# Patient Record
Sex: Female | Born: 1950 | Race: Black or African American | Hispanic: No | Smoking: Former smoker
Health system: Southern US, Community
[De-identification: ages and names within clinical notes are randomized; demographics above are authoritative.]

## PROBLEM LIST (undated history)

## (undated) DIAGNOSIS — N950 Postmenopausal bleeding: Secondary | ICD-10-CM

## (undated) DIAGNOSIS — I639 Cerebral infarction, unspecified: Secondary | ICD-10-CM

## (undated) DIAGNOSIS — E1065 Type 1 diabetes mellitus with hyperglycemia: Secondary | ICD-10-CM

## (undated) DIAGNOSIS — G510 Bell's palsy: Secondary | ICD-10-CM

## (undated) DIAGNOSIS — I10 Essential (primary) hypertension: Secondary | ICD-10-CM

## (undated) DIAGNOSIS — C801 Malignant (primary) neoplasm, unspecified: Secondary | ICD-10-CM

## (undated) DIAGNOSIS — Z21 Asymptomatic human immunodeficiency virus [HIV] infection status: Secondary | ICD-10-CM

## (undated) DIAGNOSIS — F419 Anxiety disorder, unspecified: Secondary | ICD-10-CM

## (undated) DIAGNOSIS — M109 Gout, unspecified: Secondary | ICD-10-CM

## (undated) DIAGNOSIS — K219 Gastro-esophageal reflux disease without esophagitis: Secondary | ICD-10-CM

## (undated) DIAGNOSIS — E063 Autoimmune thyroiditis: Secondary | ICD-10-CM

## (undated) DIAGNOSIS — Z6841 Body Mass Index (BMI) 40.0 and over, adult: Secondary | ICD-10-CM

## (undated) DIAGNOSIS — B20 Human immunodeficiency virus [HIV] disease: Secondary | ICD-10-CM

## (undated) DIAGNOSIS — M1990 Unspecified osteoarthritis, unspecified site: Secondary | ICD-10-CM

## (undated) DIAGNOSIS — E785 Hyperlipidemia, unspecified: Secondary | ICD-10-CM

## (undated) HISTORY — DX: Body Mass Index (BMI) 40.0 and over, adult: Z684

## (undated) HISTORY — DX: Hyperlipidemia, unspecified: E78.5

## (undated) HISTORY — DX: Asymptomatic human immunodeficiency virus (hiv) infection status: Z21

## (undated) HISTORY — DX: Morbid (severe) obesity due to excess calories: E66.01

## (undated) HISTORY — DX: Autoimmune thyroiditis: E06.3

## (undated) HISTORY — DX: Malignant (primary) neoplasm, unspecified: C80.1

## (undated) HISTORY — DX: Essential (primary) hypertension: I10

## (undated) HISTORY — DX: Cerebral infarction, unspecified: I63.9

## (undated) HISTORY — DX: Human immunodeficiency virus (HIV) disease: B20

## (undated) HISTORY — DX: Postmenopausal bleeding: N95.0

## (undated) HISTORY — DX: Type 1 diabetes mellitus with hyperglycemia: E10.65

---

## 1997-12-13 ENCOUNTER — Encounter: Admission: RE | Admit: 1997-12-13 | Discharge: 1997-12-13 | Payer: Self-pay | Admitting: Hematology and Oncology

## 1998-01-26 ENCOUNTER — Encounter: Admission: RE | Admit: 1998-01-26 | Discharge: 1998-01-26 | Payer: Self-pay | Admitting: Internal Medicine

## 1998-03-13 ENCOUNTER — Encounter: Admission: RE | Admit: 1998-03-13 | Discharge: 1998-03-13 | Payer: Self-pay | Admitting: Internal Medicine

## 1998-03-27 ENCOUNTER — Encounter: Admission: RE | Admit: 1998-03-27 | Discharge: 1998-03-27 | Payer: Self-pay | Admitting: Internal Medicine

## 1998-06-27 ENCOUNTER — Encounter: Admission: RE | Admit: 1998-06-27 | Discharge: 1998-06-27 | Payer: Self-pay | Admitting: Internal Medicine

## 1998-06-27 ENCOUNTER — Other Ambulatory Visit: Admission: RE | Admit: 1998-06-27 | Discharge: 1998-06-27 | Payer: Self-pay | Admitting: *Deleted

## 1998-06-27 ENCOUNTER — Ambulatory Visit (HOSPITAL_COMMUNITY): Admission: RE | Admit: 1998-06-27 | Discharge: 1998-06-27 | Payer: Self-pay | Admitting: Internal Medicine

## 1998-08-25 HISTORY — PX: COLONOSCOPY: SHX174

## 1998-11-14 ENCOUNTER — Encounter: Admission: RE | Admit: 1998-11-14 | Discharge: 1998-11-14 | Payer: Self-pay | Admitting: Hematology and Oncology

## 1998-11-14 ENCOUNTER — Encounter (INDEPENDENT_AMBULATORY_CARE_PROVIDER_SITE_OTHER): Payer: Self-pay | Admitting: *Deleted

## 1998-11-14 ENCOUNTER — Ambulatory Visit (HOSPITAL_COMMUNITY): Admission: RE | Admit: 1998-11-14 | Discharge: 1998-11-14 | Payer: Self-pay | Admitting: Hematology and Oncology

## 1998-11-14 LAB — CONVERTED CEMR LAB: CD4 Count: 250 microliters

## 1999-01-30 ENCOUNTER — Encounter: Admission: RE | Admit: 1999-01-30 | Discharge: 1999-01-30 | Payer: Self-pay | Admitting: Internal Medicine

## 1999-03-14 ENCOUNTER — Encounter: Admission: RE | Admit: 1999-03-14 | Discharge: 1999-03-14 | Payer: Self-pay | Admitting: Hematology and Oncology

## 1999-03-14 ENCOUNTER — Ambulatory Visit (HOSPITAL_COMMUNITY): Admission: RE | Admit: 1999-03-14 | Discharge: 1999-03-14 | Payer: Self-pay | Admitting: Hematology and Oncology

## 1999-03-14 ENCOUNTER — Other Ambulatory Visit: Admission: RE | Admit: 1999-03-14 | Discharge: 1999-03-14 | Payer: Self-pay | Admitting: Obstetrics

## 1999-03-28 ENCOUNTER — Encounter: Admission: RE | Admit: 1999-03-28 | Discharge: 1999-03-28 | Payer: Self-pay | Admitting: Hematology and Oncology

## 1999-04-09 ENCOUNTER — Encounter: Admission: RE | Admit: 1999-04-09 | Discharge: 1999-04-09 | Payer: Self-pay | Admitting: Internal Medicine

## 1999-05-02 ENCOUNTER — Encounter: Admission: RE | Admit: 1999-05-02 | Discharge: 1999-05-02 | Payer: Self-pay | Admitting: Internal Medicine

## 1999-06-20 ENCOUNTER — Encounter: Admission: RE | Admit: 1999-06-20 | Discharge: 1999-06-20 | Payer: Self-pay | Admitting: Obstetrics

## 1999-07-30 ENCOUNTER — Encounter: Admission: RE | Admit: 1999-07-30 | Discharge: 1999-07-30 | Payer: Self-pay | Admitting: Internal Medicine

## 1999-07-30 ENCOUNTER — Ambulatory Visit (HOSPITAL_COMMUNITY): Admission: RE | Admit: 1999-07-30 | Discharge: 1999-07-30 | Payer: Self-pay | Admitting: Internal Medicine

## 1999-08-15 ENCOUNTER — Other Ambulatory Visit: Admission: RE | Admit: 1999-08-15 | Discharge: 1999-08-15 | Payer: Self-pay | Admitting: Obstetrics

## 1999-08-15 ENCOUNTER — Encounter: Admission: RE | Admit: 1999-08-15 | Discharge: 1999-08-15 | Payer: Self-pay | Admitting: Obstetrics

## 1999-09-06 ENCOUNTER — Encounter: Admission: RE | Admit: 1999-09-06 | Discharge: 1999-09-06 | Payer: Self-pay | Admitting: Internal Medicine

## 1999-09-18 ENCOUNTER — Encounter: Admission: RE | Admit: 1999-09-18 | Discharge: 1999-09-18 | Payer: Self-pay | Admitting: Internal Medicine

## 1999-11-05 ENCOUNTER — Encounter: Admission: RE | Admit: 1999-11-05 | Discharge: 1999-11-05 | Payer: Self-pay | Admitting: Internal Medicine

## 2000-01-21 ENCOUNTER — Encounter: Admission: RE | Admit: 2000-01-21 | Discharge: 2000-01-21 | Payer: Self-pay | Admitting: Internal Medicine

## 2000-01-21 ENCOUNTER — Ambulatory Visit (HOSPITAL_COMMUNITY): Admission: RE | Admit: 2000-01-21 | Discharge: 2000-01-21 | Payer: Self-pay | Admitting: Internal Medicine

## 2000-01-23 ENCOUNTER — Encounter: Admission: RE | Admit: 2000-01-23 | Discharge: 2000-01-23 | Payer: Self-pay | Admitting: Internal Medicine

## 2000-01-29 ENCOUNTER — Encounter: Payer: Self-pay | Admitting: Gastroenterology

## 2000-01-29 ENCOUNTER — Ambulatory Visit (HOSPITAL_COMMUNITY): Admission: RE | Admit: 2000-01-29 | Discharge: 2000-01-29 | Payer: Self-pay | Admitting: Gastroenterology

## 2000-01-30 ENCOUNTER — Ambulatory Visit (HOSPITAL_COMMUNITY): Admission: RE | Admit: 2000-01-30 | Discharge: 2000-01-30 | Payer: Self-pay | Admitting: Internal Medicine

## 2000-01-30 ENCOUNTER — Encounter: Admission: RE | Admit: 2000-01-30 | Discharge: 2000-01-30 | Payer: Self-pay | Admitting: Internal Medicine

## 2000-02-05 ENCOUNTER — Encounter: Admission: RE | Admit: 2000-02-05 | Discharge: 2000-02-05 | Payer: Self-pay | Admitting: Hematology and Oncology

## 2000-02-18 ENCOUNTER — Encounter: Admission: RE | Admit: 2000-02-18 | Discharge: 2000-02-18 | Payer: Self-pay | Admitting: Internal Medicine

## 2000-03-06 ENCOUNTER — Encounter: Admission: RE | Admit: 2000-03-06 | Discharge: 2000-03-06 | Payer: Self-pay | Admitting: Internal Medicine

## 2000-03-09 ENCOUNTER — Encounter: Admission: RE | Admit: 2000-03-09 | Discharge: 2000-03-09 | Payer: Self-pay | Admitting: Infectious Diseases

## 2000-03-10 ENCOUNTER — Ambulatory Visit (HOSPITAL_COMMUNITY): Admission: RE | Admit: 2000-03-10 | Discharge: 2000-03-10 | Payer: Self-pay | Admitting: Gastroenterology

## 2000-03-10 ENCOUNTER — Encounter: Payer: Self-pay | Admitting: Gastroenterology

## 2000-04-13 ENCOUNTER — Encounter: Admission: RE | Admit: 2000-04-13 | Discharge: 2000-04-13 | Payer: Self-pay | Admitting: Infectious Diseases

## 2000-04-17 ENCOUNTER — Encounter: Admission: RE | Admit: 2000-04-17 | Discharge: 2000-04-17 | Payer: Self-pay | Admitting: Internal Medicine

## 2000-05-15 ENCOUNTER — Encounter: Admission: RE | Admit: 2000-05-15 | Discharge: 2000-05-15 | Payer: Self-pay | Admitting: Internal Medicine

## 2000-05-22 ENCOUNTER — Ambulatory Visit (HOSPITAL_COMMUNITY): Admission: RE | Admit: 2000-05-22 | Discharge: 2000-05-22 | Payer: Self-pay | Admitting: Internal Medicine

## 2000-05-22 ENCOUNTER — Encounter: Admission: RE | Admit: 2000-05-22 | Discharge: 2000-05-22 | Payer: Self-pay | Admitting: Internal Medicine

## 2000-07-13 ENCOUNTER — Encounter: Admission: RE | Admit: 2000-07-13 | Discharge: 2000-07-13 | Payer: Self-pay | Admitting: Internal Medicine

## 2000-08-12 ENCOUNTER — Encounter: Admission: RE | Admit: 2000-08-12 | Discharge: 2000-08-12 | Payer: Self-pay

## 2000-08-14 ENCOUNTER — Encounter: Admission: RE | Admit: 2000-08-14 | Discharge: 2000-08-14 | Payer: Self-pay | Admitting: Internal Medicine

## 2000-09-21 ENCOUNTER — Ambulatory Visit (HOSPITAL_COMMUNITY): Admission: RE | Admit: 2000-09-21 | Discharge: 2000-09-21 | Payer: Self-pay | Admitting: Internal Medicine

## 2000-09-21 ENCOUNTER — Encounter: Admission: RE | Admit: 2000-09-21 | Discharge: 2000-09-21 | Payer: Self-pay | Admitting: Internal Medicine

## 2000-10-09 ENCOUNTER — Encounter: Admission: RE | Admit: 2000-10-09 | Discharge: 2000-10-09 | Payer: Self-pay | Admitting: Internal Medicine

## 2000-10-09 DIAGNOSIS — R87619 Unspecified abnormal cytological findings in specimens from cervix uteri: Secondary | ICD-10-CM

## 2000-10-13 ENCOUNTER — Ambulatory Visit (HOSPITAL_COMMUNITY): Admission: RE | Admit: 2000-10-13 | Discharge: 2000-10-13 | Payer: Self-pay | Admitting: *Deleted

## 2000-10-13 ENCOUNTER — Encounter: Admission: RE | Admit: 2000-10-13 | Discharge: 2000-10-13 | Payer: Self-pay

## 2000-11-24 ENCOUNTER — Encounter: Admission: RE | Admit: 2000-11-24 | Discharge: 2000-11-24 | Payer: Self-pay | Admitting: Hematology and Oncology

## 2001-09-17 ENCOUNTER — Encounter: Admission: RE | Admit: 2001-09-17 | Discharge: 2001-09-17 | Payer: Self-pay

## 2001-10-14 ENCOUNTER — Encounter: Admission: RE | Admit: 2001-10-14 | Discharge: 2001-10-14 | Payer: Self-pay | Admitting: Internal Medicine

## 2001-10-14 ENCOUNTER — Ambulatory Visit (HOSPITAL_COMMUNITY): Admission: RE | Admit: 2001-10-14 | Discharge: 2001-10-14 | Payer: Self-pay | Admitting: Internal Medicine

## 2001-11-11 ENCOUNTER — Encounter: Admission: RE | Admit: 2001-11-11 | Discharge: 2001-11-11 | Payer: Self-pay | Admitting: Internal Medicine

## 2001-12-20 ENCOUNTER — Encounter: Admission: RE | Admit: 2001-12-20 | Discharge: 2001-12-20 | Payer: Self-pay

## 2002-02-15 ENCOUNTER — Encounter: Admission: RE | Admit: 2002-02-15 | Discharge: 2002-02-15 | Payer: Self-pay | Admitting: Internal Medicine

## 2002-02-15 ENCOUNTER — Ambulatory Visit (HOSPITAL_COMMUNITY): Admission: RE | Admit: 2002-02-15 | Discharge: 2002-02-15 | Payer: Self-pay | Admitting: Internal Medicine

## 2002-04-29 ENCOUNTER — Encounter: Admission: RE | Admit: 2002-04-29 | Discharge: 2002-04-29 | Payer: Self-pay | Admitting: Internal Medicine

## 2002-04-29 ENCOUNTER — Ambulatory Visit (HOSPITAL_COMMUNITY): Admission: RE | Admit: 2002-04-29 | Discharge: 2002-04-29 | Payer: Self-pay | Admitting: Internal Medicine

## 2002-05-10 ENCOUNTER — Encounter: Payer: Self-pay | Admitting: Internal Medicine

## 2002-05-10 ENCOUNTER — Ambulatory Visit (HOSPITAL_COMMUNITY): Admission: RE | Admit: 2002-05-10 | Discharge: 2002-05-10 | Payer: Self-pay | Admitting: Internal Medicine

## 2002-05-17 ENCOUNTER — Encounter: Admission: RE | Admit: 2002-05-17 | Discharge: 2002-05-17 | Payer: Self-pay | Admitting: Internal Medicine

## 2002-06-22 ENCOUNTER — Encounter: Admission: RE | Admit: 2002-06-22 | Discharge: 2002-06-22 | Payer: Self-pay | Admitting: Internal Medicine

## 2002-07-06 ENCOUNTER — Encounter: Admission: RE | Admit: 2002-07-06 | Discharge: 2002-07-06 | Payer: Self-pay | Admitting: Internal Medicine

## 2002-07-06 ENCOUNTER — Ambulatory Visit (HOSPITAL_COMMUNITY): Admission: RE | Admit: 2002-07-06 | Discharge: 2002-07-06 | Payer: Self-pay | Admitting: Internal Medicine

## 2002-09-05 ENCOUNTER — Encounter: Admission: RE | Admit: 2002-09-05 | Discharge: 2002-09-05 | Payer: Self-pay | Admitting: Internal Medicine

## 2002-11-14 ENCOUNTER — Encounter: Admission: RE | Admit: 2002-11-14 | Discharge: 2002-11-14 | Payer: Self-pay | Admitting: Internal Medicine

## 2002-12-15 ENCOUNTER — Encounter (INDEPENDENT_AMBULATORY_CARE_PROVIDER_SITE_OTHER): Payer: Self-pay | Admitting: Specialist

## 2002-12-15 ENCOUNTER — Encounter: Admission: RE | Admit: 2002-12-15 | Discharge: 2002-12-15 | Payer: Self-pay | Admitting: Internal Medicine

## 2003-01-20 ENCOUNTER — Encounter: Admission: RE | Admit: 2003-01-20 | Discharge: 2003-01-20 | Payer: Self-pay | Admitting: Internal Medicine

## 2003-02-28 ENCOUNTER — Encounter: Admission: RE | Admit: 2003-02-28 | Discharge: 2003-02-28 | Payer: Self-pay | Admitting: Internal Medicine

## 2003-03-16 ENCOUNTER — Encounter: Admission: RE | Admit: 2003-03-16 | Discharge: 2003-03-16 | Payer: Self-pay | Admitting: Internal Medicine

## 2003-04-06 ENCOUNTER — Encounter (INDEPENDENT_AMBULATORY_CARE_PROVIDER_SITE_OTHER): Payer: Self-pay | Admitting: Internal Medicine

## 2003-04-06 ENCOUNTER — Ambulatory Visit (HOSPITAL_COMMUNITY): Admission: RE | Admit: 2003-04-06 | Discharge: 2003-04-06 | Payer: Self-pay | Admitting: Internal Medicine

## 2003-04-06 ENCOUNTER — Encounter: Admission: RE | Admit: 2003-04-06 | Discharge: 2003-04-06 | Payer: Self-pay | Admitting: Internal Medicine

## 2003-05-24 ENCOUNTER — Ambulatory Visit (HOSPITAL_COMMUNITY): Admission: RE | Admit: 2003-05-24 | Discharge: 2003-05-24 | Payer: Self-pay | Admitting: Internal Medicine

## 2003-05-24 ENCOUNTER — Encounter: Admission: RE | Admit: 2003-05-24 | Discharge: 2003-05-24 | Payer: Self-pay | Admitting: Internal Medicine

## 2003-06-12 ENCOUNTER — Encounter: Admission: RE | Admit: 2003-06-12 | Discharge: 2003-06-12 | Payer: Self-pay | Admitting: Internal Medicine

## 2003-07-11 ENCOUNTER — Encounter: Admission: RE | Admit: 2003-07-11 | Discharge: 2003-07-11 | Payer: Self-pay | Admitting: Internal Medicine

## 2003-10-30 ENCOUNTER — Ambulatory Visit (HOSPITAL_COMMUNITY): Admission: RE | Admit: 2003-10-30 | Discharge: 2003-10-30 | Payer: Self-pay | Admitting: Internal Medicine

## 2003-10-30 ENCOUNTER — Encounter: Admission: RE | Admit: 2003-10-30 | Discharge: 2003-10-30 | Payer: Self-pay | Admitting: Internal Medicine

## 2003-11-15 ENCOUNTER — Encounter: Admission: RE | Admit: 2003-11-15 | Discharge: 2003-11-15 | Payer: Self-pay | Admitting: Internal Medicine

## 2003-11-21 ENCOUNTER — Encounter: Admission: RE | Admit: 2003-11-21 | Discharge: 2003-11-21 | Payer: Self-pay | Admitting: Obstetrics and Gynecology

## 2003-11-21 ENCOUNTER — Other Ambulatory Visit: Admission: RE | Admit: 2003-11-21 | Discharge: 2003-11-21 | Payer: Self-pay | Admitting: Obstetrics and Gynecology

## 2003-11-21 ENCOUNTER — Encounter (INDEPENDENT_AMBULATORY_CARE_PROVIDER_SITE_OTHER): Payer: Self-pay | Admitting: Specialist

## 2003-11-27 ENCOUNTER — Encounter: Admission: RE | Admit: 2003-11-27 | Discharge: 2004-01-08 | Payer: Self-pay | Admitting: Internal Medicine

## 2003-12-14 ENCOUNTER — Encounter: Admission: RE | Admit: 2003-12-14 | Discharge: 2003-12-14 | Payer: Self-pay | Admitting: Internal Medicine

## 2003-12-14 ENCOUNTER — Ambulatory Visit (HOSPITAL_COMMUNITY): Admission: RE | Admit: 2003-12-14 | Discharge: 2003-12-14 | Payer: Self-pay | Admitting: Internal Medicine

## 2004-01-09 ENCOUNTER — Encounter: Admission: RE | Admit: 2004-01-09 | Discharge: 2004-01-09 | Payer: Self-pay | Admitting: Internal Medicine

## 2004-02-21 ENCOUNTER — Ambulatory Visit (HOSPITAL_COMMUNITY): Admission: RE | Admit: 2004-02-21 | Discharge: 2004-02-21 | Payer: Self-pay | Admitting: Internal Medicine

## 2004-02-21 ENCOUNTER — Encounter: Admission: RE | Admit: 2004-02-21 | Discharge: 2004-02-21 | Payer: Self-pay | Admitting: Internal Medicine

## 2004-04-18 ENCOUNTER — Encounter: Admission: RE | Admit: 2004-04-18 | Discharge: 2004-04-18 | Payer: Self-pay | Admitting: Internal Medicine

## 2004-05-08 ENCOUNTER — Encounter (INDEPENDENT_AMBULATORY_CARE_PROVIDER_SITE_OTHER): Payer: Self-pay | Admitting: *Deleted

## 2004-05-08 ENCOUNTER — Ambulatory Visit: Payer: Self-pay | Admitting: Infectious Diseases

## 2004-05-08 ENCOUNTER — Ambulatory Visit (HOSPITAL_COMMUNITY): Admission: RE | Admit: 2004-05-08 | Discharge: 2004-05-08 | Payer: Self-pay | Admitting: Infectious Diseases

## 2004-05-08 LAB — CONVERTED CEMR LAB
CD4 Count: 30 microliters
HIV 1 RNA Quant: 131000 copies/mL

## 2004-06-24 ENCOUNTER — Ambulatory Visit: Payer: Self-pay | Admitting: Infectious Diseases

## 2004-08-30 ENCOUNTER — Ambulatory Visit: Payer: Self-pay | Admitting: Gastroenterology

## 2004-09-04 ENCOUNTER — Encounter (INDEPENDENT_AMBULATORY_CARE_PROVIDER_SITE_OTHER): Payer: Self-pay | Admitting: *Deleted

## 2004-09-04 ENCOUNTER — Ambulatory Visit: Payer: Self-pay | Admitting: Gastroenterology

## 2004-09-04 ENCOUNTER — Encounter (INDEPENDENT_AMBULATORY_CARE_PROVIDER_SITE_OTHER): Payer: Self-pay | Admitting: Specialist

## 2004-09-04 ENCOUNTER — Ambulatory Visit (HOSPITAL_COMMUNITY): Admission: RE | Admit: 2004-09-04 | Discharge: 2004-09-04 | Payer: Self-pay | Admitting: Gastroenterology

## 2005-02-28 ENCOUNTER — Ambulatory Visit: Payer: Self-pay | Admitting: Internal Medicine

## 2005-03-04 ENCOUNTER — Ambulatory Visit: Payer: Self-pay | Admitting: Internal Medicine

## 2005-03-04 ENCOUNTER — Ambulatory Visit (HOSPITAL_COMMUNITY): Admission: RE | Admit: 2005-03-04 | Discharge: 2005-03-04 | Payer: Self-pay | Admitting: Internal Medicine

## 2005-04-30 ENCOUNTER — Ambulatory Visit: Payer: Self-pay | Admitting: Infectious Diseases

## 2005-05-26 ENCOUNTER — Ambulatory Visit: Payer: Self-pay | Admitting: Infectious Diseases

## 2005-05-29 ENCOUNTER — Ambulatory Visit: Payer: Self-pay | Admitting: *Deleted

## 2005-05-29 ENCOUNTER — Encounter (INDEPENDENT_AMBULATORY_CARE_PROVIDER_SITE_OTHER): Payer: Self-pay | Admitting: Specialist

## 2005-06-06 ENCOUNTER — Ambulatory Visit (HOSPITAL_COMMUNITY): Admission: RE | Admit: 2005-06-06 | Discharge: 2005-06-06 | Payer: Self-pay | Admitting: Internal Medicine

## 2005-06-18 ENCOUNTER — Encounter: Admission: RE | Admit: 2005-06-18 | Discharge: 2005-06-18 | Payer: Self-pay | Admitting: Family Medicine

## 2005-06-30 ENCOUNTER — Other Ambulatory Visit: Admission: RE | Admit: 2005-06-30 | Discharge: 2005-06-30 | Payer: Self-pay | Admitting: *Deleted

## 2005-06-30 ENCOUNTER — Encounter (INDEPENDENT_AMBULATORY_CARE_PROVIDER_SITE_OTHER): Payer: Self-pay | Admitting: Specialist

## 2005-07-01 ENCOUNTER — Ambulatory Visit: Payer: Self-pay | Admitting: *Deleted

## 2005-09-02 ENCOUNTER — Ambulatory Visit: Payer: Self-pay | Admitting: Obstetrics and Gynecology

## 2005-09-24 ENCOUNTER — Ambulatory Visit (HOSPITAL_COMMUNITY): Admission: RE | Admit: 2005-09-24 | Discharge: 2005-09-24 | Payer: Self-pay | Admitting: Obstetrics and Gynecology

## 2005-09-24 ENCOUNTER — Encounter (INDEPENDENT_AMBULATORY_CARE_PROVIDER_SITE_OTHER): Payer: Self-pay | Admitting: Specialist

## 2005-09-24 ENCOUNTER — Ambulatory Visit: Payer: Self-pay | Admitting: Obstetrics and Gynecology

## 2005-10-29 ENCOUNTER — Ambulatory Visit: Payer: Self-pay | Admitting: Obstetrics and Gynecology

## 2005-11-14 ENCOUNTER — Emergency Department (HOSPITAL_COMMUNITY): Admission: EM | Admit: 2005-11-14 | Discharge: 2005-11-15 | Payer: Self-pay | Admitting: Emergency Medicine

## 2005-11-18 ENCOUNTER — Ambulatory Visit: Payer: Self-pay | Admitting: Infectious Diseases

## 2005-12-17 ENCOUNTER — Ambulatory Visit: Payer: Self-pay | Admitting: Family Medicine

## 2006-04-22 ENCOUNTER — Encounter (INDEPENDENT_AMBULATORY_CARE_PROVIDER_SITE_OTHER): Payer: Self-pay | Admitting: Specialist

## 2006-04-22 ENCOUNTER — Ambulatory Visit: Payer: Self-pay | Admitting: Obstetrics and Gynecology

## 2006-06-11 ENCOUNTER — Ambulatory Visit (HOSPITAL_COMMUNITY): Admission: RE | Admit: 2006-06-11 | Discharge: 2006-06-11 | Payer: Self-pay | Admitting: Obstetrics and Gynecology

## 2006-09-08 DIAGNOSIS — N83209 Unspecified ovarian cyst, unspecified side: Secondary | ICD-10-CM

## 2006-09-08 DIAGNOSIS — B20 Human immunodeficiency virus [HIV] disease: Secondary | ICD-10-CM | POA: Insufficient documentation

## 2006-09-08 DIAGNOSIS — G609 Hereditary and idiopathic neuropathy, unspecified: Secondary | ICD-10-CM | POA: Insufficient documentation

## 2006-09-08 DIAGNOSIS — K210 Gastro-esophageal reflux disease with esophagitis: Secondary | ICD-10-CM

## 2006-09-08 DIAGNOSIS — R599 Enlarged lymph nodes, unspecified: Secondary | ICD-10-CM | POA: Insufficient documentation

## 2006-09-08 DIAGNOSIS — I10 Essential (primary) hypertension: Secondary | ICD-10-CM

## 2006-09-08 DIAGNOSIS — L738 Other specified follicular disorders: Secondary | ICD-10-CM | POA: Insufficient documentation

## 2006-09-08 DIAGNOSIS — N879 Dysplasia of cervix uteri, unspecified: Secondary | ICD-10-CM | POA: Insufficient documentation

## 2006-09-08 DIAGNOSIS — B373 Candidiasis of vulva and vagina: Secondary | ICD-10-CM

## 2006-09-08 DIAGNOSIS — D219 Benign neoplasm of connective and other soft tissue, unspecified: Secondary | ICD-10-CM | POA: Insufficient documentation

## 2006-09-17 ENCOUNTER — Encounter (INDEPENDENT_AMBULATORY_CARE_PROVIDER_SITE_OTHER): Payer: Self-pay | Admitting: Infectious Diseases

## 2006-10-19 ENCOUNTER — Encounter (INDEPENDENT_AMBULATORY_CARE_PROVIDER_SITE_OTHER): Payer: Self-pay | Admitting: *Deleted

## 2006-10-19 LAB — CONVERTED CEMR LAB: Pap Smear: ABNORMAL

## 2006-11-01 ENCOUNTER — Encounter (INDEPENDENT_AMBULATORY_CARE_PROVIDER_SITE_OTHER): Payer: Self-pay | Admitting: *Deleted

## 2007-01-04 ENCOUNTER — Ambulatory Visit: Payer: Self-pay | Admitting: Orthopedic Surgery

## 2007-03-24 ENCOUNTER — Ambulatory Visit: Payer: Self-pay | Admitting: Vascular Surgery

## 2007-03-24 ENCOUNTER — Ambulatory Visit (HOSPITAL_COMMUNITY): Admission: RE | Admit: 2007-03-24 | Discharge: 2007-03-24 | Payer: Self-pay | Admitting: Orthopedic Surgery

## 2007-03-24 ENCOUNTER — Encounter (INDEPENDENT_AMBULATORY_CARE_PROVIDER_SITE_OTHER): Payer: Self-pay | Admitting: Orthopedic Surgery

## 2007-04-06 ENCOUNTER — Encounter (INDEPENDENT_AMBULATORY_CARE_PROVIDER_SITE_OTHER): Payer: Self-pay | Admitting: Orthopedic Surgery

## 2007-04-06 ENCOUNTER — Ambulatory Visit (HOSPITAL_COMMUNITY): Admission: RE | Admit: 2007-04-06 | Discharge: 2007-04-06 | Payer: Self-pay | Admitting: Orthopedic Surgery

## 2007-04-19 ENCOUNTER — Encounter (HOSPITAL_COMMUNITY): Admission: RE | Admit: 2007-04-19 | Discharge: 2007-05-19 | Payer: Self-pay | Admitting: Orthopedic Surgery

## 2007-06-14 ENCOUNTER — Ambulatory Visit (HOSPITAL_COMMUNITY): Admission: RE | Admit: 2007-06-14 | Discharge: 2007-06-14 | Payer: Self-pay | Admitting: *Deleted

## 2007-06-24 ENCOUNTER — Ambulatory Visit: Payer: Self-pay | Admitting: Obstetrics & Gynecology

## 2007-06-24 ENCOUNTER — Encounter: Payer: Self-pay | Admitting: Obstetrics & Gynecology

## 2007-06-30 ENCOUNTER — Ambulatory Visit: Payer: Self-pay | Admitting: Obstetrics and Gynecology

## 2007-06-30 ENCOUNTER — Ambulatory Visit (HOSPITAL_COMMUNITY): Admission: RE | Admit: 2007-06-30 | Discharge: 2007-06-30 | Payer: Self-pay | Admitting: Family Medicine

## 2007-07-05 ENCOUNTER — Ambulatory Visit (HOSPITAL_COMMUNITY): Admission: RE | Admit: 2007-07-05 | Discharge: 2007-07-05 | Payer: Self-pay | Admitting: Obstetrics and Gynecology

## 2007-07-05 ENCOUNTER — Ambulatory Visit: Payer: Self-pay | Admitting: Obstetrics and Gynecology

## 2007-07-05 ENCOUNTER — Encounter: Payer: Self-pay | Admitting: Obstetrics and Gynecology

## 2007-07-14 ENCOUNTER — Ambulatory Visit: Payer: Self-pay | Admitting: *Deleted

## 2007-07-14 ENCOUNTER — Ambulatory Visit: Admission: RE | Admit: 2007-07-14 | Discharge: 2007-07-14 | Payer: Self-pay | Admitting: Gynecologic Oncology

## 2007-08-10 ENCOUNTER — Ambulatory Visit (HOSPITAL_COMMUNITY): Admission: RE | Admit: 2007-08-10 | Discharge: 2007-08-10 | Payer: Self-pay | Admitting: Gynecology

## 2007-08-10 ENCOUNTER — Encounter: Payer: Self-pay | Admitting: Gynecology

## 2007-09-09 ENCOUNTER — Ambulatory Visit: Payer: Self-pay | Admitting: Family Medicine

## 2007-10-12 ENCOUNTER — Ambulatory Visit: Admission: RE | Admit: 2007-10-12 | Discharge: 2007-10-12 | Payer: Self-pay | Admitting: Gynecologic Oncology

## 2007-11-03 ENCOUNTER — Ambulatory Visit: Payer: Self-pay | Admitting: Gynecology

## 2007-11-03 ENCOUNTER — Other Ambulatory Visit: Admission: RE | Admit: 2007-11-03 | Discharge: 2007-11-03 | Payer: Self-pay | Admitting: Obstetrics & Gynecology

## 2007-11-17 ENCOUNTER — Ambulatory Visit: Payer: Self-pay | Admitting: Obstetrics & Gynecology

## 2007-11-19 ENCOUNTER — Ambulatory Visit (HOSPITAL_COMMUNITY): Admission: RE | Admit: 2007-11-19 | Discharge: 2007-11-19 | Payer: Self-pay | Admitting: Obstetrics & Gynecology

## 2008-02-03 ENCOUNTER — Ambulatory Visit: Payer: Self-pay | Admitting: Gynecology

## 2008-02-09 ENCOUNTER — Ambulatory Visit: Admission: RE | Admit: 2008-02-09 | Discharge: 2008-02-09 | Payer: Self-pay | Admitting: Gynecologic Oncology

## 2008-03-23 ENCOUNTER — Telehealth (INDEPENDENT_AMBULATORY_CARE_PROVIDER_SITE_OTHER): Payer: Self-pay | Admitting: *Deleted

## 2008-06-21 ENCOUNTER — Ambulatory Visit: Payer: Self-pay | Admitting: Obstetrics & Gynecology

## 2008-06-21 ENCOUNTER — Encounter (INDEPENDENT_AMBULATORY_CARE_PROVIDER_SITE_OTHER): Payer: Self-pay | Admitting: Obstetrics & Gynecology

## 2008-07-05 ENCOUNTER — Ambulatory Visit (HOSPITAL_COMMUNITY): Admission: RE | Admit: 2008-07-05 | Discharge: 2008-07-05 | Payer: Self-pay | Admitting: Family Medicine

## 2008-08-02 ENCOUNTER — Other Ambulatory Visit: Admission: RE | Admit: 2008-08-02 | Discharge: 2008-08-02 | Payer: Self-pay | Admitting: Obstetrics and Gynecology

## 2008-08-02 ENCOUNTER — Ambulatory Visit: Payer: Self-pay | Admitting: Obstetrics & Gynecology

## 2008-08-09 ENCOUNTER — Ambulatory Visit: Admission: RE | Admit: 2008-08-09 | Discharge: 2008-08-09 | Payer: Self-pay | Admitting: Gynecologic Oncology

## 2009-03-01 ENCOUNTER — Ambulatory Visit: Admission: RE | Admit: 2009-03-01 | Discharge: 2009-03-01 | Payer: Self-pay | Admitting: Gynecologic Oncology

## 2009-03-01 ENCOUNTER — Encounter: Payer: Self-pay | Admitting: Gynecologic Oncology

## 2009-03-14 ENCOUNTER — Ambulatory Visit: Payer: Self-pay | Admitting: Obstetrics and Gynecology

## 2009-03-14 ENCOUNTER — Other Ambulatory Visit: Admission: RE | Admit: 2009-03-14 | Discharge: 2009-03-14 | Payer: Self-pay | Admitting: Obstetrics and Gynecology

## 2009-03-14 ENCOUNTER — Encounter: Payer: Self-pay | Admitting: Obstetrics and Gynecology

## 2009-03-28 ENCOUNTER — Ambulatory Visit: Payer: Self-pay | Admitting: Obstetrics & Gynecology

## 2009-04-12 ENCOUNTER — Ambulatory Visit: Payer: Self-pay | Admitting: Obstetrics and Gynecology

## 2010-03-01 ENCOUNTER — Ambulatory Visit: Payer: Self-pay | Admitting: Cardiology

## 2010-03-28 ENCOUNTER — Ambulatory Visit: Admission: RE | Admit: 2010-03-28 | Discharge: 2010-03-28 | Payer: Self-pay | Admitting: Gynecologic Oncology

## 2010-09-14 ENCOUNTER — Encounter: Payer: Self-pay | Admitting: Gastroenterology

## 2010-09-15 ENCOUNTER — Encounter: Payer: Self-pay | Admitting: Family Medicine

## 2011-01-07 NOTE — Consult Note (Signed)
NAME:  Laura Jimenez, Laura Jimenez              ACCOUNT NO.:  [id number]   MEDICAL RECORD NO.:  [id number]          PATIENT TYPE:  WOC   LOCATION:  WOC                          FACILITY:  [facility]   PHYSICIAN:  Paola A. Duard Brady, MD    DATE OF BIRTH:  [birth year]   DATE OF CONSULTATION:  10/12/2007  DATE OF DISCHARGE:  09/09/2007                                 CONSULTATION   HISTORY OF PRESENT ILLNESS:  [name] is a very pleasant 60 year old  who was referred to [name] for recurrent vulvar lesion.  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  She underwent a wide local excision by  Dr. Okey Dupre in November 2008 which revealed multifocal stromal  microinvasive disease with positive lateral margins of the deepest  extent being 0.1 cm.  There is no lymphovascular space involvement.  The  lateral margins were free.  We initially saw her in November 2008.  Her  other medical problems include morbid obesity, HIV, low grade Pap  smears, postmenopausal bleeding for which she had a D&C at the time of  her wide local excision that was negative, cutaneous lupus  erythematosus, carcinoma in situ of the peritoneum.  She is otherwise  doing quite well and denies any current complaints.   REVIEW OF SYSTEMS:  She denies any chest pain short of breath, vaginal  bleeding, change in bowel or bladder habits.  She had complaints of  lower extremity edema and her medications are being evaluated and  adjusted.  She apparently, though we have no report, did have a low  grade Pap smear again.  She has a colposcopy scheduled with Dr. Okey Dupre in  the near future.   ALLERGIES:  Keflex, penicillin which causes itching and swelling,  adhesive tape which causes swelling.   MEDICATIONS:  Norvasc, hydrochlorothiazide, Valtrex, Norvir and  Prezista.   PHYSICAL EXAMINATION:  GENERAL:  Well-nourished, alert female in no  acute distress.  NECK:  Supple with no lymphadenopathy, no thyromegaly.  LUNGS:  Clear to auscultation  bilaterally.  CARDIOVASCULAR:  Regular rate and rhythm.  ABDOMEN:  Obese, soft, nontender, nondistended.  No palpable masses or  positive megaly.  Groins are negative for adenopathy.  EXTREMITIES:  She has 2+ pitting edema on the dorsum of her feet equal  bilaterally.  There is no more superior edema than that.  PELVIC:  External genitalia is notable for surgical excision of the  right vulva.  There is a small erythematous rim which is most consistent  with scar and some granulation tissue from her prior superficial  separation.  There is postoperative changes on the right vulva.  There  is no obvious evidence of recurrent disease.  Bimanual examination of  the cervix is palpably normal, though exam is limited by habitus.  The  corpus does not appear markedly enlarged.   ASSESSMENT:  This is a 60 year old with HIV positive with a stage I  vulvar cancer with no evidence of recurrent disease.  She has, however,  had abnormal Pap smears and has a colposcopy scheduled with Dr. Okey Dupre.   PLAN:  1. Her daughter, Laura Jimenez,  saw the lesion today that we are looking at on      a small area of erythema.  She will keep an eye on this for a week      or so and if it appears to be expanding or if there is any      bleeding, they are to contact us immediately.  2. She will follow up with her primary care physicians regarding her      lower extremity edema.  She will return to see [name] in 3-4 months.      She was given an appointment for follow-up.  She was given a card      with followup appointment for June.      Paola A. Duard Brady, MD  Electronically Signed     PAG/MEDQ  D:  10/12/2007  T:  10/13/2007  Job:  [id number]   cc:   Michele Mcalpine D. Okey Dupre, M.D.   Telford Nab, R.N.  [address]  [place], [place] [address]   Dr. Richrd Prime

## 2011-01-07 NOTE — Group Therapy Note (Signed)
NAME:  WILSON, SAMPLE NO.:  [id number]   MEDICAL RECORD NO.:  [id number]          PATIENT TYPE:  WOC   LOCATION:  [facility]                   FACILITY:  [facility]   PHYSICIAN:  Allie Bossier, MD        DATE OF BIRTH:  1950-12-05   DATE OF SERVICE:  06/24/2007                                  CLINIC NOTE   [name] is a 60 year old widowed black female gravida 3, para 2,  abortus 1 who had a diagnosis of HIV.  She was treated with a wide local  excision in January 2007, for a recurring ulcerative vulvar lesion.  This came back as carcinoma in situ with clear margins.  She was suppose  to visit every three months.  This did not happen exactly in that  fashion. Today, she has a complaint of postmenopausal bleeding for the  last few weeks.  Prior to this bleeding episode, she has been  amenorrheic for the last six years.   PAST MEDICAL HISTORY:  1. Morbid obesity.  2. HIV.  3. 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  4. Postmenopausal bleeding.  5. Cutaneous lupus erythematosus.  6. 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   ALLERGIES:  KEFLEX, PENICILLIN, no latex allergies.   SOCIAL HISTORY:  Negative for tobacco, alcohol or drug use.   FAMILY HISTORY:  Negative for breast, GYN and colon malignancies.   REVIEW OF SYSTEMS:  She is abstinent.  She reports that her mammogram  this month was negative.   MEDICATIONS:  1. Norvasc 10 mg.  2. Hydrochlorothiazide daily.  3. Valtrex daily.  4. Norvir b.i.d.  5. Sentress 400 mg b.i.d.  6. Prezista 600 mg p.o. b.i.d.   PHYSICAL EXAM:  GENERAL APPEARANCE:  Obese lady in no apparent distress.  VITAL SIGNS:  Blood pressure 159/84, weight 230 pounds, height 5 feet 5.  HEENT:  Her skin of her face has a somewhat reddish discoloration, but  is not consistent with the rest of her skin coloring. BREASTS:  Large  pendulous breasts without masses.  ABDOMINAL:  She has large pannus with no hepatosplenomegaly appreciated.  EXTERNAL  GENITALIA:  She has a fungating lesion from the right side of  her vulva.  This is almost certainly cancer.  Her cervix appears normal  and a Pap smear was obtained.   ASSESSMENT/PLAN:  1. Annual exam.  I have checked a Pap smear and will get the results      of her mammogram.  2. Postmenopausal bleeding.  I scheduled the GYN ultrasound.  3. Fungating vulvar lesion.  I spoke with the office of Dr. De Blanch, who recommended biopsy as soon as possible prior to      her appointment there.  Her appointment there will be scheduled.      She refuses a biopsy today and is adamant that she will come back      when she is given some Ativan pre-biopsy.  I have agreed to this      and written her a prescription for Valium and expect that she will  be here within the next week for a vulvar biopsy.  All questions      answered.      Allie Bossier, MD     MCD/MEDQ  D:  06/24/2007  T:  06/25/2007  Job:  [id number]

## 2011-01-07 NOTE — Op Note (Signed)
NAME:  Laura Jimenez, Laura Jimenez NO.:  [id number]   MEDICAL RECORD NO.:  [id number]          PATIENT TYPE:  AMB   LOCATION:  DAY                          FACILITY:  [facility]   PHYSICIAN:  De Blanch, M.D.DATE OF BIRTH:  22-Apr-1951   DATE OF PROCEDURE:  08/10/2007  DATE OF DISCHARGE:                               OPERATIVE REPORT   PREOPERATIVE DIAGNOSIS:  Stage I vulvar carcinoma, perineal lesion.   POSTOPERATIVE DIAGNOSIS:  Stage I vulvar carcinoma, perineal lesion.   PROCEDURE:  Modified posterior and right radical vulvectomy, excision of  perineal lesion.   SURGEON:  De Blanch, M.D.   ASSISTANT:  Telford Nab, R.N.   ANESTHESIA:  General with endotracheal tube.   DISPOSITION OF THE SPECIMEN:  To the lab.   SURGICAL FINDINGS:  Examination under anesthesia revealed a 1 cm white  lesion on the perineum.  The area of prior excisional biopsy that was  near the introitus, there were few sutures remaining. There was an  ulcerated area at the introitus at 6 o'clock as well as a nodular area  slightly more towards 9 o'clock.  The excision extended just to the  right of the urethra in order to achieve a surgical margin.  Rectal exam  was performed during the procedure and make sure that no rectal injury  had occurred.   DESCRIPTION OF PROCEDURE:  The patient was brought to the operating  room. After satisfactory attainment of general anesthesia, was placed in  a modified lithotomy position in [place] stirrups.  The perineum, vagina,  and vulva were prepped with Betadine and the patient was draped.  Acetic  acid was placed on the vulva.  The white area of the perineum was  identified and excised, removing a small amount of subcutaneous fat.  This was closed with interrupted 3-0 Vicryl sutures.  Attention was then  turned to the larger invasive lesion at the introitus.  An incision was  made across the perineum and approximately 2/3 of the way up the  right  labia minor.  This extended medially to the lateral aspect of the  urethra and then down the distal vagina across the midline, again,  connecting with perineal incision just to the left of the introitus.  Once the extent of the excision was outlined, cautery was used to  undermine the tissue achieving a modified radical vulvectomy.  The  specimen was oriented on cork.  During the procedure, when the  dissection was performed overlying the rectum, a rectal finger was used  to identify the rectum and make certain there was no injury.  Hemostasis  in the bulbocavernosus and pudendal artery was achieved with interrupted  figure-of-eight sutures of 2-0 Vicryl.  The distal vagina was brought  down to approximate with the perineum just above the anus and the mucosa  and submucosa closed with interrupted 2-0 Vicryl sutures.  The remainder  of the  vulva re-connected to the distal vagina with interrupted 2-0 Vicryl  sutures.  At the conclusion of the surgery, the urethra was again  identified.  The patient was awakened from anesthesia and taken to  the  recovery room in satisfactory condition.  Sponge, needle and instrument  counts were correct x2.      De Blanch, M.D.  Electronically Signed     DC/MEDQ  D:  08/10/2007  T:  08/10/2007  Job:  [id number]   cc:   Allie Bossier, MD

## 2011-01-07 NOTE — Group Therapy Note (Signed)
NAME:  Laura Jimenez, REDMANN NO.:  [id number]   MEDICAL RECORD NO.:  [id number]          PATIENT TYPE:  WOC   LOCATION[facility]  [facility]                   FACILITY:  [facility]   PHYSICIAN:  Argentina Donovan, MD        DATE OF BIRTH:  10-19-1950   DATE OF SERVICE:  03/14/2009                                  CLINIC NOTE   The patient is a 60 year old African American female who is in because  of episode of postmenopausal bleeding following 5 years of menopause.  She is certainly at high risk for endometrial hyperplasia, weighing 245  pounds and is 5 feet 5 inches tall and a blood pressure 174/96.  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  She also had an atypical Pap smear in October 2009 and  had a negative colposcopy and biopsy.  Today, we did a repeat Pap smear  and an endometrial biopsy, where the uterus was sounded to 8 cm and this  was done without incident.  The patient returned in 2 weeks for results  of the Pap smear and biopsy.   IMPRESSION:  Postmenopausal bleeding and previous atypical Pap smear.           ______________________________  Argentina Donovan, MD     PR/MEDQ  D:  03/14/2009  T:  03/15/2009  Job:  [id number]

## 2011-01-07 NOTE — Group Therapy Note (Signed)
NAME:  Laura Jimenez, NOFSINGER NO.:  [id number]   MEDICAL RECORD NO.:  [id number]          PATIENT TYPE:  WOC   LOCATION:  [facility]                   FACILITY:  [facility]   PHYSICIAN:  Tinnie Gens, MD        DATE OF BIRTH:  Feb 21, 1951   DATE OF SERVICE:  09/09/2007                                  CLINIC NOTE   CHIEF COMPLAINT:  Followup.   HISTORY OF PRESENT ILLNESS:  The patient is a 60 year old, African-  American female with HIV disease, who has a history of abnormal Pap and  squamous cell carcinoma of the vulva.  She underwent a wide local  excision of this in December.  Her pathology is reviewed and does not  show frank cancer, but early superficial invasion cannot be entirely  excluded.  I do not see any lymph nodes on this biopsy.  She also has a  history of low-grade SIL on her last Pap and this has not been followed  up with colposcopy.  The patient reports that she is feeling well from  her previous surgery.  She has a followup appointment with Dr. Sandra Cockayne-  Pearson next week.   PHYSICAL EXAMINATION:  VITAL SIGNS:  As noted in the chart.  GENERAL:  She is a moderately obese female in no acute distress.  ABDOMEN:  Soft, nontender, and nondistended.   IMPRESSION:  1. Squamous cell carcinoma of the vulva.  2. Human immunodeficiency virus (HIV) disease.  3. Abnormal Pap smear.   PLAN:  1. Follow up with Dr. Darlyn Read and more followup per his      request.  2. For her low-grade SIL, we will schedule a colposcopy in March of      2009 and will continue Pap smears based on whatever is found there.           ______________________________  Tinnie Gens, MD     TP/MEDQ  D:  09/09/2007  T:  09/09/2007  Job:  [id number]

## 2011-01-07 NOTE — Op Note (Signed)
NAME:  Laura Jimenez, Laura Jimenez              ACCOUNT NO.:  [id number]   MEDICAL RECORD NO.:  [id number]          PATIENT TYPE:  AMB   LOCATION:  [facility]                           [facility]   PHYSICIAN:  Phil D. Okey Dupre, M.D.     DATE OF BIRTH:  [birth year]   DATE OF PROCEDURE:  07/05/2007  DATE OF DISCHARGE:                               OPERATIVE REPORT   PROCEDURES:  1. Dilatation and curettage.  2. Excisional biopsy of vaginal lesion.   PREOPERATIVE DIAGNOSES:  1. Postmenopausal bleeding.  2. Vaginal lesion.   POSTOPERATIVE DIAGNOSES:  1. Postmenopausal bleeding.  2. Vaginal lesion.   SURGEON:  Javier Glazier. Okey Dupre, M.D.   ANESTHESIA:  General.   POSTOPERATIVE CONDITION:  Satisfactory.   BLOOD LOSS:  Approximately 20 mL.   PATHOLOGY SPECIMENS:  Endocervical curettings, endometrial curettings  and excised vaginal lesion.   OPERATIVE FINDINGS:  The patient was unable to be examined in the office  because of the tenderness of this lesion, which appeared to be on the  labia minora.  Once she was asleep and we could examine her, the lesion  appeared to be just at the introitus of the vagina and mainly inside of  the vagina just at the cutaneous junction.  It measured approximately 2  cm cephalad and caudally and 1 cm wide with a significantly irregular  border and several tiny satellite lesions appearing to be about the same  consistency just between the top portion of the lesion, which started  just at the fourchette at the introitus and extended up almost to the  urethra on the right side of the patient.   PROCEDURE:  Under satisfactory general anesthesia, the patient in dorsal  lithotomy position, the perineum and vagina were prepped and draped in  the usual sterile manner.  Bimanual pelvic examination under anesthesia  revealed a tight introitus with the uterus and adnexa could not be well  palpated.  A weighted speculum placed in the posterior fourchette of the  vagina and the anterior  lip of the cervix grasped with a single-tooth  tenaculum, uterine cavity sounded to a depth of 7 cm.  Endocervical  curettage was carried out with a small serrated curette after the cervix  was dilated to a #6 Hegar dilator.  That was sent separately and an  endometrial curettage was then carried out.  Once this was done, 1%  Xylocaine with 1:100,000 epinephrine approximately 10 mL was injected  below the cutaneous portion of the lesion described above and to be  excised, and it was excised with a 15 blade scalpel and the  incision site closed with a continuous running 2-0 Vicryl suture on an  atraumatic needle.  The area was dry at the end of the procedure.  The  urethra was catheterized with a red straight catheter to make sure we  did not put any sutures in that area, and the urine was clear amber at  the end the procedure.      Phil D. Okey Dupre, M.D.  Electronically Signed     PDR/MEDQ  D:  07/05/2007  T:  07/06/2007  Job:  [id number]

## 2011-01-07 NOTE — Group Therapy Note (Signed)
NAME:  Laura Jimenez, Laura Jimenez NO.:  [id number]   MEDICAL RECORD NO.:  [id number]          PATIENT TYPE:  WOC   LOCATION:  [facility]                   FACILITY:  [facility]   PHYSICIAN:  Ginger Carne, MD DATE OF BIRTH:  1951-03-14   DATE OF SERVICE:  02/03/2008                                  CLINIC NOTE   This patient is a 60 year old African American female, multiparous, who  has a history of well differentiated squamous cell carcinoma with  keratinization and a stromal microinvasion of the vulva who was treated  by Dr. Darlyn Read in January of 2009 with wide local excision.  She has had postmenopausal bleeding at that time which demonstrated  endometrial curettings of a benign polyp without neoplasia or  hyperplasia.  The patient returns today because of concern of swelling  in her vulva and wanted to be certain that there was no recurrence.  She  also has a history of HIV, morbid obesity.   SALIENT PHYSICAL FINDINGS:  The patient demonstrates no lesions that are  visible in the vulva, fourchette, perineum or frenulum.  The vagina is  noted to have no visible lesions.   IMPRESSION:  That of normal findings of the vulva and perineum.   PLAN:  The patient was reassured and she is scheduled for follow-up  every 6 months through the [facility] for follow-up of her vulvar  carcinoma.           ______________________________  Ginger Carne, MD     SHB/MEDQ  D:  02/03/2008  T:  02/03/2008  Job:  [id number]

## 2011-01-07 NOTE — Group Therapy Note (Signed)
NAME:  Laura Jimenez, Laura Jimenez NO.:  [id number]   MEDICAL RECORD NO.:  [id number]          PATIENT TYPE:  WOC   LOCATION[facility]  [facility]                   [facility]ACILITY:  [facility]   PHYSICIAN:  Jaynie Collins, MD     DATE OF BIRTH:  1950/11/20   DATE OF SERVICE:  03/28/2009                                  CLINIC NOTE   The patient is a 60 year old gravida 3, para 2-0-1-2 with a history of  HIV and vulvar carcinoma in situ status post excision with negative  margins and persistent cervical dysplasia who was seen on March 14, 2009,  with one episode of postmenopausal bleeding.  At that visit, she  underwent a repeat Pap smear and endometrial biopsy and she is here to  follow up results.  She denies any further postmenopausal bleeding.   Physical exam is deferred.   PATHOLOGY:  Endometrial biopsy showed superficial fragments of benign  secretory endometrium and benign endocervical glands and mucosa with  squamous metaplasia, no atypia, no dysplasia or malignancy identified.  The Pap smear showed low-grade squamous intraepithelial lesion.   ASSESSMENT AND PLAN:  The patient is a 60 year old female with history  of human immunodeficiency virus, vulvar carcinoma in situ status post  excision, and persistent cervical dysplasia.  The patient has had  persistent LSIL Paps for several years which has been followed with  colposcopy and many of our colposcopies have been benign; however, she  continues to have these low-grade squamous intraepithelial Pap smears.  Given that her cervical dysplasia is persistent, the patient was  counseled regarding doing repeat colposcopy or undergoing an excisional  procedure in the form of a LEEP.  The patient is declining the LEEP at  this visit and also declines a colposcopy for now.  She does not want  any further procedures done, as she says that she puts her fate in God  and she feels like further procedures may lead to cancer.  I tried to  explain to  the patient that having cervical dysplasia and HIV and  especially persistent cervical dysplasia that this can progress to more  concerning cervical intraepithelial neoplasia or cancer and that  excisional procedure is definitely an option for her.  The patient is  not agreeing to this at this point.  She wants to talk to Dr. Argentina Donovan who she has seen more times in this clinic for second opinion about  management of this persistent cervical dysplasia.  The patient was also  given the option of just the colposcopy again, given her abnormal Pap  smear, but she also refuses at this point until she talks to Dr. Okey Dupre.  She says that if Dr. Okey Dupre has the same recommendation that she will be  amendable to watching the LEEP video at her next visit and also may be  undergoing a further procedure, but she does want to get a second  opinion and she does not want to go undergone any unnecessary  procedures.  The patient will be booked for a followup, appointment  possible LEEP evaluation of further cervical dysplasia, evaluation with  Dr. Okey Dupre.  In  the meantime, she was told to call or return to clinic if  she has any further postmenopausal bleeding or any other gynecologic  concerns.           ______________________________  Jaynie Collins, MD     UA/MEDQ  D:  03/28/2009  T:  03/29/2009  Job:  [id number]

## 2011-01-07 NOTE — Consult Note (Signed)
NAME:  Laura, Jimenez              ACCOUNT NO.:  [id number]   MEDICAL RECORD NO.:  [id number]          PATIENT TYPE:  WOC   LOCATION:  [facility]                          FACILITY:  [facility]   PHYSICIAN:  Paola A. Duard Brady, MD    DATE OF BIRTH:  04/27/1951   DATE OF CONSULTATION:  02/09/2008  DATE OF DISCHARGE:                                 CONSULTATION   HISTORY OF PRESENT ILLNESS:  [name] is a very pleasant 60 year old  morbidly obese female with medical issues including obesity, HIV,  abnormal Pap smears, postmenopausal bleeding who had a carcinoma in situ  of the vulva.  She underwent wide local excision by Dr. Okey Dupre in November  2008 which revealed multifocal stromal microinvasive disease with  positive lateral margin with the deepest  extent being less than 0.1with  the deepest extent of the lesion being no more than 1 mm.  She was  dispositioned to close follow-up.  When we last saw her in February, she  had an area of granulation tissue from her superficial separation on the  right side.  She comes in today for follow-up.  She states that she did  see Dr. Okey Dupre in February for follow-up Pap smear and colposcopy.  She  states everything was all right.  Last week she had a soreness in her  vulva.  She was seen at the clinic, and they said that she had a  negative examination.  She thinks that she irritate her self with some  new deodorant infused tissue paper that she had used.  These symptoms  have cleared.  She otherwise denies any complaints.   ALLERGIES:  KEFLEX, PENICILLIN, ADHESIVE TAPE.   MEDICATIONS:  Norvasc, hydrochlorothiazide, Valtrex, Norvir and  Prezista.   PHYSICAL EXAMINATION:  VITAL SIGNS:  Weight 234 pounds which is up to 6  pounds her last visit.  Well-nourished, well-developed female in no  acute distress.  NECK:  Supple with no lymphadenopathy, no thyromegaly.  LUNGS:  Clear to auscultation bilaterally.  CARDIOVASCULAR:  Regular rate and rhythm.  ABDOMEN:   Obese, soft and nontender.  Groins are negative for  adenopathy.  EXTREMITIES:  External genitalia is notable for surgical excision of the  right vulva.  There is no obvious visible lesions other than the  postoperative changes.  The vulva is examined in its entirety.  There  are no new lesions.  Upon palpation, the vulvar and vaginal tissues were  normal.   ASSESSMENT:  This is a 60 year old HIV positive female with stage I  vulvar carcinoma who has no evidence of recurrent disease.   PLAN:  1. Follow up on those records of her Pap smears with Dr. Okey Dupre.  She      has an appointment with Dr. Okey Dupre in September and will return to      see [name] in December.  2. She was shown her vulva with a mirror, and will continue to do      vulvar self-exams on a regular basis.      Paola A. Duard Brady, MD  Electronically Signed     PAG/MEDQ  D:  02/09/2008  T:  02/09/2008  Job:  [id number]   cc:   Javier Glazier. Okey Dupre, M.D.   Telford Nab, R.N.  [address]  [place], [place] [address]   Dr. Richrd Prime

## 2011-01-07 NOTE — Group Therapy Note (Signed)
NAME:  Laura Jimenez, Laura Jimenez NO.:  [id number]   MEDICAL RECORD NO.:  [id number]          PATIENT TYPE:  WOC   LOCATION:  [facility]                   FACILITY:  [facility]   PHYSICIAN:  Argentina Donovan, MD        DATE OF BIRTH:  07/19/1951   DATE OF SERVICE:                                  CLINIC NOTE   This is a 60 year old widowed black female gravida 3, para 2-0-1-2 with  HIV.  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  She  is supposed to come for a visit every 3 months.  This did not happen.  She came in 1 week ago and has a fungating lesion on the right side of  the labia minora just at the introitus of the vagina measuring about 1-  1/2 x 1 cm.  She would not agree to biopsy last week because she was too  nervous. She was given some Valium. Came back today. Still unable to  safely do a biopsy with the patient moving around and afraid of the  procedure. In addition she was sent for an ultrasound because of  postmenopausal bleeding which they were unable to a vaginal probe  because of this lesion, was just too tender.  I am going to schedule the  patient for a D&C an excisional vulvar biopsy in the hospital, it would  be much easier on her and give her some medication for her cramping that  she is having up until then.  Will give her some Darvocet N 100 #30.           ______________________________  Argentina Donovan, MD     PR/MEDQ  D:  06/30/2007  T:  07/01/2007  Job:  [id number]

## 2011-01-07 NOTE — Consult Note (Signed)
NAME:  Laura Jimenez, Laura Jimenez NO.:  [id number]   MEDICAL RECORD NO.:  [id number]          PATIENT TYPE:  WOC   LOCATION:  [facility]                          FACILITY:  [facility]   PHYSICIAN:  Paola A. Duard Brady, MD    DATE OF BIRTH:  Nov 13, 1950   DATE OF CONSULTATION:  07/14/2007  DATE OF DISCHARGE:                                 CONSULTATION   REFERRING PHYSICIAN:  Myra C. Marice Potter, M.D.   The patient is seen today in consultation at the request of Dr. Marice Potter.  [name] is a 60 year old gravida 3, para 2, aborta 1 with a diagnosis  of HIV dating back to 7.  Fortunately her viral load for the past 5-6  months has been nondetectable.  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  She was supposed to have close follow-up  every 3 months but was lost to follow-up due to noncompliance.  She was  seen by Dr. Marice Potter June 24, 2007.  At that time she was complaining of  postmenopausal bleeding for several weeks.  Prior to that she had been  amenorrheic for about 6 years.  At the time of her exam on October 30,  the patient was noted have a fungating lesion on the right side of her  vulva.  The patient refused biopsy and subsequently underwent exam under  anesthesia and biopsies in the operating room by Dr. Kristen Loader on  July 05, 2007.  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  The  deepest extent was 0.1 cm.  It was classified as a T1a Nx Mx.  There was  no lymphovascular space involvement noted but the lateral margins were  free.  She did have an endometrial curettage which revealed a benign  endometrial polyp with no hyperplasia or malignancy, endocervical  curettage was negative.  She comes in today for evaluation of the above.  Since that time  she has continued to have some lower abdominal crampy  pain.  She has also had some discomfort from the vulvar lesion.   REVIEW OF SYSTEMS:  She denies any chest pain, shortness of breath,  nausea, vomiting, fevers, chills, headaches or visual changes.  She does  complain of some crampy lower abdominal discomfort.  She has had no  further bleeding.   PAST MEDICAL HISTORY:  1. Morbid obesity.  2. HIV.  3. Low grade Pap smears.  4. Postmenopausal bleeding.  5. Cutaneous lupus erythematosus.  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   PAST SURGICAL HISTORY:  None, as above.   ALLERGIES:  KEFLEX AND PENICILLIN WHICH CAUSE ITCHING AND SWELLING,  ADHESIVE TAPE CAUSES SWELLING.   SOCIAL HISTORY:  She quit smoking about the age of 64.  She denies use  of alcohol or illicit drugs.  Her HIV was from sexual contact.  She is  retired.  She worked for [employer] in  [place].   FAMILY HISTORY:  Significant for hypertension.  There are no other  gynecologic malignancies.   MEDICATIONS:  1. Norvasc 10 mg daily.  2. Hydrochlorothiazide 25 mg daily.  3. Valtrex daily.  4. Norvir twice daily.  5. __________  400 mg twice daily.  6. Prezista 600 mg b.i.d.   HEALTH MAINTENANCE:  1. She had a mammogram in the fall of 2008.  2. She had a colonoscopy in February 2008.  3. She had 2 bone marrow biopsies earlier this year.  4. She had blood transfusion secondary to anemia that was felt to be      related to one of her medications, that medication was stopped and      she subsequently recovered.   The majority of the history is provided by her daughter, [name], who comes  with her today.   PHYSICAL EXAMINATION:  Weight 228 pounds, height 5 feet 6, blood  pressure 140/78, pulse 76.  Well-nourished, well-developed female in no  acute distress.  NECK:  Supple, there is no lymphadenopathy, no thyromegaly.  LUNGS:  Clear to auscultation bilaterally.  CARDIOVASCULAR EXAM:  Regular rate and  rhythm.  ABDOMEN:  Notable for an infraumbilical surgical incision.  Abdomen is  obese, soft, nontender, nondistended.  There are no palpable masses or  hepatosplenomegaly though exam is limited by habitus.  Groins are  negative for adenopathy.  EXTREMITIES:  There is no edema.  PELVIC:  Exam is poorly tolerated by the patient.  External genitalia is  notable for an open sore from her most recent surgical excision on the  right labia majora towards the perineal body.  There is a separate  satellite lesion measured approximately a cm between this lesion and the  anus.  There are no other visible lesions.   ASSESSMENT:  A 60 year old with vulvar carcinoma with depth of invasion  of 1 mm.  Due to the patient's issues, I believe that it would be most  reasonable to proceed with a staged procedure.  We will proceed with a  wide local excision of both the known cancer lesion as well as the other  lesion on the perineum.  If pathology at that time dictates that she  needs, groins to be done as a separate procedure.  In speaking to the  patient and her daughter, they would rather do it as a staged procedure  than have all things sent at one time, as she has a lot of issues with  pain and  anxiety.  Risks of wide local excision were discussed with the patient.  We will do preoperative teaching today regarding pericare.  She will be  scheduled for surgery.  Risks and benefits again were discussed with the  patient and they wished to proceed.      Paola A. Duard Brady, MD  Electronically Signed     PAG/MEDQ  D:  07/14/2007  T:  07/14/2007  Job:  [id number]   cc:   Allie Bossier, MD   Javier Glazier. Okey Dupre, M.D.   Telford Nab, R.N.  [address]  [place], [place] [address]   Dianna Limbo, M.D.

## 2011-01-07 NOTE — Consult Note (Signed)
NAME:  Laura Jimenez, Laura Jimenez NO.:  [id number]   MEDICAL RECORD NO.:  [id number]          PATIENT TYPE:  OUT   LOCATION:  GYN                          FACILITY:  [facility]   PHYSICIAN:  Paola A. Duard Brady, MD    DATE OF BIRTH:  02/05/1951   DATE OF CONSULTATION:  DATE OF DISCHARGE:                                 CONSULTATION   [name] is a very pleasant 60 year old with medical issues including  obesity, HIV, abnormal Pap smears who had multifocal microinvasive  carcinoma of the vulva.  She underwent wide local excision by Dr. Okey Dupre  in November 2008 which revealed multifocal stromal microinvasive disease  with a positive lateral margin.  The largest deep extent was less than  0.1 cm of invasion.  She was dispositioned to no nodal resection, no  wide local excision, and has been followed expectantly.  We last saw her  in June 2009, at which time her vulva was well healed, and there were no  visible lesions.  She continues to follow up with Dr. Okey Dupre for abnormal  Pap smear.  She states that she was seen by him earlier in December  2009, had follow up later this month and has followup next week for  discussion of results of her abnormal cells.  We do not have the benefit  of any of those pathology reports with regards to her abnormal Pap  smears, but it appears that she has very close followup.  She states she  has been doing vulvar checks and has not seen or noticed any new lesion.  She denies any pruritus or any vaginal bleeding.  She denies any change  in her bowel or bladder habits, any nausea, vomiting, fevers or chills.   MEDICATIONS:  Include Norvasc, hydrochlorothiazide, Valtrex, Norvir and  Prezista.   ALLERGIES:  KEFLEX, PENICILLIN and ADHESIVE TAPE.   PHYSICAL EXAMINATION:  GENERAL:  Well-nourished, well-developed female  in no acute distress.  NECK:  Supple.  There is no lymphadenopathy, no thyromegaly.  LUNGS:  Clear to auscultation bilaterally.  CARDIOVASCULAR:  Regular rate and rhythm.  ABDOMEN:  Morbidly obese, soft, nontender, nondistended.  She has well-  healed vertical skin incision.  There is no evidence of any incisional  hernias.  Groins are negative for adenopathy.  EXTREMITIES:  There is no edema.  PELVIC:  The vulva was examined in its entirety.  There are no visible  lesions.  There are postsurgical changes on the right inferior aspect of  the vulva near the posterior fourchette.  There is scarring in the  perianal region on the left side.  There are no other visible lesions.  Palpation of the vulva was negative.   ASSESSMENT:  A 60 year old human immunodeficiency virus-positive female  with a stage I vulvar carcinoma with no evidence of recurrent disease.   PLAN:  1. She will follow up with [name] in 6 months.  2. She will continue her followup with Dr. Okey Dupre.  She was encouraged      to continue doing vulvar self-exams.      Paola A. Duard Brady, MD  Electronically  Signed     PAG/MEDQ  D:  08/09/2008  T:  08/09/2008  Job:  [id number]   cc:   Javier Glazier. Okey Dupre, M.D.   Telford Nab, R.N.  [address]  [place], [place] [address]   Richrd Prime, M.D., [facility]

## 2011-01-07 NOTE — Group Therapy Note (Signed)
NAME:  Laura Jimenez, CLENDENNING NO.:  [id number]   MEDICAL RECORD NO.:  [id number]          PATIENT TYPE:  WOC   [facility]ATION[facility]  [facility]                   FACILITY:  [facility]   PHYSICIAN:  Karlton Lemon, MD      DATE OF BIRTH:  1950-10-11   DATE OF SERVICE:  07/14/2007                                  CLINIC NOTE   CHIEF COMPLAINT:  Follow-up surgery.   HISTORY OF PRESENT ILLNESS:  This is a 60 year old African American  female, gravida 4, para 2-0-1-2, with a diagnosis of HIV.  She recently  had surgery on July 05, 2007, for a vaginal lesion and  postmenopausal bleeding.  She had dilatation and curettage and  excisional biopsy of the vaginal lesion.  Pathology from those surgeries  shows a well-differentiated squamous cell carcinoma with keratinization  with multifocal stromal microinvasion.  The patient has been seen by Dr.  De Blanch at [facility] and she has been scheduled for wide  excision of the vulvar lesion on September 06, 2007.  She also had  endometrial curetting suggestive of benign endometrial polyp without  evidence of hyperplasia or malignancy.  Her endocervical curetting  showed benign squamous mucosa.  The patient states that she is still  having some pain at the biopsy site but is otherwise doing well.  She is  requesting more Percocet for her pain.  She also complains of burning  with urination for the past 4 days.  She also complains of urgency and  frequency.  She also had a Pap smear performed on September 30 showing  LSIL and colposcopy was discussed today.   PAST MEDICAL HISTORY:  1. HIV.  2. History of carcinoma in situ of her perineum.  3. Morbid obesity.  4. Low-grade squamous intraepithelial lesion on her Pap smear.  5. Postmenopausal bleeding.  6. Cutaneous lupus erythematosus.   PAST SURGICAL HISTORY:  1. Recent D&C and vulvar biopsy on July 05, 2007.  2. History of wide local excision of the vulva in January 2007.   MEDICATIONS:  1. Norvasc.  2. Isentress.  3. Hydrochlorothiazide.  4. Norvir.  5. Valtrex.  6. Percocet.   ALLERGIES:  KEFLEX, PENICILLIN.   PHYSICAL EXAMINATION:  This is an obese, well-appearing female in no  distress.  VITALS:  Temperature 97.1, pulse 77, blood pressure 146/74.  HEAD:  Normocephalic, atraumatic.  ABDOMEN:  Obese, soft, nontender.  GENITOURINARY:  Right labia with healing lesion with suture in place.  Vaginal mucosa appears pink and moist.  Speculum exam and bimanual exam  are not performed today.  EXTREMITIES:  No edema, no tenderness.   ASSESSMENT AND PLAN:  This is a 60 year old female with human  immunodeficiency virus and squamous cell carcinoma of the vulva that is  well-differentiated.  She also is noted to have a __________ -grade  squamous intraepithelial lesion on her Papanicolaou smear.   1. The patient is to follow for her surgery with Dr. Katheren ShamsSharol Given on August 06, 2007.  2. The patient is to follow up in 2 months to discuss colposcopy.  The  patient has difficulty with speculum examinations in the past and      stating that she needed to have general anesthesia for this      performed.  This will be discussed after her vulvar surgery has      been performed.  3. The patient has a urinary tract infection today.  We will send the      urine for culture.  She has been provided with a prescription of      Macrobid 100 mg p.o. b.i.d. for 7 days.           ______________________________  Karlton Lemon, MD     NS/MEDQ  D:  07/14/2007  T:  07/15/2007  Job:  [id number]

## 2011-01-07 NOTE — Consult Note (Signed)
NAME:  Laura Jimenez, TERWILLIGER NO.:  [id number]   MEDICAL RECORD NO.:  [id number]          PATIENT TYPE:  OUT   LOCATION:  GYN                          [facility]ACILITY:  [facility]   PHYSICIAN:  Laurette Schimke, MD     DATE OF BIRTH:  June 30, 1951   DATE OF CONSULTATION:  03/01/2009  DATE OF DISCHARGE:                                 CONSULTATION   REASON FOR VISIT:  Follow-up for vulvar cancer.   HISTORY OF PRESENT ILLNESS:  This is a 60 year old who has HIV, history  of abnormal Pap tests and multifocal Microvasive carcinoma of the vulva.  In November 2008, a wide local excision was notable for multifocal  seromal Microvasive disease with a positive lateral margin.  The largest  depth of invasion was less than 0.1 cm.  In June of 2009, she was seen,  and no lesions were noted.  At the visit in December of 2009, the  patient was encouraged to do vulva self-exams, and that evaluation was  negative.  At this visit, [name] presents for reevaluation.  She  denies any pruritus or masses.  She denies tobacco use, and she states  that her viral load at this time is undetectable.  She has an  appointment scheduled at the general GYN clinic for evaluation of  abnormal Pap test and also because she notified them that she had  vaginal bleeding.   MEDICATIONS:  Include:  1. Norvasc.  2. Hydrochlorothiazide.  3. Valtrex.  4. Norvir.  5. Prezista.   PHYSICAL EXAMINATION:  Well-developed female in no acute distress.  NECK:  Supple.  No inguinal or supraclavicular adenopathy.  Inspection of the vulva is without any discrete masses.   Acetic acid was placed, and on the right vulva, acetowhite lesions were  noted.  The largest was biopsied after administration of 1% lidocaine.   ASSESSMENT:  This is a 60 year old, human immunodeficiency virus  positive, [name], [name]      Laurette Schimke, MD  Electronically Signed     WB/MEDQ  D:  03/01/2009  T:  03/01/2009  Job:  [id number]   cc:   Telford Nab, R.N.  [address]  [place], [place] [address]   Javier Glazier. Okey Dupre, M.D.

## 2011-01-10 NOTE — Op Note (Signed)
NAME:  Laura Jimenez, Laura Jimenez              ACCOUNT NO.:  [id number]   MEDICAL RECORD NO.:  [id number]          PATIENT TYPE:  AMB   LOCATION:  [facility]                           [facility]   PHYSICIAN:  Phil D. Okey Dupre, M.D.     DATE OF BIRTH:  [birth year]   DATE OF PROCEDURE:  09/24/2005  DATE OF DISCHARGE:                                 OPERATIVE REPORT   PREOPERATIVE DIAGNOSIS:  Ulcerating perineal lesion.   POSTOPERATIVE DIAGNOSIS:  Ulcerating perineal lesion.   PROCEDURE:  Excision of perineal lesion and revision of perineum and old  episiotomy scar.   SURGEON:  Javier Glazier. Okey Dupre, M.D.   ANESTHESIA:  General.   ESTIMATED BLOOD LOSS:  50 mL.   POSTOPERATIVE CONDITION:  Satisfactory.   PATHOLOGY:  Excised lesion sent to pathology.   OPERATIVE FINDINGS:  Perineum in the area of the an old episiotomy scar and  slightly to the left just at the mucocutaneous junction,, there was a 1 cm  yellowish ulceration that has persisted for some time, very painful to  touch.   PROCEDURE:  Under satisfactory general anesthesia with the patient in the  dorsal lithotomy position, the perineum and vagina were prepped and draped  in the usual sterile manner.  A diamond-shaped incision starting at  approximately 2 cm inside the vagina and staying 0.5-1 cm, the wide end of  the diamond at approximately the midportion of the ulceration and the lower  portion of the diamond into an old episiotomy scar, the lesion was removed  by sharp dissection and the incision then closed two layers like a typical  episiotomy closure with a running locked suture out to the mucocutaneous  junction and then in interrupted sutures at the base and a running suture  down and then through the skin up to the fourchette of the vagina.  With  this we used 2-0 chromic catgut suture.  The area was dry at the end of  procedure, the patient transferred to the recovery room in satisfactory  condition.     ______________________________  Javier Glazier Okey Dupre, M.D.     PDR/MEDQ  D:  09/24/2005  T:  09/24/2005  Job:  [id number]

## 2011-01-10 NOTE — Group Therapy Note (Signed)
NAME:  Laura Jimenez, Laura Jimenez NO.:  [id number]   MEDICAL RECORD NO.:  [id number]          PATIENT TYPE:  WOC   LOCATION:  [facility]                   FACILITY:  [facility]   PHYSICIAN:  Ellis Parents, MD    DATE OF BIRTH:  1950-09-04   DATE OF SERVICE:                                    CLINIC NOTE   HISTORY OF PRESENT ILLNESS:  This 60 year old female returns to the clinic  for biopsy of a vaginal ulcer which was noted on examination on June 09, 2005 by Dr. Carolanne Grumbling.  At that time, a Pap smear was done which was  reported as ASCUS, suspicious for low-grade squamous intraepithelial lesion.  The patient has a long history, ever since the birth of her youngest  daughter 23 years ago, of discomfort on the right side of the vagina in the  vestibule and on the perineum which apparently began after the birth of her  daughter from a __________ episiotomy.  The patient continually will  irritate this area with frequent washings and intrusion of the soap and wash  rags into the vagina, and she says it also burns when she urinates and  especially if it itches.   PHYSICAL EXAMINATION:  GENITOURINARY:  The area of the previously described  ulcer is completely epithelialized and soft with no ulceration to the  mucosa. The rest of the vagina appears grossly normal.  SPECULUM:  A nulliparous-appearing cervix which was slightly atrophic with a  small external os.  Colposcopic examination revealed a very minimal  acetowhite area at 9 o'clock.   PROCEDURE:  ECC was performed, and a cervical biopsy was performed, although  it was difficult to obtain a good biopsy in this area since the lesion was  so flushed, and the biopsy forceps did not grasp a lot of tissue, but some  tissue was obtained.   DISCHARGE INSTRUCTIONS:  The patient was advised to cease and desist all  intravaginal manipulation (she is sexually inactive).  The patient is to  return in 2 weeks for review of the  pathology report.           ______________________________  Ellis Parents, MD     SA/MEDQ  D:  07/01/2005  T:  07/01/2005  Job:  [id number]

## 2011-01-10 NOTE — Group Therapy Note (Signed)
NAME:  Laura Jimenez, PARSLEY NO.:  [id number]   MEDICAL RECORD NO.:  [id number]          PATIENT TYPE:  WOC   LOCATION[facility]  [facility]                   FACILITY:  [facility]   PHYSICIAN:  Carolanne Grumbling, M.D.   DATE OF BIRTH:  1951/02/19   DATE OF SERVICE:                                    CLINIC NOTE   HISTORY OF PRESENT ILLNESS:  The patient is a 60 year old G3, P2-1-0-2 who  is HIV positive presenting for her Pap smear.  She has a history of abnormal  Paps in 2004.  She reports that she was scheduled for surgery, but because  her immune system was so poor, they did not want to do surgery at that time.  I do not have any records with the specifics.  She reports that she has been  having some spotting that is coming from a sore in her vaginal area that she  has had for over a year.   PAST MEDICAL HISTORY:  1.  HIV diagnosed in 65.  She is followed by Dr. Flavia Shipper. who has      restarted her HIV medicine.  She has a long history of being      noncompliant with her medications and having a multiple drug-resistant      strain of HIV.  2.  Hypertension.  3.  Skin rash that she has been told was secondary to her HIV.   GYNECOLOGICAL HISTORY:  As above.  Last mammogram was in 2001.   MEDICATIONS:  1.  Norvasc 1 tab daily.  2.  Kaletra p.o. b.i.d.  3.  Truvada 1 daily.  4.  Bactrim 1 tab daily.  5.  Azithromycin 2 tablets weekly.  6.  __________  p.r.n.   ALLERGIES:  No known drug allergies.   PHYSICAL EXAMINATION:  VITAL SIGNS:  Temperature 98.0, blood pressure 156/93  (patient has not taken her blood pressure pill today), pulse 82, weight  195.6, height 5 feet 5 inches.  GENERAL:  Very pleasant female in no apparent distress.  CV:  Regular rate and rhythm.  LUNGS:  Clear to auscultation bilaterally.  ABDOMEN:  Obese, nontender, nondistended.  GU:  External genitalia is significant for an approximate 5.0-mm ulcer on  her right posterior introitus that is very  friable, and tender to exam.  The  vagina has pink rugae.  The cervix was parous.  A Pap smear was done.  The  vaginal ulcer:  I did to numb it and biopsy it after Dr. Shawnie Pons evaluated it  with me, and agreed that it needed biopsy.  However, the patient was unable  to stay still for the numbing, and wanted to return at a later date.   IMPRESSION:  1.  Vaginal ulcer suspicious for cancer.  2.  History of abnormal Pap smears.  3.  Hypertension.  4.  Human-immunodeficiency-virus positive.   PLAN:  1.  Pap smear done today.  We will notify her of results.  2.  Patient will return in one week to have the biopsy done.  3.  She was given a prescription for Valium that she is to  take 1 hour prior      to the procedure.  She understands that she has to have a driver with      her.   PRECEPTOR:  Dr. Shawnie Pons.           ______________________________  Carolanne Grumbling, M.D.     TW/MEDQ  D:  06/09/2005  T:  06/09/2005  Job:  [id number]

## 2011-01-10 NOTE — Group Therapy Note (Signed)
NAME:  Laura Jimenez, Laura Jimenez NO.:  [id number]   MEDICAL RECORD NO.:  [id number]                   PATIENT TYPE:  OUT   LOCATION:  [facility]                           FACILITY:  [facility]   PHYSICIAN:  Argentina Donovan, MD                     DATE OF BIRTH:  [birth year]   DATE OF SERVICE:  11/21/2003                                    CLINIC NOTE   REASON FOR VISIT:  The patient is a 60 year old black female with HIV  followed by the infectious disease clinic who comes in because of recurrent  vulvar vaginal itching and multiple treatments for Trichomonas.  Also, she  had been on Premarin for a year for hot flashes with no Provera.   PHYSICAL EXAMINATION:  PELVIC:  The vagina is clean and pink with a clean  parous cervix.  Wet prep was taken.  The patient denies sexual intercourse  for a long period of time but has had some abnormal bleeding recently.  Endometrial biopsy was taken.  The uterus was sounded to 8 cm.  A wet prep  showed many Trichomonas and diffuse sperm.  The patient continues to deny  sexual contact.   We will treat her with Flagyl plus MetroGel and call her when the biopsy  comes back.  I have told her I wanted her to stop taking the Premarin and if  she continues having this menopausal symptom to talk to Dr. Orvan Falconer, have  him to call me, and we will discuss what to put her on.                                               Argentina Donovan, MD    PR/MEDQ  D:  11/21/2003  T:  11/21/2003  Job:  [id number]

## 2011-01-10 NOTE — Group Therapy Note (Signed)
NAME:  Laura Jimenez, Laura Jimenez NO.:  [id number]   MEDICAL RECORD NO.:  [id number]          PATIENT TYPE:  WOC   LOCATION[facility]  [facility]                   FACILITY:  [facility]   PHYSICIAN:  Tinnie Gens, MD        DATE OF BIRTH:  Jan 19, 1951   DATE OF SERVICE:  12/17/2005                                    CLINIC NOTE   CHIEF COMPLAINT:  Rash.   HISTORY OF PRESENT ILLNESS:  The patient is a 60 year old patient of Dr.  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  She reports at that time she had a boil that was busted  open and this led to some diffuse rash that consisted of mostly itching.  The patient was on hydroxyzine and hydrocortisone cream that she has gotten  from her primary care doctor, who is infectious disease Dr. Roxan Hockey, as  well as the emergency department.  However, this has failed to treat her.  She does have referral for dermatology on May 15 which she thinks is too  long and she is here today to get an urgent dermatology referal.  The  patient denies change in soaps or detergents.  She denies any change in  medication recently.   EXAMINATION:  Her VITAL SIGNS are on the chart.  She is an obese white  female in no acute distress.  She has diffuse lichenification throughout her  entire body including over her breasts, her abdomen, her arms and her back.  Her legs are much less lichenified.  There is a fine, sort of papular rash  noted in amongst all the excoriated and change in pigmentation related, I  think, to chronic scratching.   IMPRESSION:  Rash, unclear etiology.   PLAN:  Refer to dermatology.           ______________________________  Tinnie Gens, MD     TP/MEDQ  D:  12/17/2005  T:  12/18/2005  Job:  [id number]

## 2011-01-10 NOTE — Op Note (Signed)
[facility]  Patient:    Laura Jimenez, Laura Jimenez                       MRN: [id number] Proc. Date: 03/10/00 Attending:  Barbette Hair. Arlyce Dice, M.D. [facility] CC:         Leory Plowman, M.D.                           Operative Report  HISTORY:  Mrs. [name] is a 59 year old female, HIV positive, who has had dysphagia to solids.  Test is performed for further evaluation.  INFORMED CONSENT:  Patient provided consent after risks, benefits and alternatives were explained.  MEDICATIONS:  Versed 10, fentanyl 100, Robinul 0.2 mg IV and Cetacaine spray.  PROCEDURE:  Patient was placed in left lateral decubitus position and administered continuous low-flow oxygen and was placed on pulse oximetry.  The Olympus video gastroscope was inserted under direct vision into the oropharynx and esophagus.  FINDINGS: 1. At the GE junction, there was a fibrotic stricture. 2. A small hiatal hernia was present. 3. Normal proximal esophagus, stomach and duodenum.  THERAPEUTICS:  With the scope in the distal stomach, a guidewire was placed through the scope and scope was withdrawn.  Under fluoroscopic guidance, numbers 15, 16 and 17 mm Savary dilators were passed with mild resistance. There was a very small amount of blood on the dilator tips over the latter two dilators.  IMPRESSION:  Peptic esophageal stricture.  RECOMMENDATIONS:  Repeat dilatation p.r.n. DD:  03/10/00 TD:  03/11/00 Job: [id number]

## 2011-01-10 NOTE — Group Therapy Note (Signed)
NAME:  Laura Jimenez, Laura Jimenez NO.:  [id number]   MEDICAL RECORD NO.:  [id number]          PATIENT TYPE:  WOC   LOCATION[facility]                   FACILITY:  [facility]   PHYSICIAN:  Sharolyn Douglas, CNM        DATE OF BIRTH:  29-Mar-1951   DATE OF SERVICE:  04/22/2006                                    CLINIC NOTE   REASON FOR VISIT:  Follow up Pap smear.   This is a 60 year old BFP2 who is postmenopausal and here for a Pap smear.  Last Pap was __________  in October 2006.  She also had a mammogram done in  October 2006.  She was last seen by Dr. Shawnie Pons.  She has been seen by the  dermatologist at [facility] who has diagnosed cutaneous lupus erythematosus and  her dermatologic symptoms are improving on treatment.  She has another  appointment coming up with him soon.  Please see his note under  correspondence.  She also is followed for her HIV with Dr. 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   PHYSICAL EXAMINATION:  VITAL SIGNS:  Blood pressure 113/69, weight 195.  GENERAL:  She is in no acute distress.  She does have extensive macular  rash.  PELVIC:  There is atrophic vaginal changes, scant white discharge,  paracervix.  Pap is done.  Bimanual cervix very firm.  Uterus NSFP, adnexa  without discrete tenderness and masses.   ASSESSMENT:  Gyn exam for HIV patient also status post CIS of the perineum.  Cutaneous lupus erythematosus being treated.   PLAN:  She will need Paps every six months.  Also stressed the need to take  her medications, particularly her antiretrovirals as directed and patient  understands this.  She will follow up in six months for further Pap smear.           ______________________________  Sharolyn Douglas, CNM     DP/MEDQ  D:  04/22/2006  T:  04/23/2006  Job:  [id number]

## 2011-01-10 NOTE — Group Therapy Note (Signed)
NAME:  Laura Jimenez, Laura Jimenez NO.:  [id number]   MEDICAL RECORD NO.:  [id number]          PATIENT TYPE:  WOC   [facility]ATION[facility]                   [facility]   PHYSICIAN:  Argentina Donovan, MD        DATE OF BIRTH:  1950/09/28   DATE OF SERVICE:                                    CLINIC NOTE   HISTORY OF PRESENT ILLNESS:  The patient is a 60 year old African-American  who has had a chronic perineal lesion that was excised on September 24, 2005.  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  No invasive carcinoma was identified, and the margins were  free of tumor.   PHYSICAL EXAMINATION:  GENITOURINARY:  On examination, she is well healed,  and has no complaints in that area.   MEDICAL DECISION MAKING:  However, she has had, following a viral infection,  lower abdominal pain for the past 2 weeks.  I told her that if it has not  passed within a month to come back, and we will evaluate that further.  On  the other hand, I want to follow up with at least one visit every 3 months  to check on the perineal area where we had the carcinoma in situ and then,  after a year, we will decide on how often to see her.   IMPRESSION:  Perineal carcinoma in situ lesion.           ______________________________  Argentina Donovan, MD     PR/MEDQ  D:  10/29/2005  T:  10/29/2005  Job:  [id number]

## 2011-01-10 NOTE — Group Therapy Note (Signed)
NAME:  Laura Jimenez, Laura Jimenez NO.:  [id number]   MEDICAL RECORD NO.:  [id number]          PATIENT TYPE:  WOC   [facility]ATION[facility]                   [facility]   PHYSICIAN:  Argentina Donovan, MD        DATE OF BIRTH:  [birth year]   DATE OF SERVICE:                                    CLINIC NOTE   CHIEF COMPLAINT:  Follow-up for ECC and colposcopy.   SUBJECTIVE:  The patient is a 60 year old African-American female with a  history of ASCUS suspicious for low-grade squamous intraepithelial lesion.  The patient initially came to clinic in October of 2006 and was set up for a  colposcopy and ECC.  At that time, an area in the posterior introitus was  also observed to be a hypertrophic tender firm area, the patient said she  had it for quite some time and it bothers her when she urinates.  This was  appreciated on exam by Dr. Mayford Knife and the patient was also supposed to  undergo a biopsy of this area in addition to the [facility] and colposcopy.  The  patient came back to clinic on November of 2006 and underwent ECC and  colposcopy.  However, Dr. Lauralee Evener was unable to find the area to be  biopsied that Dr. Mayford Knife and the patient were reporting.  The patient  comes back in today to review the results of her colposcopy and ECC.   The pathology reports are as follows:  The endocervical curettage was  skinny, benign endocervical and squamous mucosa with limited material.  The  cervical biopsy shows benign squamous mucosa.  This was reassuring and this  was discussed with the patient in detail, and also the follow-up.  However,  the patient also reports continued pain with urination in the posterior  introitus.   OBJECTIVE:  VITAL SIGNS:  Noted.   PHYSICAL EXAMINATION:  GENERAL:  In no apparent distress.  Alert and  oriented x3.  CARDIOVASCULAR AND PULMONARY EXAMINATION:  Deferred.  EXTERNAL VAGINAL EXAMINATION:  However, the patient has a firm 1 cm by 1 cm  area in the  posterior introitus just at the area of the convergence of the  labia minora just anterior to the rectum.  The area is firm, tender, and  fixed.   ASSESSMENT AND PLAN:  1.  ASCUS Pap.  The patient's ECC and cervical biopsies are reassuring.  She      will go to every 6 months Pap smears at the present time.  2.  Firm area on the introitus.  This area will undergo biopsy and surgical      revision by Dr. Okey Dupre in the OR under general anesthesia.  The patient      was consented for the procedure and this will be scheduled through Dr.      Okey Dupre.   This is Dr. Tanya Nones dictating for Dr. Kristen Loader.          ______________________________  Argentina Donovan, MD    PR/MEDQ  D:  09/02/2005  T:  09/02/2005  Job:  [id number]

## 2011-01-10 NOTE — Procedures (Signed)
[facility]  Patient:    TAMSYN, OWUSU                       MRN: [id number] Adm. Date:  [id number] Disc. Date: [id number] Attending:  Judeth Cornfield CC:         Joneen Boers                           Procedure Report  PROCEDURE:  Upper endoscopy.  HISTORY:  [name] is HIV positive.  She has a history of esophageal stricture.  She has dysphagia to solids.  INFORMED CONSENT:  The patient provided consent after risks, benefits, and alternatives were explained.  MEDICATIONS:  Versed 8 mg, fentanyl 100 mcg, Robinul 0.2 mg IV, and Cetacaine spray.  DESCRIPTION OF PROCEDURE:  The patient was placed in the left lateral decubitus position, administered continuous low-flow oxygen, and was placed on pulse oximetry.  The Olympus video gastroscope was inserted under direct vision into the oropharynx and esophagus.  FINDINGS: 1. At the GE junction was a fibrotic stricture.  The 10 mm gastroscope    passed through with slight resistance. 2. Small hiatal hernia. 3. There was streaky erythema in the gastric antrum.  THERAPEUTICS:  With the scope in the distal stomach, a guidewire was placed through the scope and the scope was withdrawn.  Under fluoroscopic guidance, #12.8, 14, and 15 mm Savary dilators were passed with resistance.  There was a small amount of heme on the 15 mm dilator tip.  IMPRESSION: 1. Peptic esophageal stricture. 2. Gastritis.  RECOMMENDATIONS:  Repeat dilatation in approximately four weeks. DD:  01/29/00 TD:  01/31/00 Job: [id number]

## 2011-06-02 LAB — HEMOGLOBIN AND HEMATOCRIT, BLOOD: HCT: 34.4 — ABNORMAL LOW

## 2011-06-02 LAB — BASIC METABOLIC PANEL
CO2: 27
Chloride: 108
GFR calc Af Amer: >60
Glucose, Bld: 91
Sodium: 139

## 2011-06-02 LAB — PREGNANCY, URINE: Preg Test, Ur: NEGATIVE

## 2011-06-03 LAB — COMPREHENSIVE METABOLIC PANEL
ALT: 19
CO2: 29
Calcium: 9.6
Creatinine, Ser: 0.73
GFR calc non Af Amer: >60
Glucose, Bld: 99
Sodium: 139

## 2011-06-03 LAB — POCT URINALYSIS DIP (DEVICE)
Glucose, UA: NEGATIVE
Nitrite: POSITIVE — AB
Urobilinogen, UA: 1
pH: 7

## 2011-06-03 LAB — CBC
HCT: 36.5
Hemoglobin: 12.3
MCHC: 33.8
MCV: 90.9
RBC: 4.01

## 2011-06-03 LAB — URINALYSIS, ROUTINE W REFLEX MICROSCOPIC
Bilirubin Urine: NEGATIVE
Hgb urine dipstick: NEGATIVE
Protein, ur: NEGATIVE
Urobilinogen, UA: 0.2

## 2011-07-30 ENCOUNTER — Encounter: Payer: Self-pay | Admitting: Internal Medicine

## 2011-08-11 ENCOUNTER — Telehealth: Payer: Self-pay | Admitting: Urgent Care

## 2011-08-11 ENCOUNTER — Ambulatory Visit: Payer: Self-pay | Admitting: Urgent Care

## 2011-08-11 NOTE — Telephone Encounter (Signed)
Pt was a no show

## 2011-11-13 ENCOUNTER — Other Ambulatory Visit (HOSPITAL_COMMUNITY)
Admission: RE | Admit: 2011-11-13 | Discharge: 2011-11-13 | Disposition: A | Discharge status: Home / Self Care | Payer: Medicaid Other | Source: Ambulatory Visit | Attending: Advanced Practice Midwife | Admitting: Advanced Practice Midwife

## 2011-11-13 ENCOUNTER — Ambulatory Visit (INDEPENDENT_AMBULATORY_CARE_PROVIDER_SITE_OTHER): Payer: Medicaid Other | Admitting: Advanced Practice Midwife

## 2011-11-13 ENCOUNTER — Other Ambulatory Visit: Payer: Self-pay | Admitting: Family Medicine

## 2011-11-13 ENCOUNTER — Encounter: Payer: Self-pay | Admitting: Advanced Practice Midwife

## 2011-11-13 VITALS — BP 153/75 | HR 60 | Temp 99.0°F | Ht 63.75 in | Wt 252.9 lb

## 2011-11-13 DIAGNOSIS — L932 Other local lupus erythematosus: Secondary | ICD-10-CM | POA: Insufficient documentation

## 2011-11-13 DIAGNOSIS — Z8673 Personal history of transient ischemic attack (TIA), and cerebral infarction without residual deficits: Secondary | ICD-10-CM | POA: Insufficient documentation

## 2011-11-13 DIAGNOSIS — C519 Malignant neoplasm of vulva, unspecified: Secondary | ICD-10-CM

## 2011-11-13 DIAGNOSIS — N950 Postmenopausal bleeding: Secondary | ICD-10-CM

## 2011-11-13 DIAGNOSIS — Z01419 Encounter for gynecological examination (general) (routine) without abnormal findings: Secondary | ICD-10-CM | POA: Insufficient documentation

## 2011-11-13 DIAGNOSIS — L930 Discoid lupus erythematosus: Secondary | ICD-10-CM

## 2011-11-13 NOTE — Progress Notes (Signed)
Here for annual exam/pap smear. C/o spotted last month, has been postmenopausal about 7 years.

## 2011-11-13 NOTE — Assessment & Plan Note (Signed)
Explained to her the need to endometrial bx to r/o endometrial cancer, especially with HIV.  Unable to visualize cervix today and patient hesitant about having done without pre-medication with ibuprofen today.  Will send for pelvic ultrasound and have her return in two weeks for endometrial bx.  Advised to take ibuprofen prior to appt.

## 2011-11-13 NOTE — Progress Notes (Signed)
Subjective:     Patient ID: Laura Jimenez, female   DOB: [birth year], 61 y.o.   MRN: [id number]  HPI 61 y/o female here for well woman exam with complaint of post menopausal bleeding.  Had bleeding x1 month ago.  Stated this happened on one occurrence and that there was just enough to notice on her toilet paper.  Had some cramping with this as well.  Has not had any bleeding since that time.  She reports LMP ~7 years ago and no bleeding since that time up until last month.  Her medical history is significant for cervical dysplasia and vulvar cancer as well as HIV.   Vulvar lesion was excised by Dr Okey Dupre in 2008 and was found to be Stage I. Review of Systems Denies heavy bleeding, easy bruising, vaginal discharge, dysuria, blood in stool.    Objective:   Physical Exam  Constitutional: She is oriented to person, place, and time.       Obese female, NAD  HENT:  Head: Normocephalic and atraumatic.  Mouth/Throat: Oropharynx is clear and moist.  Neck: Neck supple. No thyromegaly present.  Cardiovascular: Normal rate and regular rhythm.   Pulmonary/Chest: Effort normal and breath sounds normal.  Abdominal: Soft. Bowel sounds are normal. She exhibits no distension. There is no tenderness.  Genitourinary: Vagina normal. Cervix exhibits no motion tenderness.       Low pubic bone, with cervix under pubic bone.  Difficult to visualize using long pederson speculum.  Pap done by manually following to cervix, unsure if endocervical cells obtained.  Neurological: She is alert and oriented to person, place, and time.     Pap done by me. Very difficult to visualize cervix which lies anterior to very low lying pubic bone.  Discussed may need to repeat pap if endocervical cells not seen  ALso discussed need for Endometrial bx due to postmenopausal bleeding. Patient instructed to take ibuprofen prior to arrival, but pt has had difficulties today with following instructions.  She says she is not sure she wants  "to go through with it". I emphasized the importance of ruling out endometrial cancer. She states she understands.   Will return to see [place] CNM to try again.  Artelia Laroche CNM

## 2011-11-13 NOTE — Progress Notes (Signed)
Addended by: Sherre Lain A on: 11/13/2011 05:02 PM   Modules accepted: Orders

## 2011-11-13 NOTE — Patient Instructions (Signed)
Abnormal Uterine Bleeding Abnormal uterine bleeding can have many causes. Some cases are simply treated, while others are more serious. There are several kinds of bleeding that is considered abnormal, including:  Bleeding between periods.   Bleeding after sexual intercourse.   Spotting anytime in the menstrual cycle.   Bleeding heavier or more than normal.   Bleeding after menopause.  CAUSES  There are many causes of abnormal uterine bleeding. It can be present in teenagers, pregnant women, women during their reproductive years, and women who have reached menopause. Your caregiver will look for the more common causes depending on your age, signs, symptoms and your particular circumstance. Most cases are not serious and can be treated. Even the more serious causes, like cancer of the female organs, can be treated adequately if found in the early stages. That is why all types of bleeding should be evaluated and treated as soon as possible. DIAGNOSIS  Diagnosing the cause may take several kinds of tests. Your caregiver may:  Take a complete history of the type of bleeding.   Perform a complete physical exam and Pap smear.   Take an ultrasound on the abdomen showing a picture of the female organs and the pelvis.   Inject dye into the uterus and Fallopian tubes and X-ray them (hysterosalpingogram).   Place fluid in the uterus and do an ultrasound (sonohysterogrqphy).   Take a CT scan to examine the female organs and pelvis.   Take an MRI to examine the female organs and pelvis. There is no X-ray involved with this procedure.   Look inside the uterus with a telescope that has a light at the end (hysteroscopy).   Scrap the inside of the uterus to get tissue to examine (Dilatation and Curettage, D&C).   Look into the pelvis with a telescope that has a light at the end (laparoscopy). This is done through a very small cut (incision) in the abdomen.  TREATMENT  Treatment will depend on the  cause of the abnormal bleeding. It can include:  Doing nothing to allow the problem to take care of itself over time.   Hormone treatment.   Birth control pills.   Treating the medical condition causing the problem.   Laparoscopy.   Major or minor surgery   Destroying the lining of the uterus with electrical currant, laser, freezing or heat (uterine ablation).  HOME CARE INSTRUCTIONS   Follow your caregiver's recommendation on how to treat your problem.   See your caregiver if you missed a menstrual period and think you may be pregnant.   If you are bleeding heavily, count the number of pads/tampons you use and how often you have to change them. Tell this to your caregiver.   Avoid sexual intercourse until the problem is controlled.  SEEK MEDICAL CARE IF:   You have any kind of abnormal bleeding mentioned above.   You feel dizzy at times.   You are 61 years old and have not had a menstrual period yet.  SEEK IMMEDIATE MEDICAL CARE IF:   You pass out.   You are changing pads/tampons every 15 to 30 minutes.   You have belly (abdominal) pain.   You have a temperature of 100 F (37.8 C) or higher.   You become sweaty or weak.   You are passing large blood clots from the vagina.   You start to feel sick to your stomach (nauseous) and throw up (vomit).  Document Released: 08/11/2005 Document Revised: 07/31/2011 Document Reviewed: 01/04/2009 [employer]   Patient Information 2012 [employer]. 

## 2011-11-18 ENCOUNTER — Ambulatory Visit (HOSPITAL_COMMUNITY)
Admission: RE | Admit: 2011-11-18 | Discharge: 2011-11-18 | Disposition: A | Discharge status: Home / Self Care | Payer: Medicaid Other | Source: Ambulatory Visit | Attending: Advanced Practice Midwife | Admitting: Advanced Practice Midwife

## 2011-11-18 DIAGNOSIS — N950 Postmenopausal bleeding: Secondary | ICD-10-CM | POA: Insufficient documentation

## 2011-11-18 DIAGNOSIS — D259 Leiomyoma of uterus, unspecified: Secondary | ICD-10-CM | POA: Insufficient documentation

## 2011-12-04 ENCOUNTER — Encounter: Payer: Self-pay | Admitting: Physician Assistant

## 2011-12-04 ENCOUNTER — Ambulatory Visit (INDEPENDENT_AMBULATORY_CARE_PROVIDER_SITE_OTHER): Payer: Medicaid Other | Admitting: Obstetrics & Gynecology

## 2011-12-04 VITALS — BP 148/74 | HR 61 | Temp 98.4°F | Ht 63.75 in | Wt 259.1 lb

## 2011-12-04 DIAGNOSIS — N950 Postmenopausal bleeding: Secondary | ICD-10-CM

## 2011-12-04 MED ORDER — MISOPROSTOL 200 MCG PO TABS: ORAL_TABLET | ORAL | Status: DC

## 2011-12-04 NOTE — Progress Notes (Signed)
  Subjective:    Patient ID: Laura Jimenez, female    DOB: 1951/05/11, 61 y.o.   MRN: [id number]  HPI Laura Jimenez is a 61 yo HIV+ lady who had PMB recently. She was seen here in the clinic and an attempt was made to do an [facility] but she was unable to tolerate the discomfort associated with the exam. Her u/s shows a 9 mm endometrial lining.   Review of Systems Pap normal 2013    Objective:   Physical Exam        Assessment & Plan:  PMB with thickened endometrium. She will need a H/S and D&C. I will prescribe cytotec for the night before surgery.

## 2011-12-04 NOTE — Progress Notes (Signed)
States does not want to have endometrial biopsy

## 2013-01-10 ENCOUNTER — Ambulatory Visit (INDEPENDENT_AMBULATORY_CARE_PROVIDER_SITE_OTHER): Payer: Medicaid Other | Admitting: Obstetrics & Gynecology

## 2013-01-10 ENCOUNTER — Other Ambulatory Visit (HOSPITAL_COMMUNITY)
Admission: RE | Admit: 2013-01-10 | Discharge: 2013-01-10 | Disposition: A | Discharge status: Home / Self Care | Payer: Medicaid Other | Source: Ambulatory Visit | Attending: Obstetrics & Gynecology | Admitting: Obstetrics & Gynecology

## 2013-01-10 ENCOUNTER — Encounter: Payer: Self-pay | Admitting: Obstetrics & Gynecology

## 2013-01-10 VITALS — BP 138/70 | HR 68 | Temp 98.3°F | Ht 64.5 in | Wt 272.8 lb

## 2013-01-10 DIAGNOSIS — N950 Postmenopausal bleeding: Secondary | ICD-10-CM

## 2013-01-10 DIAGNOSIS — Z1151 Encounter for screening for human papillomavirus (HPV): Secondary | ICD-10-CM | POA: Insufficient documentation

## 2013-01-10 DIAGNOSIS — Z0000 Encounter for general adult medical examination without abnormal findings: Secondary | ICD-10-CM

## 2013-01-10 DIAGNOSIS — Z01419 Encounter for gynecological examination (general) (routine) without abnormal findings: Secondary | ICD-10-CM | POA: Insufficient documentation

## 2013-01-10 NOTE — Progress Notes (Signed)
Referred here for pap- her medical doctor wants her to follow up because of history of abnormal paps. Was supposed to have surgery last year, but states never heard back appointment. Per chart review was having postmenopausal bleeding , thickened endometrium and could not tolerate an endometrial biopsy- plan was to take cytotec and have H&S, D&C.  Patient states since then has not had any postmenopausal bleeding, but has had cramps and pain.

## 2013-01-10 NOTE — Progress Notes (Signed)
Subjective:    Laura Jimenez is a 62 y.o. female who presents for an annual exam. The patient has no complaints today. She never had her h/s and d&c (2013) but she also has had no more PMB since that time.  The patient is not currently sexually active. GYN screening history: last pap: was normal. The patient wears seatbelts: yes. The patient participates in regular exercise: no. Has the patient ever been transfused or tattooed?: not asked. The patient reports that there is not domestic violence in her life.   Menstrual History: OB History   Grav Para Term Preterm Abortions TAB SAB Ect Mult Living   3 2 2  1  1          Menarche age: 57  No LMP recorded. Patient is postmenopausal.    The following portions of the patient's history were reviewed and updated as appropriate: allergies, current medications, past family history, past medical history, past social history, past surgical history and problem list.  Review of Systems A comprehensive review of systems was negative.  She lives with her grown son and daughter.   Objective:    BP 138/70  Pulse 68  Temp(Src) 98.3 F (36.8 C)  Ht 5' 4.5" (1.638 m)  Wt 272 lb 12.8 oz (123.741 kg)  BMI 46.12 kg/m2  General Appearance:    Alert, cooperative, no distress, appears stated age  Head:    Normocephalic, without obvious abnormality, atraumatic  Eyes:    PERRL, conjunctiva/corneas clear, EOM's intact, fundi    benign, both eyes  Ears:    Normal TM's and external ear canals, both ears  Nose:   Nares normal, septum midline, mucosa normal, no drainage    or sinus tenderness  Throat:   Lips, mucosa, and tongue normal; teeth and gums normal  Neck:   Supple, symmetrical, trachea midline, no adenopathy;    thyroid:  no enlargement/tenderness/nodules; no carotid   bruit or JVD  Back:     Symmetric, no curvature, ROM normal, no CVA tenderness  Lungs:     Clear to auscultation bilaterally, respirations unlabored  Chest Wall:    No tenderness or  deformity   Heart:    Regular rate and rhythm, S1 and S2 normal, no murmur, rub   or gallop  Breast Exam:    No tenderness, masses, or nipple abnormality  Abdomen:     Soft, non-tender, bowel sounds active all four quadrants,    no masses, no organomegaly, morbid centripital obesity  Genitalia:    Normal female without lesion, discharge or tenderness, no pelvic masses appreciated     Extremities:   Extremities normal, atraumatic, no cyanosis or edema  Pulses:   2+ and symmetric all extremities  Skin:   Skin color, texture, turgor normal, no rashes or lesions  Lymph nodes:   Cervical, supraclavicular, and axillary nodes normal  Neurologic:   CNII-XII intact, normal strength, sensation and reflexes    throughout  .    Assessment:    Healthy female exam.  PMB 1 year ago   Plan:     Mammogram. Pelvic ultrasound. Thin prep Pap smear.  If the u/s shows a thin endometrium, then no further w/u is necessary. If it is thick, I will reschedule her for a h/s and d&c.

## 2013-01-18 ENCOUNTER — Other Ambulatory Visit (HOSPITAL_COMMUNITY): Payer: Medicaid Other

## 2013-01-18 ENCOUNTER — Ambulatory Visit (HOSPITAL_COMMUNITY)
Admission: RE | Admit: 2013-01-18 | Discharge: 2013-01-18 | Disposition: A | Discharge status: Home / Self Care | Payer: Medicaid Other | Source: Ambulatory Visit | Attending: Obstetrics & Gynecology | Admitting: Obstetrics & Gynecology

## 2013-01-18 DIAGNOSIS — N840 Polyp of corpus uteri: Secondary | ICD-10-CM | POA: Insufficient documentation

## 2013-01-18 DIAGNOSIS — N950 Postmenopausal bleeding: Secondary | ICD-10-CM

## 2013-01-18 DIAGNOSIS — Z1231 Encounter for screening mammogram for malignant neoplasm of breast: Secondary | ICD-10-CM | POA: Insufficient documentation

## 2013-01-18 DIAGNOSIS — D251 Intramural leiomyoma of uterus: Secondary | ICD-10-CM | POA: Insufficient documentation

## 2013-01-18 DIAGNOSIS — Z0000 Encounter for general adult medical examination without abnormal findings: Secondary | ICD-10-CM

## 2013-01-31 ENCOUNTER — Telehealth: Payer: Self-pay | Admitting: *Deleted

## 2013-01-31 NOTE — Telephone Encounter (Signed)
Called Laura Jimenez and reviewed her ultrasound results with her and instructed her to keep her follow up appointment to discuss results and plan of care based on the results with her doctor. Patient states not aware of appointment- reviewed appointment date and time with her.   Patient c/o upper abdominal pain and states is taking her tramadol with some relief- encouraged her to contact her primary care MD as upper abdominal pain is not likely gyn related, but if her pain become severe to come to [facility] if she feels it is gyn related or ER if it is not gyn. Patient voices understanding

## 2013-01-31 NOTE — Telephone Encounter (Signed)
Patient left a message requesting ultrasound results.

## 2013-02-14 ENCOUNTER — Encounter: Payer: Self-pay | Admitting: Obstetrics & Gynecology

## 2013-02-14 ENCOUNTER — Ambulatory Visit (INDEPENDENT_AMBULATORY_CARE_PROVIDER_SITE_OTHER): Payer: Medicaid Other | Admitting: Obstetrics & Gynecology

## 2013-02-14 VITALS — BP 127/73 | HR 66 | Temp 97.8°F | Ht 65.0 in | Wt 270.0 lb

## 2013-02-14 DIAGNOSIS — D259 Leiomyoma of uterus, unspecified: Secondary | ICD-10-CM

## 2013-02-14 DIAGNOSIS — N950 Postmenopausal bleeding: Secondary | ICD-10-CM

## 2013-02-14 DIAGNOSIS — D219 Benign neoplasm of connective and other soft tissue, unspecified: Secondary | ICD-10-CM

## 2013-02-14 MED ORDER — MISOPROSTOL 200 MCG PO TABS: ORAL_TABLET | ORAL | Status: DC

## 2013-02-14 NOTE — Progress Notes (Signed)
  Subjective:    Patient ID: Laura Jimenez, female    DOB: 10/14/1950, 62 y.o.   MRN: [id number]  HPI 62 yo S AA HIV+ lady with PMB diagnosed 11/2011. She did not get the d&c and hysteroscopy last year as was scheduled. She was seen here last month for her annual exam and denied any further PMB. I did an u/s which could not fully evaluate the uterine lining due to shadowing from fibroids. I will schedule her for a d&c/H/S. I have ordered cytotect to be taken orally the night prior to her surgery.  She reports irregular pelvic pains that occur about 1-2 times per month. It lasts only seconds and spontaneously resolved.  She also epigastric discomfort. She was prescribed an "acid reflux medicine" which helps.   Review of Systems  Pap and mammogram 5/14 were normal.    Objective:   Physical Exam        Assessment & Plan:  PMB 2013 with inadequately evaluated endometrium. I will reschedule her d&c and hysteroscopy.

## 2013-02-14 NOTE — Patient Instructions (Signed)
Dilation and Curettage or Vacuum Curettage Dilation and curettage (D&C) and vacuum curettage are minor procedures. A D&C involves stretching (dilation) the cervix and scraping (curettage) the inside lining of the womb (uterus). During a D&C, tissue is gently scraped from the inside lining of the uterus. During a vacuum curettage, the lining and tissue in the uterus are removed with the use of gentle suction. Curettage may be performed for diagnostic or therapeutic purposes. As a diagnostic procedure, curettage is performed for the purpose of examining tissues from the uterus. Tissue examination may help determine causes or treatment options for symptoms. A diagnostic curettage may be performed for the following symptoms:  Irregular bleeding in the uterus.  Bleeding with the development of clots.  Spotting between menstrual periods.  Prolonged menstrual periods.  Bleeding after menopause.  No menstrual period (amenorrhea).  A change in size and shape of the uterus. A therapeutic curettage is performed to remove tissue, blood, or a contraceptive device. Therapeutic curettage may be performed for the following conditions:   Removal of an IUD (intrauterine device).  Removal of retained placenta after giving birth. Retained placenta can cause bleeding severe enough to require transfusions or an infection.  Abortion.  Miscarriage.  Removal of polyps inside the uterus.  Removal of uncommon types of fibroids (noncancerous lumps). LET YOUR CAREGIVER KNOW ABOUT:   Allergies to food or medicine.  Medicines taken, including vitamins, herbs, eyedrops, over-the-counter medicines, and creams.  Use of steroids (by mouth or creams).  Previous problems with anesthetics or numbing medicines.  History of bleeding problems or blood clots.  Previous surgery.  Other health problems, including diabetes and kidney problems.  Possibility of pregnancy, if this applies. RISKS AND COMPLICATIONS    Excessive bleeding.  Infection of the uterus.  Damage to the cervix.  Development of scar tissue (adhesions) inside the uterus, later causing abnormal amounts of menstrual bleeding.  Complications from the general anesthetic, if a general anesthetic is used.  Putting a hole (perforation) in the uterus. This is rare. BEFORE THE PROCEDURE   Eat and drink before the procedure only as directed by your caregiver.  Arrange for someone to take you home. PROCEDURE   This procedure may be done in a hospital, outpatient clinic, or caregiver's office.  You may be given a general anesthetic or a local anesthetic in and around the cervix.  You will lie on your back with your legs in stirrups.  There are two ways in which your cervix can be softened and dilated. These include:  Taking a medicine.  Having thin rods (laminaria) inserted into your cervix.  A curved tool (curette) will scrape cells from the inside lining of the uterus and will then be removed. This procedure usually takes about 15 to 30 minutes. AFTER THE PROCEDURE   You will rest in the recovery area until you are stable and are ready to go home.  You will need to have someone take you home.  You may feel sick to your stomach (nauseous) or throw up (vomit) if you had general anesthesia.  You may have a sore throat if a tube was placed in your throat during general anesthesia.  You may have light cramping and bleeding for 2 days to 2 weeks after the procedure.  Your uterus needs to make a new lining after the procedure. This may make your next period late. Document Released: 08/11/2005 Document Revised: 11/03/2011 Document Reviewed: 03/09/2009 [facility] Patient Information 2014 [place], [place]. Fibroids Fibroids are lumps (tumors) that  can occur any place in a woman's body. These lumps are not cancerous. Fibroids vary in size, weight, and where they grow. HOME CARE  Do not take aspirin.  Write down the number of  pads or tampons you use during your period. Tell your doctor. This can help determine the best treatment for you. GET HELP RIGHT AWAY IF:  You have pain in your lower belly (abdomen) that is not helped with medicine.  You have cramps that are not helped with medicine.  You have more bleeding between or during your period.  You feel lightheaded or pass out (faint).  Your lower belly pain gets worse. MAKE SURE YOU:  Understand these instructions.  Will watch your condition.  Will get help right away if you are not doing well or get worse. Document Released: 09/13/2010 Document Revised: 11/03/2011 Document Reviewed: 09/13/2010 [facility] Patient Information 2014 [place], [place].

## 2013-04-04 ENCOUNTER — Ambulatory Visit: Payer: Medicaid Other | Admitting: Obstetrics & Gynecology

## 2013-04-07 ENCOUNTER — Encounter (HOSPITAL_COMMUNITY): Payer: Self-pay | Admitting: Pharmacy Technician

## 2013-04-13 ENCOUNTER — Encounter (HOSPITAL_COMMUNITY)
Admission: RE | Admit: 2013-04-13 | Discharge: 2013-04-13 | Disposition: A | Discharge status: Home / Self Care | Payer: Medicaid Other | Source: Ambulatory Visit | Attending: Obstetrics & Gynecology | Admitting: Obstetrics & Gynecology

## 2013-04-13 ENCOUNTER — Encounter (HOSPITAL_COMMUNITY): Payer: Self-pay

## 2013-04-13 ENCOUNTER — Other Ambulatory Visit: Payer: Self-pay

## 2013-04-13 ENCOUNTER — Encounter: Payer: Self-pay | Admitting: Obstetrics & Gynecology

## 2013-04-13 ENCOUNTER — Ambulatory Visit (INDEPENDENT_AMBULATORY_CARE_PROVIDER_SITE_OTHER): Payer: Medicaid Other | Admitting: Obstetrics & Gynecology

## 2013-04-13 VITALS — BP 139/74 | HR 63 | Ht 65.0 in | Wt 266.1 lb

## 2013-04-13 DIAGNOSIS — Z01812 Encounter for preprocedural laboratory examination: Secondary | ICD-10-CM | POA: Insufficient documentation

## 2013-04-13 DIAGNOSIS — Z01810 Encounter for preprocedural cardiovascular examination: Secondary | ICD-10-CM | POA: Insufficient documentation

## 2013-04-13 DIAGNOSIS — N950 Postmenopausal bleeding: Secondary | ICD-10-CM

## 2013-04-13 DIAGNOSIS — Z01818 Encounter for other preprocedural examination: Secondary | ICD-10-CM | POA: Insufficient documentation

## 2013-04-13 HISTORY — DX: Cerebral infarction, unspecified: I63.9

## 2013-04-13 LAB — CBC
HCT: 34.8 % — ABNORMAL LOW (ref 36.0–46.0)
Hemoglobin: 11.2 g/dL — ABNORMAL LOW (ref 12.0–15.0)
MCH: 30.2 pg (ref 26.0–34.0)
MCHC: 32.2 g/dL (ref 30.0–36.0)
MCV: 93.8 fL (ref 78.0–100.0)

## 2013-04-13 LAB — BASIC METABOLIC PANEL
BUN: 15 mg/dL (ref 6–23)
Calcium: 9.5 mg/dL (ref 8.4–10.5)
Creatinine, Ser: 0.83 mg/dL (ref 0.50–1.10)
GFR calc non Af Amer: 75 mL/min — ABNORMAL LOW (ref >90)
Glucose, Bld: 94 mg/dL (ref 70–99)
Sodium: 139 mEq/L (ref 135–145)

## 2013-04-13 MED ORDER — MISOPROSTOL 200 MCG PO TABS: ORAL_TABLET | ORAL | Status: DC

## 2013-04-13 NOTE — Progress Notes (Signed)
  Subjective:    Patient ID: Laura Jimenez, female    DOB: 1951-07-19, 62 y.o.   MRN: [id number]  HPI  62 yo lady here for a pre op visit for PMB. She will take cytotec po the night before surgery.  Review of Systems     Objective:   Physical Exam        Assessment & Plan:   PMB- plan for h/s, d&c

## 2013-04-13 NOTE — Patient Instructions (Addendum)
Your procedure is scheduled on:04/19/13  Enter through the Main Entrance at :2 pm Pick up desk phone and dial [phone] and inform us of your arrival.  Please call [phone] if you have any problems the morning of surgery.  Remember: Do not eat food after midnight:Monday Clear liquids are ok until:1130 am Tuesday   You may brush your teeth the morning of surgery.  Take these meds the morning of surgery with a sip of water:  DO NOT wear jewelry, eye make-up, lipstick,body lotion, or dark fingernail polish.  (Polished toes are ok) You may wear deodorant.  If you are to be admitted after surgery, leave suitcase in car until your room has been assigned. Patients discharged on the day of surgery will not be allowed to drive home. Wear loose fitting, comfortable clothes for your ride home.

## 2013-04-19 ENCOUNTER — Encounter (HOSPITAL_COMMUNITY): Payer: Self-pay | Admitting: Anesthesiology

## 2013-04-19 ENCOUNTER — Ambulatory Visit (HOSPITAL_COMMUNITY): Payer: Medicaid Other | Admitting: Anesthesiology

## 2013-04-19 ENCOUNTER — Ambulatory Visit (HOSPITAL_COMMUNITY)
Admission: RE | Admit: 2013-04-19 | Discharge: 2013-04-19 | Disposition: A | Discharge status: Home / Self Care | Payer: Medicaid Other | Source: Ambulatory Visit | Attending: Obstetrics & Gynecology | Admitting: Obstetrics & Gynecology

## 2013-04-19 ENCOUNTER — Encounter (HOSPITAL_COMMUNITY): Payer: Self-pay

## 2013-04-19 ENCOUNTER — Encounter (HOSPITAL_COMMUNITY)
Admission: RE | Disposition: A | Discharge status: Home / Self Care | Payer: Self-pay | Source: Ambulatory Visit | Attending: Obstetrics & Gynecology

## 2013-04-19 DIAGNOSIS — Z8673 Personal history of transient ischemic attack (TIA), and cerebral infarction without residual deficits: Secondary | ICD-10-CM | POA: Insufficient documentation

## 2013-04-19 DIAGNOSIS — E119 Type 2 diabetes mellitus without complications: Secondary | ICD-10-CM | POA: Insufficient documentation

## 2013-04-19 DIAGNOSIS — Z21 Asymptomatic human immunodeficiency virus [HIV] infection status: Secondary | ICD-10-CM | POA: Insufficient documentation

## 2013-04-19 DIAGNOSIS — N950 Postmenopausal bleeding: Secondary | ICD-10-CM

## 2013-04-19 DIAGNOSIS — Z881 Allergy status to other antibiotic agents status: Secondary | ICD-10-CM | POA: Insufficient documentation

## 2013-04-19 DIAGNOSIS — E785 Hyperlipidemia, unspecified: Secondary | ICD-10-CM | POA: Insufficient documentation

## 2013-04-19 DIAGNOSIS — M329 Systemic lupus erythematosus, unspecified: Secondary | ICD-10-CM | POA: Insufficient documentation

## 2013-04-19 DIAGNOSIS — Z91040 Latex allergy status: Secondary | ICD-10-CM | POA: Insufficient documentation

## 2013-04-19 DIAGNOSIS — Z91041 Radiographic dye allergy status: Secondary | ICD-10-CM | POA: Insufficient documentation

## 2013-04-19 DIAGNOSIS — D759 Disease of blood and blood-forming organs, unspecified: Secondary | ICD-10-CM | POA: Insufficient documentation

## 2013-04-19 DIAGNOSIS — I10 Essential (primary) hypertension: Secondary | ICD-10-CM | POA: Insufficient documentation

## 2013-04-19 DIAGNOSIS — Z87891 Personal history of nicotine dependence: Secondary | ICD-10-CM | POA: Insufficient documentation

## 2013-04-19 HISTORY — PX: HYSTEROSCOPY WITH D & C: SHX1775

## 2013-04-19 SURGERY — DILATATION AND CURETTAGE /HYSTEROSCOPY
Anesthesia: General | Site: Vagina | Wound class: Clean Contaminated

## 2013-04-19 MED ORDER — BUPIVACAINE HCL (PF) 0.5 % IJ SOLN
INTRAMUSCULAR | Status: AC
  Filled 2013-04-19: qty 30

## 2013-04-19 MED ORDER — BUPIVACAINE HCL (PF) 0.5 % IJ SOLN
INTRAMUSCULAR | Status: DC | PRN
  Administered 2013-04-19: 20 mL

## 2013-04-19 MED ORDER — FENTANYL CITRATE 0.05 MG/ML IJ SOLN: 25.0000 ug | INTRAMUSCULAR | Status: DC | PRN

## 2013-04-19 MED ORDER — PROPOFOL 10 MG/ML IV BOLUS
INTRAVENOUS | Status: DC | PRN
  Administered 2013-04-19: 190 mg via INTRAVENOUS

## 2013-04-19 MED ORDER — LACTATED RINGERS IV SOLN
INTRAVENOUS | Status: DC
  Administered 2013-04-19 (×2): via INTRAVENOUS

## 2013-04-19 MED ORDER — METOCLOPRAMIDE HCL 5 MG/ML IJ SOLN: 10.0000 mg | Freq: Once | INTRAMUSCULAR | Status: DC | PRN

## 2013-04-19 MED ORDER — GLYCINE 1.5 % IR SOLN
Status: DC | PRN
  Administered 2013-04-19: 3000 mL

## 2013-04-19 MED ORDER — LIDOCAINE HCL (CARDIAC) 20 MG/ML IV SOLN
INTRAVENOUS | Status: AC
  Filled 2013-04-19: qty 5

## 2013-04-19 MED ORDER — MIDAZOLAM HCL 2 MG/2ML IJ SOLN
INTRAMUSCULAR | Status: AC
  Filled 2013-04-19: qty 2

## 2013-04-19 MED ORDER — PROPOFOL 10 MG/ML IV EMUL
INTRAVENOUS | Status: AC
  Filled 2013-04-19: qty 20

## 2013-04-19 MED ORDER — FENTANYL CITRATE 0.05 MG/ML IJ SOLN
INTRAMUSCULAR | Status: DC | PRN
  Administered 2013-04-19: 50 ug via INTRAVENOUS

## 2013-04-19 MED ORDER — LIDOCAINE HCL (CARDIAC) 20 MG/ML IV SOLN
INTRAVENOUS | Status: DC | PRN
  Administered 2013-04-19: 50 mg via INTRAVENOUS

## 2013-04-19 MED ORDER — MIDAZOLAM HCL 5 MG/5ML IJ SOLN
INTRAMUSCULAR | Status: DC | PRN
  Administered 2013-04-19: 1 mg via INTRAVENOUS

## 2013-04-19 MED ORDER — ONDANSETRON HCL 4 MG/2ML IJ SOLN
INTRAMUSCULAR | Status: DC | PRN
  Administered 2013-04-19: 4 mg via INTRAVENOUS

## 2013-04-19 MED ORDER — IBUPROFEN 800 MG PO TABS: 800.0000 mg | ORAL_TABLET | Freq: Three times a day (TID) | ORAL | Status: DC | PRN

## 2013-04-19 MED ORDER — FENTANYL CITRATE 0.05 MG/ML IJ SOLN
INTRAMUSCULAR | Status: AC
  Filled 2013-04-19: qty 2

## 2013-04-19 MED ORDER — ONDANSETRON HCL 4 MG/2ML IJ SOLN
INTRAMUSCULAR | Status: AC
  Filled 2013-04-19: qty 2

## 2013-04-19 MED ORDER — MEPERIDINE HCL 25 MG/ML IJ SOLN: 6.2500 mg | INTRAMUSCULAR | Status: DC | PRN

## 2013-04-19 SURGICAL SUPPLY — 19 items
CANISTER SUCTION 2500CC (MISCELLANEOUS) ×2 IMPLANT
CATH FOLEY 2WAY  5CC 16FR SIL (CATHETERS) ×1
CATH FOLEY 2WAY 5CC 16FR SIL (CATHETERS) IMPLANT
CATH ROBINSON RED A/P 16FR (CATHETERS) ×1 IMPLANT
CLOTH BEACON ORANGE TIMEOUT ST (SAFETY) ×2 IMPLANT
CONTAINER PREFILL 10% NBF 60ML (FORM) ×3 IMPLANT
DILATOR CANAL MILEX (MISCELLANEOUS) IMPLANT
DRESSING TELFA 8X3 (GAUZE/BANDAGES/DRESSINGS) ×2 IMPLANT
ELECT REM PT RETURN 9FT ADLT (ELECTROSURGICAL) ×2
ELECTRODE REM PT RTRN 9FT ADLT (ELECTROSURGICAL) IMPLANT
GLOVE BIO SURGEON STRL SZ 6.5 (GLOVE) ×1 IMPLANT
GLOVE SKINSENSE NS SZ6.5 (GLOVE) ×2
GLOVE SKINSENSE STRL SZ6.5 (GLOVE) IMPLANT
GOWN STRL REIN XL XLG (GOWN DISPOSABLE) ×4 IMPLANT
LOOP ANGLED CUTTING 22FR (CUTTING LOOP) IMPLANT
PACK HYSTEROSCOPY LF (CUSTOM PROCEDURE TRAY) ×2 IMPLANT
PAD OB MATERNITY 4.3X12.25 (PERSONAL CARE ITEMS) ×2 IMPLANT
TOWEL OR 17X24 6PK STRL BLUE (TOWEL DISPOSABLE) ×4 IMPLANT
WATER STERILE IRR 1000ML POUR (IV SOLUTION) ×2 IMPLANT

## 2013-04-19 NOTE — Transfer of Care (Signed)
Immediate Anesthesia Transfer of Care Note  Patient: Laura Jimenez  Procedure(s) Performed: Procedure(s): DILATATION AND CURETTAGE /HYSTEROSCOPY (N/A)  Patient Location: PACU  Anesthesia Type:General  Level of Consciousness: awake  Airway & Oxygen Therapy: Patient Spontanous Breathing  Post-op Assessment: Report given to PACU RN  Post vital signs: stable  Filed Vitals:   04/19/13 1407  BP: 129/59  Pulse: 65  Temp: 36.7 C  Resp: 16    Complications: No apparent anesthesia complications

## 2013-04-19 NOTE — Anesthesia Postprocedure Evaluation (Signed)
  Anesthesia Post-op Note  Patient: Laura Jimenez  Procedure(s) Performed: Procedure(s): DILATATION AND CURETTAGE /HYSTEROSCOPY (N/A)  Patient Location: PACU  Anesthesia Type:General  Level of Consciousness: awake, oriented and sedated  Airway and Oxygen Therapy: Patient Spontanous Breathing  Post-op Pain: none  Post-op Assessment: Post-op Vital signs reviewed, Patient's Cardiovascular Status Stable, Respiratory Function Stable, Patent Airway, No signs of Nausea or vomiting and Pain level controlled  Post-op Vital Signs: Reviewed and stable  Complications: No apparent anesthesia complications

## 2013-04-19 NOTE — Op Note (Signed)
04/19/2013  3:58 PM  PATIENT:  Laura Jimenez  62 y.o. female  PRE-OPERATIVE DIAGNOSIS:  Post menopausal bleeding  POST-OPERATIVE DIAGNOSIS:  Post menopausal bleeding  PROCEDURE:  Procedure(s): DILATATION AND CURETTAGE /HYSTEROSCOPY (N/A)  SURGEON:  Surgeon(s) and Role:    * Allie Bossier, MD - Primary  PHYSICIAN ASSISTANT:   ASSISTANTS: none   ANESTHESIA:   general  EBL:  Total I/O In: 700 [I.V.:700] Out: 35 [Urine:30; Blood:5]  BLOOD ADMINISTERED:none  DRAINS: none   LOCAL MEDICATIONS USED:  MARCAINE     SPECIMEN:  Source of Specimen:  uterine curettings  DISPOSITION OF SPECIMEN:  PATHOLOGY  COUNTS:  YES  TOURNIQUET:  * No tourniquets in log *  DICTATION: .Dragon Dictation  PLAN OF CARE: Discharge to home after PACU  PATIENT DISPOSITION:  PACU - hemodynamically stable.   Delay start of Pharmacological VTE agent (>24hrs) due to surgical blood loss or risk of bleeding: not applicable           The risks, benefits, and alternatives of surgery were explained, understood, and accepted. All questions were answered. Consents were signed. The operating room general anesthesia was applied without complication. She was placed in the dorsal lithotomy position and her vagina was prepped and draped in the usual sterile fashion. A latex-free catheter was used to drain her bladder. A bimanual exam revealed a normal size and shape, anteverted mobile uterus. Her adnexa were nonenlarged. A speculum was placed and a single-tooth tenaculum was used to grasp the anterior lip of her nulliparous cervix. A total of 20 mL of 0.5% Marcaine was used to perform a paracervical block. Her uterus sounded to 7 cm. Her cervix was carefully and slowly and easily dilated to accommodate a small curet. Hysteroscopy revealed a normal post menopausal atrophic uterine cavity. A curettage was done in all quadrants and the fundus of the uterus. A small amount of tissue was obtained. There was no  bleeding noted at the end of the case. She was taken to the recovery room after being extubated. She tolerated the procedure well.

## 2013-04-19 NOTE — Anesthesia Preprocedure Evaluation (Signed)
Anesthesia Evaluation  Patient identified by MRN, date of birth, ID band Patient awake    Reviewed: Allergy & Precautions, H&P , NPO status , Patient's Chart, lab work & pertinent test results  Airway Mallampati: III TM Distance: >3 FB Neck ROM: Full    Dental  (+) Upper Dentures   Pulmonary  Snores breath sounds clear to auscultation  Pulmonary exam normal       Cardiovascular hypertension, Pt. on medications Rhythm:Regular Rate:Normal     Neuro/Psych  Neuromuscular disease CVA, No Residual Symptoms negative psych ROS   GI/Hepatic negative GI ROS, Neg liver ROS,   Endo/Other  diabetes, Well Controlled, Type 2, Oral Hypoglycemic AgentsMorbid obesityHyperlipidemia  Renal/GU negative Renal ROS  negative genitourinary   Musculoskeletal negative musculoskeletal ROS (+)   Abdominal (+) + obese,   Peds  Hematology  (+) Blood dyscrasia, , HIV infection Cutaneous Lupus   Anesthesia Other Findings   Reproductive/Obstetrics PMB                           Anesthesia Physical Anesthesia Plan  ASA: III  Anesthesia Plan: General   Post-op Pain Management:    Induction: Intravenous  Airway Management Planned: LMA  Additional Equipment:   Intra-op Plan:   Post-operative Plan: Extubation in OR  Informed Consent: I have reviewed the patients History and Physical, chart, labs and discussed the procedure including the risks, benefits and alternatives for the proposed anesthesia with the patient or authorized representative who has indicated his/her understanding and acceptance.   Dental advisory given  Plan Discussed with: CRNA, Anesthesiologist and Surgeon  Anesthesia Plan Comments:         Anesthesia Quick Evaluation

## 2013-04-19 NOTE — H&P (Signed)
62 yo S AA HIV+ lady with PMB diagnosed 11/2011. She did not get the d&c and hysteroscopy last year as was scheduled. She was seen here last month for her annual exam and denied any further PMB. I did an u/s which could not fully evaluate the uterine lining due to shadowing from fibroids.She is here for a d&c/H/S. I have ordered cytotect to be taken orally the night prior to her        Menstrual History: Menarche age: 80 No LMP recorded. Patient is postmenopausal.    Past Medical History  Diagnosis Date  . HTN (hypertension)   . CVA (cerebral infarction)   . Hyperlipidemia   . HIV infection   . Postmenopausal bleeding   . Stroke     no residual paralysis    Past Surgical History  Procedure Laterality Date  . Colonoscopy  2000  . Cervical cancer      Family History  Problem Relation Age of Onset  . Hypertension Sister   . Arthritis Sister     Social History:  reports that she has quit smoking. She has never used smokeless tobacco. She reports that she does not drink alcohol or use illicit drugs.  Allergies:  Allergies  Allergen Reactions  . Ivp Dye [Iodinated Diagnostic Agents] Other (See Comments)    Caused kidney damage  . Cephalexin Rash  . Latex Rash    Prescriptions prior to admission  Medication Sig Dispense Refill  . amitriptyline (ELAVIL) 10 MG tablet Take 10 mg by mouth at bedtime.      Marland Kitchen Bioflavonoid Products (VITAMIN C) CHEW Chew 1 tablet by mouth daily.      . cloNIDine (CATAPRES) 0.1 MG tablet Take 0.1 mg by mouth 2 (two) times daily.      . darunavir (PREZISTA) 600 MG tablet Take 600 mg by mouth 2 (two) times daily with a meal.      . emtricitabine (EMTRIVA) 200 MG capsule Take 200 mg by mouth daily.      Marland Kitchen HYDROcodone-acetaminophen (NORCO/VICODIN) 5-325 MG per tablet Take 1 tablet by mouth every 6 (six) hours as needed for pain.      Marland Kitchen lisinopril-hydrochlorothiazide (PRINZIDE,ZESTORETIC) 10-12.5 MG per tablet Take 1 tablet by mouth daily.      .  misoprostol (CYTOTEC) 200 MCG tablet Take 600 mcg (3 200 mcg tablets) the night before surgery  3 tablet  0  . Pediatric Multiple Vitamins (CHEWABLE MULTIPLE VITAMINS PO) Take 1 tablet by mouth daily.      . pravastatin (PRAVACHOL) 40 MG tablet Take 40 mg by mouth daily.      . raltegravir (ISENTRESS) 400 MG tablet Take 400 mg by mouth 2 (two) times daily.      . ritonavir (NORVIR) 100 MG TABS Take by mouth 2 (two) times daily with a meal.        ROS She lives with her son and daughter.  Blood pressure 129/59, pulse 65, temperature 98.1 F (36.7 C), temperature source Oral, resp. rate 16, SpO2 98.00%. Physical Exam Heart- rrr Lungs- CTAB Abd- benign, obese  No results found for this or any previous visit (from the past 24 hour(s)).  No results found.  Assessment/Plan: PMB- plan for hysteroscopy, dilation and curettage  She understands the risks of surgery, including, but not to infection, bleeding, DVTs, damage to bowel, bladder, ureters. She wishes to proceed.    Paulo Keimig C. 04/19/2013, 2:51 PM

## 2013-04-20 ENCOUNTER — Encounter (HOSPITAL_COMMUNITY): Payer: Self-pay | Admitting: Obstetrics & Gynecology

## 2013-06-03 ENCOUNTER — Encounter: Payer: Self-pay | Admitting: Obstetrics & Gynecology

## 2013-06-03 ENCOUNTER — Ambulatory Visit (INDEPENDENT_AMBULATORY_CARE_PROVIDER_SITE_OTHER): Payer: Medicaid Other | Admitting: Obstetrics & Gynecology

## 2013-06-03 VITALS — BP 160/81 | HR 71 | Temp 97.2°F | Ht 65.0 in | Wt 262.5 lb

## 2013-06-03 DIAGNOSIS — Z09 Encounter for follow-up examination after completed treatment for conditions other than malignant neoplasm: Secondary | ICD-10-CM

## 2013-06-03 NOTE — Patient Instructions (Signed)

## 2013-06-03 NOTE — Progress Notes (Signed)
Pt. Did not take BP medication this morning.

## 2013-06-03 NOTE — Progress Notes (Signed)
  Subjective:    Patient ID: Laura Jimenez, female    DOB: 08-Oct-1950, 62 y.o.   MRN: [id number]  HPI  62 yo AA lady here for her 6 week po check. She had an uncomplicated d&c/hysteroscopy about 6 weeks ago for PMB. She has not had bleeding since then. She has no complaints.  Review of Systems Her mammogram was normal 6 months ago, she tells me.    Objective:   Physical Exam        Assessment & Plan:  PO- doing well. No more PMB noted. Her pathology showed no atypia or malignancy, but only smooth muscle was seen. Her u/s had showed a lining of 3.8 mm. I have given her a copy of these results and told her that if PMB recurs, then she should return for another gyn u/s.

## 2014-06-07 ENCOUNTER — Other Ambulatory Visit: Payer: Self-pay | Admitting: Orthopaedic Surgery

## 2014-06-07 DIAGNOSIS — M25512 Pain in left shoulder: Secondary | ICD-10-CM

## 2014-06-19 ENCOUNTER — Other Ambulatory Visit: Payer: Medicaid Other

## 2014-06-19 ENCOUNTER — Ambulatory Visit
Admission: RE | Admit: 2014-06-19 | Discharge: 2014-06-19 | Disposition: A | Discharge status: Home / Self Care | Payer: Medicaid Other | Source: Ambulatory Visit | Attending: Orthopaedic Surgery | Admitting: Orthopaedic Surgery

## 2014-06-19 DIAGNOSIS — M25512 Pain in left shoulder: Secondary | ICD-10-CM

## 2014-06-26 ENCOUNTER — Encounter: Payer: Self-pay | Admitting: Obstetrics & Gynecology

## 2014-08-15 ENCOUNTER — Ambulatory Visit (HOSPITAL_COMMUNITY)
Admission: RE | Admit: 2014-08-15 | Discharge: 2014-08-15 | Disposition: A | Discharge status: Home / Self Care | Payer: Medicaid Other | Source: Ambulatory Visit | Attending: Orthopaedic Surgery | Admitting: Orthopaedic Surgery

## 2014-08-15 DIAGNOSIS — M25512 Pain in left shoulder: Secondary | ICD-10-CM | POA: Insufficient documentation

## 2014-08-15 DIAGNOSIS — Z5189 Encounter for other specified aftercare: Secondary | ICD-10-CM | POA: Insufficient documentation

## 2014-08-15 NOTE — Patient Instructions (Signed)
       Copyright  VHI. All rights reserved.   Strengthening: Resisted Extension   Hold tubing in right hand, arm forward. Pull arm back, elbow straight. Repeat ____ times per set. Do ____ sets per session. Do ____ sessions per day.  http://orth.exer.us/832   Copyright  VHI. All rights reserved.   Strengthening: Resisted External Rotation   Hold tubing in right hand, elbow at side and forearm across body. Rotate forearm out. Repeat ____ times per set. Do ____ sets per session. Do ____ sessions per day.  http://orth.exer.us/828   Copyright  VHI. All rights reserved.   Strengthening: Resisted Flexion   Hold tubing with left arm at side. Pull forward and up. Move shoulder through pain-free range of motion. Repeat ____ times per set. Do ____ sets per session. Do ____ sessions per day.  http://orth.exer.us/824   Copyright  VHI. All rights reserved.   Strengthening: Resisted Horizontal Abduction  Reverse Fly / Shoulder Retraction   Extend both arms in front of body at shoulder height, palms down, holding band. Move arms out to sides, squeeze shoulder blades together. Repeat ___ times. Do ___ sessions per day.   Copyright  VHI. All rights reserved.  Strengthening: Resisted Internal Rotation   Hold tubing in left hand, elbow at side and forearm out. Rotate forearm in across body. Repeat ____ times per set. Do ____ sets per session. Do ____ sessions per day.  http://orth.exer.us/831   Copyright  VHI. All rights reserved.  opyright  VHI. All rights reserved.

## 2014-08-15 NOTE — Therapy (Signed)
[facility] [place], [place], [address] Phone: [phone]   Fax:  [phone]  Occupational Therapy Evaluation  Patient Details  Name: Laura Jimenez MRN: [id number] Date of Birth: 1951-04-25  Encounter Date: 08/15/2014      OT End of Session - 08/15/14 1338    Visit Number 1   Number of Visits 1   Date for OT Re-Evaluation 08/22/14   Authorization Type Medicaid - one visit authorized   OT Start Time 1115   OT Stop Time 1145   OT Time Calculation (min) 30 min   Activity Tolerance Patient tolerated treatment well   Behavior During Therapy [facility] for tasks assessed/performed      Past Medical History  Diagnosis Date  . HTN (hypertension)   . CVA (cerebral infarction)   . Hyperlipidemia   . HIV infection   . Postmenopausal bleeding   . Stroke     no residual paralysis    Past Surgical History  Procedure Laterality Date  . Colonoscopy  2000  . Cervical cancer    . Hysteroscopy w/d&c N/A 04/19/2013    Procedure: DILATATION AND CURETTAGE /HYSTEROSCOPY;  Surgeon: Emily Filbert, MD;  Location: [facility];  Service: Gynecology;  Laterality: N/A;    There were no vitals taken for this visit.  Visit Diagnosis:  Pain in joint, shoulder region, left      Subjective Assessment - 08/15/14 1201    Symptoms S:  Since I had another shot, it hasnt hurt me at all.   Pertinent History Laura Jimenez reports that she began experiencing left shoulder pain approximately 1 year ago.  She consulted with Dr. Durward Fortes and received a cortisone injection.  She had relief from her pain for almost a year, and then the pain came back.  She consulted with Dr. Remus Blake, had a cortisone injection and an MRI.  The MRI detected arthritic changes to her shoulder.  She has been referred to occupational thearpy for evaluation and treatment for a HEP.   Special Tests n/a   Patient Stated Goals I want to keep the pain away.     Currently in Pain?  No/denies          [facility] OT Assessment - 08/15/14 0001    Assessment   Diagnosis Left Shoulder Pain   Onset Date --  one year ago   Prior Therapy none   Precautions   Precautions None   Restrictions   Weight Bearing Restrictions No   Balance Screen   Has the patient fallen in the past 6 months No   Has the patient had a decrease in activity level because of a fear of falling?  No   Is the patient reluctant to leave their home because of a fear of falling?  No   Home  Environment   Family/patient expects to be discharged to: Private residence   Living Arrangements Children   Prior Function   Level of Independence Independent with basic ADLs   Vocation Unemployed   Leisure enjoys visiting elderly in the nursing home   ADL   ADL comments able to complete all desired activities independently   Written Expression   Dominant Hand Right   Handwriting 100% legible   Vision - History   Baseline Vision Wears glasses only for reading   Cognition   Overall Cognitive Status Within Functional Limits for tasks assessed   Observation/Other Assessments   Observations minimal to trace fascial restrictions noted in left  shoulder region.   Sensation   Light Touch Appears Intact   Coordination   Gross Motor Movements are Fluid and Coordinated Yes   Fine Motor Movements are Fluid and Coordinated Yes   AROM   Overall AROM Comments LUE AROM is WNL for daily activities   Strength   Overall Strength Comments LUE strength is WNL for daily activities           OT Education - 08/15/14 1221    Education provided (p) Yes   Education Details (p) theraband for shoulder extension, flexion, external rotation, internal rotation, and flexion with red theraband.   Person(s) Educated (p) Patient   Methods (p) Explanation;Demonstration;Verbal cues;Handout   Comprehension (p) Verbalized understanding;Returned demonstration          OT Short Term Goals - 08/15/14 1341    OT SHORT TERM GOAL #1    Title Patient will be educated on a HEP.   Time 1   Period Days   Status Achieved                  Plan - 08/15/14 1338    Clinical Impression Statement A:  Patient referred to outpatient occupational therapy for evaluation and treatment of left shoulder pain.  Patient assessed and found to have WFL AROM and strength in her left shoulder region with no pain and trace fascial restrictions.  Patient was educated on a HEP for scapular stability and shoulder strengthening with red theraband.  Patient discharged from skilled OT intervention this date, as no skilled need for therapy is indicated at this time.    OT Frequency 1x / week   OT Duration --  1 week   OT Treatment/Interventions Patient/family education   Plan DC from skilled OT intervention this date with a HEP for shoulder strengthening and scapular stability.         Problem List Patient Active Problem List   Diagnosis Date Noted  . Vulvar cancer 11/13/2011  . 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 History of CVA (cerebrovascular accident) 11/13/2011  . Post-menopausal bleeding 11/13/2011  . HIV INFECTION 09/08/2006  . CANDIDIASIS, VAGINAL 09/08/2006  . 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 PERIPHERAL NEUROPATHY 09/08/2006  . HYPERTENSION 09/08/2006  . REFLUX ESOPHAGITIS 09/08/2006  . OVARIAN CYST 09/08/2006  . DYSPLASIA, CERVIX NOS 09/08/2006  . FOLLICULITIS 03/83/3383  . LYMPHADENOPATHY 09/08/2006  . PAP SMEAR, ABNORMAL 10/09/2000    Vangie Bicker, OTR/L [phone]  08/15/2014, 1:46 PM  [facility] [address] [place], [place], [address] Phone: [phone]   Fax:  [phone]

## 2014-08-15 NOTE — Addendum Note (Signed)
Encounter addended by: Arbutus Ped, OTR on: 08/15/2014  1:47 PM<BR>     Documentation filed: Episodes

## 2014-11-15 ENCOUNTER — Ambulatory Visit: Payer: Medicaid Other | Admitting: Obstetrics & Gynecology

## 2014-12-13 ENCOUNTER — Ambulatory Visit (INDEPENDENT_AMBULATORY_CARE_PROVIDER_SITE_OTHER): Payer: Medicaid Other | Admitting: Obstetrics & Gynecology

## 2014-12-13 ENCOUNTER — Encounter: Payer: Self-pay | Admitting: Obstetrics & Gynecology

## 2014-12-13 ENCOUNTER — Other Ambulatory Visit (HOSPITAL_COMMUNITY)
Admission: RE | Admit: 2014-12-13 | Discharge: 2014-12-13 | Disposition: A | Discharge status: Home / Self Care | Payer: Medicaid Other | Source: Ambulatory Visit | Attending: Obstetrics & Gynecology | Admitting: Obstetrics & Gynecology

## 2014-12-13 VITALS — BP 139/72 | HR 65 | Temp 97.8°F | Ht 65.0 in | Wt 250.9 lb

## 2014-12-13 DIAGNOSIS — Z1151 Encounter for screening for human papillomavirus (HPV): Secondary | ICD-10-CM | POA: Insufficient documentation

## 2014-12-13 DIAGNOSIS — Z124 Encounter for screening for malignant neoplasm of cervix: Secondary | ICD-10-CM

## 2014-12-13 DIAGNOSIS — Z01419 Encounter for gynecological examination (general) (routine) without abnormal findings: Secondary | ICD-10-CM | POA: Insufficient documentation

## 2014-12-13 DIAGNOSIS — Z0000 Encounter for general adult medical examination without abnormal findings: Secondary | ICD-10-CM

## 2014-12-13 NOTE — Progress Notes (Signed)
Subjective:    Laura Jimenez is a 64 y.o. W AA  female who presents for an annual exam. The patient has no complaints today. The patient is not currently sexually active. GYN screening history: last pap: was normal. The patient wears seatbelts: yes. The patient participates in regular exercise: no. Has the patient ever been transfused or tattooed?: no. The patient reports that there is not domestic violence in her life.   Menstrual History: OB History    Gravida Para Term Preterm AB TAB SAB Ectopic Multiple Living   3 2 2  1  1          Menarche age: 89  No LMP recorded. Patient is postmenopausal.    The following portions of the patient's history were reviewed and updated as appropriate: allergies, current medications, past family history, past medical history, past social history, past surgical history and problem list.  Review of Systems A comprehensive review of systems was negative.    Objective:    BP 139/72 mmHg  Pulse 65  Temp(Src) 97.8 F (36.6 C)  Ht 5\' 5"  (1.651 m)  Wt 250 lb 14.4 oz (113.807 kg)  BMI 41.75 kg/m2  General Appearance:    Alert, cooperative, no distress, appears stated age  Head:    Normocephalic, without obvious abnormality, atraumatic  Eyes:    PERRL, conjunctiva/corneas clear, EOM's intact, fundi    benign, both eyes  Ears:    Normal TM's and external ear canals, both ears  Nose:   Nares normal, septum midline, mucosa normal, no drainage    or sinus tenderness  Throat:   Lips, mucosa, and tongue normal; teeth and gums normal  Neck:   Supple, symmetrical, trachea midline, no adenopathy;    thyroid:  no enlargement/tenderness/nodules; no carotid   bruit or JVD  Back:     Symmetric, no curvature, ROM normal, no CVA tenderness  Lungs:     Clear to auscultation bilaterally, respirations unlabored  Chest Wall:    No tenderness or deformity   Heart:    Regular rate and rhythm, S1 and S2 normal, no murmur, rub   or gallop  Breast Exam:    No  tenderness, masses, or nipple abnormality  Abdomen:     Soft, non-tender, bowel sounds active all four quadrants,    no masses, no organomegaly, obese  Genitalia:    Normal female without lesion, discharge or tenderness, NSSA, NT, no adnexal masses palpable     Extremities:   Extremities normal, atraumatic, no cyanosis or edema  Pulses:   2+ and symmetric all extremities  Skin:   Skin color, texture, turgor normal, no rashes or lesions  Lymph nodes:   Cervical, supraclavicular, and axillary nodes normal  Neurologic:   CNII-XII intact, normal strength, sensation and reflexes    throughout  .    Assessment:    Healthy female exam.    Plan:     Mammogram. Thin prep Pap smear. with cotesting

## 2014-12-15 LAB — CYTOLOGY - PAP

## 2014-12-20 ENCOUNTER — Ambulatory Visit (HOSPITAL_COMMUNITY)
Admission: RE | Admit: 2014-12-20 | Discharge: 2014-12-20 | Disposition: A | Discharge status: Home / Self Care | Payer: Medicaid Other | Source: Ambulatory Visit | Attending: Obstetrics & Gynecology | Admitting: Obstetrics & Gynecology

## 2014-12-20 ENCOUNTER — Telehealth: Payer: Self-pay | Admitting: *Deleted

## 2014-12-20 DIAGNOSIS — Z1231 Encounter for screening mammogram for malignant neoplasm of breast: Secondary | ICD-10-CM | POA: Insufficient documentation

## 2014-12-20 DIAGNOSIS — Z0000 Encounter for general adult medical examination without abnormal findings: Secondary | ICD-10-CM

## 2014-12-20 NOTE — Telephone Encounter (Signed)
Pap showed yeast.  Attempted to contact patient to give results and medicine treatment.

## 2015-01-30 ENCOUNTER — Telehealth: Payer: Self-pay | Admitting: General Practice

## 2015-01-30 NOTE — Telephone Encounter (Signed)
Patient called and left message requesting results. States someone had called her but she missed the call. States if she doesn't answer we can leave a message on her voicemail or send a letter in the mail. Called patient and informed her of normal pap. Patient verbalized understanding and requested copy of results. Told patient she will need to come by the office to sign a release form and then we will be able to give those to her. Patient verbalized understanding and had no other questions

## 2016-04-25 IMAGING — MR MR SHOULDER*L* W/O CM
4 of 5 series · 19 of 40 positions shown · non-contrast
Comparison: None.

CLINICAL DATA: LEFT shoulder pain for over 1 year. No injury.
Rotator cuff tear.

EXAM:
MRI OF THE LEFT SHOULDER WITHOUT CONTRAST
TECHNIQUE: Multiplanar, multisequence MR imaging of the shoulder was performed.
No intravenous contrast was administered.

[Series 6: T2 fat-sat · axial · 4.0mm · 0.27mm/px · z∈[-68,-20]mm · 5 of 16 slices shown]
[im 1/16]
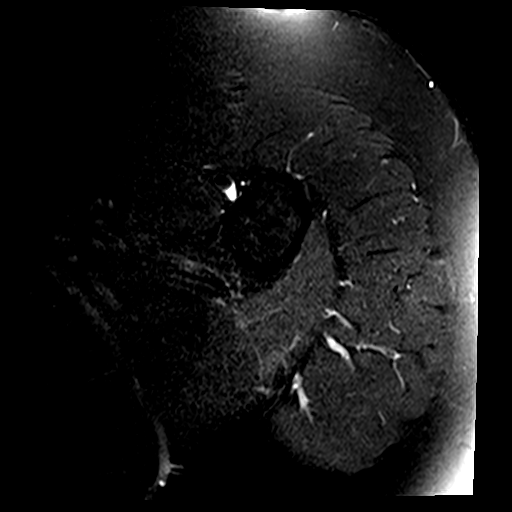
[im 3/16]
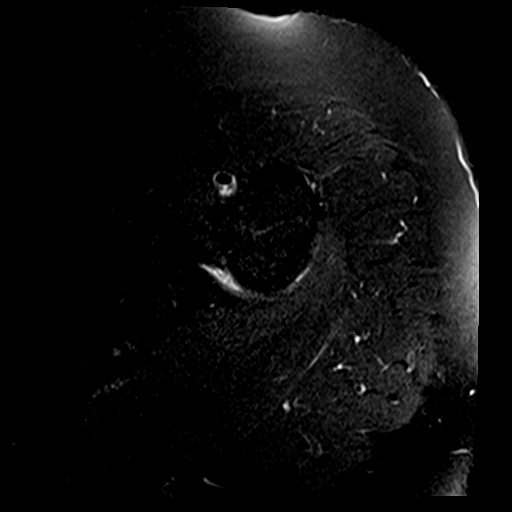
[im 5/16]
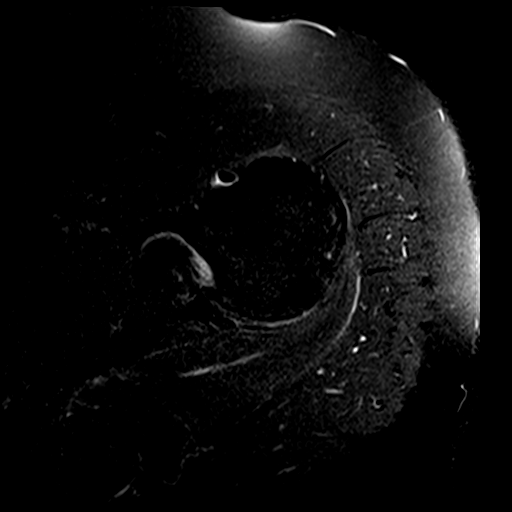
[im 9/16]
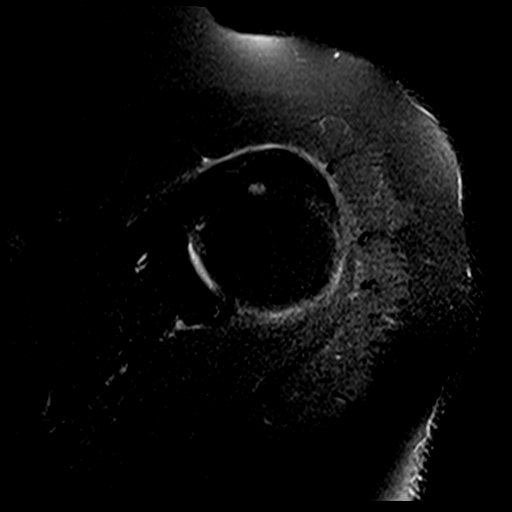
[im 13/16]
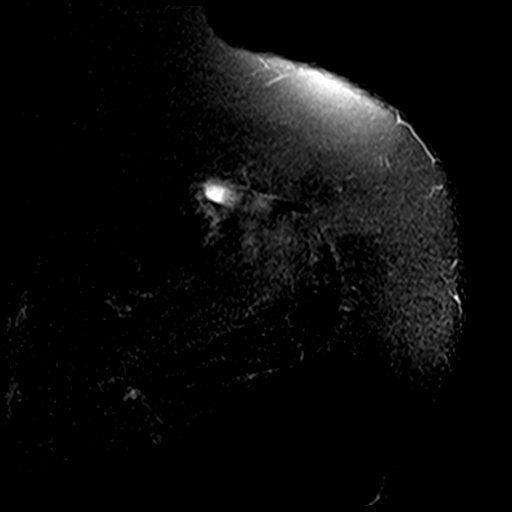

[Series 7: T2 · oblique · 4.0mm · 0.31mm/px · 3 of 16 slices shown]
[im 3/16]
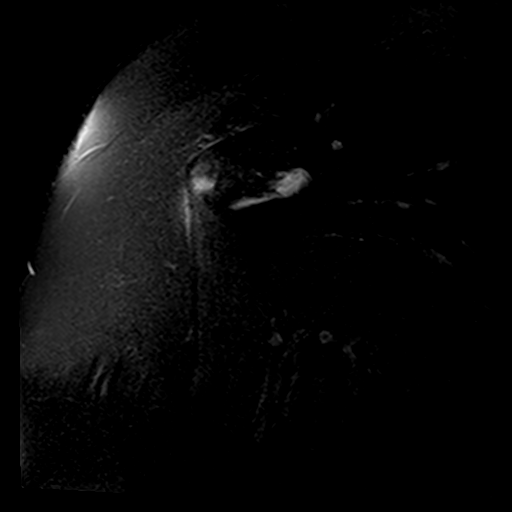
[im 9/16]
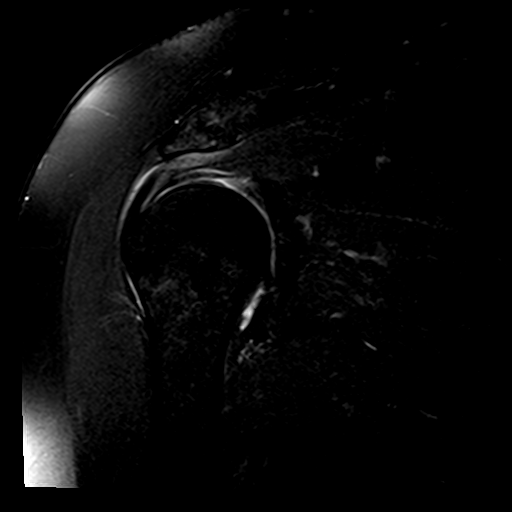
[im 13/16]
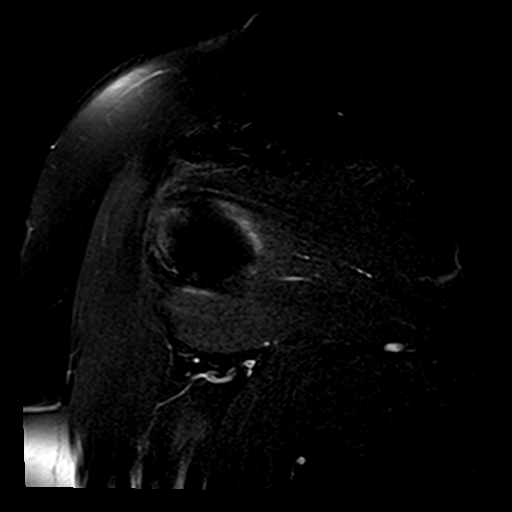

[Series 8: PD · oblique · 4.0mm · 0.31mm/px · 8 of 16 slices shown]
[im 1/16]
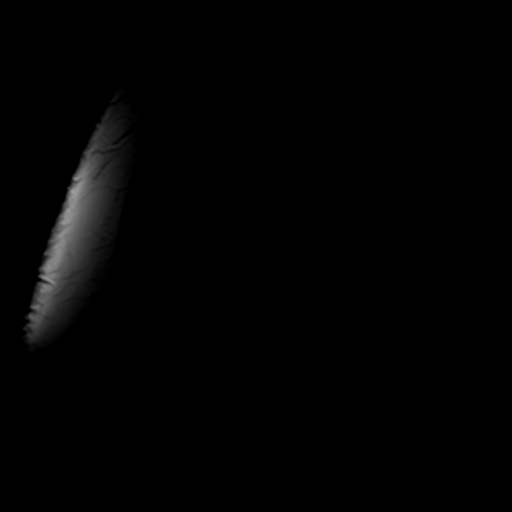
[im 3/16]
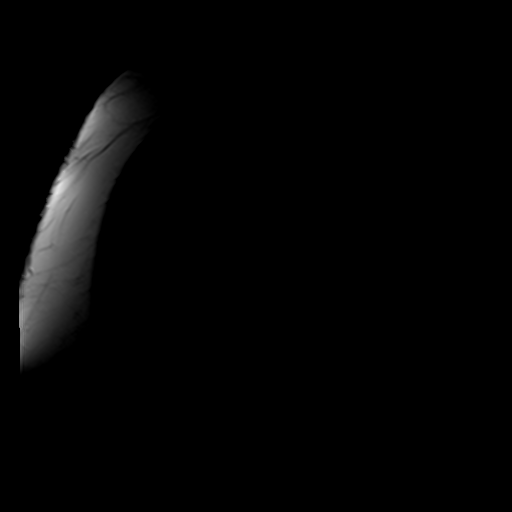
[im 5/16]
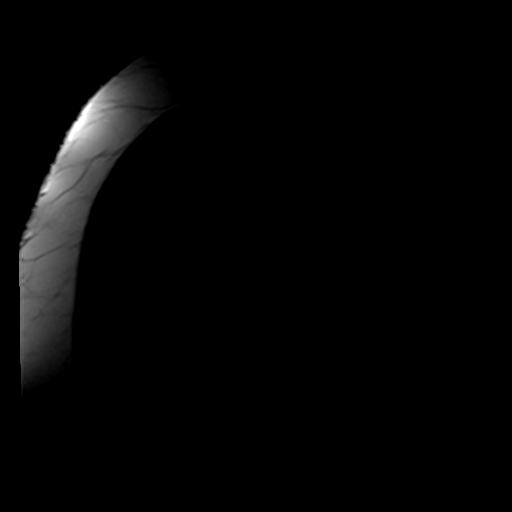
[im 7/16]
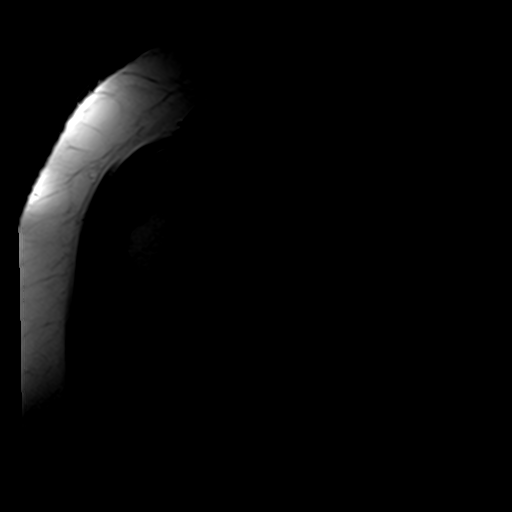
[im 9/16]
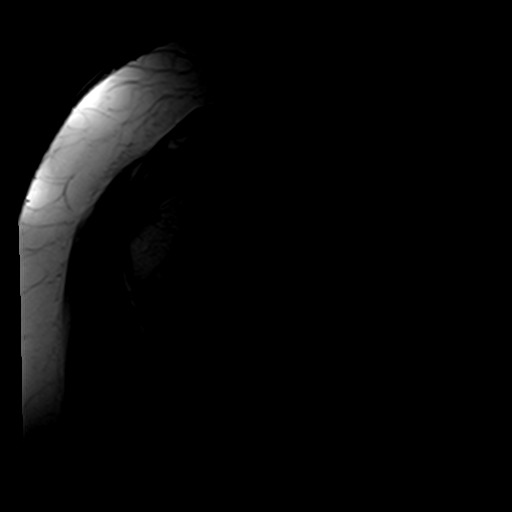
[im 11/16]
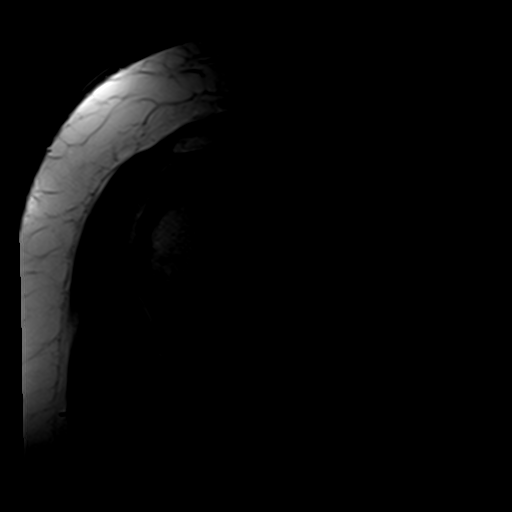
[im 13/16]
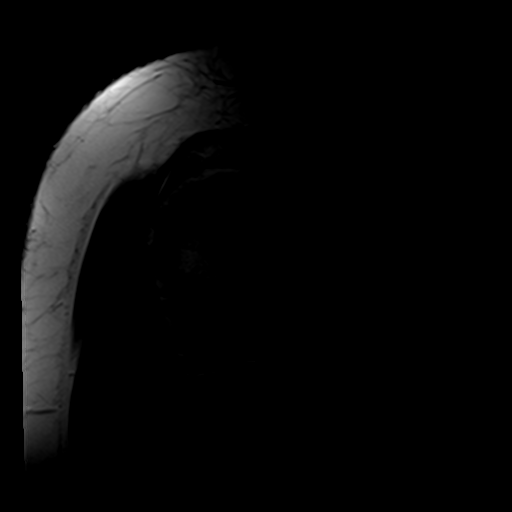
[im 16/16]
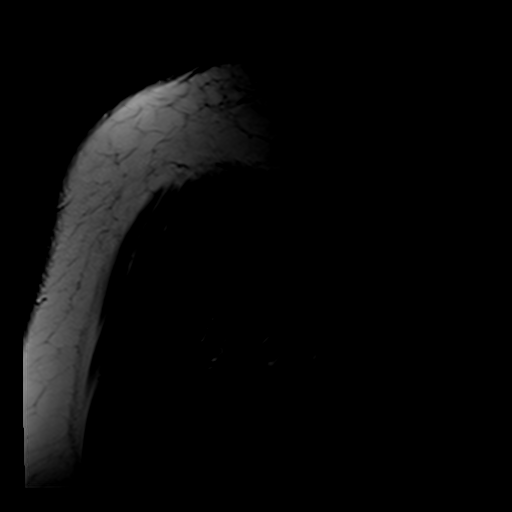

[Series 9: T1 · oblique · 4.0mm · 0.31mm/px · 3 of 16 slices shown]
[im 3/16]
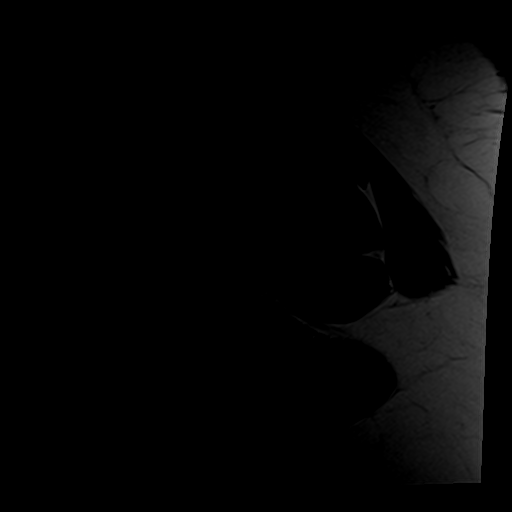
[im 9/16]
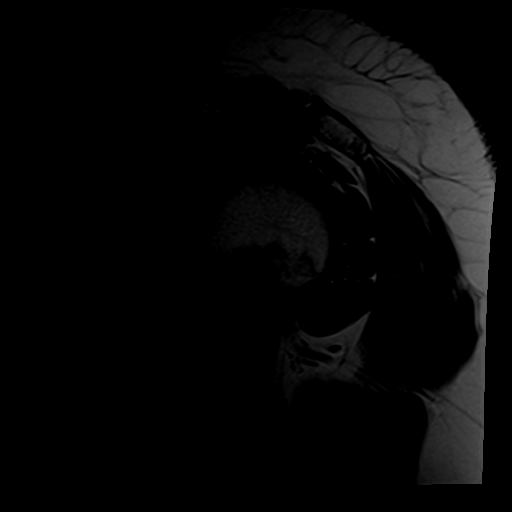
[im 13/16]
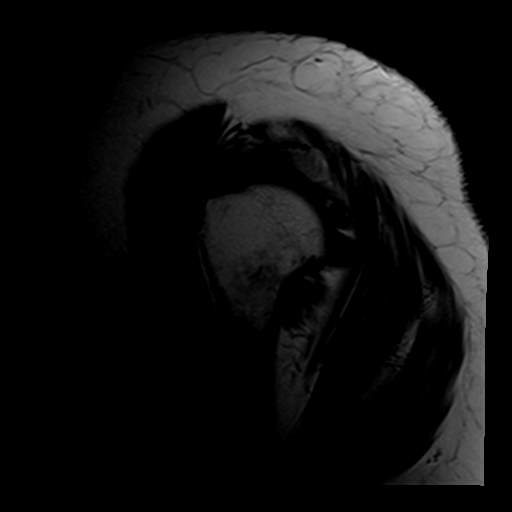

[19 of 40 positions shown; findings below may reference images not displayed]

FINDINGS: Rotator cuff: Severe SUPRASPINATUS tendinopathy is present. There is
intrasubstance tearing of the tendon at the anterior insertion. The
tearing extends to the bursal surface of the tendon. There is no
full-thickness tear or retraction. The INFRASPINATUS tendon is
intact. Teres minor tendon intact. SUBSCAPULARIS tendon is within
normal limits.

Muscles:  No atrophy or edema.

Biceps long head:  Normal.

Acromioclavicular Joint: Type 2 acromion. Mild AC joint
osteoarthritis. Subacromial bursitis.

Glenohumeral Joint: Severe glenohumeral osteoarthritis is present.
Subchondral cysts in the glenoid. Marginal osteophytes off the
humeral head. Small glenohumeral effusion and synovitis of the
axillary pouch.

On sagittal imaging, there is fatty signal in the subcoracoid
region, which appears to represent fat rather than a loose body.

Labrum:   Diffuse degenerative labral tearing.  No paralabral cysts.

Bones:  No aggressive osseous lesions.
IMPRESSION: 1. Severe glenohumeral osteoarthritis with denuded glenoid
cartilage.
2. Subacromial bursitis.
3. Severe SUPRASPINATUS tendinopathy with intrasubstance tearing at
the insertion. Small fissure extending to the bursal surface. No
full-thickness tear or retraction.

## 2017-08-11 ENCOUNTER — Other Ambulatory Visit: Payer: Self-pay

## 2017-08-11 ENCOUNTER — Encounter: Payer: Self-pay | Admitting: Family Medicine

## 2017-08-11 ENCOUNTER — Ambulatory Visit (INDEPENDENT_AMBULATORY_CARE_PROVIDER_SITE_OTHER): Payer: Medicaid Other | Admitting: Family Medicine

## 2017-08-11 VITALS — BP 190/90 | HR 62 | Temp 98.9°F | Resp 16 | Ht 65.0 in | Wt 248.2 lb

## 2017-08-11 DIAGNOSIS — I639 Cerebral infarction, unspecified: Secondary | ICD-10-CM | POA: Diagnosis not present

## 2017-08-11 DIAGNOSIS — Z79899 Other long term (current) drug therapy: Secondary | ICD-10-CM | POA: Diagnosis not present

## 2017-08-11 DIAGNOSIS — R5383 Other fatigue: Secondary | ICD-10-CM

## 2017-08-11 DIAGNOSIS — Z1231 Encounter for screening mammogram for malignant neoplasm of breast: Secondary | ICD-10-CM

## 2017-08-11 DIAGNOSIS — Z1239 Encounter for other screening for malignant neoplasm of breast: Secondary | ICD-10-CM

## 2017-08-11 DIAGNOSIS — E782 Mixed hyperlipidemia: Secondary | ICD-10-CM

## 2017-08-11 DIAGNOSIS — R739 Hyperglycemia, unspecified: Secondary | ICD-10-CM

## 2017-08-11 DIAGNOSIS — I10 Essential (primary) hypertension: Secondary | ICD-10-CM

## 2017-08-11 MED ORDER — LISINOPRIL-HYDROCHLOROTHIAZIDE 20-25 MG PO TABS: 1.0000 | ORAL_TABLET | Freq: Every day | ORAL | 3 refills | Status: DC

## 2017-08-11 NOTE — Patient Instructions (Signed)
DASH Eating Plan DASH stands for "Dietary Approaches to Stop Hypertension." The DASH eating plan is a healthy eating plan that has been shown to reduce high blood pressure (hypertension). It may also reduce your risk for type 2 diabetes, heart disease, and stroke. The DASH eating plan may also help with weight loss. What are tips for following this plan? General guidelines  Avoid eating more than 2,300 mg (milligrams) of salt (sodium) a day. If you have hypertension, you may need to reduce your sodium intake to 1,500 mg a day.  Limit alcohol intake to no more than 1 drink a day for nonpregnant women and 2 drinks a day for men. One drink equals 12 oz of beer, 5 oz of wine, or 1 oz of hard liquor.  Work with your health care provider to maintain a healthy body weight or to lose weight. Ask what an ideal weight is for you.  Get at least 30 minutes of exercise that causes your heart to beat faster (aerobic exercise) most days of the week. Activities may include walking, swimming, or biking.  Work with your health care provider or diet and nutrition specialist (dietitian) to adjust your eating plan to your individual calorie needs. Reading food labels  Check food labels for the amount of sodium per serving. Choose foods with less than 5 percent of the Daily Value of sodium. Generally, foods with less than 300 mg of sodium per serving fit into this eating plan.  To find whole grains, look for the word "whole" as the first word in the ingredient list. Shopping  Buy products labeled as "low-sodium" or "no salt added."  Buy fresh foods. Avoid canned foods and premade or frozen meals. Cooking  Avoid adding salt when cooking. Use salt-free seasonings or herbs instead of table salt or sea salt. Check with your health care provider or pharmacist before using salt substitutes.  Do not fry foods. Cook foods using healthy methods such as baking, boiling, grilling, and broiling instead.  Cook with  heart-healthy oils, such as olive, canola, soybean, or sunflower oil. Meal planning   Eat a balanced diet that includes: ? 5 or more servings of fruits and vegetables each day. At each meal, try to fill half of your plate with fruits and vegetables. ? Up to 6-8 servings of whole grains each day. ? Less than 6 oz of lean meat, poultry, or fish each day. A 3-oz serving of meat is about the same size as a deck of cards. One egg equals 1 oz. ? 2 servings of low-fat dairy each day. ? A serving of nuts, seeds, or beans 5 times each week. ? Heart-healthy fats. Healthy fats called Omega-3 fatty acids are found in foods such as flaxseeds and coldwater fish, like sardines, salmon, and mackerel.  Limit how much you eat of the following: ? Canned or prepackaged foods. ? Food that is high in trans fat, such as fried foods. ? Food that is high in saturated fat, such as fatty meat. ? Sweets, desserts, sugary drinks, and other foods with added sugar. ? Full-fat dairy products.  Do not salt foods before eating.  Try to eat at least 2 vegetarian meals each week.  Eat more home-cooked food and less restaurant, buffet, and fast food.  When eating at a restaurant, ask that your food be prepared with less salt or no salt, if possible. What foods are recommended? The items listed may not be a complete list. Talk with your dietitian about what   dietary choices are best for you. Grains Whole-grain or whole-wheat bread. Whole-grain or whole-wheat pasta. Brown rice. Oatmeal. Quinoa. Bulgur. Whole-grain and low-sodium cereals. Pita bread. Low-fat, low-sodium crackers. Whole-wheat flour tortillas. Vegetables Fresh or frozen vegetables (raw, steamed, roasted, or grilled). Low-sodium or reduced-sodium tomato and vegetable juice. Low-sodium or reduced-sodium tomato sauce and tomato paste. Low-sodium or reduced-sodium canned vegetables. Fruits All fresh, dried, or frozen fruit. Canned fruit in natural juice (without  added sugar). Meat and other protein foods Skinless chicken or turkey. Ground chicken or turkey. Pork with fat trimmed off. Fish and seafood. Egg whites. Dried beans, peas, or lentils. Unsalted nuts, nut butters, and seeds. Unsalted canned beans. Lean cuts of beef with fat trimmed off. Low-sodium, lean deli meat. Dairy Low-fat (1%) or fat-free (skim) milk. Fat-free, low-fat, or reduced-fat cheeses. Nonfat, low-sodium ricotta or cottage cheese. Low-fat or nonfat yogurt. Low-fat, low-sodium cheese. Fats and oils Soft margarine without trans fats. Vegetable oil. Low-fat, reduced-fat, or light mayonnaise and salad dressings (reduced-sodium). Canola, safflower, olive, soybean, and sunflower oils. Avocado. Seasoning and other foods Herbs. Spices. Seasoning mixes without salt. Unsalted popcorn and pretzels. Fat-free sweets. What foods are not recommended? The items listed may not be a complete list. Talk with your dietitian about what dietary choices are best for you. Grains Baked goods made with fat, such as croissants, muffins, or some breads. Dry pasta or rice meal packs. Vegetables Creamed or fried vegetables. Vegetables in a cheese sauce. Regular canned vegetables (not low-sodium or reduced-sodium). Regular canned tomato sauce and paste (not low-sodium or reduced-sodium). Regular tomato and vegetable juice (not low-sodium or reduced-sodium). Pickles. Olives. Fruits Canned fruit in a light or heavy syrup. Fried fruit. Fruit in cream or butter sauce. Meat and other protein foods Fatty cuts of meat. Ribs. Fried meat. Bacon. Sausage. Bologna and other processed lunch meats. Salami. Fatback. Hotdogs. Bratwurst. Salted nuts and seeds. Canned beans with added salt. Canned or smoked fish. Whole eggs or egg yolks. Chicken or turkey with skin. Dairy Whole or 2% milk, cream, and half-and-half. Whole or full-fat cream cheese. Whole-fat or sweetened yogurt. Full-fat cheese. Nondairy creamers. Whipped toppings.  Processed cheese and cheese spreads. Fats and oils Butter. Stick margarine. Lard. Shortening. Ghee. Bacon fat. Tropical oils, such as coconut, palm kernel, or palm oil. Seasoning and other foods Salted popcorn and pretzels. Onion salt, garlic salt, seasoned salt, table salt, and sea salt. Worcestershire sauce. Tartar sauce. Barbecue sauce. Teriyaki sauce. Soy sauce, including reduced-sodium. Steak sauce. Canned and packaged gravies. Fish sauce. Oyster sauce. Cocktail sauce. Horseradish that you find on the shelf. Ketchup. Mustard. Meat flavorings and tenderizers. Bouillon cubes. Hot sauce and Tabasco sauce. Premade or packaged marinades. Premade or packaged taco seasonings. Relishes. Regular salad dressings. Where to find more information:  National Heart, Lung, and Blood Institute: www.nhlbi.nih.gov  American Heart Association: www.heart.org Summary  The DASH eating plan is a healthy eating plan that has been shown to reduce high blood pressure (hypertension). It may also reduce your risk for type 2 diabetes, heart disease, and stroke.  With the DASH eating plan, you should limit salt (sodium) intake to 2,300 mg a day. If you have hypertension, you may need to reduce your sodium intake to 1,500 mg a day.  When on the DASH eating plan, aim to eat more fresh fruits and vegetables, whole grains, lean proteins, low-fat dairy, and heart-healthy fats.  Work with your health care provider or diet and nutrition specialist (dietitian) to adjust your eating plan to your individual   calorie needs. This information is not intended to replace advice given to you by your health care provider. Make sure you discuss any questions you have with your health care provider. Document Released: 07/31/2011 Document Revised: 08/04/2016 Document Reviewed: 08/04/2016 [employer] Interactive Patient Education  2017 [employer]  

## 2017-08-11 NOTE — Progress Notes (Signed)
Patient ID: Laura Jimenez, female    DOB: 04/23/1951, 66 y.o.   MRN: [id number]  Chief Complaint  Patient presents with  . Hypertension    Allergies Ivp dye [iodinated diagnostic agents]; Tape; Cephalexin; and Latex  Subjective:   Laura Jimenez is a 66 y.o. female who presents to [facility] today.  HPI Mrs. Laura Jimenez is here to establish care as a new patient and to discuss her BP.  She is followed at [facility] by Laura Jimenez in [place] for HIV and followed by GI due to GERD/esophageal stricture. Was previously seen by Dr. Theda Jimenez for primary care needs. Is followed at [facility] in [place] for gynecologic care/pap smears.   Reports that she has had a history of hypertension since she was in her 57s.  Reports that she has been on ACE-I and clonidine in the past.  After discussion she has also been on amlodipine and HCTZ in the past.  Does not like the clonidine b/c it makes her to sleepy to drive.  She reports that she has not always been compliant with her medications and is trying to stretch them because she would not want to run out.  Reports she does not take any blood pressure medication in over 2 weeks.  When she would take the clonidine she reports that she would use it just a few times a week.  Reports that she has had some high blood sugars in the past but is never been checked for diabetes.  Her sister is diabetic.  She reports she ran out of her cholesterol medication months ago.  Reports that she would like to know what her cholesterol values are and if she really needs it before she starts back on the medication.  She believes she has had her pneumonia shot and vaccinations by her doctors at [facility]  She reports that her mood is good.  Has recently dealt with the death of her brother and reports that she was a little sad dealing with his death.  She reports she spent a lot of time with him while he was in the nursing  home.  She is eating well.  Reports she sleeps well.  Has friends and good social support.  Enjoys going to church and singing gospel music.  Reports she eats a healthy diet and that her weight has been stable.   Hypertension  This is a chronic (reports that she was diagnosed in her 33s with HTN) problem. The current episode started more than 1 year ago. The problem has been waxing and waning since onset. The problem is uncontrolled. Pertinent negatives include no chest pain, headaches, palpitations or shortness of breath. There are no associated agents to hypertension. Risk factors for coronary artery disease include dyslipidemia, family history, obesity, post-menopausal state, stress and sedentary lifestyle. Past treatments include ACE inhibitors, diuretics and central alpha agonists. The current treatment provides mild improvement. Compliance problems include diet, exercise and medication side effects (reports tthat tries to "stretch" her medication b/c did not have a doctor).  Hypertensive end-organ damage includes CVA. There is no history of angina, kidney disease, CAD/MI, left ventricular hypertrophy, PVD or retinopathy.    Past Medical History:  Diagnosis Date  . Cancer (Laura Jimenez)   . CVA (cerebral infarction)   . HIV infection (Laura Jimenez)   . HTN (hypertension)   . Hyperlipidemia   . Morbid obesity with BMI of 40.0-44.9, adult (Laura Jimenez)   . Postmenopausal bleeding   .  Stroke Laura Jimenez)    no residual paralysis    Past Surgical History:  Procedure Laterality Date  . ABDOMINAL HYSTERECTOMY    . cervical cancer    . COLONOSCOPY  2000  . HYSTEROSCOPY W/D&C N/A 04/19/2013   Procedure: DILATATION AND CURETTAGE /HYSTEROSCOPY;  Surgeon: Emily Filbert, MD;  Location: [facility];  Service: Gynecology;  Laterality: N/A;    Family History  Problem Relation Age of Onset  . Hypertension Mother   . Hypertension Sister   . Arthritis Sister   . Cancer Other   . Diabetes Other      Social History   Socioeconomic  History  . Marital status: Widowed    Spouse name: None  . Number of children: None  . Years of education: None  . Highest education level: None  Social Needs  . Financial resource strain: None  . Food insecurity - worry: None  . Food insecurity - inability: None  . Transportation needs - medical: None  . Transportation needs - non-medical: None  Occupational History  . None  Tobacco Use  . Smoking status: Former Smoker    Packs/day: 1.00    Years: 5.00    Pack years: 5.00    Types: Cigarettes  . Smokeless tobacco: Never Used  Substance and Sexual Activity  . Alcohol use: No  . Drug use: No  . Sexual activity: Not Currently    Birth control/protection: None, Post-menopausal  Other Topics Concern  . None  Social History Narrative   Grew up in [place], [place].   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   2 children, boy and girl. Son is 66s, and special needs.   Daughter in her 89s, is a Marine scientist.   Eats all food groups.   Wears seatbelt.   Attends church.   Enjoys watching tv, listening to [employer], and spending time with friends.   Enjoys going out to eat.    Current Outpatient Medications on File Prior to Visit  Medication Sig Dispense Refill  . Bioflavonoid Products (VITAMIN C) CHEW Chew 1 tablet by mouth daily.    . cloNIDine (CATAPRES) 0.1 MG tablet Take 0.1 mg by mouth 2 (two) times daily.    . darunavir (PREZISTA) 600 MG tablet Take 600 mg by mouth 2 (two) times daily with a meal.    . emtricitabine (EMTRIVA) 200 MG capsule Take 200 mg by mouth daily.    . Pediatric Multiple Vitamins (CHEWABLE MULTIPLE VITAMINS PO) Take 1 tablet by mouth daily.    . ritonavir (NORVIR) 100 MG TABS Take by mouth 2 (two) times daily with a meal.    . raltegravir (ISENTRESS) 400 MG tablet Take 400 mg by mouth 2 (two) times daily.     No current facility-administered medications on file prior to visit.     Review of Systems  Constitutional: Negative for appetite change,  chills, diaphoresis, fever and unexpected weight change.  HENT: Negative for nosebleeds, trouble swallowing and voice change.   Eyes: Negative for visual disturbance.  Respiratory: Negative for cough, chest tightness, shortness of breath and wheezing.   Cardiovascular: Negative for chest pain, palpitations and leg swelling.  Gastrointestinal: Negative for abdominal pain, blood in stool, constipation, diarrhea, nausea and vomiting.  Genitourinary: Negative for difficulty urinating, dysuria, frequency, hematuria and urgency.  Musculoskeletal: Negative for neck stiffness.  Skin: Negative for rash.  Neurological: Negative for dizziness, tremors, syncope, facial asymmetry, speech difficulty, weakness, numbness and headaches.  Hematological: Negative for adenopathy.  Does not bruise/bleed easily.  Psychiatric/Behavioral: Negative for decreased concentration and sleep disturbance. The patient is not nervous/anxious.      Objective:   BP (!) 190/90 (BP Location: Left Arm, Patient Position: Sitting, Cuff Size: Normal)   Pulse 62   Temp 98.9 F (37.2 C) (Other (Comment))   Resp 16   Ht 5\' 5"  (1.651 m)   Wt 248 lb 4 oz (112.6 kg)   SpO2 99%   BMI 41.31 kg/m   Physical Exam  Constitutional: She is oriented to person, place, and time. She appears well-developed and well-nourished.  HENT:  Head: Normocephalic and atraumatic.  Mouth/Throat: No oropharyngeal exudate.  Eyes: Conjunctivae and EOM are normal. Pupils are equal, round, and reactive to light. No scleral icterus.  Neck: Normal range of motion. Neck supple. No JVD present. No tracheal deviation present. No thyromegaly present.  Cardiovascular: Normal rate, regular rhythm and normal heart sounds.  Pulmonary/Chest: Effort normal and breath sounds normal.  Abdominal: Soft. Bowel sounds are normal.  Musculoskeletal: Normal range of motion.  Lymphadenopathy:    She has no cervical adenopathy.  Neurological: She is alert and oriented to  person, place, and time. No cranial nerve deficit.  Skin: Skin is warm and dry. Capillary refill takes less than 2 seconds.  Psychiatric: She has a normal mood and affect. Her behavior is normal. Judgment and thought content normal.     Assessment and Plan  1. Essential hypertension -Diet was discussed with patient today.  She is not currently on aspirin but will not initiate today due to the fact that her blood pressure is substantially elevated.  We discussed today that her blood pressure puts her at increased risk of another stroke or heart attack.  She voiced understanding of this and she is asymptomatic at this time.  We will start lisinopril HCTZ and she will follow-up in 10 days to 2 weeks for recheck.   - lisinopril-hydrochlorothiazide (PRINZIDE,ZESTORETIC) 20-25 MG tablet; Take 1 tablet by mouth daily.  Dispense: 90 tablet; Refill: 3 Lifestyle modifications discussed with patient including a diet emphasizing vegetables, fruits, and whole grains. Limiting intake of sodium to less than 2,400 mg per day.  Recommendations discussed include consuming low-fat dairy products, poultry, fish, legumes, non-tropical vegetable oils, and nuts; and limiting intake of sweets, sugar-sweetened beverages, and red meat. Discussed following a plan such as the Dietary Approaches to Stop Hypertension (DASH) diet. Patient to read up on this diet.   2. Hyperlipidemia, mixed Patient was previously on atorvastatin 40 mg 1 p.o. nightly.  She would like to see what her cholesterol values are before initiating this therapy.  We will check today.  In addition check baseline LFTs. - Lipid panel  3. Cerebrovascular accident (CVA), unspecified mechanism ([place]) Prior CVA reported by patient, although she is not on any antiplatelet agents.  I am uncertain as to whether she was initially on aspirin and it was stopped because of some GI issues and reflux.  I will review her records and we will discuss this at her follow-up.   Risk factor modification and screening for other comorbid diseases that can increase her wrists were discussed today. Patient was counseled concerning worrisome signs and symptoms of stroke.  She was told that if any of these symptoms occurred to please call 911 and seek medical help.  She voiced understanding. 4. High risk medication use Check labs. - Basic metabolic panel - Vitamin G29 - Hepatic function panel  5. Hyperglycemia Patient reported history of hyperglycemia  and strong family history of diabetes and patient is morbidly obese.  Check A1c. - Hemoglobin A1c  6. Fatigue, unspecified type Patient reports that she has been tired for many years.  She denies ever being screened for hypothyroidism.  Check today - TSH  7. Screening for breast cancer Clinical breast exam at follow-up.  Self breast exam recommended to patient. - MM SCREENING BREAST TOMO BILATERAL; Future Patient reports that she will schedule her Pap and pelvic exam at [facility] in [place]. 8.  HIV/immunodeficiency syndrome Keep scheduled appointments with Laura Jimenez at [facility].  Compliance with her medication was discussed today.  Will request her immunization records from Dr. Frederico Hamman office.  Return in about 2 weeks (around 08/25/2017) for follow up. Caren Macadam, MD 08/11/2017

## 2017-08-12 LAB — LIPID PANEL
CHOLESTEROL: 201 mg/dL — AB (ref <200)
HDL: 47 mg/dL — AB (ref >50)
LDL Cholesterol (Calc): 132 mg/dL (calc) — ABNORMAL HIGH
NON-HDL CHOLESTEROL (CALC): 154 mg/dL — AB (ref <130)
Total CHOL/HDL Ratio: 4.3 (calc) (ref <5.0)
Triglycerides: 109 mg/dL (ref <150)

## 2017-08-12 LAB — BASIC METABOLIC PANEL
BUN: 12 mg/dL (ref 7–25)
CHLORIDE: 103 mmol/L (ref 98–110)
CO2: 29 mmol/L (ref 20–32)
Calcium: 9 mg/dL (ref 8.6–10.4)
Creat: 0.83 mg/dL (ref 0.50–0.99)
Glucose, Bld: 104 mg/dL — ABNORMAL HIGH (ref 65–99)
Potassium: 3.9 mmol/L (ref 3.5–5.3)
Sodium: 137 mmol/L (ref 135–146)

## 2017-08-12 LAB — HEMOGLOBIN A1C
Hgb A1c MFr Bld: 5.6 % of total Hgb (ref <5.7)
Mean Plasma Glucose: 114 (calc)
eAG (mmol/L): 6.3 (calc)

## 2017-08-12 LAB — HEPATIC FUNCTION PANEL
AG Ratio: 0.7 (calc) — ABNORMAL LOW (ref 1.0–2.5)
ALBUMIN MSPROF: 3.4 g/dL — AB (ref 3.6–5.1)
ALKALINE PHOSPHATASE (APISO): 76 U/L (ref 33–130)
ALT: 12 U/L (ref 6–29)
AST: 18 U/L (ref 10–35)
BILIRUBIN INDIRECT: 0.5 mg/dL (ref 0.2–1.2)
BILIRUBIN TOTAL: 0.6 mg/dL (ref 0.2–1.2)
Bilirubin, Direct: 0.1 mg/dL (ref 0.0–0.2)
Globulin: 5 g/dL (calc) — ABNORMAL HIGH (ref 1.9–3.7)
Total Protein: 8.4 g/dL — ABNORMAL HIGH (ref 6.1–8.1)

## 2017-08-12 LAB — TSH: TSH: 6.88 mIU/L — ABNORMAL HIGH (ref 0.40–4.50)

## 2017-08-12 LAB — VITAMIN B12: Vitamin B-12: 459 pg/mL (ref 200–1100)

## 2017-08-13 ENCOUNTER — Other Ambulatory Visit: Payer: Self-pay | Admitting: Family Medicine

## 2017-08-13 DIAGNOSIS — Z1231 Encounter for screening mammogram for malignant neoplasm of breast: Secondary | ICD-10-CM

## 2017-08-20 ENCOUNTER — Encounter: Payer: Self-pay | Admitting: Family Medicine

## 2017-08-20 ENCOUNTER — Telehealth: Payer: Self-pay | Admitting: Family Medicine

## 2017-08-20 DIAGNOSIS — E039 Hypothyroidism, unspecified: Secondary | ICD-10-CM

## 2017-08-20 NOTE — Telephone Encounter (Signed)
Please call patient and advised that her thyroid function was borderline.  I have placed some additional orders in the lab to get some more detailed testing of her thyroid.  She may return to the lab and get this testing performed.  Her labs show she is not diabetic.  I would like her to keep her scheduled follow-up so that we can go over all of her labs in more detail.  She does not need to be fasting to get the blood work completed.

## 2017-08-20 NOTE — Telephone Encounter (Signed)
Patient informed of message below, verbalized understanding.  

## 2017-08-21 LAB — T3, FREE: T3, Free: 2.5 pg/mL (ref 2.3–4.2)

## 2017-08-21 LAB — T4, FREE: Free T4: 0.9 ng/dL (ref 0.8–1.8)

## 2017-08-21 LAB — TSH: TSH: 5.65 mIU/L — ABNORMAL HIGH (ref 0.40–4.50)

## 2017-08-26 ENCOUNTER — Ambulatory Visit (HOSPITAL_COMMUNITY): Payer: Medicaid Other

## 2017-08-26 ENCOUNTER — Encounter: Payer: Self-pay | Admitting: Family Medicine

## 2017-08-27 ENCOUNTER — Other Ambulatory Visit: Payer: Self-pay

## 2017-08-27 ENCOUNTER — Ambulatory Visit (INDEPENDENT_AMBULATORY_CARE_PROVIDER_SITE_OTHER): Payer: Medicaid Other | Admitting: Family Medicine

## 2017-08-27 ENCOUNTER — Encounter: Payer: Self-pay | Admitting: Family Medicine

## 2017-08-27 VITALS — BP 165/80 | HR 87 | Temp 98.4°F | Resp 17 | Ht 65.0 in | Wt 248.2 lb

## 2017-08-27 DIAGNOSIS — E039 Hypothyroidism, unspecified: Secondary | ICD-10-CM

## 2017-08-27 DIAGNOSIS — R7303 Prediabetes: Secondary | ICD-10-CM

## 2017-08-27 DIAGNOSIS — I10 Essential (primary) hypertension: Secondary | ICD-10-CM

## 2017-08-27 DIAGNOSIS — E785 Hyperlipidemia, unspecified: Secondary | ICD-10-CM

## 2017-08-27 MED ORDER — TRIAMTERENE-HCTZ 37.5-25 MG PO TABS: 1.0000 | ORAL_TABLET | Freq: Every day | ORAL | 3 refills | Status: DC

## 2017-08-27 MED ORDER — ATORVASTATIN CALCIUM 20 MG PO TABS
20.0000 mg | ORAL_TABLET | Freq: Every day | ORAL | 3 refills | Status: DC
Start: 2017-08-27 — End: 2018-04-09

## 2017-08-27 MED ORDER — VITAMIN B-12 1000 MCG PO TABS: 1000.0000 ug | ORAL_TABLET | Freq: Every day | ORAL | 1 refills | Status: DC

## 2017-08-27 MED ORDER — LEVOTHYROXINE SODIUM 50 MCG PO TABS
50.0000 ug | ORAL_TABLET | Freq: Every day | ORAL | 3 refills | Status: DC
Start: 2017-08-27 — End: 2020-06-27

## 2017-08-27 MED ORDER — LISINOPRIL 40 MG PO TABS: 40.0000 mg | ORAL_TABLET | Freq: Every day | ORAL | 3 refills | Status: DC

## 2017-08-27 NOTE — Patient Instructions (Signed)
  FOR BP:  Stop the lisinopril/hctz 20/25 mg a day  Start : Lisinopril 40 mg a day in the am and triamterene/hctz 37.5/25 mg, 1 a day in the am   Restart the atorvastatin 20 mg at night for cholesterol  Start the levothyroxine 50 mcg a day for thyroid.

## 2017-08-27 NOTE — Progress Notes (Signed)
Patient ID: Laura Jimenez, female    DOB: Nov 21, 1950, 67 y.o.   MRN: [id number]  Chief Complaint  Patient presents with  . Follow-up  . Hypertension    Allergies Ivp dye [iodinated diagnostic agents]; Tape; Cephalexin; and Latex  Subjective:   HALLEY SHEPHEARD is a 67 y.o. female who presents to [facility] today.  HPI Ms. Mcphie presents today for follow-up for blood pressure, thyroid, cholesterol, and to discuss her blood sugars.  She reports that she has been taking the lisinopril/HCTZ each day as directed.  She is stop the clonidine because she was not able to be compliant with the twice daily dosing.  She reports she has been feeling fine.  Denies any chest pain, shortness of breath or swelling in her extremities.  Reports she does stay tired a good bit of the time and her energy is low.  Reports her mood is good and she does not feel sad or depressed.  She reports she has good friends and family support.  She is taking her medications as directed by her infectious disease doctor.  She has not had any recent infections or upper respiratory trouble.  She did not bring her immunization record with her today but is going to check on the status of her immunizations and let me know.  She is here today to talk about her blood sugars.  She has been gaining some weight and has not really been working on her diet.  She has never been diabetic in the past.  She reports that she does not want to become diabetic.  She had stopped her cholesterol medication for a while but reports that she will take it if necessary.  She does have a history of stroke but is not been on aspirin for many years.  At last visit her thyroid was checked due to the fact that her energy was low and she had been gaining weight.  Her TSH was elevated and her T3 and T4 levels were on the low end.  She is interested in starting a medication of thyroid replacement.  She denies any palpitations.  She denies any  difficulty swallowing or neck enlargement.   Thyroid Problem  Presents for initial visit. The condition has lasted for 4 weeks. Symptoms include dry skin, fatigue and weight gain. Patient reports no anxiety, cold intolerance, diaphoresis, diarrhea, heat intolerance, hoarse voice, leg swelling, palpitations, tremors or visual change. Past treatments include nothing. The following procedures have not been performed: radioiodine uptake scan, thyroid FNA, thyroid ultrasound and thyroidectomy. Her past medical history is significant for hyperlipidemia and obesity.  Hypertension  This is a chronic problem. The current episode started more than 1 year ago. The problem has been waxing and waning since onset. The problem is uncontrolled. Associated symptoms include malaise/fatigue. Pertinent negatives include no anxiety, blurred vision, chest pain, headaches, neck pain, orthopnea, palpitations, peripheral edema, PND, shortness of breath or sweats. There are no associated agents to hypertension. Risk factors for coronary artery disease include dyslipidemia, family history, obesity, post-menopausal state and sedentary lifestyle. Past treatments include ACE inhibitors, diuretics and central alpha agonists. The current treatment provides moderate improvement. Compliance problems include diet and exercise.  Hypertensive end-organ damage includes kidney disease and CVA.  Hyperlipidemia  This is a chronic problem. The current episode started more than 1 year ago. The problem is uncontrolled. Recent lipid tests were reviewed and are high. Exacerbating diseases include hypothyroidism and obesity. Factors aggravating her hyperlipidemia include  fatty foods and thiazides. Pertinent negatives include no chest pain, myalgias or shortness of breath. Current antihyperlipidemic treatment includes diet change. Compliance problems include adherence to diet and adherence to exercise.     Past Medical History:  Diagnosis Date  .  Cancer ([place])   . CVA (cerebral infarction)   . HIV infection ([place])   . HTN (hypertension)   . Hyperlipidemia   . Morbid obesity with BMI of 40.0-44.9, adult ([place])   . Postmenopausal bleeding   . Stroke [facility])    no residual paralysis    Past Surgical History:  Procedure Laterality Date  . ABDOMINAL HYSTERECTOMY    . cervical cancer    . COLONOSCOPY  2000  . HYSTEROSCOPY W/D&C N/A 04/19/2013   Procedure: DILATATION AND CURETTAGE /HYSTEROSCOPY;  Surgeon: Emily Filbert, MD;  Location: [facility];  Service: Gynecology;  Laterality: N/A;    Family History  Problem Relation Age of Onset  . Hypertension Mother   . Hypertension Sister   . Arthritis Sister   . Cancer Other   . Diabetes Other      Social History   Socioeconomic History  . Marital status: Widowed    Spouse name: None  . Number of children: None  . Years of education: None  . Highest education level: None  Social Needs  . Financial resource strain: None  . Food insecurity - worry: None  . Food insecurity - inability: None  . Transportation needs - medical: None  . Transportation needs - non-medical: None  Occupational History  . None  Tobacco Use  . Smoking status: Former Smoker    Packs/day: 1.00    Years: 5.00    Pack years: 5.00    Types: Cigarettes  . Smokeless tobacco: Never Used  Substance and Sexual Activity  . Alcohol use: No  . Drug use: No  . Sexual activity: Not Currently    Birth control/protection: None, Post-menopausal  Other Topics Concern  . None  Social History Narrative   Grew up in [place], [place].   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   2 children, boy and girl. Son is 48s, and special needs.   Daughter in her 64s, is a Marine scientist.   Eats all food groups.   Wears seatbelt.   Attends church.   Enjoys watching tv, listening to [employer], and spending time with friends.   Enjoys going out to eat.     Review of Systems  Constitutional: Positive for fatigue,  malaise/fatigue and weight gain. Negative for activity change, appetite change, diaphoresis and fever.  HENT: Negative for hoarse voice.   Eyes: Negative for blurred vision and visual disturbance.  Respiratory: Negative for cough, chest tightness and shortness of breath.   Cardiovascular: Negative for chest pain, palpitations, orthopnea, leg swelling and PND.  Gastrointestinal: Negative for abdominal pain, diarrhea, nausea and vomiting.  Endocrine: Negative for cold intolerance and heat intolerance.  Genitourinary: Negative for dysuria, frequency and urgency.  Musculoskeletal: Negative for arthralgias, myalgias and neck pain.  Neurological: Negative for dizziness, tremors, syncope, speech difficulty, light-headedness and headaches.  Hematological: Negative for adenopathy.  Psychiatric/Behavioral: Negative for behavioral problems and dysphoric mood. The patient is not nervous/anxious.      Objective:   BP (!) 165/80 (BP Location: Right Arm, Patient Position: Sitting, Cuff Size: Normal)   Pulse 87   Temp 98.4 F (36.9 C) (Other (Comment))   Resp 17   Ht 5\' 5"  (1.651 m)   Wt 248  lb 4 oz (112.6 kg)   SpO2 95%   BMI 41.31 kg/m   Physical Exam  Constitutional: She is oriented to person, place, and time. She appears well-developed and well-nourished. No distress.  HENT:  Head: Normocephalic and atraumatic.  Eyes: EOM are normal. Pupils are equal, round, and reactive to light.  Neck: Normal range of motion. Neck supple. No JVD present. No thyromegaly present.  Cardiovascular: Normal rate, regular rhythm and normal heart sounds.  Pulmonary/Chest: Effort normal and breath sounds normal. No respiratory distress.  Neurological: She is alert and oriented to person, place, and time. No cranial nerve deficit.  Skin: Skin is warm and dry.  Psychiatric: She has a normal mood and affect. Her behavior is normal. Judgment and thought content normal.  Nursing note and vitals  reviewed.    Assessment and Plan  1. Hyperlipidemia, unspecified hyperlipidemia type Hyperlipidemia and the associated risk of ASCVD were discussed today. Primary vs. Secondary prevention of ASCVD were discussed and how it relates to patient morbidity, mortality, and quality of life. Shared decision making with patient including the risks of statins vs.benefits of ASCVD risk reduction discussed.  Risks of stains discussed including myopathy, rhabdomyoloysis, liver problems, increased risk of diabetes discussed. We discussed heart healthy diet, lifestyle modifications, risk factor modifications, and adherence to the recommended treatment plan. We discussed the need to periodically monitor lipid panel and liver function tests while on statin therapy.  Patient is agreeable to restart Lipitor today.  We did discuss with started the 20 mg dose due to her medication burden and then increased dose if LDL is not to goal.  LDL goal of less than 70. - atorvastatin (LIPITOR) 20 MG tablet; Take 1 tablet (20 mg total) by mouth daily.  Dispense: 90 tablet; Refill: 3  2. Prediabetes Hemoglobin A1c and blood sugars discussed with patient today.  Diet exercise and weight loss recommended.  We will plan to recheck A1c in 3 months.  Hopefully with thyroid medication she will have increased energy and her metabolism will be improved and patient will lose weight.  3. Hypothyroidism, unspecified type Risk versus benefits of thyroid medication discussed with patient today.  She would like to proceed with medication.  We discussed that she need to take this medication on a daily basis without food.  She was told to take the medication 30 minutes before eating or 2 hours after eating.  She was also told not to take it with other vitamins or medications.  She voiced understanding.  We will plan on rechecking her levels in 3 months or sooner if needed. - levothyroxine (SYNTHROID, LEVOTHROID) 50 MCG tablet; Take 1 tablet (50 mcg  total) by mouth daily.  Dispense: 90 tablet; Refill: 3  4. HTN, goal below 140/90 Stop lisinopril/HCTZ.  Increase lisinopril to 40 mg a day and start Maxide each day as directed. Lifestyle modifications discussed with patient including a diet emphasizing vegetables, fruits, and whole grains. Limiting intake of sodium to less than 2,400 mg per day.  Recommendations discussed include consuming low-fat dairy products, poultry, fish, legumes, non-tropical vegetable oils, and nuts; and limiting intake of sweets, sugar-sweetened beverages, and red meat. Discussed following a plan such as the Dietary Approaches to Stop Hypertension (DASH) diet. Patient to read up on this diet.  We will plan on starting aspirin therapy after blood pressure is less than 010 systolic. - lisinopril (PRINIVIL,ZESTRIL) 40 MG tablet; Take 1 tablet (40 mg total) by mouth daily.  Dispense: 90 tablet; Refill: 3 -  triamterene-hydrochlorothiazide (MAXZIDE-25) 37.5-25 MG tablet; Take 1 tablet by mouth daily.  Dispense: 90 tablet; Refill: 3   Immunization records requested.  Patient is to follow-up with her infectious disease doctor.  Patient to call with any questions concerns or worrisome symptoms. Return in about 4 weeks (around 09/24/2017) for BP. Caren Macadam, MD 08/27/2017

## 2017-08-28 ENCOUNTER — Ambulatory Visit (HOSPITAL_COMMUNITY)
Admission: RE | Admit: 2017-08-28 | Discharge: 2017-08-28 | Disposition: A | Discharge status: Home / Self Care | Payer: Medicaid Other | Source: Ambulatory Visit | Attending: Family Medicine | Admitting: Family Medicine

## 2017-08-28 DIAGNOSIS — Z1231 Encounter for screening mammogram for malignant neoplasm of breast: Secondary | ICD-10-CM | POA: Diagnosis not present

## 2017-09-25 ENCOUNTER — Other Ambulatory Visit: Payer: Self-pay

## 2017-09-25 ENCOUNTER — Ambulatory Visit (INDEPENDENT_AMBULATORY_CARE_PROVIDER_SITE_OTHER): Payer: Medicaid Other | Admitting: Family Medicine

## 2017-09-25 ENCOUNTER — Encounter: Payer: Self-pay | Admitting: Family Medicine

## 2017-09-25 VITALS — BP 162/72 | HR 65 | Temp 98.8°F | Resp 16 | Ht 66.0 in | Wt 248.8 lb

## 2017-09-25 DIAGNOSIS — Z1211 Encounter for screening for malignant neoplasm of colon: Secondary | ICD-10-CM

## 2017-09-25 DIAGNOSIS — I639 Cerebral infarction, unspecified: Secondary | ICD-10-CM | POA: Diagnosis not present

## 2017-09-25 DIAGNOSIS — I10 Essential (primary) hypertension: Secondary | ICD-10-CM

## 2017-09-25 DIAGNOSIS — R7989 Other specified abnormal findings of blood chemistry: Secondary | ICD-10-CM | POA: Diagnosis not present

## 2017-09-25 DIAGNOSIS — Z23 Encounter for immunization: Secondary | ICD-10-CM

## 2017-09-25 DIAGNOSIS — E538 Deficiency of other specified B group vitamins: Secondary | ICD-10-CM

## 2017-09-25 MED ORDER — ASPIRIN 81 MG PO TBEC: 81.0000 mg | DELAYED_RELEASE_TABLET | Freq: Every day | ORAL | 12 refills | Status: AC

## 2017-09-25 MED ORDER — AMLODIPINE BESYLATE 10 MG PO TABS
10.0000 mg | ORAL_TABLET | Freq: Every day | ORAL | 3 refills | Status: DC
Start: 2017-09-25 — End: 2017-10-23

## 2017-09-25 MED ORDER — VITAMIN B-12 1000 MCG PO TABS: 1000.0000 ug | ORAL_TABLET | Freq: Every day | ORAL | Status: DC

## 2017-09-25 NOTE — Progress Notes (Signed)
Patient ID: Laura Jimenez, female    DOB: 12-10-1950, 67 y.o.   MRN: [id number]  Chief Complaint  Patient presents with  . Follow-up    Allergies Ivp dye [iodinated diagnostic agents]; Tape; Cephalexin; and Latex  Subjective:   Laura Jimenez is a 67 y.o. female who presents to [facility] today.  HPI Here for follow-up of her blood pressure.  She reports that she has been taking her medication each day as directed.  She reports that she does feel better.  Denies any swelling in her lower extremities.  Denies any chest pain, shortness of breath, or palpitations.  She did not bring in her immunization record today.  In review of her health maintenance, she is due for a colonoscopy.  She is also due for a tetanus shot.  She reports it is been over 7 years since she received her last tetanus shot.  She would like to discuss her B12.  She has been taking the fluid pill each day as directed.  She is not currently taking an aspirin for secondary prevention from her stroke  She reports that she is to be on aspirin and then she ended up stopping it because other medications were added.  She denies any history of a GI bleed, dyspepsia, chronic steroid use or NSAID use.  She does not have any residual symptoms from her stroke.    Past Medical History:  Diagnosis Date  . Cancer ([place])   . CVA (cerebral infarction)   . HIV infection ([place])   . HTN (hypertension)   . Hyperlipidemia   . Morbid obesity with BMI of 40.0-44.9, adult ([place])   . Postmenopausal bleeding   . Stroke [facility])    no residual paralysis    Past Surgical History:  Procedure Laterality Date  . ABDOMINAL HYSTERECTOMY    . cervical cancer    . COLONOSCOPY  2000  . HYSTEROSCOPY W/D&C N/A 04/19/2013   Procedure: DILATATION AND CURETTAGE /HYSTEROSCOPY;  Surgeon: Emily Filbert, MD;  Location: [facility];  Service: Gynecology;  Laterality: N/A;    Family History  Problem Relation Age of Onset  . Hypertension Mother    . Hypertension Sister   . Arthritis Sister   . Cancer Other   . Diabetes Other      Social History   Socioeconomic History  . Marital status: Widowed    Spouse name: None  . Number of children: None  . Years of education: None  . Highest education level: None  Social Needs  . Financial resource strain: None  . Food insecurity - worry: None  . Food insecurity - inability: None  . Transportation needs - medical: None  . Transportation needs - non-medical: None  Occupational History  . None  Tobacco Use  . Smoking status: Former Smoker    Packs/day: 1.00    Years: 5.00    Pack years: 5.00    Types: Cigarettes  . Smokeless tobacco: Never Used  Substance and Sexual Activity  . Alcohol use: No  . Drug use: No  . Sexual activity: Not Currently    Birth control/protection: None, Post-menopausal  Other Topics Concern  . None  Social History Narrative   Grew up in [place], [place].   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   2 children, boy and girl. Son is 29s, and special needs.   Daughter in her 92s, is a Marine scientist.   Eats all food groups.  Wears seatbelt.   Attends church.   Enjoys watching tv, listening to [employer], and spending time with friends.   Enjoys going out to eat.    Current Outpatient Medications on File Prior to Visit  Medication Sig Dispense Refill  . atorvastatin (LIPITOR) 20 MG tablet Take 1 tablet (20 mg total) by mouth daily. 90 tablet 3  . Bioflavonoid Products (VITAMIN C) CHEW Chew 1 tablet by mouth daily.    . darunavir (PREZISTA) 600 MG tablet Take 600 mg by mouth 2 (two) times daily with a meal.    . emtricitabine (EMTRIVA) 200 MG capsule Take 200 mg by mouth daily.    Marland Kitchen levothyroxine (SYNTHROID, LEVOTHROID) 50 MCG tablet Take 1 tablet (50 mcg total) by mouth daily. 90 tablet 3  . lisinopril (PRINIVIL,ZESTRIL) 40 MG tablet Take 1 tablet (40 mg total) by mouth daily. 90 tablet 3  . raltegravir (ISENTRESS) 400 MG tablet Take 400 mg by  mouth 2 (two) times daily.    . ritonavir (NORVIR) 100 MG TABS Take by mouth 2 (two) times daily with a meal.    . triamterene-hydrochlorothiazide (MAXZIDE-25) 37.5-25 MG tablet Take 1 tablet by mouth daily. 90 tablet 3  . vitamin B-12 (CYANOCOBALAMIN) 1000 MCG tablet Take 1 tablet (1,000 mcg total) by mouth daily. 90 tablet 1   No current facility-administered medications on file prior to visit.     Review of Systems  Constitutional: Negative for activity change, appetite change and fever.  Eyes: Negative for visual disturbance.  Respiratory: Negative for cough, chest tightness and shortness of breath.   Cardiovascular: Negative for chest pain, palpitations and leg swelling.  Gastrointestinal: Negative for abdominal pain, anal bleeding, blood in stool, nausea and vomiting.  Genitourinary: Negative for dysuria, frequency and urgency.  Musculoskeletal: Negative for myalgias.  Skin: Negative for rash.  Neurological: Negative for dizziness, syncope, weakness and light-headedness.  Hematological: Negative for adenopathy.  Psychiatric/Behavioral: Negative for dysphoric mood.     Objective:   BP (!) 162/72 (BP Location: Left Arm, Patient Position: Sitting, Cuff Size: Normal)   Pulse 65   Temp 98.8 F (37.1 C) (Temporal)   Resp 16   Ht 5\' 6"  (1.676 m)   Wt 248 lb 12 oz (112.8 kg)   SpO2 99%   BMI 40.15 kg/m   Physical Exam  Constitutional: She is oriented to person, place, and time. She appears well-developed and well-nourished. No distress.  HENT:  Head: Normocephalic and atraumatic.  Eyes: Pupils are equal, round, and reactive to light.  Neck: Normal range of motion. Neck supple. No thyromegaly present.  Cardiovascular: Normal rate, regular rhythm and normal heart sounds.  Pulmonary/Chest: Effort normal and breath sounds normal. No respiratory distress.  Neurological: She is alert and oriented to person, place, and time. No cranial nerve deficit.  Skin: Skin is warm and dry.    Nursing note and vitals reviewed.    Assessment and Plan  1. Low serum vitamin B12 Patient wanted to discuss her B12 levels again today.  We discussed B12.  She will start this medication over-the-counter. - vitamin B-12 (CYANOCOBALAMIN) 1000 MCG tablet; Take 1 tablet (1,000 mcg total) by mouth daily.  2. Immunization due Exudation given today.  I did ask her to please obtain her vaccination record from her infectious disease doctor and bring to our office. - Tdap vaccine greater than or equal to 7yo IM  3. Essential hypertension Pressure improved but still uncontrolled.  Start Norvasc 10 mg 1 p.o. daily.  Continue other medications as directed. - amLODipine (NORVASC) 10 MG tablet; Take 1 tablet (10 mg total) by mouth daily.  Dispense: 90 tablet; Refill: 3  4. Cerebrovascular accident (CVA), unspecified mechanism ([place]) Discussed with patient regarding aspirin use.  She reports that she used to take aspirin and then stopped it.  She reports that she took this after her stroke and then with other diagnoses and conditions she ended up stopping the aspirin.  We did discuss secondary to her blood pressure, history of stroke that aspirin would be recommended.  She has no prior history of GI bleeds or current/chronic steroid use or ulcers.  Aspirin is recommended at this time.  We did discuss the risks versus benefits and bleeding risks associated with aspirin use. - aspirin (PX ENTERIC ASPIRIN) 81 MG EC tablet; Take 1 tablet (81 mg total) by mouth daily. Swallow whole.  Dispense: 30 tablet; Refill: 12  5. Screen for colon cancer Referral for colonoscopy placed - Ambulatory referral to Gastroenterology   We will plan on rechecking thyroid function when she follows up.  Synthroid was increased to 50 mcg over a month ago.  Will check TSH, BMP, and discuss cholesterol goals again at follow-up. No Follow-up on file. Caren Macadam, MD 09/25/2017

## 2017-10-23 ENCOUNTER — Encounter: Payer: Self-pay | Admitting: Family Medicine

## 2017-10-23 ENCOUNTER — Ambulatory Visit (INDEPENDENT_AMBULATORY_CARE_PROVIDER_SITE_OTHER): Payer: Medicaid Other | Admitting: Family Medicine

## 2017-10-23 ENCOUNTER — Other Ambulatory Visit: Payer: Self-pay

## 2017-10-23 VITALS — BP 160/80 | HR 63 | Temp 97.9°F | Resp 16 | Ht 66.0 in | Wt 250.2 lb

## 2017-10-23 DIAGNOSIS — I639 Cerebral infarction, unspecified: Secondary | ICD-10-CM

## 2017-10-23 DIAGNOSIS — E039 Hypothyroidism, unspecified: Secondary | ICD-10-CM | POA: Insufficient documentation

## 2017-10-23 DIAGNOSIS — E782 Mixed hyperlipidemia: Secondary | ICD-10-CM

## 2017-10-23 DIAGNOSIS — I10 Essential (primary) hypertension: Secondary | ICD-10-CM | POA: Diagnosis not present

## 2017-10-23 MED ORDER — CLONIDINE 0.1 MG/24HR TD PTWK: 0.1000 mg | MEDICATED_PATCH | TRANSDERMAL | 1 refills | Status: DC

## 2017-10-23 NOTE — Progress Notes (Signed)
Patient ID: Laura Jimenez, female    DOB: 02-01-1951, 67 y.o.   MRN: [id number]  Chief Complaint  Patient presents with  . Follow-up    Allergies Ivp dye [iodinated diagnostic agents]; Tape; Cephalexin; and Latex  Subjective:   Laura Jimenez is a 67 y.o. female who presents to [facility] today.  HPI Here for follow up. Comes in today b/c she reports that she cannot take the norvasc. She reports that the norvasc makes her legs swell. Stopped the medication after a few weeks b/c had swelling in both of her legs. Reports that as the day would go on that her legs would swell more and more. Would like to go back on the clonidine. Used to take clonidine 0.2 mg a day. Reports that she did not have a problem with this medication and would like to be back on it.  She is taking her cholesterol medication.  No myalgias.  LDL was still not to goal. Has been taking her thyroid medication each day.  Energy level is fair.  Mood is good.  Has not lost any weight. Is still seeing her infectious disease doctor.  Does not want to get any immunizations today. Taking her aspirin as directed.  Denies any numbness or tingling in her extremities.  Denies any GI pain or problems.    Past Medical History:  Diagnosis Date  . Cancer ([place])   . CVA (cerebral infarction)   . HIV infection ([place])   . HTN (hypertension)   . Hyperlipidemia   . Morbid obesity with BMI of 40.0-44.9, adult ([place])   . Postmenopausal bleeding   . Stroke [facility])    no residual paralysis    Past Surgical History:  Procedure Laterality Date  . ABDOMINAL HYSTERECTOMY    . cervical cancer    . COLONOSCOPY  2000  . HYSTEROSCOPY W/D&C N/A 04/19/2013   Procedure: DILATATION AND CURETTAGE /HYSTEROSCOPY;  Surgeon: Emily Filbert, MD;  Location: [facility];  Service: Gynecology;  Laterality: N/A;    Family History  Problem Relation Age of Onset  . Hypertension Mother   . Hypertension Sister   . Arthritis Sister   .  Cancer Other   . Diabetes Other      Social History   Socioeconomic History  . Marital status: Widowed    Spouse name: None  . Number of children: None  . Years of education: None  . Highest education level: None  Social Needs  . Financial resource strain: None  . Food insecurity - worry: None  . Food insecurity - inability: None  . Transportation needs - medical: None  . Transportation needs - non-medical: None  Occupational History  . None  Tobacco Use  . Smoking status: Former Smoker    Packs/day: 1.00    Years: 5.00    Pack years: 5.00    Types: Cigarettes  . Smokeless tobacco: Never Used  Substance and Sexual Activity  . Alcohol use: No  . Drug use: No  . Sexual activity: Not Currently    Birth control/protection: None, Post-menopausal  Other Topics Concern  . None  Social History Narrative   Grew up in [place], [place].   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   2 children, boy and girl. Son is 56s, and special needs.   Daughter in her 4s, is a Marine scientist.   Eats all food groups.   Wears seatbelt.   Attends church.   Enjoys watching  tv, listening to [employer], and spending time with friends.   Enjoys going out to eat.    Current Outpatient Medications on File Prior to Visit  Medication Sig Dispense Refill  . aspirin (PX ENTERIC ASPIRIN) 81 MG EC tablet Take 1 tablet (81 mg total) by mouth daily. Swallow whole. 30 tablet 12  . atorvastatin (LIPITOR) 20 MG tablet Take 1 tablet (20 mg total) by mouth daily. 90 tablet 3  . bictegravir-emtricitabine-tenofovir AF (BIKTARVY) 50-200-25 MG TABS tablet Take by mouth.    Marland Kitchen Bioflavonoid Products (VITAMIN C) CHEW Chew 1 tablet by mouth daily.    . darunavir (PREZISTA) 600 MG tablet Take 600 mg by mouth 2 (two) times daily with a meal.    . doravirine (PIFELTRO) 100 MG TABS tablet Take by mouth.    Marland Kitchen emtricitabine (EMTRIVA) 200 MG capsule Take 200 mg by mouth daily.    Marland Kitchen levothyroxine (SYNTHROID, LEVOTHROID) 50  MCG tablet Take 1 tablet (50 mcg total) by mouth daily. 90 tablet 3  . lisinopril (PRINIVIL,ZESTRIL) 40 MG tablet Take 1 tablet (40 mg total) by mouth daily. 90 tablet 3  . raltegravir (ISENTRESS) 400 MG tablet Take 400 mg by mouth 2 (two) times daily.    . ranitidine (ZANTAC) 300 MG tablet Take by mouth.    . ritonavir (NORVIR) 100 MG TABS Take by mouth 2 (two) times daily with a meal.    . triamterene-hydrochlorothiazide (MAXZIDE-25) 37.5-25 MG tablet Take 1 tablet by mouth daily. 90 tablet 3  . vitamin B-12 (CYANOCOBALAMIN) 1000 MCG tablet Take 1 tablet (1,000 mcg total) by mouth daily. 90 tablet 1   No current facility-administered medications on file prior to visit.     Review of Systems  Constitutional: Negative for chills, fever and unexpected weight change.  HENT: Negative for trouble swallowing.   Respiratory: Negative for cough and chest tightness.   Cardiovascular: Negative for chest pain, palpitations and leg swelling.  Gastrointestinal: Negative for abdominal pain, diarrhea, nausea and vomiting.  Musculoskeletal: Negative for arthralgias and myalgias.  Skin: Negative for rash.  Neurological: Negative for dizziness, tremors, weakness and numbness.  Psychiatric/Behavioral: Negative for dysphoric mood. The patient is not nervous/anxious.      Objective:   BP (!) 160/80 (BP Location: Left Arm, Patient Position: Sitting, Cuff Size: Normal)   Pulse 63   Temp 97.9 F (36.6 C) (Temporal)   Resp 16   Ht 5\' 6"  (1.676 m)   Wt 250 lb 4 oz (113.5 kg)   SpO2 99%   BMI 40.39 kg/m   Physical Exam  Constitutional: She is oriented to person, place, and time. She appears well-developed and well-nourished.  HENT:  Head: Normocephalic and atraumatic.  Eyes: EOM are normal. Pupils are equal, round, and reactive to light.  Neck: Normal range of motion. Neck supple. No thyromegaly present.  Cardiovascular: Normal rate, regular rhythm and normal heart sounds.  Pulmonary/Chest: Effort  normal and breath sounds normal.  Neurological: She is alert and oriented to person, place, and time. No cranial nerve deficit.  Skin: Skin is warm.  Psychiatric: She has a normal mood and affect. Her behavior is normal. Judgment and thought content normal.  Vitals reviewed. No edema in lower extremities bilaterally.   Assessment and Plan  1. Essential hypertension We will discontinue amlodipine at this time off her med list.  She has not been taking this medication for quite some time.  I did check her blood pressure cuff in the office today and it  is reading correctly and compatible with the reading that we get.  Will start clonidine as directed.  She was counseled concerning the risks versus the benefits of this medication and she does request to be back on this medication. - cloNIDine (CATAPRES - DOSED IN MG/24 HR) 0.1 mg/24hr patch; Place 1 patch (0.1 mg total) onto the skin once a week.  Dispense: 4 patch; Refill: 1 - COMPLETE METABOLIC PANEL WITH GFR  2. Hyperlipidemia, mixed LDL still not to goal.  Will get labs done prior to next visit. - COMPLETE METABOLIC PANEL WITH GFR - Lipid panel  3. Cerebrovascular accident (CVA), unspecified mechanism ([place]) Continue aspirin as directed.  Continue risk factor modification.  4. Morbid obesity (H[facility]) Diet, exercise, and weight loss recommended.  5. Hypothyroidism, unspecified type She will get labs done prior to next office visit so that we can discuss. - TSH Office visit 5 minutes.  Greater than 50% spent counseling and coordinating care.  Counseled on blood pressure, use of clonidine patch, continuation and compliance with medication, appropriate way to take thyroid medication.  Discussed cardiovascular risks and risk factor reduction.  Discussed with patient that she did need to work on losing weight.  She voiced understanding. Return in about 4 weeks (around 11/20/2017) for Blood pressure check. Caren Macadam, MD 10/23/2017

## 2017-10-23 NOTE — Patient Instructions (Addendum)
Follow up in 4 weeks.   Call me if BP is elevated and I will increase the dose of the clonidine in 2 weeks. Call with BP readings.   Come to the lab for FASTING bloodwork 2 days before you next office visit.

## 2017-11-17 ENCOUNTER — Telehealth: Payer: Self-pay | Admitting: Family Medicine

## 2017-11-17 NOTE — Telephone Encounter (Signed)
Pt stopped by and would like her Blood Pressure patch increased

## 2017-11-17 NOTE — Telephone Encounter (Signed)
Called patient regarding message below. No answer, unable to leave message. I need more information on why she wants an increase. Blood pressure records, side effects, etc.

## 2017-11-18 ENCOUNTER — Telehealth: Payer: Self-pay | Admitting: Family Medicine

## 2017-11-18 DIAGNOSIS — I10 Essential (primary) hypertension: Secondary | ICD-10-CM

## 2017-11-18 MED ORDER — CLONIDINE 0.2 MG/24HR TD PTWK: 0.2000 mg | MEDICATED_PATCH | TRANSDERMAL | 1 refills | Status: DC

## 2017-11-18 NOTE — Telephone Encounter (Signed)
Called patient regarding message below. No answer, left generic message for patient to return call.   

## 2017-11-18 NOTE — Telephone Encounter (Signed)
I did tell patient that if her blood pressures are still running high that I would increase the clonidine patch prior to her next visit.  Please advise her that at this time we will go up to the clonidine 0.2 mg patch, apply one to skin once a week as directed.  She should stop using the clonidine 0.1 mg patch and use this patch.  She does only apply 1 patch to the skin.  She should continue her other blood pressure medications.  She needs to keep her scheduled follow-up.

## 2017-11-18 NOTE — Telephone Encounter (Signed)
Patient informed of message below, verbalized understanding.  

## 2017-11-18 NOTE — Telephone Encounter (Signed)
Patient states blood pressure is still high. [id number] systolic. Lowest is still 368Z systolic. She states she was told to call and let Dr. Mannie Stabile know if she wanted it increased.

## 2017-11-26 ENCOUNTER — Other Ambulatory Visit: Payer: Self-pay

## 2017-11-26 ENCOUNTER — Ambulatory Visit (INDEPENDENT_AMBULATORY_CARE_PROVIDER_SITE_OTHER): Payer: Medicaid Other | Admitting: Family Medicine

## 2017-11-26 ENCOUNTER — Encounter: Payer: Self-pay | Admitting: Family Medicine

## 2017-11-26 VITALS — BP 160/80 | HR 69 | Temp 97.1°F | Resp 16 | Ht 66.0 in | Wt 251.2 lb

## 2017-11-26 DIAGNOSIS — I10 Essential (primary) hypertension: Secondary | ICD-10-CM

## 2017-11-26 DIAGNOSIS — E039 Hypothyroidism, unspecified: Secondary | ICD-10-CM | POA: Diagnosis not present

## 2017-11-26 DIAGNOSIS — E782 Mixed hyperlipidemia: Secondary | ICD-10-CM

## 2017-11-26 MED ORDER — CLONIDINE 0.3 MG/24HR TD PTWK: 0.3000 mg | MEDICATED_PATCH | TRANSDERMAL | 12 refills | Status: DC

## 2017-11-26 NOTE — Patient Instructions (Addendum)
Call with BP readings in 2 weeks and we will adjust medication if needed prior to follow up. If you can do it: get your labs done a day before your visit-they have been ordered. Try to keep a diet diary for 1 week and bring it in to your visit.  Cut out soda!!!    DASH Eating Plan DASH stands for "Dietary Approaches to Stop Hypertension." The DASH eating plan is a healthy eating plan that has been shown to reduce high blood pressure (hypertension). It may also reduce your risk for type 2 diabetes, heart disease, and stroke. The DASH eating plan may also help with weight loss. What are tips for following this plan? General guidelines  Avoid eating more than 2,300 mg (milligrams) of salt (sodium) a day. If you have hypertension, you may need to reduce your sodium intake to 1,500 mg a day.  Limit alcohol intake to no more than 1 drink a day for nonpregnant women and 2 drinks a day for men. One drink equals 12 oz of beer, 5 oz of wine, or 1 oz of hard liquor.  Work with your health care provider to maintain a healthy body weight or to lose weight. Ask what an ideal weight is for you.  Get at least 30 minutes of exercise that causes your heart to beat faster (aerobic exercise) most days of the week. Activities may include walking, swimming, or biking.  Work with your health care provider or diet and nutrition specialist (dietitian) to adjust your eating plan to your individual calorie needs. Reading food labels  Check food labels for the amount of sodium per serving. Choose foods with less than 5 percent of the Daily Value of sodium. Generally, foods with less than 300 mg of sodium per serving fit into this eating plan.  To find whole grains, look for the word "whole" as the first word in the ingredient list. Shopping  Buy products labeled as "low-sodium" or "no salt added."  Buy fresh foods. Avoid canned foods and premade or frozen meals. Cooking  Avoid adding salt when cooking. Use  salt-free seasonings or herbs instead of table salt or sea salt. Check with your health care provider or pharmacist before using salt substitutes.  Do not fry foods. Cook foods using healthy methods such as baking, boiling, grilling, and broiling instead.  Cook with heart-healthy oils, such as olive, canola, soybean, or sunflower oil. Meal planning   Eat a balanced diet that includes: ? 5 or more servings of fruits and vegetables each day. At each meal, try to fill half of your plate with fruits and vegetables. ? Up to 6-8 servings of whole grains each day. ? Less than 6 oz of lean meat, poultry, or fish each day. A 3-oz serving of meat is about the same size as a deck of cards. One egg equals 1 oz. ? 2 servings of low-fat dairy each day. ? A serving of nuts, seeds, or beans 5 times each week. ? Heart-healthy fats. Healthy fats called Omega-3 fatty acids are found in foods such as flaxseeds and coldwater fish, like sardines, salmon, and mackerel.  Limit how much you eat of the following: ? Canned or prepackaged foods. ? Food that is high in trans fat, such as fried foods. ? Food that is high in saturated fat, such as fatty meat. ? Sweets, desserts, sugary drinks, and other foods with added sugar. ? Full-fat dairy products.  Do not salt foods before eating.  Try to eat  at least 2 vegetarian meals each week.  Eat more home-cooked food and less restaurant, buffet, and fast food.  When eating at a restaurant, ask that your food be prepared with less salt or no salt, if possible. What foods are recommended? The items listed may not be a complete list. Talk with your dietitian about what dietary choices are best for you. Grains Whole-grain or whole-wheat bread. Whole-grain or whole-wheat pasta. Brown rice. [place]. Bulgur. Whole-grain and low-sodium cereals. Pita bread. Low-fat, low-sodium crackers. Whole-wheat flour tortillas. Vegetables Fresh or frozen vegetables (raw, steamed,  roasted, or grilled). Low-sodium or reduced-sodium tomato and vegetable juice. Low-sodium or reduced-sodium tomato sauce and tomato paste. Low-sodium or reduced-sodium canned vegetables. Fruits All fresh, dried, or frozen fruit. Canned fruit in natural juice (without added sugar). Meat and other protein foods Skinless chicken or [place]. Ground chicken or [place]. Pork with fat trimmed off. Fish and seafood. Egg whites. Dried beans, peas, or lentils. Unsalted nuts, nut butters, and seeds. Unsalted canned beans. Lean cuts of beef with fat trimmed off. Low-sodium, lean deli meat. Dairy Low-fat (1%) or fat-free (skim) milk. Fat-free, low-fat, or reduced-fat cheeses. Nonfat, low-sodium ricotta or cottage cheese. Low-fat or nonfat yogurt. Low-fat, low-sodium cheese. Fats and oils Soft margarine without trans fats. Vegetable oil. Low-fat, reduced-fat, or light mayonnaise and salad dressings (reduced-sodium). Canola, safflower, olive, soybean, and sunflower oils. Avocado. Seasoning and other foods Herbs. Spices. Seasoning mixes without salt. Unsalted popcorn and pretzels. Fat-free sweets. What foods are not recommended? The items listed may not be a complete list. Talk with your dietitian about what dietary choices are best for you. Grains Baked goods made with fat, such as croissants, muffins, or some breads. Dry pasta or rice meal packs. Vegetables Creamed or fried vegetables. Vegetables in a cheese sauce. Regular canned vegetables (not low-sodium or reduced-sodium). Regular canned tomato sauce and paste (not low-sodium or reduced-sodium). Regular tomato and vegetable juice (not low-sodium or reduced-sodium). Angie Fava. Olives. Fruits Canned fruit in a light or heavy syrup. Fried fruit. Fruit in cream or butter sauce. Meat and other protein foods Fatty cuts of meat. Ribs. Fried meat. Berniece Salines. Sausage. Bologna and other processed lunch meats. Salami. Fatback. Hotdogs. Bratwurst. Salted nuts and seeds. Canned  beans with added salt. Canned or smoked fish. Whole eggs or egg yolks. Chicken or [place] with skin. Dairy Whole or 2% milk, cream, and half-and-half. Whole or full-fat cream cheese. Whole-fat or sweetened yogurt. Full-fat cheese. Nondairy creamers. Whipped toppings. Processed cheese and cheese spreads. Fats and oils Butter. Stick margarine. Lard. Shortening. Ghee. Bacon fat. Tropical oils, such as coconut, palm kernel, or palm oil. Seasoning and other foods Salted popcorn and pretzels. Onion salt, garlic salt, seasoned salt, table salt, and sea salt. Worcestershire sauce. Tartar sauce. Barbecue sauce. Teriyaki sauce. Soy sauce, including reduced-sodium. Steak sauce. Canned and packaged gravies. Fish sauce. Oyster sauce. Cocktail sauce. Horseradish that you find on the shelf. Ketchup. Mustard. Meat flavorings and tenderizers. Bouillon cubes. Hot sauce and Tabasco sauce. Premade or packaged marinades. Premade or packaged taco seasonings. Relishes. Regular salad dressings. Where to find more information:  National Heart, Lung, and [place]: https://wilson-eaton.com/  American Heart Association: www.heart.org Summary  The DASH eating plan is a healthy eating plan that has been shown to reduce high blood pressure (hypertension). It may also reduce your risk for type 2 diabetes, heart disease, and stroke.  With the DASH eating plan, you should limit salt (sodium) intake to 2,300 mg a day. If you have hypertension,  you may need to reduce your sodium intake to 1,500 mg a day.  When on the DASH eating plan, aim to eat more fresh fruits and vegetables, whole grains, lean proteins, low-fat dairy, and heart-healthy fats.  Work with your health care provider or diet and nutrition specialist (dietitian) to adjust your eating plan to your individual calorie needs. This information is not intended to replace advice given to you by your health care provider. Make sure you discuss any questions you have with your  health care provider. Document Released: 07/31/2011 Document Revised: 08/04/2016 Document Reviewed: 08/04/2016 Elsevier Interactive Patient Education  [employer].

## 2017-11-26 NOTE — Progress Notes (Signed)
Patient ID: Laura Jimenez, female    DOB: 12-08-1950, 67 y.o.   MRN: [id number]  Chief Complaint  Patient presents with  . Follow-up    Allergies Ivp dye [iodinated diagnostic agents]; Tape; Cephalexin; and Latex  Subjective:   Laura Jimenez is a 67 y.o. female who presents to [facility] today.  HPI Laura Jimenez presents today for follow-up of her blood pressure.  Approximately 1 week ago she increased the dose of her clonidine patch to 0.2 mg.  She reports her blood pressures have improved.  She reports that she has not seen blood pressure this well-controlled in over 8 years.  She is still running high.  She is ranging from the 140s-160s over 90s.  She is not symptomatic with these pressures.  She reports that she does eat a lot of salt.  She eats fried food on a daily basis.  She eats fried fish 4 times a week pizza at least 1 times a week, and other fried foods.  She reports she does drink sodas.  She has not made any attempt to try to lose weight or improve her diet.  She is still following with infectious disease and taking all her medications.  She denies any chest pain or shortness of breath.  She has been trying to be more active.  She reports that last week she did mow the yard.  She denies any swelling in her lower extremities.  She has been taking her thyroid medication each day.  She reports that her energy level is better than it was several months ago.  She has not gotten her repeat thyroid her cholesterol done yet.  Hypertension  This is a chronic problem. The current episode started more than 1 year ago. The problem has been waxing and waning since onset. The problem is uncontrolled. Associated symptoms include malaise/fatigue. Pertinent negatives include no anxiety, blurred vision, chest pain, headaches, palpitations, peripheral edema, PND or shortness of breath. There are no associated agents to hypertension. Risk factors for coronary artery disease include  dyslipidemia, family history, obesity, post-menopausal state, stress and sedentary lifestyle. Past treatments include diuretics, alpha 1 blockers and ACE inhibitors. The current treatment provides moderate improvement. Compliance problems include diet and exercise.  Hypertensive end-organ damage includes CVA. There is no history of angina, kidney disease or retinopathy.    Past Medical History:  Diagnosis Date  . Cancer ([place])   . CVA (cerebral infarction)   . HIV infection ([place])   . HTN (hypertension)   . Hyperlipidemia   . Morbid obesity with BMI of 40.0-44.9, adult ([place])   . Postmenopausal bleeding   . Stroke [facility])    no residual paralysis    Past Surgical History:  Procedure Laterality Date  . ABDOMINAL HYSTERECTOMY    . cervical cancer    . COLONOSCOPY  2000  . HYSTEROSCOPY W/D&C N/A 04/19/2013   Procedure: DILATATION AND CURETTAGE /HYSTEROSCOPY;  Surgeon: Laura Filbert, MD;  Location: [facility];  Service: Gynecology;  Laterality: N/A;    Family History  Problem Relation Age of Onset  . Hypertension Mother   . Hypertension Sister   . Arthritis Sister   . Cancer Other   . Diabetes Other      Social History   Socioeconomic History  . Marital status: Widowed    Spouse name: Not on file  . Number of children: Not on file  . Years of education: Not on file  . Highest education level: Not  on file  Occupational History  . Not on file  Social Needs  . Financial resource strain: Not on file  . Food insecurity:    Worry: Not on file    Inability: Not on file  . Transportation needs:    Medical: Not on file    Non-medical: Not on file  Tobacco Use  . Smoking status: Former Smoker    Packs/day: 1.00    Years: 5.00    Pack years: 5.00    Types: Cigarettes  . Smokeless tobacco: Never Used  Substance and Sexual Activity  . Alcohol use: No  . Drug use: No  . Sexual activity: Not Currently    Birth control/protection: None, Post-menopausal  Lifestyle  . Physical  activity:    Days per week: Not on file    Minutes per session: Not on file  . Stress: Not on file  Relationships  . Social connections:    Talks on phone: More than three times a week    Gets together: More than three times a week    Attends religious service: More than 4 times per year    Active member of club or organization: Yes    Attends meetings of clubs or organizations: 1 to 4 times per year    Relationship status: Widowed  Other Topics Concern  . Not on file  Social History Narrative   Grew up in [place], [place].   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   2 children, boy and girl. Son is 76s, and special needs.   Daughter in her 71s, is a Marine scientist.   Eats all food groups.   Wears seatbelt.   Attends church.   Enjoys watching tv, listening to [employer], and spending time with friends.   Enjoys going out to eat.    Current Outpatient Medications on File Prior to Visit  Medication Sig Dispense Refill  . aspirin (PX ENTERIC ASPIRIN) 81 MG EC tablet Take 1 tablet (81 mg total) by mouth daily. Swallow whole. 30 tablet 12  . atorvastatin (LIPITOR) 20 MG tablet Take 1 tablet (20 mg total) by mouth daily. 90 tablet 3  . bictegravir-emtricitabine-tenofovir AF (BIKTARVY) 50-200-25 MG TABS tablet Take by mouth.    Marland Kitchen Bioflavonoid Products (VITAMIN C) CHEW Chew 1 tablet by mouth daily.    . darunavir (PREZISTA) 600 MG tablet Take 600 mg by mouth 2 (two) times daily with a meal.    . doravirine (PIFELTRO) 100 MG TABS tablet Take by mouth.    Marland Kitchen emtricitabine (EMTRIVA) 200 MG capsule Take 200 mg by mouth daily.    Marland Kitchen levothyroxine (SYNTHROID, LEVOTHROID) 50 MCG tablet Take 1 tablet (50 mcg total) by mouth daily. 90 tablet 3  . lisinopril (PRINIVIL,ZESTRIL) 40 MG tablet Take 1 tablet (40 mg total) by mouth daily. 90 tablet 3  . raltegravir (ISENTRESS) 400 MG tablet Take 400 mg by mouth 2 (two) times daily.    . ranitidine (ZANTAC) 300 MG tablet Take by mouth.    . ritonavir  (NORVIR) 100 MG TABS Take by mouth 2 (two) times daily with a meal.    . triamterene-hydrochlorothiazide (MAXZIDE-25) 37.5-25 MG tablet Take 1 tablet by mouth daily. 90 tablet 3  . vitamin B-12 (CYANOCOBALAMIN) 1000 MCG tablet Take 1 tablet (1,000 mcg total) by mouth daily. 90 tablet 1   No current facility-administered medications on file prior to visit.     Review of Systems  Constitutional: Positive for malaise/fatigue. Negative for activity  change, appetite change and fever.       Energy better than it was a few months ago.   HENT: Negative for trouble swallowing.   Eyes: Negative for blurred vision and visual disturbance.  Respiratory: Negative for cough, chest tightness and shortness of breath.   Cardiovascular: Negative for chest pain, palpitations, leg swelling and PND.  Gastrointestinal: Negative for abdominal pain, nausea and vomiting.  Genitourinary: Negative for dysuria, frequency and urgency.  Musculoskeletal: Negative for arthralgias and myalgias.  Neurological: Negative for dizziness, syncope, light-headedness and headaches.  Hematological: Negative for adenopathy.  Psychiatric/Behavioral: Negative for behavioral problems and dysphoric mood. The patient is not nervous/anxious.      Objective:   BP (!) 160/80 (BP Location: Left Arm, Patient Position: Sitting, Cuff Size: Normal)   Pulse 69   Temp (!) 97.1 F (36.2 C) (Temporal)   Resp 16   Ht 5\' 6"  (1.676 m)   Wt 251 lb 4 oz (114 kg)   SpO2 99%   BMI 40.55 kg/m   Physical Exam  Constitutional: She is oriented to person, place, and time. She appears well-developed and well-nourished. No distress.  HENT:  Head: Normocephalic and atraumatic.  Eyes: Pupils are equal, round, and reactive to light.  Neck: Normal range of motion. Neck supple. No thyromegaly present.  Cardiovascular: Normal rate, regular rhythm and normal heart sounds.  Pulmonary/Chest: Effort normal and breath sounds normal. No respiratory distress.    Neurological: She is alert and oriented to person, place, and time. No cranial nerve deficit.  Skin: Skin is warm and dry.  Psychiatric: She has a normal mood and affect. Her behavior is normal. Judgment and thought content normal.  Nursing note and vitals reviewed.    Assessment and Plan  1. Essential hypertension Patient counseled in detail regarding the risks of medication. Told to call or return to clinic if develop any worrisome signs or symptoms. Patient voiced understanding.  Continue all other meds. Lifestyle modifications discussed with patient including a diet emphasizing vegetables, fruits, and whole grains. Limiting intake of sodium to less than 2,400 mg per day.  Recommendations discussed include consuming low-fat dairy products, poultry, fish, legumes, non-tropical vegetable oils, and nuts; and limiting intake of sweets, sugar-sweetened beverages, and red meat. Discussed following a plan such as the Dietary Approaches to Stop Hypertension (DASH) diet. Patient to read up on this diet.  -We will call back with her blood pressure readings in 2 weeks.  If it is still elevated will consider increasing her dose prior to her follow-up. - cloNIDine (CATAPRES - DOSED IN MG/24 HR) 0.3 mg/24hr patch; Place 1 patch (0.3 mg total) onto the skin once a week.  Dispense: 4 patch; Refill: 12 -Continue aspirin as directed. 2. Morbid obesity ([place]) We did discuss the patient has a diagnosis of morbid obesity.  Her BMI is greater than 40.  She also has other cardiovascular risk factors.  We discussed what a healthy weight would be for her.  We discussed dietary and exercise interventions that could help her to lose weight.  At this time she reports she will make small changes and cut down on her fried food, salt intake, and decrease her soda intake.  We discussed that lowering her weight would decrease her risk of cardiovascular disease and help with her blood pressure control.  3. Hypothyroidism,  unspecified type Thyroid medication as directed.  We will plan on her returning to the lab 1-2 days prior to her visit to repeat her TSH.  Test ordered at this time  4. Mixed hyperlipidemia Today we discussed how diet can affect cholesterol levels.  We discussed that increased cholesterol is associated with increased risk of heart attack and stroke. We will continue her Lipitor.  If her LDL is not at goal will plan on increasing upon recheck.  LDL goal less than 70.  OV was 25 minutes. Greater than 50%  OV spend counseling on above.  Return in about 1 month (around 12/24/2017) for Follow up BP. Caren Macadam, MD 11/26/2017

## 2017-12-30 LAB — LIPID PANEL
CHOLESTEROL: 205 mg/dL — AB (ref <200)
HDL: 40 mg/dL — ABNORMAL LOW (ref >50)
LDL CHOLESTEROL (CALC): 139 mg/dL — AB
Non-HDL Cholesterol (Calc): 165 mg/dL (calc) — ABNORMAL HIGH (ref <130)
TRIGLYCERIDES: 132 mg/dL (ref <150)
Total CHOL/HDL Ratio: 5.1 (calc) — ABNORMAL HIGH (ref <5.0)

## 2017-12-30 LAB — COMPLETE METABOLIC PANEL WITH GFR
AG Ratio: 0.7 (calc) — ABNORMAL LOW (ref 1.0–2.5)
ALKALINE PHOSPHATASE (APISO): 69 U/L (ref 33–130)
ALT: 10 U/L (ref 6–29)
AST: 17 U/L (ref 10–35)
Albumin: 3.5 g/dL — ABNORMAL LOW (ref 3.6–5.1)
BILIRUBIN TOTAL: 0.5 mg/dL (ref 0.2–1.2)
BUN: 12 mg/dL (ref 7–25)
CHLORIDE: 103 mmol/L (ref 98–110)
CO2: 30 mmol/L (ref 20–32)
CREATININE: 0.9 mg/dL (ref 0.50–0.99)
Calcium: 9.2 mg/dL (ref 8.6–10.4)
GFR, Est African American: 78 mL/min/{1.73_m2} (ref >=60)
GFR, Est Non African American: 68 mL/min/{1.73_m2} (ref >=60)
GLUCOSE: 95 mg/dL (ref 65–99)
Globulin: 4.8 g/dL (calc) — ABNORMAL HIGH (ref 1.9–3.7)
Potassium: 4.3 mmol/L (ref 3.5–5.3)
Sodium: 137 mmol/L (ref 135–146)
Total Protein: 8.3 g/dL — ABNORMAL HIGH (ref 6.1–8.1)

## 2017-12-30 LAB — TSH: TSH: 7.51 m[IU]/L — AB (ref 0.40–4.50)

## 2017-12-31 ENCOUNTER — Ambulatory Visit (INDEPENDENT_AMBULATORY_CARE_PROVIDER_SITE_OTHER): Payer: Medicaid Other | Admitting: Family Medicine

## 2017-12-31 ENCOUNTER — Encounter: Payer: Self-pay | Admitting: Family Medicine

## 2017-12-31 VITALS — BP 140/78 | HR 60 | Resp 16 | Ht 66.0 in | Wt 253.0 lb

## 2017-12-31 DIAGNOSIS — Z79899 Other long term (current) drug therapy: Secondary | ICD-10-CM | POA: Diagnosis not present

## 2017-12-31 DIAGNOSIS — E039 Hypothyroidism, unspecified: Secondary | ICD-10-CM | POA: Diagnosis not present

## 2017-12-31 DIAGNOSIS — I10 Essential (primary) hypertension: Secondary | ICD-10-CM | POA: Diagnosis not present

## 2017-12-31 DIAGNOSIS — E782 Mixed hyperlipidemia: Secondary | ICD-10-CM

## 2017-12-31 NOTE — Patient Instructions (Addendum)
Please request immunization records from Dr. Apolonio Schneiders Miller/ID, [facility]. We need to make sure patient had both pneumonia shots.    Come to the lab before your next visit and get your blood work done. Come 1-2 days prior to your visit.

## 2017-12-31 NOTE — Progress Notes (Signed)
Patient ID: Laura Jimenez, female    DOB: 05-05-1951, 67 y.o.   MRN: [id number]  Chief Complaint  Patient presents with  . Hypertension    follow up visit     Allergies Ivp dye [iodinated diagnostic agents]; Tape; Cephalexin; and Latex  Subjective:   Laura Jimenez is a 67 y.o. female who presents to [facility] today.  HPI Ms. Khamis presents for follow-up today.  She reports that she has been taking her blood pressure medications each day.  She reports that she initially was not sure if she like how they made her feel so she stopped all her blood pressure medications.  She reports she talked to her daughter about this and her daughter told her that she better take her medications or she could have a stroke.  She reports that she decided to then take her blood pressure medications.  She reports that since that time she her blood pressure has been running well.  She denies any chest pain, shortness of breath, or swelling in her extremities.  She reports that she did decide to stop her cholesterol medicine and her thyroid medication.  She reports she is not exactly sure why she quit them but thought that they may be making her feel fatigued so she quit them.  She did come and get her blood work yesterday which reveals an elevated TSH and elevated cholesterol.  She reports that she wanted to see if the medications were really making a difference.  She reports that since the test revealed these findings that she most likely does need the medications.  She reports that she will take the medications.  She reports there is been some changes in her medications by her infectious disease doctor.  She is continuing to see Dr. Marlinda Mike for her ID needs.  She does have an upcoming colonoscopy scheduled.   Past Medical History:  Diagnosis Date  . Cancer ([place])   . CVA (cerebral infarction)   . HIV infection ([place])   . HTN (hypertension)   . Hyperlipidemia   . Morbid obesity with  BMI of 40.0-44.9, adult ([place])   . Postmenopausal bleeding   . Stroke [facility])    no residual paralysis    Past Surgical History:  Procedure Laterality Date  . ABDOMINAL HYSTERECTOMY    . cervical cancer    . COLONOSCOPY  2000  . HYSTEROSCOPY W/D&C N/A 04/19/2013   Procedure: DILATATION AND CURETTAGE /HYSTEROSCOPY;  Surgeon: Emily Filbert, MD;  Location: [facility];  Service: Gynecology;  Laterality: N/A;    Family History  Problem Relation Age of Onset  . Hypertension Mother   . Hypertension Sister   . Arthritis Sister   . Cancer Other   . Diabetes Other      Social History   Socioeconomic History  . Marital status: Widowed    Spouse name: Not on file  . Number of children: Not on file  . Years of education: Not on file  . Highest education level: Not on file  Occupational History  . Not on file  Social Needs  . Financial resource strain: Not on file  . Food insecurity:    Worry: Not on file    Inability: Not on file  . Transportation needs:    Medical: Not on file    Non-medical: Not on file  Tobacco Use  . Smoking status: Former Smoker    Packs/day: 1.00    Years: 5.00  Pack years: 5.00    Types: Cigarettes  . Smokeless tobacco: Never Used  Substance and Sexual Activity  . Alcohol use: No  . Drug use: No  . Sexual activity: Not Currently    Birth control/protection: None, Post-menopausal  Lifestyle  . Physical activity:    Days per week: Not on file    Minutes per session: Not on file  . Stress: Not on file  Relationships  . Social connections:    Talks on phone: More than three times a week    Gets together: More than three times a week    Attends religious service: More than 4 times per year    Active member of club or organization: Yes    Attends meetings of clubs or organizations: 1 to 4 times per year    Relationship status: Widowed  Other Topics Concern  . Not on file  Social History Narrative   Grew up in [place], [place].   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   Widowed.   2 children, boy and girl. Son is 77s, and special needs.   Daughter in her 23s, is a Marine scientist.   Eats all food groups.   Wears seatbelt.   Attends church.   Enjoys watching tv, listening to [employer], and spending time with friends.   Enjoys going out to eat.    Current Outpatient Medications on File Prior to Visit  Medication Sig Dispense Refill  . aspirin (PX ENTERIC ASPIRIN) 81 MG EC tablet Take 1 tablet (81 mg total) by mouth daily. Swallow whole. 30 tablet 12  . atorvastatin (LIPITOR) 20 MG tablet Take 1 tablet (20 mg total) by mouth daily. 90 tablet 3  . Bioflavonoid Products (VITAMIN C) CHEW Chew 1 tablet by mouth daily.    . cloNIDine (CATAPRES - DOSED IN MG/24 HR) 0.3 mg/24hr patch Place 1 patch (0.3 mg total) onto the skin once a week. 4 patch 12  . doravirine (PIFELTRO) 100 MG TABS tablet Take by mouth.    . levothyroxine (SYNTHROID, LEVOTHROID) 50 MCG tablet Take 1 tablet (50 mcg total) by mouth daily. 90 tablet 3  . lisinopril (PRINIVIL,ZESTRIL) 40 MG tablet Take 1 tablet (40 mg total) by mouth daily. 90 tablet 3  . ranitidine (ZANTAC) 300 MG tablet Take by mouth.    . triamterene-hydrochlorothiazide (MAXZIDE-25) 37.5-25 MG tablet Take 1 tablet by mouth daily. 90 tablet 3  . vitamin B-12 (CYANOCOBALAMIN) 1000 MCG tablet Take 1 tablet (1,000 mcg total) by mouth daily. 90 tablet 1  . bictegravir-emtricitabine-tenofovir AF (BIKTARVY) 50-200-25 MG TABS tablet Take by mouth.    . darunavir (PREZISTA) 600 MG tablet Take 600 mg by mouth 2 (two) times daily with a meal.    . ritonavir (NORVIR) 100 MG TABS Take by mouth 2 (two) times daily with a meal.     No current facility-administered medications on file prior to visit.     Review of Systems  Constitutional: Positive for fatigue. Negative for appetite change, chills, diaphoresis, fever and unexpected weight change.  HENT: Negative for congestion.   Respiratory: Negative for chest tightness,  shortness of breath, wheezing and stridor.   Cardiovascular: Negative for chest pain, palpitations and leg swelling.  Psychiatric/Behavioral: Negative for dysphoric mood.     Objective:   BP 140/78   Pulse 60   Resp 16   Ht 5\' 6"  (1.676 m)   Wt 253 lb (114.8 kg)   SpO2 98%   BMI 40.84 kg/m  Physical Exam  Constitutional: She is oriented to person, place, and time. She appears well-developed and well-nourished. No distress.  HENT:  Head: Normocephalic and atraumatic.  Eyes: Pupils are equal, round, and reactive to light.  Neck: Normal range of motion. Neck supple. No thyromegaly present.  Cardiovascular: Normal rate, regular rhythm and normal heart sounds.  Pulmonary/Chest: Effort normal and breath sounds normal. No respiratory distress.  Neurological: She is alert and oriented to person, place, and time. No cranial nerve deficit.  Skin: Skin is warm and dry.  Psychiatric: She has a normal mood and affect. Her behavior is normal. Thought content normal.  Nursing note and vitals reviewed.    Assessment and Plan  1. Essential hypertension Blood pressure is much improved today.  She should continue all her current medications as directed.  We did discuss the need for compliance of her medications to maintain blood pressure in the normal range.  We did also discussed that elevated blood pressure increases her risk of recurrent stroke and heart attack.  She voiced understanding.  She reports she will continue all of her medications as directed. She reports she does not need a refill at this time. 2. Morbid obesity ([place]) Diet, increased exercise, and weight loss were discussed.  We discussed her BMI.  She understands she needs to lose weight.  She reports that she will try to work on her diet.  3. Hyperlipidemia, mixed We did discuss that her LDL goal is less than 70.  We discussed that her current LDL is not at goal.  She does understand that cholesterol medication is necessary  because she has had a history of stroke.  We discussed primary versus secondary prevention.  She agrees to continue the medication at this time and will plan on rechecking her cholesterol in 2 to 3 months.  I have placed this lab test for her to get this done prior to her office visit. - Lipid panel  4. Hypothyroidism, unspecified type Today we did discuss hypothyroidism and the need for medication.  We discussed how hypothyroidism can affect energy, mood, bowel movements, cholesterol values, and body weight.  She is agreeable to taking the medication.  She understands the risk versus benefits of this medication.  She will call if she has any problems.  We will plan on rechecking TSH in 2 months or sooner if needed. - TSH  5. High risk medication use We will check liver tests in 2 months since we will be reinitiating a statin. - Hepatic function panel  Office visit was 25 minutes.  Greater than 50% of office visit spent counseling and coordinating care.  Patient was asked that if she is going to discontinue her medicines to please call our office so that we can discuss this.  We did discuss that her medications are necessary to maintain her health and wellness.  Compliance with medication was recommended. Return in about 2 months (around 03/02/2018) for follow up. Caren Macadam, MD 12/31/2017

## 2018-01-28 ENCOUNTER — Encounter: Payer: Self-pay | Admitting: Family Medicine

## 2018-01-29 ENCOUNTER — Encounter: Payer: Self-pay | Admitting: Family Medicine

## 2018-02-01 ENCOUNTER — Telehealth: Payer: Self-pay | Admitting: Family Medicine

## 2018-02-01 NOTE — Telephone Encounter (Signed)
[name] -daughter is calling as the PT has a yeast rash under the fold  (stomach area) and its not itching, its burning, pt is schedule for oscopy procedure on wed.--[name] wanted to let you know incase you wanted to call in something.

## 2018-02-01 NOTE — Telephone Encounter (Signed)
Spoke with patient and let her know Dr.Hagler wasn't available today but I would send a message to her and she would get it tomorrow with verbal understanding. I advised her to keep the area clean and dry with verbal understanding.

## 2018-02-02 NOTE — Telephone Encounter (Signed)
Please call and advise that she can use OTC miconazole cream but she really needs to be seen to see what is going on with the rash and make sure that it is nothing else going on other than a yeast infection of the skin. Needs OV. Gwen Her. Mannie Stabile, MD

## 2018-02-03 DIAGNOSIS — K222 Esophageal obstruction: Secondary | ICD-10-CM | POA: Diagnosis not present

## 2018-02-03 DIAGNOSIS — K3189 Other diseases of stomach and duodenum: Secondary | ICD-10-CM | POA: Diagnosis not present

## 2018-02-03 DIAGNOSIS — K449 Diaphragmatic hernia without obstruction or gangrene: Secondary | ICD-10-CM | POA: Diagnosis not present

## 2018-02-03 DIAGNOSIS — R1310 Dysphagia, unspecified: Secondary | ICD-10-CM | POA: Diagnosis not present

## 2018-02-03 DIAGNOSIS — Z1211 Encounter for screening for malignant neoplasm of colon: Secondary | ICD-10-CM | POA: Diagnosis not present

## 2018-02-03 NOTE — Telephone Encounter (Signed)
Patient aware- will call back to schedule. Currently driving

## 2018-02-17 DIAGNOSIS — K222 Esophageal obstruction: Secondary | ICD-10-CM | POA: Diagnosis not present

## 2018-02-17 DIAGNOSIS — R1310 Dysphagia, unspecified: Secondary | ICD-10-CM | POA: Diagnosis not present

## 2018-02-17 DIAGNOSIS — K2980 Duodenitis without bleeding: Secondary | ICD-10-CM | POA: Diagnosis not present

## 2018-03-04 ENCOUNTER — Ambulatory Visit: Payer: Medicaid Other | Admitting: Family Medicine

## 2018-03-10 ENCOUNTER — Ambulatory Visit (INDEPENDENT_AMBULATORY_CARE_PROVIDER_SITE_OTHER): Payer: Medicare Other

## 2018-03-10 VITALS — BP 142/88 | HR 86 | Temp 98.4°F | Ht 64.0 in | Wt 249.2 lb

## 2018-03-10 DIAGNOSIS — E2839 Other primary ovarian failure: Secondary | ICD-10-CM

## 2018-03-10 DIAGNOSIS — Z0000 Encounter for general adult medical examination without abnormal findings: Secondary | ICD-10-CM | POA: Diagnosis not present

## 2018-03-10 NOTE — Patient Instructions (Signed)
Laura Jimenez , Thank you for taking time to come for your Medicare Wellness Visit. I appreciate your ongoing commitment to your health goals. Please review the following plan we discussed and let me know if I can assist you in the future.   Screening recommendations/referrals: Colonoscopy: completed 01/2018 per patient, please have report sent to our office  Mammogram: up to date, next due 08/28/2018 Bone Density: referral sent to have this scheduled.  Recommended yearly ophthalmology/optometry visit for glaucoma screening and checkup Recommended yearly dental visit for hygiene and checkup  Vaccinations: Influenza vaccine: due 03/2018 Pneumococcal vaccine: up to date Tdap vaccine: up to date, next due 09/26/2027 Shingles vaccine: Check with your pharmacy about receiving the Shingrix vaccine    Advanced directives: Advance directive discussed with you today. I have provided a copy for you to complete at home and have notarized. Once this is complete please bring a copy in to our office so we can scan it into your chart.  Conditions/risks identified:  Try to lose weight and get to around 130 lbs.   Next appointment: schedule next AWV in 1 year    Preventive Care 65 Years and Older, Female Preventive care refers to lifestyle choices and visits with your health care provider that can promote health and wellness. What does preventive care include?  A yearly physical exam. This is also called an annual well check.  Dental exams once or twice a year.  Routine eye exams. Ask your health care provider how often you should have your eyes checked.  Personal lifestyle choices, including:  Daily care of your teeth and gums.  Regular physical activity.  Eating a healthy diet.  Avoiding tobacco and drug use.  Limiting alcohol use.  Practicing safe sex.  Taking low-dose aspirin every day.  Taking vitamin and mineral supplements as recommended by your health care provider. What happens  during an annual well check? The services and screenings done by your health care provider during your annual well check will depend on your age, overall health, lifestyle risk factors, and family history of disease. Counseling  Your health care provider may ask you questions about your:  Alcohol use.  Tobacco use.  Drug use.  Emotional well-being.  Home and relationship well-being.  Sexual activity.  Eating habits.  History of falls.  Memory and ability to understand (cognition).  Work and work Statistician.  Reproductive health. Screening  You may have the following tests or measurements:  Height, weight, and BMI.  Blood pressure.  Lipid and cholesterol levels. These may be checked every 5 years, or more frequently if you are over 68 years old.  Skin check.  Lung cancer screening. You may have this screening every year starting at age 31 if you have a 30-pack-year history of smoking and currently smoke or have quit within the past 15 years.  Fecal occult blood test (FOBT) of the stool. You may have this test every year starting at age 74.  Flexible sigmoidoscopy or colonoscopy. You may have a sigmoidoscopy every 5 years or a colonoscopy every 10 years starting at age 68.  Hepatitis C blood test.  Hepatitis B blood test.  Sexually transmitted disease (STD) testing.  Diabetes screening. This is done by checking your blood sugar (glucose) after you have not eaten for a while (fasting). You may have this done every 1-3 years.  Bone density scan. This is done to screen for osteoporosis. You may have this done starting at age 68.  Mammogram. This may  be done every 1-2 years. Talk to your health care provider about how often you should have regular mammograms. Talk with your health care provider about your test results, treatment options, and if necessary, the need for more tests. Vaccines  Your health care provider may recommend certain vaccines, such  as:  Influenza vaccine. This is recommended every year.  Tetanus, diphtheria, and acellular pertussis (Tdap, Td) vaccine. You may need a Td booster every 10 years.  Zoster vaccine. You may need this after age 26.  Pneumococcal 13-valent conjugate (PCV13) vaccine. One dose is recommended after age 2.  Pneumococcal polysaccharide (PPSV23) vaccine. One dose is recommended after age 60. Talk to your health care provider about which screenings and vaccines you need and how often you need them. This information is not intended to replace advice given to you by your health care provider. Make sure you discuss any questions you have with your health care provider. Document Released: 09/07/2015 Document Revised: 04/30/2016 Document Reviewed: 06/12/2015 Elsevier Interactive Patient Education  2017 [place] Prevention in the Home Falls can cause injuries. They can happen to people of all ages. There are many things you can do to make your home safe and to help prevent falls. What can I do on the outside of my home?  Regularly fix the edges of walkways and driveways and fix any cracks.  Remove anything that might make you trip as you walk through a door, such as a raised step or threshold.  Trim any bushes or trees on the path to your home.  Use bright outdoor lighting.  Clear any walking paths of anything that might make someone trip, such as rocks or tools.  Regularly check to see if handrails are loose or broken. Make sure that both sides of any steps have handrails.  Any raised decks and porches should have guardrails on the edges.  Have any leaves, snow, or ice cleared regularly.  Use sand or salt on walking paths during winter.  Clean up any spills in your garage right away. This includes oil or grease spills. What can I do in the bathroom?  Use night lights.  Install grab bars by the toilet and in the tub and shower. Do not use towel bars as grab bars.  Use  non-skid mats or decals in the tub or shower.  If you need to sit down in the shower, use a plastic, non-slip stool.  Keep the floor dry. Clean up any water that spills on the floor as soon as it happens.  Remove soap buildup in the tub or shower regularly.  Attach bath mats securely with double-sided non-slip rug tape.  Do not have throw rugs and other things on the floor that can make you trip. What can I do in the bedroom?  Use night lights.  Make sure that you have a light by your bed that is easy to reach.  Do not use any sheets or blankets that are too big for your bed. They should not hang down onto the floor.  Have a firm chair that has side arms. You can use this for support while you get dressed.  Do not have throw rugs and other things on the floor that can make you trip. What can I do in the kitchen?  Clean up any spills right away.  Avoid walking on wet floors.  Keep items that you use a lot in easy-to-reach places.  If you need to reach something above you,  use a strong step stool that has a grab bar.  Keep electrical cords out of the way.  Do not use floor polish or wax that makes floors slippery. If you must use wax, use non-skid floor wax.  Do not have throw rugs and other things on the floor that can make you trip. What can I do with my stairs?  Do not leave any items on the stairs.  Make sure that there are handrails on both sides of the stairs and use them. Fix handrails that are broken or loose. Make sure that handrails are as long as the stairways.  Check any carpeting to make sure that it is firmly attached to the stairs. Fix any carpet that is loose or worn.  Avoid having throw rugs at the top or bottom of the stairs. If you do have throw rugs, attach them to the floor with carpet tape.  Make sure that you have a light switch at the top of the stairs and the bottom of the stairs. If you do not have them, ask someone to add them for you. What  else can I do to help prevent falls?  Wear shoes that:  Do not have high heels.  Have rubber bottoms.  Are comfortable and fit you well.  Are closed at the toe. Do not wear sandals.  If you use a stepladder:  Make sure that it is fully opened. Do not climb a closed stepladder.  Make sure that both sides of the stepladder are locked into place.  Ask someone to hold it for you, if possible.  Clearly mark and make sure that you can see:  Any grab bars or handrails.  First and last steps.  Where the edge of each step is.  Use tools that help you move around (mobility aids) if they are needed. These include:  Canes.  Walkers.  Scooters.  Crutches.  Turn on the lights when you go into a dark area. Replace any light bulbs as soon as they burn out.  Set up your furniture so you have a clear path. Avoid moving your furniture around.  If any of your floors are uneven, fix them.  If there are any pets around you, be aware of where they are.  Review your medicines with your doctor. Some medicines can make you feel dizzy. This can increase your chance of falling. Ask your doctor what other things that you can do to help prevent falls. This information is not intended to replace advice given to you by your health care provider. Make sure you discuss any questions you have with your health care provider. Document Released: 06/07/2009 Document Revised: 01/17/2016 Document Reviewed: 09/15/2014 Elsevier Interactive Patient Education  2017 [employer].

## 2018-03-10 NOTE — Progress Notes (Signed)
Subjective:   Laura Jimenez is a 67 y.o. female who presents for Medicare Annual (Subsequent) preventive examination.  Review of Systems:  N/A Cardiac Risk Factors include: advanced age (>58men, >83 women);obesity (BMI >30kg/m2);dyslipidemia;hypertension     Objective:     Vitals: BP (!) 142/88   Pulse 86   Temp 98.4 F (36.9 C) (Oral)   Ht 5\' 4"  (1.626 m)   Wt 249 lb 4 oz (113.1 kg)   BMI 42.78 kg/m   Body mass index is 42.78 kg/m.  Advanced Directives 03/10/2018 08/15/2014 04/13/2013  Does Patient Have a Medical Advance Directive? No No Patient does not have advance directive  Would patient like information on creating a medical advance directive? Yes (MAU/Ambulatory/Procedural Areas - Information given) No - patient declined information -    Tobacco Social History   Tobacco Use  Smoking Status Former Smoker  . Packs/day: 1.00  . Years: 5.00  . Pack years: 5.00  . Types: Cigarettes  Smokeless Tobacco Never Used     Counseling given: Not Answered   Clinical Intake:  Pre-visit preparation completed: Yes  Pain : No/denies pain     Nutritional Status: BMI > 30  Obese Nutritional Risks: None Diabetes: No  How often do you need to have someone help you when you read instructions, pamphlets, or other written materials from your doctor or pharmacy?: 1 - Never What is the last grade level you completed in school?: 10th grade  Interpreter Needed?: No  Information entered by :: Andrez Grime, LPN  Past Medical History:  Diagnosis Date  . Cancer ([place])   . CVA (cerebral infarction)   . HIV infection ([place])   . HTN (hypertension)   . Hyperlipidemia   . Morbid obesity with BMI of 40.0-44.9, adult ([place])   . Postmenopausal bleeding   . Stroke [facility])    no residual paralysis   Past Surgical History:  Procedure Laterality Date  . ABDOMINAL HYSTERECTOMY    . cervical cancer    . COLONOSCOPY  2000  . HYSTEROSCOPY W/D&C N/A 04/19/2013   Procedure:  DILATATION AND CURETTAGE /HYSTEROSCOPY;  Surgeon: Emily Filbert, MD;  Location: [facility];  Service: Gynecology;  Laterality: N/A;   Family History  Problem Relation Age of Onset  . Hypertension Mother   . Hypertension Sister   . Arthritis Sister   . Cancer Other   . Diabetes Other    Social History   Socioeconomic History  . Marital status: Widowed    Spouse name: Not on file  . Number of children: Not on file  . Years of education: Not on file  . Highest education level: 10th grade  Occupational History  . Not on file  Social Needs  . Financial resource strain: Not hard at all  . Food insecurity:    Worry: Never true    Inability: Never true  . Transportation needs:    Medical: No    Non-medical: No  Tobacco Use  . Smoking status: Former Smoker    Packs/day: 1.00    Years: 5.00    Pack years: 5.00    Types: Cigarettes  . Smokeless tobacco: Never Used  Substance and Sexual Activity  . Alcohol use: No  . Drug use: No  . Sexual activity: Not Currently    Birth control/protection: None, Post-menopausal  Lifestyle  . Physical activity:    Days per week: 0 days    Minutes per session: 0 min  . Stress: Not at all  Relationships  . Social connections:    Talks on phone: More than three times a week    Gets together: More than three times a week    Attends religious service: More than 4 times per year    Active member of club or organization: Yes    Attends meetings of clubs or organizations: 1 to 4 times per year    Relationship status: Widowed  Other Topics Concern  . Not on file  Social History Narrative   Grew up in [place], [place].   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   2 children, boy and girl. Son is 51s, and special needs.   Daughter in her 4s, is a Marine scientist.   Eats all food groups.   Wears seatbelt.   Attends church.   Enjoys watching tv, listening to [employer], and spending time with friends.   Enjoys going out to eat.     Outpatient  Encounter Medications as of 03/10/2018  Medication Sig  . aspirin (PX ENTERIC ASPIRIN) 81 MG EC tablet Take 1 tablet (81 mg total) by mouth daily. Swallow whole.  Marland Kitchen atorvastatin (LIPITOR) 20 MG tablet Take 1 tablet (20 mg total) by mouth daily.  . bictegravir-emtricitabine-tenofovir AF (BIKTARVY) 50-200-25 MG TABS tablet Take by mouth.  Marland Kitchen Bioflavonoid Products (VITAMIN C) CHEW Chew 1 tablet by mouth daily.  . cloNIDine (CATAPRES - DOSED IN MG/24 HR) 0.3 mg/24hr patch Place 1 patch (0.3 mg total) onto the skin once a week.  . darunavir (PREZISTA) 600 MG tablet Take 600 mg by mouth 2 (two) times daily with a meal.  . doravirine (PIFELTRO) 100 MG TABS tablet Take by mouth.  . levothyroxine (SYNTHROID, LEVOTHROID) 50 MCG tablet Take 1 tablet (50 mcg total) by mouth daily.  Marland Kitchen lisinopril (PRINIVIL,ZESTRIL) 40 MG tablet Take 1 tablet (40 mg total) by mouth daily.  . ranitidine (ZANTAC) 300 MG tablet Take by mouth.  . ritonavir (NORVIR) 100 MG TABS Take by mouth 2 (two) times daily with a meal.  . triamterene-hydrochlorothiazide (MAXZIDE-25) 37.5-25 MG tablet Take 1 tablet by mouth daily.  . vitamin B-12 (CYANOCOBALAMIN) 1000 MCG tablet Take 1 tablet (1,000 mcg total) by mouth daily.   No facility-administered encounter medications on file as of 03/10/2018.     Activities of Daily Living In your present state of health, do you have any difficulty performing the following activities: 03/10/2018  Hearing? N  Vision? N  Difficulty concentrating or making decisions? Y  Comment sometimes  Walking or climbing stairs? Y  Comment Patient has leg pain when walking sometimes  Dressing or bathing? N  Doing errands, shopping? N  Preparing Food and eating ? N  Using the Toilet? N  In the past six months, have you accidently leaked urine? Y  Comment some urine leakage at times  Do you have problems with loss of bowel control? N  Managing your Medications? N  Managing your Finances? N  Housekeeping or  managing your Housekeeping? N  Some recent data might be hidden    Patient Care Team: Caren Macadam, MD as PCP - General (Family Medicine)    Assessment:   This is a routine wellness examination for Laiylah.  Exercise Activities and Dietary recommendations Current Exercise Habits: The patient does not participate in regular exercise at present, Exercise limited by: None identified  Goals    . Weight (lb) < 130 lb (59 kg)     Patient wants to try to lose weight and  get to around 130 lbs.        Fall Risk Fall Risk  03/10/2018 11/26/2017 08/27/2017 08/11/2017  Falls in the past year? No No No No   Is the patient's home free of loose throw rugs in walkways, pet beds, electrical cords, etc?   yes      Grab bars in the bathroom? yes      Handrails on the stairs?   yes      Adequate lighting?   yes  Timed Get Up and Go performed: passed, completed within 30 seconds  Depression Screen PHQ 2/9 Scores 03/10/2018 11/26/2017 08/27/2017 08/11/2017  PHQ - 2 Score 3 0 0 0  PHQ- 9 Score 4 - - -     Cognitive Function     6CIT Screen 03/10/2018  What Year? 0 points  What month? 0 points  What time? 0 points  Count back from 20 0 points  Months in reverse 0 points  Repeat phrase 0 points  Total Score 0    Immunization History  Administered Date(s) Administered  . Influenza Whole 05/26/2005  . Influenza, Quadrivalent, Recombinant, Inj, Pf 09/03/2016  . Influenza-Unspecified 06/08/2010, 05/26/2012, 05/31/2013, 05/29/2014, 05/23/2015, 05/21/2017  . PPD Test 06/08/2009  . Pneumococcal Conjugate-13 03/20/2014  . Pneumococcal Polysaccharide-23 05/31/2013  . Tdap 03/20/2014, 09/25/2017    Qualifies for Shingles Vaccine?Advised patient to check with pharmacy about this vaccine  Screening Tests Health Maintenance  Topic Date Due  . COLONOSCOPY  01/25/2003  . PAP SMEAR  12/12/2017  . DEXA SCAN  01/24/2018  . INFLUENZA VACCINE  03/25/2018  . PNA vac Low Risk Adult (2 of 2 - PPSV23)  05/31/2018  . MAMMOGRAM  08/29/2019  . TETANUS/TDAP  09/26/2027  . Hepatitis C Screening  Completed  . HIV Screening  Completed    Cancer Screenings: Lung: Low Dose CT Chest recommended if Age 85-80 years, 30 pack-year currently smoking OR have quit w/in 15years. Patient does not qualify. Breast:  Up to date on Mammogram? Yes   Up to date of Bone Density/Dexa? No, ordered in the system  Colorectal: completed 01/2018 at [facility] per patient   Additional Screenings: : Hepatitis C Screening: completed 10/19/2006     Plan:      I have personally reviewed and noted the following in the patient's chart:   . Medical and social history . Use of alcohol, tobacco or illicit drugs  . Current medications and supplements . Functional ability and status . Nutritional status . Physical activity . Advanced directives . List of other physicians . Hospitalizations, surgeries, and ER visits in previous 12 months . Vitals . Screenings to include cognitive, depression, and falls . Referrals and appointments  In addition, I have reviewed and discussed with patient certain preventive protocols, quality metrics, and best practice recommendations. A written personalized care plan for preventive services as well as general preventive health recommendations were provided to patient.     Andrez Grime, LPN  12/25/7739

## 2018-03-28 ENCOUNTER — Ambulatory Visit (INDEPENDENT_AMBULATORY_CARE_PROVIDER_SITE_OTHER): Payer: Medicare Other

## 2018-03-28 ENCOUNTER — Ambulatory Visit (HOSPITAL_COMMUNITY)
Admission: EM | Admit: 2018-03-28 | Discharge: 2018-03-28 | Disposition: A | Discharge status: Home / Self Care | Payer: Medicare Other | Attending: Family Medicine | Admitting: Family Medicine

## 2018-03-28 ENCOUNTER — Encounter (HOSPITAL_COMMUNITY): Payer: Self-pay

## 2018-03-28 DIAGNOSIS — M25532 Pain in left wrist: Secondary | ICD-10-CM

## 2018-03-28 DIAGNOSIS — S6992XA Unspecified injury of left wrist, hand and finger(s), initial encounter: Secondary | ICD-10-CM | POA: Diagnosis not present

## 2018-03-28 DIAGNOSIS — S63502A Unspecified sprain of left wrist, initial encounter: Secondary | ICD-10-CM

## 2018-03-28 MED ORDER — NAPROXEN 375 MG PO TABS: 375.0000 mg | ORAL_TABLET | Freq: Two times a day (BID) | ORAL | 0 refills | Status: DC | PRN

## 2018-03-28 NOTE — ED Triage Notes (Signed)
Pt presents with complaints of pain in her left wrist. Denies any injury.

## 2018-03-28 NOTE — Discharge Instructions (Signed)
Wrist brace given Continue conservative management of rest, ice, compression and elevation Take naproxen as needed for pain relief (may cause abdominal discomfort, ulcers, and GI bleeds avoid taking with other NSAIDs) Follow up with orthopedist or PCP for reevaluation and management if symptoms persists Return or go to the ER if you have any new or worsening symptoms (fever, chills, chest pain, abdominal pain, changes in bowel or bladder habits, pain radiating into lower legs, etc...)

## 2018-03-28 NOTE — ED Provider Notes (Signed)
[facility]   [id number] 03/28/18 Arrival Time: 1013  SUBJECTIVE: History from: patient. Laura Jimenez is a 67 y.o. female complains of left wrist pain that began last night.  Denies a precipitating event or specific injury, but symptoms began after performing yard work yesterday.  Patient admits to moving a heavy picnic table, using a push mower, as well as weed eating.  Localizes the pain to the posterior wrist.  Describes the pain as constant and throbbing, achy, and sharp in character.  Has tried OTC medications like tylenol as well as icing, and elevation without relief.  Symptoms are made worse with ROM.  Denies similar symptoms in the past.  Complains of swelling.  Denies fever, chills, erythema, ecchymosis, weakness, numbness and tingling.     ROS: As per HPI.  Past Medical History:  Diagnosis Date  . Cancer ([place])   . CVA (cerebral infarction)   . HIV infection ([place])   . HTN (hypertension)   . Hyperlipidemia   . Morbid obesity with BMI of 40.0-44.9, adult ([place])   . Postmenopausal bleeding   . Stroke [facility])    no residual paralysis   Past Surgical History:  Procedure Laterality Date  . ABDOMINAL HYSTERECTOMY    . cervical cancer    . COLONOSCOPY  2000  . HYSTEROSCOPY W/D&C N/A 04/19/2013   Procedure: DILATATION AND CURETTAGE /HYSTEROSCOPY;  Surgeon: Emily Filbert, MD;  Location: [facility];  Service: Gynecology;  Laterality: N/A;   Allergies  Allergen Reactions  . Ivp Dye [Iodinated Diagnostic Agents] Other (See Comments)    Caused kidney damage  . Tape Dermatitis  . Cephalexin Rash  . Latex Rash   No current facility-administered medications on file prior to encounter.    Current Outpatient Medications on File Prior to Encounter  Medication Sig Dispense Refill  . aspirin (PX ENTERIC ASPIRIN) 81 MG EC tablet Take 1 tablet (81 mg total) by mouth daily. Swallow whole. 30 tablet 12  . atorvastatin (LIPITOR) 20 MG tablet Take 1 tablet (20 mg total) by mouth daily.  90 tablet 3  . bictegravir-emtricitabine-tenofovir AF (BIKTARVY) 50-200-25 MG TABS tablet Take by mouth.    Marland Kitchen Bioflavonoid Products (VITAMIN C) CHEW Chew 1 tablet by mouth daily.    . cloNIDine (CATAPRES - DOSED IN MG/24 HR) 0.3 mg/24hr patch Place 1 patch (0.3 mg total) onto the skin once a week. 4 patch 12  . darunavir (PREZISTA) 600 MG tablet Take 600 mg by mouth 2 (two) times daily with a meal.    . doravirine (PIFELTRO) 100 MG TABS tablet Take by mouth.    . levothyroxine (SYNTHROID, LEVOTHROID) 50 MCG tablet Take 1 tablet (50 mcg total) by mouth daily. 90 tablet 3  . lisinopril (PRINIVIL,ZESTRIL) 40 MG tablet Take 1 tablet (40 mg total) by mouth daily. 90 tablet 3  . ranitidine (ZANTAC) 300 MG tablet Take by mouth.    . vitamin B-12 (CYANOCOBALAMIN) 1000 MCG tablet Take 1 tablet (1,000 mcg total) by mouth daily. 90 tablet 1  . ritonavir (NORVIR) 100 MG TABS Take by mouth 2 (two) times daily with a meal.    . triamterene-hydrochlorothiazide (MAXZIDE-25) 37.5-25 MG tablet Take 1 tablet by mouth daily. 90 tablet 3   Social History   Socioeconomic History  . Marital status: Widowed    Spouse name: Not on file  . Number of children: Not on file  . Years of education: Not on file  . Highest education level: 10th grade  Occupational History  .  Not on file  Social Needs  . Financial resource strain: Not hard at all  . Food insecurity:    Worry: Never true    Inability: Never true  . Transportation needs:    Medical: No    Non-medical: No  Tobacco Use  . Smoking status: Former Smoker    Packs/day: 1.00    Years: 5.00    Pack years: 5.00    Types: Cigarettes  . Smokeless tobacco: Never Used  Substance and Sexual Activity  . Alcohol use: No  . Drug use: No  . Sexual activity: Not Currently    Birth control/protection: None, Post-menopausal  Lifestyle  . Physical activity:    Days per week: 0 days    Minutes per session: 0 min  . Stress: Not at all  Relationships  . Social  connections:    Talks on phone: More than three times a week    Gets together: More than three times a week    Attends religious service: More than 4 times per year    Active member of club or organization: Yes    Attends meetings of clubs or organizations: 1 to 4 times per year    Relationship status: Widowed  . Intimate partner violence:    Fear of current or ex partner: No    Emotionally abused: No    Physically abused: No    Forced sexual activity: No  Other Topics Concern  . Not on file  Social History Narrative   Grew up in [place], [place].   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   2 children, boy and girl. Son is 88s, and special needs.   Daughter in her 50s, is a Marine scientist.   Eats all food groups.   Wears seatbelt.   Attends church.   Enjoys watching tv, listening to [employer], and spending time with friends.   Enjoys going out to eat.    Family History  Problem Relation Age of Onset  . Hypertension Mother   . Hypertension Sister   . Arthritis Sister   . Cancer Other   . Diabetes Other     OBJECTIVE:  Vitals:   03/28/18 1057  BP: (!) 177/85  Pulse: 72  Resp: 18  Temp: 98.6 F (37 C)  SpO2: 100%    General appearance: AOx3; in no acute distress.  Head: NCAT Lungs: CTA bilaterally Heart: RRR.  Clear S1 and S2 without murmur, gallops, or rubs.  Radial pulses 2+ bilaterally. Musculoskeletal: Left wrist Inspection: Skin warm, dry, clear and intact without obvious erythema, or ecchymosis. Mild swelling about the posterior wrist Palpation: diffusely tender about the carpals and distal ulnar ROM: LROM about the wrist; moves fingers without difficulty Strength: 5/5 shld abduction, 5/5 shld adduction, 5/5 elbow flexion, 5/5 elbow extension, 3+/5 grip strength,  Skin: warm and dry Neurologic: Ambulates without difficulty; Sensation intact about the upper extremities Psychological: alert and cooperative; normal mood and affect  DIAGNOSTIC STUDIES:    CLINICAL DATA: Wrist pain and limited range of motion after lifting injury.  EXAM: LEFT WRIST - COMPLETE 3+ VIEW  COMPARISON: None.  FINDINGS: The mineralization and alignment are normal. There is no evidence of acute fracture or dislocation. The joint spaces appear preserved. No focal soft tissue swelling identified.  IMPRESSION: No evidence of acute fracture or dislocation.  Electronically Signed By: Richardean Sale M.D. On: 03/28/2018 11:50  X-rays negative for bony abnormalities including fracture, or dislocation.  I have reviewed the x-rays  myself and the radiologist interpretation. I am in agreement with the radiologist interpretation.    ASSESSMENT & PLAN:  1. Left wrist pain   2. Sprain of left wrist, initial encounter     Meds ordered this encounter  Medications  . naproxen (NAPROSYN) 375 MG tablet    Sig: Take 1 tablet (375 mg total) by mouth 2 (two) times daily as needed for moderate pain.    Dispense:  15 tablet    Refill:  0    Order Specific Question:   Supervising Provider    Answer:   Wynona Luna [[id number]]   Wrist brace given Continue conservative management of rest, ice, compression and elevation Take naproxen as needed for pain relief (may cause abdominal discomfort, ulcers, and GI bleeds avoid taking with other NSAIDs) Follow up with orthopedist for reevaluation and management of symptoms Return or go to the ER if you have any new or worsening symptoms (fever, chills, chest pain, abdominal pain, changes in bowel or bladder habits, pain radiating into lower legs, etc...)   Reviewed expectations re: course of current medical issues. Questions answered. Outlined signs and symptoms indicating need for more acute intervention. Patient verbalized understanding. After Visit Summary given.    Lestine Box, PA-C 03/28/18 1245

## 2018-04-09 ENCOUNTER — Ambulatory Visit (INDEPENDENT_AMBULATORY_CARE_PROVIDER_SITE_OTHER): Payer: Medicare Other | Admitting: Family Medicine

## 2018-04-09 ENCOUNTER — Encounter: Payer: Self-pay | Admitting: Family Medicine

## 2018-04-09 VITALS — BP 124/82 | HR 86 | Resp 16 | Ht 64.0 in | Wt 251.0 lb

## 2018-04-09 DIAGNOSIS — M25532 Pain in left wrist: Secondary | ICD-10-CM

## 2018-04-09 DIAGNOSIS — I10 Essential (primary) hypertension: Secondary | ICD-10-CM

## 2018-04-09 DIAGNOSIS — E782 Mixed hyperlipidemia: Secondary | ICD-10-CM | POA: Diagnosis not present

## 2018-04-09 DIAGNOSIS — E785 Hyperlipidemia, unspecified: Secondary | ICD-10-CM | POA: Diagnosis not present

## 2018-04-09 DIAGNOSIS — E039 Hypothyroidism, unspecified: Secondary | ICD-10-CM | POA: Diagnosis not present

## 2018-04-09 DIAGNOSIS — Z79899 Other long term (current) drug therapy: Secondary | ICD-10-CM | POA: Diagnosis not present

## 2018-04-09 DIAGNOSIS — G8929 Other chronic pain: Secondary | ICD-10-CM | POA: Diagnosis not present

## 2018-04-09 MED ORDER — ATORVASTATIN CALCIUM 20 MG PO TABS: 10.0000 mg | ORAL_TABLET | Freq: Every day | ORAL | 3 refills | Status: DC

## 2018-04-09 NOTE — Progress Notes (Signed)
Patient ID: Laura Jimenez, female    DOB: Dec 04, 1950, 67 y.o.   MRN: [id number]  Chief Complaint  Patient presents with  . Hypertension    follow up visit     Allergies Ivp dye [iodinated diagnostic agents]; Tape; Cephalexin; and Latex  Subjective:   Laura Jimenez is a 67 y.o. female who presents to [facility] today.  HPI Here for follow up.  Reports that she has been taking her medications for blood pressure, cholesterol, and thyroid.  She does report that her infectious disease doctor decreased her cholesterol pill to 1/2 tablet p.o. nightly.  She reports that she has been feeling good.  She denies any chest pain, shortness of breath, swelling in her extremities.  She reports that she has lost a couple pounds and her energy is a little bit better.  She is not having any side effects with the medication.  Was seen at the [facility] for evaluation of left wrist pain. Had swelling and pain in the wrist and so went to the [facility]. They did xray and gave a brace but it is still bothering her. Would like a referral to see her orthopedic doctor, Dr. Durward Fortes. Reports that when she went to the [facility] the wrist was swollen and red. It was extremely sore. Now has been wearing the brace but still bothers her and requests referral. Bothering her on and off throughout the day and night, but it is better. Can move hand ok and does not have any numbness or tingling in her hand/wrist/fingers.   Had a colonoscopy and upper endoscopy in 01/2018. 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  Has upcoming follow-up with gastroenterology at [facility]. Does have a history of abnormal Pap smear and colposcopy many years ago.  Last Pap smear was 3 years ago and was within normal limits.  She has not had a recent Pap or pelvic exam performed.  She denies any vaginal bleeding, vaginal discharge.  She would like to come in at her next visit for a Pap smear. She did not get her lab work prior to the visit.  She  was going to get her thyroid, cholesterol, and liver test performed.  Her electrolytes and kidney function were checked at the last visit and they were within normal limits.   Past Medical History:  Diagnosis Date  . Cancer ([place])   . CVA (cerebral infarction)   . HIV infection ([place])   . HTN (hypertension)   . Hyperlipidemia   . Morbid obesity with BMI of 40.0-44.9, adult ([place])   . Postmenopausal bleeding   . Stroke [facility])    no residual paralysis    Past Surgical History:  Procedure Laterality Date  . ABDOMINAL HYSTERECTOMY    . cervical cancer    . COLONOSCOPY  2000  . HYSTEROSCOPY W/D&C N/A 04/19/2013   Procedure: DILATATION AND CURETTAGE /HYSTEROSCOPY;  Surgeon: Emily Filbert, MD;  Location: [facility];  Service: Gynecology;  Laterality: N/A;    Family History  Problem Relation Age of Onset  . Hypertension Mother   . Hypertension Sister   . Arthritis Sister   . Cancer Other   . Diabetes Other      Social History   Socioeconomic History  . Marital status: Widowed    Spouse name: Not on file  . Number of children: Not on file  . Years of education: Not on file  . Highest education level: 10th grade  Occupational History  . Not  on file  Social Needs  . Financial resource strain: Not hard at all  . Food insecurity:    Worry: Never true    Inability: Never true  . Transportation needs:    Medical: No    Non-medical: No  Tobacco Use  . Smoking status: Former Smoker    Packs/day: 1.00    Years: 5.00    Pack years: 5.00    Types: Cigarettes  . Smokeless tobacco: Never Used  Substance and Sexual Activity  . Alcohol use: No  . Drug use: No  . Sexual activity: Not Currently    Birth control/protection: None, Post-menopausal  Lifestyle  . Physical activity:    Days per week: 0 days    Minutes per session: 0 min  . Stress: Not at all  Relationships  . Social connections:    Talks on phone: More than three times a week    Gets together: More than three times a  week    Attends religious service: More than 4 times per year    Active member of club or organization: Yes    Attends meetings of clubs or organizations: 1 to 4 times per year    Relationship status: Widowed  Other Topics Concern  . Not on file  Social History Narrative   Grew up in [place], [place].   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   2 children, boy and girl. Son is 64s, and special needs.   Daughter in her 79s, is a Marine scientist.   Eats all food groups.   Wears seatbelt.   Attends church.   Enjoys watching tv, listening to [employer], and spending time with friends.   Enjoys going out to eat.    Current Outpatient Medications on File Prior to Visit  Medication Sig Dispense Refill  . aspirin (PX ENTERIC ASPIRIN) 81 MG EC tablet Take 1 tablet (81 mg total) by mouth daily. Swallow whole. 30 tablet 12  . bictegravir-emtricitabine-tenofovir AF (BIKTARVY) 50-200-25 MG TABS tablet Take by mouth.    [employer] Bioflavonoid Products (VITAMIN C) CHEW Chew 1 tablet by mouth daily.    . Cholecalciferol (VITAMIN D) 2000 units CAPS Take by mouth.    . cloNIDine (CATAPRES - DOSED IN MG/24 HR) 0.3 mg/24hr patch Place 1 patch (0.3 mg total) onto the skin once a week. 4 patch 12  . levothyroxine (SYNTHROID, LEVOTHROID) 50 MCG tablet Take 1 tablet (50 mcg total) by mouth daily. 90 tablet 3  . lisinopril (PRINIVIL,ZESTRIL) 40 MG tablet Take 1 tablet (40 mg total) by mouth daily. 90 tablet 3  . naproxen (NAPROSYN) 375 MG tablet Take 1 tablet (375 mg total) by mouth 2 (two) times daily as needed for moderate pain. 15 tablet 0  . ranitidine (ZANTAC) 300 MG tablet Take by mouth.    . triamterene-hydrochlorothiazide (MAXZIDE-25) 37.5-25 MG tablet Take 1 tablet by mouth daily. 90 tablet 3  . vitamin B-12 (CYANOCOBALAMIN) 1000 MCG tablet Take 1 tablet (1,000 mcg total) by mouth daily. 90 tablet 1  . darunavir (PREZISTA) 600 MG tablet Take 600 mg by mouth 2 (two) times daily with a meal.    . doravirine  (PIFELTRO) 100 MG TABS tablet Take by mouth.    . ritonavir (NORVIR) 100 MG TABS Take by mouth 2 (two) times daily with a meal.     No current facility-administered medications on file prior to visit.     Review of Systems  Constitutional: Negative for activity change, appetite change  and fever.  HENT: Negative for trouble swallowing and voice change.   Eyes: Negative for visual disturbance.  Respiratory: Negative for cough, chest tightness and shortness of breath.   Cardiovascular: Negative for chest pain, palpitations and leg swelling.  Gastrointestinal: Negative for abdominal pain, nausea and vomiting.  Genitourinary: Negative for dysuria, frequency and urgency.  Musculoskeletal: Negative for gait problem and myalgias.       Still some pain in her left wrist.  Skin: Negative for rash.  Neurological: Negative for dizziness, tremors, syncope, weakness, light-headedness and headaches.  Hematological: Negative for adenopathy.  Psychiatric/Behavioral: Negative for agitation, dysphoric mood and sleep disturbance. The patient is not nervous/anxious.      Objective:   BP 124/82   Pulse 86   Resp 16   Ht 5\' 4"  (1.626 m)   Wt 251 lb (113.9 kg)   SpO2 97%   BMI 43.08 kg/m   Physical Exam  Constitutional: She is oriented to person, place, and time. She appears well-developed and well-nourished. No distress.  HENT:  Head: Normocephalic and atraumatic.  Eyes: Pupils are equal, round, and reactive to light.  Neck: Normal range of motion. Neck supple. No thyromegaly present.  Cardiovascular: Normal rate, regular rhythm and normal heart sounds.  Pulmonary/Chest: Effort normal and breath sounds normal. No respiratory distress.  Musculoskeletal:       Left wrist: She exhibits no tenderness, no bony tenderness, no swelling, no effusion and no deformity.  Neuro vasculature intact in upper extremities bilaterally.    Neurological: She is alert and oriented to person, place, and time. No  cranial nerve deficit.  Skin: Skin is warm and dry.  Psychiatric: She has a normal mood and affect. Her behavior is normal. Judgment and thought content normal.  Nursing note and vitals reviewed.    Assessment and Plan  1. Essential hypertension Stable and improved.  Continue current medications. Lifestyle modifications discussed with patient including a diet emphasizing vegetables, fruits, and whole grains. Limiting intake of sodium to less than 2,400 mg per day.  Recommendations discussed include consuming low-fat dairy products, poultry, fish, legumes, non-tropical vegetable oils, and nuts; and limiting intake of sweets, sugar-sweetened beverages, and red meat. Discussed following a plan such as the Dietary Approaches to Stop Hypertension (DASH) diet. Patient to read up on this diet.    2. Wrist pain, chronic, left Patient is still wearing a left wrist brace.  Notes from the urgent care clinic were reviewed.  Referral placed for to follow-up with her orthopedic doctor. - Ambulatory referral to Orthopedic Surgery  3. Hyperlipidemia, unspecified hyperlipidemia type Hyperlipidemia and the associated risk of ASCVD were discussed today. Primary vs. Secondary prevention of ASCVD were discussed and how it relates to patient morbidity, mortality, and quality of life. Shared decision making with patient including the risks of statins vs.benefits of ASCVD risk reduction discussed.  Risks of stains discussed including myopathy, rhabdomyoloysis, liver problems, increased risk of diabetes discussed. We discussed heart healthy diet, lifestyle modifications, risk factor modifications, and adherence to the recommended treatment plan. We discussed the need to periodically monitor lipid panel and liver function tests while on statin therapy.   - atorvastatin (LIPITOR) 20 MG tablet; Take 0.5 tablets (10 mg total) by mouth daily.  Dispense: 90 tablet; Refill: 3  4. Hypothyroidism, unspecified type Check labs  and adjust thyroid medication as needed.   Return in about 4 weeks (around 05/07/2018) for CPE/pap. Caren Macadam, MD 04/09/2018

## 2018-04-10 LAB — LIPID PANEL
Cholesterol: 185 mg/dL (ref <200)
HDL: 42 mg/dL — ABNORMAL LOW (ref >50)
LDL Cholesterol (Calc): 122 mg/dL (calc) — ABNORMAL HIGH
NON-HDL CHOLESTEROL (CALC): 143 mg/dL — AB (ref <130)
TRIGLYCERIDES: 105 mg/dL (ref <150)
Total CHOL/HDL Ratio: 4.4 (calc) (ref <5.0)

## 2018-04-10 LAB — HEPATIC FUNCTION PANEL
AG RATIO: 0.6 (calc) — AB (ref 1.0–2.5)
ALKALINE PHOSPHATASE (APISO): 80 U/L (ref 33–130)
ALT: 11 U/L (ref 6–29)
AST: 18 U/L (ref 10–35)
Albumin: 3.3 g/dL — ABNORMAL LOW (ref 3.6–5.1)
BILIRUBIN INDIRECT: 0.4 mg/dL (ref 0.2–1.2)
BILIRUBIN TOTAL: 0.5 mg/dL (ref 0.2–1.2)
Bilirubin, Direct: 0.1 mg/dL (ref 0.0–0.2)
GLOBULIN: 5.7 g/dL — AB (ref 1.9–3.7)
Total Protein: 9 g/dL — ABNORMAL HIGH (ref 6.1–8.1)

## 2018-04-10 LAB — TSH: TSH: 3.51 mIU/L (ref 0.40–4.50)

## 2018-04-13 ENCOUNTER — Telehealth: Payer: Self-pay | Admitting: Family Medicine

## 2018-04-13 DIAGNOSIS — R779 Abnormality of plasma protein, unspecified: Secondary | ICD-10-CM | POA: Diagnosis not present

## 2018-04-13 NOTE — Telephone Encounter (Signed)
Spoke with patient and advised her she needs to return to the lab to have labs redrawn with verbal understanding. Patient will go to the lab today to have them drawn.

## 2018-04-13 NOTE — Telephone Encounter (Signed)
Please call and advise that she needs to go to the  lab b/c her serum proteins are elevated and it is not her albumin. I have ordered the test.

## 2018-04-15 LAB — PROTEIN ELECTROPHORESIS, SERUM
ALPHA 1: 0.2 g/dL (ref 0.2–0.3)
ALPHA 2: 0.7 g/dL (ref 0.5–0.9)
Albumin ELP: 3.4 g/dL — ABNORMAL LOW (ref 3.8–4.8)
BETA GLOBULIN: 0.4 g/dL (ref 0.4–0.6)
Beta 2: 0.4 g/dL (ref 0.2–0.5)
Gamma Globulin: 3.8 g/dL — ABNORMAL HIGH (ref 0.8–1.7)
Total Protein: 8.9 g/dL — ABNORMAL HIGH (ref 6.1–8.1)

## 2018-04-16 ENCOUNTER — Telehealth: Payer: Self-pay | Admitting: Family Medicine

## 2018-04-16 DIAGNOSIS — R779 Abnormality of plasma protein, unspecified: Secondary | ICD-10-CM

## 2018-04-16 NOTE — Telephone Encounter (Signed)
Left message requesting call back to discuss recent lab results.

## 2018-04-16 NOTE — Telephone Encounter (Signed)
Please call patient and advised that her proteins and her blood work still elevated.  I would like for her to see the blood doctor to see if there is any other recommendations or evaluation that needs to be done.  I have placed this referral.

## 2018-04-20 NOTE — Telephone Encounter (Signed)
Spoke with patient and advised her of recent lab results and Dr.Haglers treatment plan and recommendations with verbal understanding.

## 2018-04-21 ENCOUNTER — Ambulatory Visit (INDEPENDENT_AMBULATORY_CARE_PROVIDER_SITE_OTHER): Payer: Medicare Other | Admitting: Orthopaedic Surgery

## 2018-04-22 ENCOUNTER — Ambulatory Visit (HOSPITAL_COMMUNITY): Payer: Medicare Other | Admitting: Internal Medicine

## 2018-04-29 ENCOUNTER — Ambulatory Visit (HOSPITAL_COMMUNITY): Payer: Medicare Other | Admitting: Internal Medicine

## 2018-05-07 ENCOUNTER — Encounter: Payer: Self-pay | Admitting: Family Medicine

## 2018-05-07 ENCOUNTER — Ambulatory Visit (INDEPENDENT_AMBULATORY_CARE_PROVIDER_SITE_OTHER): Payer: Medicare Other | Admitting: Family Medicine

## 2018-05-07 ENCOUNTER — Other Ambulatory Visit: Payer: Self-pay

## 2018-05-07 ENCOUNTER — Other Ambulatory Visit (HOSPITAL_COMMUNITY)
Admission: RE | Admit: 2018-05-07 | Discharge: 2018-05-07 | Disposition: A | Discharge status: Home / Self Care | Payer: Medicare Other | Source: Ambulatory Visit | Attending: Family Medicine | Admitting: Family Medicine

## 2018-05-07 VITALS — BP 140/82 | HR 74 | Temp 97.8°F | Resp 12 | Ht 66.0 in | Wt 252.0 lb

## 2018-05-07 DIAGNOSIS — I10 Essential (primary) hypertension: Secondary | ICD-10-CM | POA: Diagnosis not present

## 2018-05-07 DIAGNOSIS — C519 Malignant neoplasm of vulva, unspecified: Secondary | ICD-10-CM | POA: Diagnosis present

## 2018-05-07 DIAGNOSIS — Z124 Encounter for screening for malignant neoplasm of cervix: Secondary | ICD-10-CM | POA: Diagnosis not present

## 2018-05-07 DIAGNOSIS — R779 Abnormality of plasma protein, unspecified: Secondary | ICD-10-CM

## 2018-05-07 DIAGNOSIS — F419 Anxiety disorder, unspecified: Secondary | ICD-10-CM

## 2018-05-07 DIAGNOSIS — E785 Hyperlipidemia, unspecified: Secondary | ICD-10-CM

## 2018-05-07 DIAGNOSIS — E039 Hypothyroidism, unspecified: Secondary | ICD-10-CM

## 2018-05-07 DIAGNOSIS — Z0000 Encounter for general adult medical examination without abnormal findings: Secondary | ICD-10-CM | POA: Diagnosis not present

## 2018-05-07 LAB — CBC WITH DIFFERENTIAL/PLATELET
BASOS PCT: 1 %
Basophils Absolute: 58 cells/uL (ref 0–200)
EOS ABS: 81 {cells}/uL (ref 15–500)
EOS PCT: 1.4 %
HCT: 33.3 % — ABNORMAL LOW (ref 35.0–45.0)
HEMOGLOBIN: 11 g/dL — AB (ref 11.7–15.5)
Lymphs Abs: 3509 cells/uL (ref 850–3900)
MCH: 29.8 pg (ref 27.0–33.0)
MCHC: 33 g/dL (ref 32.0–36.0)
MCV: 90.2 fL (ref 80.0–100.0)
MONOS PCT: 8.7 %
MPV: 10.5 fL (ref 7.5–12.5)
NEUTROS ABS: 1647 {cells}/uL (ref 1500–7800)
Neutrophils Relative %: 28.4 %
Platelets: 207 10*3/uL (ref 140–400)
RBC: 3.69 10*6/uL — AB (ref 3.80–5.10)
RDW: 13 % (ref 11.0–15.0)
Total Lymphocyte: 60.5 %
WBC mixed population: 505 cells/uL (ref 200–950)
WBC: 5.8 10*3/uL (ref 3.8–10.8)

## 2018-05-07 MED ORDER — TRAZODONE HCL 50 MG PO TABS: 25.0000 mg | ORAL_TABLET | Freq: Every evening | ORAL | 3 refills | Status: DC | PRN

## 2018-05-07 NOTE — Progress Notes (Signed)
Patient ID: Laura Jimenez, female    DOB: 07-30-1951, 67 y.o.   MRN: [id number]  Chief Complaint  Patient presents with  . Annual Exam    w/ pap    Allergies Ivp dye [iodinated diagnostic agents]; Tape; Cephalexin; and Latex  Subjective:   Laura Jimenez is a 67 y.o. female who presents to [facility] today.  HPI Laura Jimenez presents for complete physical examination today.  She is due for her gynecologic care.  She does report that she has had a history of vulvar cancer and was followed multiple times at the women's center in [place].  She reports that she was subsequently released but is overdue for her Pap smear.  She denies any vaginal bleeding.  Denies any vaginal discharge.  Would like to be screened for any vaginal STDs.   Reports that she has been taking her cholesterol medications and her thyroid medications.  Reports that her energy level can be a little bit low.  Is followed by infectious disease for her HIV.  Reports that she does at times feel down and sad.  She also has a responsibility of taking care of her autistic son.  Denies any suicidal or homicidal ideations.  Reports she does not want to take a medication every day.  However reports that some nights when she feels anxious and very worried she believes that it would benefit her to be able to get some rest.  She denies any alcohol or drug use. Reports thas she has not been sexually active many years.   Denies any chest pain, shortness of breath, swelling in her extremities.   Past Medical History:  Diagnosis Date  . Cancer ([place])   . CVA (cerebral infarction)   . HIV infection ([place])   . HTN (hypertension)   . Hyperlipidemia   . Morbid obesity with BMI of 40.0-44.9, adult ([place])   . Postmenopausal bleeding   . Stroke [facility])    no residual paralysis    Past Surgical History:  Procedure Laterality Date  . COLONOSCOPY  2000  . HYSTEROSCOPY W/D&C N/A 04/19/2013   Procedure: DILATATION AND  CURETTAGE /HYSTEROSCOPY;  Surgeon: Emily Filbert, MD;  Location: [facility];  Service: Gynecology;  Laterality: N/A;    Family History  Problem Relation Age of Onset  . Hypertension Mother   . Hypertension Sister   . Arthritis Sister   . Cancer Other   . Diabetes Other      Social History   Socioeconomic History  . Marital status: Widowed    Spouse name: Not on file  . Number of children: Not on file  . Years of education: Not on file  . Highest education level: 10th grade  Occupational History  . Not on file  Social Needs  . Financial resource strain: Not hard at all  . Food insecurity:    Worry: Never true    Inability: Never true  . Transportation needs:    Medical: No    Non-medical: No  Tobacco Use  . Smoking status: Former Smoker    Packs/day: 1.00    Years: 5.00    Pack years: 5.00    Types: Cigarettes  . Smokeless tobacco: Never Used  Substance and Sexual Activity  . Alcohol use: No  . Drug use: No  . Sexual activity: Not Currently    Birth control/protection: None, Post-menopausal  Lifestyle  . Physical activity:    Days per week: 0 days  Minutes per session: 0 min  . Stress: Not at all  Relationships  . Social connections:    Talks on phone: More than three times a week    Gets together: More than three times a week    Attends religious service: More than 4 times per year    Active member of club or organization: Yes    Attends meetings of clubs or organizations: 1 to 4 times per year    Relationship status: Widowed  Other Topics Concern  . Not on file  Social History Narrative   Grew up in [place], [place].   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   2 children, boy and girl. Son is 60s, and special needs.   Daughter in her 17s, is a Marine scientist.   Eats all food groups.   Wears seatbelt.   Attends church.   Enjoys watching tv, listening to [employer], and spending time with friends.   Enjoys going out to eat.    Current Outpatient  Medications on File Prior to Visit  Medication Sig Dispense Refill  . aspirin (PX ENTERIC ASPIRIN) 81 MG EC tablet Take 1 tablet (81 mg total) by mouth daily. Swallow whole. 30 tablet 12  . atorvastatin (LIPITOR) 20 MG tablet Take 0.5 tablets (10 mg total) by mouth daily. 90 tablet 3  . Bioflavonoid Products (VITAMIN C) CHEW Chew 1 tablet by mouth daily.    . Cholecalciferol (VITAMIN D) 2000 units CAPS Take by mouth.    . cloNIDine (CATAPRES - DOSED IN MG/24 HR) 0.3 mg/24hr patch Place 1 patch (0.3 mg total) onto the skin once a week. 4 patch 12  . darunavir (PREZISTA) 600 MG tablet Take 600 mg by mouth 2 (two) times daily with a meal.    . doravirine (PIFELTRO) 100 MG TABS tablet Take by mouth.    . levothyroxine (SYNTHROID, LEVOTHROID) 50 MCG tablet Take 1 tablet (50 mcg total) by mouth daily. 90 tablet 3  . lisinopril (PRINIVIL,ZESTRIL) 40 MG tablet Take 1 tablet (40 mg total) by mouth daily. 90 tablet 3  . naproxen (NAPROSYN) 375 MG tablet Take 1 tablet (375 mg total) by mouth 2 (two) times daily as needed for moderate pain. 15 tablet 0  . ranitidine (ZANTAC) 300 MG tablet Take by mouth.    . ritonavir (NORVIR) 100 MG TABS Take by mouth 2 (two) times daily with a meal.    . triamterene-hydrochlorothiazide (MAXZIDE-25) 37.5-25 MG tablet Take 1 tablet by mouth daily. 90 tablet 3  . vitamin B-12 (CYANOCOBALAMIN) 1000 MCG tablet Take 1 tablet (1,000 mcg total) by mouth daily. 90 tablet 1   No current facility-administered medications on file prior to visit.     Review of Systems  Constitutional: Negative for activity change, appetite change and fever.  HENT: Negative for congestion, dental problem, drooling, ear discharge, facial swelling, hearing loss, nosebleeds, postnasal drip, rhinorrhea and sinus pain.   Eyes: Negative for visual disturbance.  Respiratory: Negative for cough, chest tightness and shortness of breath.   Cardiovascular: Negative for chest pain, palpitations and leg  swelling.  Gastrointestinal: Negative for abdominal pain, anal bleeding, blood in stool, constipation, nausea and vomiting.  Genitourinary: Negative for dyspareunia, dysuria, frequency, genital sores, hematuria, menstrual problem, urgency, vaginal bleeding, vaginal discharge and vaginal pain.  Musculoskeletal: Negative for back pain and joint swelling.  Skin: Negative for rash.  Neurological: Negative for dizziness, tremors, syncope, weakness, light-headedness and numbness.  Hematological: Negative for adenopathy. Does not bruise/bleed  easily.  Psychiatric/Behavioral: Negative for decreased concentration, dysphoric mood and sleep disturbance. The patient is not nervous/anxious and is not hyperactive.      Objective:   BP 140/82 (BP Location: Right Arm, Patient Position: Sitting, Cuff Size: Large)   Pulse 74   Temp 97.8 F (36.6 C) (Temporal)   Resp 12   Ht 5\' 6"  (1.676 m)   Wt 252 lb (114.3 kg)   SpO2 98% Comment: room air  BMI 40.67 kg/m   Physical Exam  Constitutional: She is oriented to person, place, and time. She appears well-developed and well-nourished. No distress.  HENT:  Head: Normocephalic and atraumatic.  Nose: Nose normal.  Mouth/Throat: Oropharynx is clear and moist.  Eyes: Pupils are equal, round, and reactive to light. Conjunctivae are normal.  Neck: Normal range of motion. Neck supple. No JVD present. No tracheal deviation present. No thyromegaly present.  Cardiovascular: Normal rate, regular rhythm and normal heart sounds.  No murmur heard. Pulmonary/Chest: Effort normal and breath sounds normal. She has no wheezes. Right breast exhibits no inverted nipple, no mass, no nipple discharge, no skin change and no tenderness. Left breast exhibits no inverted nipple, no mass, no nipple discharge, no skin change and no tenderness.  Abdominal: Soft. Bowel sounds are normal. She exhibits no distension. There is no tenderness.  Genitourinary: Uterus normal. There is no  rash, tenderness or lesion on the right labia. There is no rash or tenderness on the left labia. Cervix exhibits no motion tenderness and no friability. Right adnexum displays no mass, no tenderness and no fullness. No erythema, tenderness or bleeding in the vagina. No foreign body in the vagina. No signs of injury around the vagina. No vaginal discharge found.  Musculoskeletal: Normal range of motion.  Neurological: She is alert and oriented to person, place, and time. No cranial nerve deficit.  Skin: Skin is warm and dry.  Psychiatric: She has a normal mood and affect. Her behavior is normal. Judgment and thought content normal.  Nursing note and vitals reviewed.  Depression screen [facility] 2/9 05/07/2018 03/10/2018 11/26/2017 08/27/2017 08/11/2017  Decreased Interest 1 3 0 0 0  Down, Depressed, Hopeless 1 0 0 0 0  PHQ - 2 Score 2 3 0 0 0  Altered sleeping 1 0 - - -  Tired, decreased energy 1 1 - - -  Change in appetite 0 0 - - -  Feeling bad or failure about yourself  0 0 - - -  Trouble concentrating 0 0 - - -  Moving slowly or fidgety/restless 0 0 - - -  Suicidal thoughts 0 0 - - -  PHQ-9 Score 4 4 - - -  Difficult doing work/chores - Somewhat difficult - - -    Assessment and Plan  1. Well adult exam Age-appropriate anticipatory guidance was given and discussed with patient. Diet, exercise, and weight loss modifications recommended. Keep routine health maintenance screening as directed.  2. Screening for cervical cancer Performed today. - Cytology - PAP  3. Essential hypertension Stable.  Continue current medications. Lifestyle modifications discussed with patient including a diet emphasizing vegetables, fruits, and whole grains. Limiting intake of sodium to less than 2,400 mg per day.  Recommendations discussed include consuming low-fat dairy products, poultry, fish, legumes, non-tropical vegetable oils, and nuts; and limiting intake of sweets, sugar-sweetened beverages, and red meat.  Discussed following a plan such as the Dietary Approaches to Stop Hypertension (DASH) diet. Patient to read up on this diet.    4.  Acquired hypothyroidism Stable.  Labs checked recently.  Continue current thyroid medication dosing.  5. Vulvar cancer ([place]) Counseled patient that if he has any bleeding, sores, or any issues then she does need to have checked out..  Will wait for Pap smear results. - Cytology - PAP  6. Hyperlipidemia, unspecified hyperlipidemia type Stable.  Continue current medications.  LDL is not to goal.  However patient would like to make dietary improvements prior to increasing dosage.  7. Anxiety Discussed use of trazodone or as needed sleep.  Believe that patient would recommend daily SSRI but she defers this.  We did discuss trazodone and how it works to help with anxiety and sleep. Suicide risks evaluated and documented in note if present or in the area below.  Patient does not have/denies the following risks: previous suicide attempts, family history of suicide, access to lethal means, prior history of psychiatric disorder, history of alcohol or substance abuse disorder, recent loss of a loved one, or severe hopelessness. Patient denies access to firearms or if present will have removed from home/access.   Patient has protective factors of family and community support.  Patient reports that family believes is behaving rationally. Patient displays problem solving skills.   Patient specifically denies suicide ideation. Patient has access/information to healthcare contacts if situation or mood changes where patient is a risk to self or others or mood becomes unstable.   During the encounter, the patient had good eye contact and firm handshake regarding safety contract and agreement to seek help if mood worsens and not to harm self.   Patient understands the treatment plan and is in agreement. Agrees to keep follow up and call prior or return to clinic if needed.   -  traZODone (DESYREL) 50 MG tablet; Take 0.5-1 tablets (25-50 mg total) by mouth at bedtime as needed for sleep.  Dispense: 30 tablet; Refill: 3  8. Elevated serum protein level Patient was referred to oncology/hematology secondary to elevated serum protein levels.  She missed her appointment with them on September 4.  She reports that she spoke with him yesterday and plans on scheduling this.  She understands the significance of having this checked.  Patient will follow-up and to establish care with new PCP in the next 1 to 2 months. She defers a flu shot today and reports she will get this at [facility]. Health maintenance was discussed.  Yearly mammogram recommended.  Self breast exam discussed. Questions were answered today. Keep scheduled visits with infectious disease. No follow-ups on file. Caren Macadam, MD 05/07/2018

## 2018-05-12 LAB — CYTOLOGY - PAP
Diagnosis: NEGATIVE
HPV (WINDOPATH): DETECTED — AB

## 2018-05-13 ENCOUNTER — Ambulatory Visit (INDEPENDENT_AMBULATORY_CARE_PROVIDER_SITE_OTHER): Payer: Medicare Other | Admitting: Orthopaedic Surgery

## 2018-05-13 ENCOUNTER — Encounter (INDEPENDENT_AMBULATORY_CARE_PROVIDER_SITE_OTHER): Payer: Self-pay | Admitting: Orthopaedic Surgery

## 2018-05-13 VITALS — BP 164/100 | HR 70 | Ht 66.0 in | Wt 249.0 lb

## 2018-05-13 DIAGNOSIS — M778 Other enthesopathies, not elsewhere classified: Secondary | ICD-10-CM

## 2018-05-13 MED ORDER — NAPROXEN 500 MG PO TABS: 500.0000 mg | ORAL_TABLET | Freq: Two times a day (BID) | ORAL | 1 refills | Status: DC

## 2018-05-13 NOTE — Progress Notes (Signed)
Office Visit Note   Patient: Laura Jimenez           Date of Birth: 1951/01/27           MRN: [id number] Visit Date: 05/13/2018              Requested by: Caren Macadam, [address], [place] [address] PCP: Caren Macadam, MD   Assessment & Plan: Visit Diagnoses:  1. Left wrist tendinitis     Plan: Patient has some mild synovitis of the wrist with tendinitis likely related to yard work.  She can continue the splint intermittently refill given the Naprosyn.  She can use intermittent ice gradual strengthening exercises which we went over.  She needs to take her blood pressure medication which she has not taken today and office follow-up if she has persistent symptoms.  Follow-Up Instructions: Return if symptoms worsen or fail to improve.   Orders:  No orders of the defined types were placed in this encounter.  Meds ordered this encounter  Medications  . naproxen (NAPROSYN) 500 MG tablet    Sig: Take 1 tablet (500 mg total) by mouth 2 (two) times daily with a meal.    Dispense:  60 tablet    Refill:  1      Procedures: No procedures performed   Clinical Data: No additional findings.   Subjective: Chief Complaint  Patient presents with  . Left Wrist - Pain    HPI 67 year old female with left wrist pain that is been present for more than a month.  She is doing yard work a few weeks ago had increased pain and swelling in her left nondominant wrist.  She gets some relief with Naprosyn and requested a refill.  She is used a wrist splint which is been helpful.  The pain is been intermittent and occasionally sharp she points over the radiocarpal joint where she experienced it.  States is not a constant pain she did not have any specific falling.  Blood pressure elevated today 164/100 she has not taken her medication yet today.  Review of Systems as for HIV, candidiasis, peripheral neuropathy, esophagitis, ovarian cyst, folliculitis, will work cancer,  cutaneous lupus, Obesity hyperlipidemia hypothyroidism otherwise negative is a pertains HPI 14 point review of systems updated.  Objective: Vital Signs: BP (!) 164/100   Pulse 70   Ht 5\' 6"  (1.676 m)   Wt 249 lb (112.9 kg)   BMI 40.19 kg/m   Physical Exam  Constitutional: She is oriented to person, place, and time. She appears well-developed.  HENT:  Head: Normocephalic.  Right Ear: External ear normal.  Left Ear: External ear normal.  Eyes: Pupils are equal, round, and reactive to light.  Neck: No tracheal deviation present. No thyromegaly present.  Cardiovascular: Normal rate.  Pulmonary/Chest: Effort normal.  Abdominal: Soft.  Neurological: She is alert and oriented to person, place, and time.  Skin: Skin is warm and dry.  Psychiatric: She has a normal mood and affect. Her behavior is normal.    Ortho Exam patient has 80% flexion-extension left versus right wrist trace wrist fusion mild tenderness over the snuffbox.  First dorsal compartment through 6 is normal.  No triggering of the thumb full flexion extension interossei radial median ulnar nerve sensation is intact good flexion supple my per fundi good grip strength.  Cervical range of motion is full minimal brachial plexus tenderness.  No shoulder subluxation elbows reach full extension. Specialty Comments:  No specialty  comments available.  Imaging: No results found.   PMFS History: Patient Active Problem List   Diagnosis Date Noted  . Hypothyroidism 10/23/2017  . Hyperlipidemia, mixed 08/11/2017  . CVA (cerebral vascular accident) ([place]) 08/11/2017  . Morbid obesity ([place]) 08/11/2017  . Vulvar cancer ([place]) 11/13/2011  . 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 History of CVA (cerebrovascular accident) 11/13/2011  . Post-menopausal bleeding 11/13/2011  . HIV INFECTION 09/08/2006  . CANDIDIASIS, VAGINAL 09/08/2006  . 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 PERIPHERAL NEUROPATHY 09/08/2006  . Essential hypertension 09/08/2006    . REFLUX ESOPHAGITIS 09/08/2006  . OVARIAN CYST 09/08/2006  . DYSPLASIA, CERVIX NOS 09/08/2006  . FOLLICULITIS 16/05/9603  . LYMPHADENOPATHY 09/08/2006  . PAP SMEAR, ABNORMAL 10/09/2000   Past Medical History:  Diagnosis Date  . Cancer ([place])   . CVA (cerebral infarction)   . HIV infection ([place])   . HTN (hypertension)   . Hyperlipidemia   . Morbid obesity with BMI of 40.0-44.9, adult ([place])   . Postmenopausal bleeding   . Stroke [facility])    no residual paralysis    Family History  Problem Relation Age of Onset  . Hypertension Mother   . Hypertension Sister   . Arthritis Sister   . Cancer Other   . Diabetes Other     Past Surgical History:  Procedure Laterality Date  . COLONOSCOPY  2000  . HYSTEROSCOPY W/D&C N/A 04/19/2013   Procedure: DILATATION AND CURETTAGE /HYSTEROSCOPY;  Surgeon: Emily Filbert, MD;  Location: [facility];  Service: Gynecology;  Laterality: N/A;   Social History   Occupational History  . Not on file  Tobacco Use  . Smoking status: Former Smoker    Packs/day: 1.00    Years: 5.00    Pack years: 5.00    Types: Cigarettes  . Smokeless tobacco: Never Used  Substance and Sexual Activity  . Alcohol use: No  . Drug use: No  . Sexual activity: Not Currently    Birth control/protection: None, Post-menopausal

## 2018-05-14 ENCOUNTER — Encounter: Payer: Self-pay | Admitting: Family Medicine

## 2018-05-21 DIAGNOSIS — Z79899 Other long term (current) drug therapy: Secondary | ICD-10-CM | POA: Diagnosis not present

## 2018-05-28 ENCOUNTER — Other Ambulatory Visit (HOSPITAL_COMMUNITY): Payer: Medicare Other

## 2018-06-08 ENCOUNTER — Inpatient Hospital Stay (HOSPITAL_COMMUNITY): Payer: Medicare Other | Attending: Internal Medicine | Admitting: Internal Medicine

## 2018-06-17 DIAGNOSIS — H5213 Myopia, bilateral: Secondary | ICD-10-CM | POA: Diagnosis not present

## 2018-06-18 ENCOUNTER — Ambulatory Visit (HOSPITAL_COMMUNITY)
Admission: RE | Admit: 2018-06-18 | Discharge: 2018-06-18 | Disposition: A | Discharge status: Home / Self Care | Payer: Medicare Other | Source: Ambulatory Visit | Attending: Family Medicine | Admitting: Family Medicine

## 2018-06-18 DIAGNOSIS — E2839 Other primary ovarian failure: Secondary | ICD-10-CM | POA: Insufficient documentation

## 2018-06-18 DIAGNOSIS — M85851 Other specified disorders of bone density and structure, right thigh: Secondary | ICD-10-CM | POA: Insufficient documentation

## 2018-06-21 DIAGNOSIS — Z79899 Other long term (current) drug therapy: Secondary | ICD-10-CM | POA: Diagnosis not present

## 2018-06-29 ENCOUNTER — Inpatient Hospital Stay (HOSPITAL_COMMUNITY): Payer: Medicare Other

## 2018-06-29 ENCOUNTER — Inpatient Hospital Stay (HOSPITAL_COMMUNITY): Payer: Medicare Other | Attending: Internal Medicine | Admitting: Internal Medicine

## 2018-06-29 ENCOUNTER — Encounter (HOSPITAL_COMMUNITY): Payer: Self-pay | Admitting: Internal Medicine

## 2018-06-29 VITALS — BP 161/69 | HR 79 | Temp 98.1°F | Resp 20 | Ht 64.0 in | Wt 249.0 lb

## 2018-06-29 DIAGNOSIS — M25551 Pain in right hip: Secondary | ICD-10-CM | POA: Insufficient documentation

## 2018-06-29 DIAGNOSIS — Z7982 Long term (current) use of aspirin: Secondary | ICD-10-CM

## 2018-06-29 DIAGNOSIS — E039 Hypothyroidism, unspecified: Secondary | ICD-10-CM | POA: Insufficient documentation

## 2018-06-29 DIAGNOSIS — D6489 Other specified anemias: Secondary | ICD-10-CM

## 2018-06-29 DIAGNOSIS — M8580 Other specified disorders of bone density and structure, unspecified site: Secondary | ICD-10-CM | POA: Diagnosis not present

## 2018-06-29 DIAGNOSIS — Z79899 Other long term (current) drug therapy: Secondary | ICD-10-CM

## 2018-06-29 DIAGNOSIS — D890 Polyclonal hypergammaglobulinemia: Secondary | ICD-10-CM

## 2018-06-29 DIAGNOSIS — I10 Essential (primary) hypertension: Secondary | ICD-10-CM | POA: Diagnosis not present

## 2018-06-29 DIAGNOSIS — E782 Mixed hyperlipidemia: Secondary | ICD-10-CM | POA: Diagnosis not present

## 2018-06-29 DIAGNOSIS — D649 Anemia, unspecified: Secondary | ICD-10-CM | POA: Diagnosis not present

## 2018-06-29 DIAGNOSIS — M2550 Pain in unspecified joint: Secondary | ICD-10-CM | POA: Diagnosis not present

## 2018-06-29 DIAGNOSIS — D472 Monoclonal gammopathy: Secondary | ICD-10-CM

## 2018-06-29 LAB — COMPREHENSIVE METABOLIC PANEL
ALK PHOS: 69 U/L (ref 38–126)
ALT: 13 U/L (ref 0–44)
ANION GAP: 6 (ref 5–15)
AST: 20 U/L (ref 15–41)
Albumin: 3.5 g/dL (ref 3.5–5.0)
BILIRUBIN TOTAL: 0.8 mg/dL (ref 0.3–1.2)
BUN: 27 mg/dL — AB (ref 8–23)
CO2: 26 mmol/L (ref 22–32)
Calcium: 9.1 mg/dL (ref 8.9–10.3)
Chloride: 105 mmol/L (ref 98–111)
Creatinine, Ser: 1.42 mg/dL — ABNORMAL HIGH (ref 0.44–1.00)
GFR calc Af Amer: 44 mL/min — ABNORMAL LOW (ref >60)
GFR, EST NON AFRICAN AMERICAN: 38 mL/min — AB (ref >60)
Glucose, Bld: 98 mg/dL (ref 70–99)
POTASSIUM: 3.7 mmol/L (ref 3.5–5.1)
Sodium: 137 mmol/L (ref 135–145)
Total Protein: 9 g/dL — ABNORMAL HIGH (ref 6.5–8.1)

## 2018-06-29 LAB — CBC WITH DIFFERENTIAL/PLATELET
Abs Immature Granulocytes: 0.02 10*3/uL (ref 0.00–0.07)
Basophils Absolute: 0 10*3/uL (ref 0.0–0.1)
Basophils Relative: 1 %
EOS ABS: 0.1 10*3/uL (ref 0.0–0.5)
EOS PCT: 1 %
HEMATOCRIT: 35.1 % — AB (ref 36.0–46.0)
HEMOGLOBIN: 11.1 g/dL — AB (ref 12.0–15.0)
Immature Granulocytes: 0 %
LYMPHS ABS: 3 10*3/uL (ref 0.7–4.0)
Lymphocytes Relative: 36 %
MCH: 30.5 pg (ref 26.0–34.0)
MCHC: 31.6 g/dL (ref 30.0–36.0)
MCV: 96.4 fL (ref 80.0–100.0)
MONOS PCT: 7 %
Monocytes Absolute: 0.6 10*3/uL (ref 0.1–1.0)
Neutro Abs: 4.8 10*3/uL (ref 1.7–7.7)
Neutrophils Relative %: 55 %
Platelets: 286 10*3/uL (ref 150–400)
RBC: 3.64 MIL/uL — ABNORMAL LOW (ref 3.87–5.11)
RDW: 13.5 % (ref 11.5–15.5)
WBC: 8.5 10*3/uL (ref 4.0–10.5)
nRBC: 0 % (ref 0.0–0.2)

## 2018-06-29 LAB — LACTATE DEHYDROGENASE: LDH: 148 U/L (ref 98–192)

## 2018-06-29 LAB — FERRITIN: Ferritin: 89 ng/mL (ref 11–307)

## 2018-06-29 LAB — FOLATE: FOLATE: 10.7 ng/mL (ref >5.9)

## 2018-06-29 LAB — VITAMIN B12: Vitamin B-12: 421 pg/mL (ref 180–914)

## 2018-06-29 NOTE — Patient Instructions (Signed)
[facility]  Discharge Instructions:   _______________________________________________________________  Thank you for choosing [facility] at [facility] to provide your oncology and hematology care.  To afford each patient quality time with our providers, please arrive at least 15 minutes before your scheduled appointment.  You need to re-schedule your appointment if you arrive 10 or more minutes late.  We strive to give you quality time with our providers, and arriving late affects you and other patients whose appointments are after yours.  Also, if you no show three or more times for appointments you may be dismissed from the clinic.  Again, thank you for choosing [facility]. Our hope is that these requests will allow you access to exceptional care and in a timely manner. _______________________________________________________________  If you have questions after your visit, please contact our office at [phone] between the hours of 8:30 a.m. and 5:00 p.m. Voicemails left after 4:30 p.m. will not be returned until the following business day. _______________________________________________________________  For prescription refill requests, have your pharmacy contact our office. _______________________________________________________________  Recommendations made by the consultant and any test results will be sent to your referring physician. _______________________________________________________________ 

## 2018-06-29 NOTE — Progress Notes (Signed)
Referring Physician:  Dr. 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  Assessment and Plan:  1. Monoclonal protein.  Pt had labs done 04/13/2018 that showed faint M- spike in gamma region with immunofixation recommended.  She reports joint pain and is undergoing evaluation by Dr Mannie Stabile.  She had Dexa done 06/18/2018 that showed osteopenia.  She is seen today for consultation due to monoclonal protein.    Pt will have labs done today with SPEP, Quant IG, FLC, CBC and chemistries.  She will follow-up in 2 weeks to go over results.  Will consider skeletal survey pending lab results.  All questions answered and pt expressed understanding of the information presented.    2.  Joint pain.  She reports right leg discomfort.  Will consider skeletal survey pending results of myeloma lab evaluation.    3.  HTN.  BP is 161/69.  Follow-up with PCP.    4  Osteopenia.  This was noted on BMD done 06/18/2018.  Follow-up with PCP for management.  Pt may need Calcium and Vitamin D supplementation.    5.  Health maintenance.  Pt had colonoscopy done 02/03/2018 that was normal.  Follow-up with GI as directed.  Pt had screening mammogram done 08/28/2017 that was negative. Screening mammogram recommended in 08/2018.    40 minutes spent with more than  50% spent in review of records, counseling and coordination of care.    HPI:  67 year old female referred for evaluation of monoclonal protein.  Pt reports she has been undergoing evaluation due to joint pain.  Pt had labs done 04/13/2018 that showed faint M- spike in gamma region with immunofixation recommended.  She reports joint pain and is undergoing evaluation by Dr Mannie Stabile.  She had Dexa done 06/18/2018 that showed osteopenia.  Pt is seen today for evaluation due to monoclonal protein.    Problem List Patient Active Problem List   Diagnosis Date Noted  . Hypothyroidism [E03.9] 10/23/2017  . Hyperlipidemia, mixed [E78.2] 08/11/2017  . CVA (cerebral vascular accident) ([place]) [I63.9] 08/11/2017  . Morbid obesity ([place]) [E66.01] 08/11/2017  . Vulvar cancer ([place]) [C51.9] 11/13/2011  . 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 History of CVA (cerebrovascular accident) [Z86.73] 11/13/2011  . Post-menopausal bleeding [N95.0] 11/13/2011  . HIV INFECTION [B20] 09/08/2006  . CANDIDIASIS, VAGINAL [B37.3] 09/08/2006  . 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 PERIPHERAL NEUROPATHY [G60.9] 09/08/2006  . Essential hypertension [I10] 09/08/2006  . REFLUX ESOPHAGITIS [K21.0] 09/08/2006  . OVARIAN CYST [N83.209] 09/08/2006  . DYSPLASIA, CERVIX NOS [N87.9] 09/08/2006  . FOLLICULITIS [P95.0] 93/26/7124  . LYMPHADENOPATHY [R59.9] 09/08/2006  . PAP SMEAR, ABNORMAL [R87.619] 10/09/2000    Past Medical History Past Medical History:  Diagnosis Date  . Cancer ([place])   . CVA (cerebral infarction)   . HIV infection ([place])   . HTN (hypertension)   . Hyperlipidemia   . Morbid obesity with BMI of 40.0-44.9, adult ([place])   . Postmenopausal bleeding   . Stroke [facility])    no residual paralysis  Past Surgical History Past Surgical History:  Procedure Laterality Date  . COLONOSCOPY  2000  . HYSTEROSCOPY W/D&C N/A 04/19/2013   Procedure: DILATATION AND CURETTAGE /HYSTEROSCOPY;  Surgeon: Emily Filbert, MD;   Location: [facility];  Service: Gynecology;  Laterality: N/A;    Family History Family History  Problem Relation Age of Onset  . Hypertension Mother   . Hypertension Sister   . Arthritis Sister   . Cancer Other   . Diabetes Other      Social History  reports that she has quit smoking. Her smoking use included cigarettes. She has a 5.00 pack-year smoking history. She has never used smokeless tobacco. She reports that she does not drink alcohol or use drugs.  Medications  Current Outpatient Medications:  .  aspirin (PX ENTERIC ASPIRIN) 81 MG EC tablet, Take 1 tablet (81 mg total) by mouth daily. Swallow whole., Disp: 30 tablet, Rfl: 12 .  atorvastatin (LIPITOR) 20 MG tablet, Take 0.5 tablets (10 mg total) by mouth daily., Disp: 90 tablet, Rfl: 3 .  Bioflavonoid Products (VITAMIN C) CHEW, Chew 1 tablet by mouth daily., Disp: , Rfl:  .  Cholecalciferol (VITAMIN D) 2000 units CAPS, Take by mouth., Disp: , Rfl:  .  cloNIDine (CATAPRES - DOSED IN MG/24 HR) 0.3 mg/24hr patch, Place 1 patch (0.3 mg total) onto the skin once a week., Disp: 4 patch, Rfl: 12 .  darunavir (PREZISTA) 600 MG tablet, Take 600 mg by mouth 2 (two) times daily with a meal., Disp: , Rfl:  .  doravirine (PIFELTRO) 100 MG TABS tablet, Take by mouth., Disp: , Rfl:  .  levothyroxine (SYNTHROID, LEVOTHROID) 50 MCG tablet, Take 1 tablet (50 mcg total) by mouth daily., Disp: 90 tablet, Rfl: 3 .  lisinopril (PRINIVIL,ZESTRIL) 40 MG tablet, Take 1 tablet (40 mg total) by mouth daily., Disp: 90 tablet, Rfl: 3 .  naproxen (NAPROSYN) 375 MG tablet, Take 1 tablet (375 mg total) by mouth 2 (two) times daily as needed for moderate pain., Disp: 15 tablet, Rfl: 0 .  naproxen (NAPROSYN) 500 MG tablet, Take 1 tablet (500 mg total) by mouth 2 (two) times daily with a meal., Disp: 60 tablet, Rfl: 1 .  ranitidine (ZANTAC) 300 MG tablet, Take by mouth., Disp: , Rfl:  .  ritonavir (NORVIR) 100 MG TABS, Take by mouth 2 (two) times daily with a meal.,  Disp: , Rfl:  .  traZODone (DESYREL) 50 MG tablet, Take 0.5-1 tablets (25-50 mg total) by mouth at bedtime as needed for sleep., Disp: 30 tablet, Rfl: 3 .  triamterene-hydrochlorothiazide (MAXZIDE-25) 37.5-25 MG tablet, Take 1 tablet by mouth daily., Disp: 90 tablet, Rfl: 3 .  vitamin B-12 (CYANOCOBALAMIN) 1000 MCG tablet, Take 1 tablet (1,000 mcg total) by mouth daily., Disp: 90 tablet, Rfl: 1  Allergies Ivp dye [iodinated diagnostic agents]; Tape; Cephalexin; and Latex  Review of Systems Review of Systems - Oncology ROS negative other than right leg discomfort.     Physical Exam  Vitals Wt Readings from Last 3 Encounters:  06/29/18 249 lb (112.9 kg)  05/13/18 249 lb (112.9 kg)  05/07/18 252 lb (114.3 kg)   Temp Readings from Last 3 Encounters:  06/29/18 98.1 F (36.7 C) (Oral)  05/07/18 97.8 F (36.6 C) (Temporal)  03/28/18 98.6 F (37 C)   BP Readings from Last 3 Encounters:  06/29/18 (!) 161/69  05/13/18 (!) 164/100  05/07/18 140/82   Pulse Readings from Last 3 Encounters:  06/29/18 79  05/13/18 70  05/07/18 74   Constitutional: Well-developed, well-nourished, and in no distress.   HENT: Head: Normocephalic and atraumatic.  Mouth/Throat: No oropharyngeal exudate. Mucosa moist. Eyes: Pupils are equal, round, and reactive to light. Conjunctivae are normal. No scleral icterus.  Neck: Normal range of motion. Neck supple. No JVD present.  Cardiovascular: Normal rate, regular rhythm and normal heart sounds.  Exam reveals no gallop and no friction rub.   No murmur heard. Pulmonary/Chest: Effort normal and breath sounds normal. No respiratory distress. No wheezes.No rales.  Abdominal: Soft. Bowel sounds are normal. No distension. There is no tenderness. There is no guarding.  Musculoskeletal: No edema or tenderness. Pt reports right leg discomfort.   Lymphadenopathy: No cervical, axillary or supraclavicular adenopathy.  Neurological: Alert and oriented to person, place,  and time. No cranial nerve deficit.  Skin: Skin is warm and dry. No rash noted. No erythema. No pallor.  Psychiatric: Affect and judgment normal.   Labs No visits with results within 3 Day(s) from this visit.  Latest known visit with results is:  Office Visit on 05/07/2018  Component Date Value Ref Range Status  . Adequacy 05/07/2018 Satisfactory for evaluation  endocervical/transformation zone component PRESENT.   Final  . Diagnosis 05/07/2018 NEGATIVE FOR INTRAEPITHELIAL LESIONS OR MALIGNANCY.   Final  . HPV 05/07/2018 DETECTED*  Final   Normal Reference Range - NOT Detected  . Material Submitted 05/07/2018 CervicoVaginal Pap [ThinPrep Imaged]   Final  . WBC 05/07/2018 5.8  3.8 - 10.8 Thousand/uL Final  . RBC 05/07/2018 3.69* 3.80 - 5.10 Million/uL Final  . Hemoglobin 05/07/2018 11.0* 11.7 - 15.5 g/dL Final  . HCT 05/07/2018 33.3* 35.0 - 45.0 % Final  . MCV 05/07/2018 90.2  80.0 - 100.0 fL Final  . MCH 05/07/2018 29.8  27.0 - 33.0 pg Final  . MCHC 05/07/2018 33.0  32.0 - 36.0 g/dL Final  . RDW 05/07/2018 13.0  11.0 - 15.0 % Final  . Platelets 05/07/2018 207  140 - 400 Thousand/uL Final  . MPV 05/07/2018 10.5  7.5 - 12.5 fL Final  . Neutro Abs 05/07/2018 1,647  1,500 - 7,800 cells/uL Final  . Lymphs Abs 05/07/2018 3,509  850 - 3,900 cells/uL Final  . WBC mixed population 05/07/2018 505  200 - 950 cells/uL Final  . Eosinophils Absolute 05/07/2018 81  15 - 500 cells/uL Final  . Basophils Absolute 05/07/2018 58  0 - 200 cells/uL Final  . Neutrophils Relative % 05/07/2018 28.4  % Final  . Total Lymphocyte 05/07/2018 60.5  % Final  . Monocytes Relative 05/07/2018 8.7  % Final  . Eosinophils Relative 05/07/2018 1.4  % Final  . Basophils Relative 05/07/2018 1.0  % Final     Pathology Orders Placed This Encounter  Procedures  . CBC with Differential/Platelet    Standing Status:   Future    Number of Occurrences:   1    Standing Expiration Date:   06/30/2019  . Comprehensive  metabolic panel    Standing Status:   Future    Number of Occurrences:   1    Standing Expiration Date:   06/30/2019  . Lactate dehydrogenase    Standing Status:   Future    Number of Occurrences:   1    Standing Expiration Date:   06/30/2019  . Protein electrophoresis, serum    Standing Status:   Future    Number of Occurrences:   1    Standing Expiration Date:   06/30/2019  . Ferritin  Standing Status:   Future    Number of Occurrences:   1    Standing Expiration Date:   06/30/2019  . IgG, IgA, IgM    Standing Status:   Future    Number of Occurrences:   1    Standing Expiration Date:   06/30/2019  . Kappa/lambda light chains    Standing Status:   Future    Number of Occurrences:   1    Standing Expiration Date:   06/30/2019  . Immunofixation electrophoresis    Standing Status:   Future    Number of Occurrences:   1    Standing Expiration Date:   06/30/2019  . Vitamin B12    Standing Status:   Future    Number of Occurrences:   1    Standing Expiration Date:   06/30/2019  . Folate    Standing Status:   Future    Number of Occurrences:   1    Standing Expiration Date:   06/30/2019       Zoila Shutter MD

## 2018-06-30 LAB — PROTEIN ELECTROPHORESIS, SERUM
A/G Ratio: 0.6 — ABNORMAL LOW (ref 0.7–1.7)
ALPHA-2-GLOBULIN: 0.7 g/dL (ref 0.4–1.0)
Albumin ELP: 3.3 g/dL (ref 2.9–4.4)
Alpha-1-Globulin: 0.2 g/dL (ref 0.0–0.4)
BETA GLOBULIN: 1.1 g/dL (ref 0.7–1.3)
GAMMA GLOBULIN: 3.4 g/dL — AB (ref 0.4–1.8)
Globulin, Total: 5.4 g/dL — ABNORMAL HIGH (ref 2.2–3.9)
Total Protein ELP: 8.7 g/dL — ABNORMAL HIGH (ref 6.0–8.5)

## 2018-06-30 LAB — KAPPA/LAMBDA LIGHT CHAINS
Kappa free light chain: 105 mg/L — ABNORMAL HIGH (ref 3.3–19.4)
Kappa, lambda light chain ratio: 1.4 (ref 0.26–1.65)
Lambda free light chains: 75.2 mg/L — ABNORMAL HIGH (ref 5.7–26.3)

## 2018-06-30 LAB — IGG, IGA, IGM
IGM (IMMUNOGLOBULIN M), SRM: 140 mg/dL (ref 26–217)
IgA: 309 mg/dL (ref 87–352)
IgG (Immunoglobin G), Serum: 3821 mg/dL — ABNORMAL HIGH (ref 700–1600)

## 2018-07-02 LAB — IMMUNOFIXATION ELECTROPHORESIS
IGG (IMMUNOGLOBIN G), SERUM: 4020 mg/dL — AB (ref 700–1600)
IgA: 308 mg/dL (ref 87–352)
IgM (Immunoglobulin M), Srm: 148 mg/dL (ref 26–217)
TOTAL PROTEIN ELP: 9 g/dL — AB (ref 6.0–8.5)

## 2018-07-07 ENCOUNTER — Ambulatory Visit (INDEPENDENT_AMBULATORY_CARE_PROVIDER_SITE_OTHER): Payer: Self-pay

## 2018-07-07 ENCOUNTER — Ambulatory Visit (INDEPENDENT_AMBULATORY_CARE_PROVIDER_SITE_OTHER): Payer: Medicare Other | Admitting: Orthopaedic Surgery

## 2018-07-07 ENCOUNTER — Encounter (INDEPENDENT_AMBULATORY_CARE_PROVIDER_SITE_OTHER): Payer: Self-pay | Admitting: Orthopaedic Surgery

## 2018-07-07 VITALS — BP 183/89 | HR 64 | Ht 64.0 in | Wt 249.0 lb

## 2018-07-07 DIAGNOSIS — M545 Low back pain, unspecified: Secondary | ICD-10-CM

## 2018-07-07 DIAGNOSIS — M25551 Pain in right hip: Secondary | ICD-10-CM | POA: Diagnosis not present

## 2018-07-07 DIAGNOSIS — G8929 Other chronic pain: Secondary | ICD-10-CM | POA: Diagnosis not present

## 2018-07-07 NOTE — Progress Notes (Signed)
Office Visit Note   Patient: Laura Jimenez           Date of Birth: April 11, 1951           MRN: [id number] Visit Date: 07/07/2018              Requested by: Caren Macadam, [address] [place], [place] [address] PCP: Caren Macadam, MD   Assessment & Plan: Visit Diagnoses:  1. Chronic midline low back pain, unspecified whether sciatica present   2. 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  There is significant degenerative change at the disc space at L5-S1 with an anterior listhesis.  Could have mild stenosis at L5-S1 with referred pain to right thigh with standing or walking any distance.  Will discussed use of anti-inflammatory medicines and try a course of physical therapy at [facility].  Office 6 weeks.  Consider MRI scan if no improvement  Follow-Up Instructions: No follow-ups on file.   Orders:  Orders Placed This Encounter  Procedures  . XR Pelvis 1-2 Views  . XR Lumbar Spine 2-3 Views   No orders of the defined types were placed in this encounter.     Procedures: No procedures performed   Clinical Data: No additional findings.   Subjective: Chief Complaint  Patient presents with  . Follow-up    R HIP PAIN FOR 3 WEEKS RADIATES DOWN LEG HAS NUMNBESS AT TIMES IN CALF AND MUSCLE SPASM. NO PRIOR INJECTIONS OR SURGERY ON BACK OR HIP  Laura Jimenez relates about a 3 to 4-week history of low back pain associated with right lower extremity discomfort into her thigh.  She denies any injury or trauma.  Back and right thigh the longer she is on her feet.  No pain distal to either knee and no discomfort in her left lower extremity.  She actually has had prior problems with her back.  I note that in 2014 she had films of her lumbar spine which I reviewed on the PACS system.  There are degenerative changes at L5-S1 with a very slight anterior listhesis of L5 on S1.  She relates not having any prior treatment.  She has not  tried any anti-inflammatory medicines nor has she had any therapy  HPI  Review of Systems  Constitutional: Negative for fatigue.  HENT: Negative for ear pain.   Eyes: Negative for pain.  Respiratory: Negative for cough and shortness of breath.   Cardiovascular: Negative for leg swelling.  Gastrointestinal: Negative for constipation and diarrhea.  Genitourinary: Negative for difficulty urinating.  Musculoskeletal: Positive for back pain. Negative for neck pain.  Skin: Negative for rash.  Allergic/Immunologic: Negative for food allergies.  Neurological: Positive for weakness and numbness.  Hematological: Does not bruise/bleed easily.  Psychiatric/Behavioral: Negative for sleep disturbance.     Objective: Vital Signs: BP (!) 183/89 (BP Location: Right Arm, Patient Position: Sitting, Cuff Size: Normal)   Pulse 64   Ht 5\' 4"  (1.626 m)   Wt 249 lb (112.9 kg)   BMI 42.74 kg/m   Physical Exam  Constitutional: She is oriented to person, place, and time. She appears well-developed and well-nourished.  HENT:  Mouth/Throat: Oropharynx is clear and moist.  Eyes: Pupils are equal, round, and reactive to light. EOM are normal.  Pulmonary/Chest: Effort normal.  Neurological: She is alert and oriented to person, place, and time.  Skin: Skin is warm and dry.  Psychiatric: She has  a normal mood and affect. Her behavior is normal.    Ortho Exam awake alert and oriented x3.  Comfortable sitting.  Straight leg raise negative bilaterally.  Painless range of motion both hips and both knees.  Motor exam intact.  Minimal swelling of both of her ankles.  No percussible tenderness of the lumbar spine or the sacroiliac joints.  No pain along the lateral aspect of either hip.  Specialty Comments:  No specialty comments available.  Imaging: No results found.   PMFS History: Patient Active Problem List   Diagnosis Date Noted  . Hypothyroidism 10/23/2017  . Hyperlipidemia, mixed 08/11/2017  .  CVA (cerebral vascular accident) ([place]) 08/11/2017  . Morbid obesity ([place]) 08/11/2017  . Vulvar cancer ([place]) 11/13/2011  . 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 History of CVA (cerebrovascular accident) 11/13/2011  . Post-menopausal bleeding 11/13/2011  . HIV INFECTION 09/08/2006  . CANDIDIASIS, VAGINAL 09/08/2006  . 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 PERIPHERAL NEUROPATHY 09/08/2006  . Essential hypertension 09/08/2006  . REFLUX ESOPHAGITIS 09/08/2006  . OVARIAN CYST 09/08/2006  . DYSPLASIA, CERVIX NOS 09/08/2006  . FOLLICULITIS 08/67/6195  . LYMPHADENOPATHY 09/08/2006  . PAP SMEAR, ABNORMAL 10/09/2000   Past Medical History:  Diagnosis Date  . Cancer ([place])   . CVA (cerebral infarction)   . HIV infection ([place])   . HTN (hypertension)   . Hyperlipidemia   . Morbid obesity with BMI of 40.0-44.9, adult ([place])   . Postmenopausal bleeding   . Stroke [facility])    no residual paralysis    Family History  Problem Relation Age of Onset  . Hypertension Mother   . Hypertension Sister   . Arthritis Sister   . Cancer Other   . Diabetes Other     Past Surgical History:  Procedure Laterality Date  . COLONOSCOPY  2000  . HYSTEROSCOPY W/D&C N/A 04/19/2013   Procedure: DILATATION AND CURETTAGE /HYSTEROSCOPY;  Surgeon: Emily Filbert, MD;  Location: [facility];  Service: Gynecology;  Laterality: N/A;   Social History   Occupational History  . Not on file  Tobacco Use  . Smoking status: Former Smoker    Packs/day: 1.00    Years: 5.00    Pack years: 5.00    Types: Cigarettes  . Smokeless tobacco: Never Used  Substance and Sexual Activity  . Alcohol use: No  . Drug use: No  . Sexual activity: Not Currently    Birth control/protection: None, Post-menopausal

## 2018-07-08 ENCOUNTER — Other Ambulatory Visit (INDEPENDENT_AMBULATORY_CARE_PROVIDER_SITE_OTHER): Payer: Self-pay | Admitting: Radiology

## 2018-07-08 DIAGNOSIS — G8929 Other chronic pain: Secondary | ICD-10-CM

## 2018-07-08 DIAGNOSIS — M545 Low back pain, unspecified: Secondary | ICD-10-CM

## 2018-07-14 DIAGNOSIS — M329 Systemic lupus erythematosus, unspecified: Secondary | ICD-10-CM | POA: Diagnosis not present

## 2018-07-14 DIAGNOSIS — Z79899 Other long term (current) drug therapy: Secondary | ICD-10-CM | POA: Diagnosis not present

## 2018-07-14 DIAGNOSIS — K222 Esophageal obstruction: Secondary | ICD-10-CM | POA: Diagnosis not present

## 2018-07-14 DIAGNOSIS — Z9889 Other specified postprocedural states: Secondary | ICD-10-CM | POA: Diagnosis not present

## 2018-07-14 DIAGNOSIS — K449 Diaphragmatic hernia without obstruction or gangrene: Secondary | ICD-10-CM | POA: Diagnosis not present

## 2018-07-15 ENCOUNTER — Other Ambulatory Visit: Payer: Self-pay

## 2018-07-15 ENCOUNTER — Inpatient Hospital Stay (HOSPITAL_BASED_OUTPATIENT_CLINIC_OR_DEPARTMENT_OTHER): Payer: Medicare Other | Admitting: Internal Medicine

## 2018-07-15 ENCOUNTER — Encounter (HOSPITAL_COMMUNITY): Payer: Self-pay | Admitting: Internal Medicine

## 2018-07-15 VITALS — BP 160/61 | HR 65 | Temp 98.0°F | Resp 18 | Wt 251.8 lb

## 2018-07-15 DIAGNOSIS — M8580 Other specified disorders of bone density and structure, unspecified site: Secondary | ICD-10-CM | POA: Diagnosis not present

## 2018-07-15 DIAGNOSIS — E782 Mixed hyperlipidemia: Secondary | ICD-10-CM

## 2018-07-15 DIAGNOSIS — D649 Anemia, unspecified: Secondary | ICD-10-CM

## 2018-07-15 DIAGNOSIS — I10 Essential (primary) hypertension: Secondary | ICD-10-CM | POA: Diagnosis not present

## 2018-07-15 DIAGNOSIS — E039 Hypothyroidism, unspecified: Secondary | ICD-10-CM | POA: Diagnosis not present

## 2018-07-15 DIAGNOSIS — D472 Monoclonal gammopathy: Secondary | ICD-10-CM | POA: Diagnosis not present

## 2018-07-15 DIAGNOSIS — M25551 Pain in right hip: Secondary | ICD-10-CM | POA: Diagnosis not present

## 2018-07-15 NOTE — Progress Notes (Signed)
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  She reports joint pain and is undergoing evaluation by Dr Mannie Stabile.  She had Dexa done 06/18/2018 that showed osteopenia.   Labs done 06/29/2018 reviewed and showed WBC 8.5 HB 11.1 plts 286,000.  Chemistries WNL with K+ 3.7 Cr 1.42 and normal LFTs.  Calcium WNL at 9.1.  SPEP negative.  Normal FLC.  She has an elevated IGG but is polyclonal on immunofixation.  , I have discussed with her SPEP is negative for monoclonal protein.  Findings are not consistent with MGUS.  Pt should follow-up with Dr. Mannie Stabile as directed.  She will have repeat labs done in May 2020.    2.  Joint pain.  Pt has evidence of polyclonal gammopathy.  Follow-up with PCP for monitoring as may be arthritic due to negative myeloma evaluation.    3.  HTN.  BP is 160/61.  Follow-up with PCP.    4  Osteopenia.  This was noted on BMD done 06/18/2018.  Follow-up with PCP for management.  Pt recommended to begin OTC Calcium and Vitamin D supplementation.    5.  Health maintenance.  Pt had colonoscopy done 02/03/2018 that was normal.  Follow-up with GI as directed.  Pt had screening mammogram done 08/28/2017 that was negative. Screening mammogram recommended in 08/2018.    25 minutes spent with more than 50% spent in counseling and coordination of care.    Interval History:  Historical data obtained from note dated 06/29/2018.  67 year old female referred for evaluation of monoclonal protein.  Pt reports she has been undergoing evaluation due  to joint pain.  Pt had labs done 04/13/2018 that showed faint M- spike in gamma region with immunofixation recommended.  She reports joint pain and is undergoing evaluation by Dr Mannie Stabile.  She had Dexa done 06/18/2018 that showed osteopenia.     Current Status:  Pt seen today for follow-up to go over labs.    Problem List Patient Active Problem List   Diagnosis Date Noted  . Hypothyroidism [E03.9] 10/23/2017  . Hyperlipidemia, mixed [E78.2] 08/11/2017  . CVA (cerebral vascular accident) ([place]) [I63.9] 08/11/2017  . Morbid obesity ([place]) [E66.01] 08/11/2017  . Vulvar cancer ([place]) [C51.9] 11/13/2011  . 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 History of CVA (cerebrovascular accident) [Z86.73] 11/13/2011  . Post-menopausal bleeding [N95.0] 11/13/2011  . HIV INFECTION [B20] 09/08/2006  . CANDIDIASIS, VAGINAL [B37.3] 09/08/2006  . 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 PERIPHERAL NEUROPATHY [G60.9] 09/08/2006  . Essential hypertension [I10] 09/08/2006  . REFLUX ESOPHAGITIS [K21.0] 09/08/2006  . OVARIAN CYST [N83.209] 09/08/2006  . DYSPLASIA, CERVIX NOS [N87.9] 09/08/2006  . FOLLICULITIS [P50.9] 32/67/1245  . LYMPHADENOPATHY [R59.9] 09/08/2006  . PAP SMEAR, ABNORMAL [R87.619] 10/09/2000    Past Medical History Past Medical History:  Diagnosis Date  . Cancer ([place])   . CVA (cerebral infarction)   . HIV infection ([place])   . HTN (hypertension)   . Hyperlipidemia   . Morbid obesity with BMI of 40.0-44.9, adult ([place])   . Postmenopausal bleeding   . Stroke [facility])    no residual paralysis    Past Surgical History Past Surgical  History:  Procedure Laterality Date  . COLONOSCOPY  2000  . HYSTEROSCOPY W/D&C N/A 04/19/2013   Procedure: DILATATION AND CURETTAGE /HYSTEROSCOPY;  Surgeon: Emily Filbert, MD;  Location: [facility];  Service: Gynecology;  Laterality: N/A;    Family History Family History  Problem Relation Age of Onset  . Hypertension Mother   . Hypertension Sister   . Arthritis  Sister   . Cancer Other   . Diabetes Other      Social History  reports that she has quit smoking. Her smoking use included cigarettes. She has a 5.00 pack-year smoking history. She has never used smokeless tobacco. She reports that she does not drink alcohol or use drugs.  Medications  Current Outpatient Medications:  .  aspirin (PX ENTERIC ASPIRIN) 81 MG EC tablet, Take 1 tablet (81 mg total) by mouth daily. Swallow whole., Disp: 30 tablet, Rfl: 12 .  atorvastatin (LIPITOR) 20 MG tablet, Take 0.5 tablets (10 mg total) by mouth daily., Disp: 90 tablet, Rfl: 3 .  Bioflavonoid Products (VITAMIN C) CHEW, Chew 1 tablet by mouth daily., Disp: , Rfl:  .  Cholecalciferol (VITAMIN D) 2000 units CAPS, Take by mouth., Disp: , Rfl:  .  cloNIDine (CATAPRES - DOSED IN MG/24 HR) 0.3 mg/24hr patch, Place 1 patch (0.3 mg total) onto the skin once a week., Disp: 4 patch, Rfl: 12 .  darunavir (PREZISTA) 600 MG tablet, Take 600 mg by mouth 2 (two) times daily with a meal., Disp: , Rfl:  .  doravirine (PIFELTRO) 100 MG TABS tablet, Take by mouth., Disp: , Rfl:  .  levothyroxine (SYNTHROID, LEVOTHROID) 50 MCG tablet, Take 1 tablet (50 mcg total) by mouth daily., Disp: 90 tablet, Rfl: 3 .  lisinopril (PRINIVIL,ZESTRIL) 40 MG tablet, Take 1 tablet (40 mg total) by mouth daily., Disp: 90 tablet, Rfl: 3 .  naproxen (NAPROSYN) 375 MG tablet, Take 1 tablet (375 mg total) by mouth 2 (two) times daily as needed for moderate pain. (Patient not taking: Reported on 07/15/2018), Disp: 15 tablet, Rfl: 0 .  naproxen (NAPROSYN) 500 MG tablet, Take 1 tablet (500 mg total) by mouth 2 (two) times daily with a meal. (Patient not taking: Reported on 07/15/2018), Disp: 60 tablet, Rfl: 1 .  ranitidine (ZANTAC) 300 MG tablet, Take by mouth., Disp: , Rfl:  .  ritonavir (NORVIR) 100 MG TABS, Take by mouth 2 (two) times daily with a meal., Disp: , Rfl:  .  traZODone (DESYREL) 50 MG tablet, Take 0.5-1 tablets (25-50 mg total) by mouth at  bedtime as needed for sleep., Disp: 30 tablet, Rfl: 3 .  triamterene-hydrochlorothiazide (MAXZIDE-25) 37.5-25 MG tablet, Take 1 tablet by mouth daily., Disp: 90 tablet, Rfl: 3 .  vitamin B-12 (CYANOCOBALAMIN) 1000 MCG tablet, Take 1 tablet (1,000 mcg total) by mouth daily., Disp: 90 tablet, Rfl: 1  Allergies Ivp dye [iodinated diagnostic agents]; Tape; Cephalexin; and Latex  Review of Systems Review of Systems - Oncology ROS negative other than joint pain   Physical Exam  Vitals Wt Readings from Last 3 Encounters:  07/15/18 251 lb 12.8 oz (114.2 kg)  07/07/18 249 lb (112.9 kg)  06/29/18 249 lb (112.9 kg)   Temp Readings from Last 3 Encounters:  07/15/18 98 F (36.7 C) (Oral)  06/29/18 98.1 F (36.7 C) (Oral)  05/07/18 97.8 F (36.6 C) (Temporal)   BP Readings from Last 3 Encounters:  07/15/18 (!) 160/61  07/07/18 (!) 183/89  06/29/18 (!) 161/69   Pulse Readings from Last 3  Encounters:  07/15/18 65  07/07/18 64  06/29/18 79   Constitutional: Well-developed, well-nourished, and in no distress.   HENT: Head: Normocephalic and atraumatic.  Mouth/Throat: No oropharyngeal exudate. Mucosa moist. Eyes: Pupils are equal, round, and reactive to light. Conjunctivae are normal. No scleral icterus.  Neck: Normal range of motion. Neck supple. No JVD present.  Cardiovascular: Normal rate, regular rhythm and normal heart sounds.  Exam reveals no gallop and no friction rub.   No murmur heard. Pulmonary/Chest: Effort normal and breath sounds normal. No respiratory distress. No wheezes.No rales.  Abdominal: Soft. Bowel sounds are normal. No distension. There is no tenderness. There is no guarding.  Musculoskeletal: No edema or tenderness.  Lymphadenopathy: No cervical, axillary or supraclavicular adenopathy.  Neurological: Alert and oriented to person, place, and time. No cranial nerve deficit.  Skin: Skin is warm and dry. No rash noted. No erythema. No pallor.  Psychiatric: Affect  and judgment normal.   Labs No visits with results within 3 Day(s) from this visit.  Latest known visit with results is:  Appointment on 06/29/2018  Component Date Value Ref Range Status  . WBC 06/29/2018 8.5  4.0 - 10.5 K/uL Final  . RBC 06/29/2018 3.64* 3.87 - 5.11 MIL/uL Final  . Hemoglobin 06/29/2018 11.1* 12.0 - 15.0 g/dL Final  . HCT 06/29/2018 35.1* 36.0 - 46.0 % Final  . MCV 06/29/2018 96.4  80.0 - 100.0 fL Final  . MCH 06/29/2018 30.5  26.0 - 34.0 pg Final  . MCHC 06/29/2018 31.6  30.0 - 36.0 g/dL Final  . RDW 06/29/2018 13.5  11.5 - 15.5 % Final  . Platelets 06/29/2018 286  150 - 400 K/uL Final  . nRBC 06/29/2018 0.0  0.0 - 0.2 % Final  . Neutrophils Relative % 06/29/2018 55  % Final  . Neutro Abs 06/29/2018 4.8  1.7 - 7.7 K/uL Final  . Lymphocytes Relative 06/29/2018 36  % Final  . Lymphs Abs 06/29/2018 3.0  0.7 - 4.0 K/uL Final  . Monocytes Relative 06/29/2018 7  % Final  . Monocytes Absolute 06/29/2018 0.6  0.1 - 1.0 K/uL Final  . Eosinophils Relative 06/29/2018 1  % Final  . Eosinophils Absolute 06/29/2018 0.1  0.0 - 0.5 K/uL Final  . Basophils Relative 06/29/2018 1  % Final  . Basophils Absolute 06/29/2018 0.0  0.0 - 0.1 K/uL Final  . Immature Granulocytes 06/29/2018 0  % Final  . Abs Immature Granulocytes 06/29/2018 0.02  0.00 - 0.07 K/uL Final   Performed at [facility], [address] [place], [place] [address]  . Sodium 06/29/2018 137  135 - 145 mmol/L Final  . Potassium 06/29/2018 3.7  3.5 - 5.1 mmol/L Final  . Chloride 06/29/2018 105  98 - 111 mmol/L Final  . CO2 06/29/2018 26  22 - 32 mmol/L Final  . Glucose, Bld 06/29/2018 98  70 - 99 mg/dL Final  . BUN 06/29/2018 27* 8 - 23 mg/dL Final  . Creatinine, Ser 06/29/2018 1.42* 0.44 - 1.00 mg/dL Final  . Calcium 06/29/2018 9.1  8.9 - 10.3 mg/dL Final  . Total Protein 06/29/2018 9.0* 6.5 - 8.1 g/dL Final  . Albumin 06/29/2018 3.5  3.5 - 5.0 g/dL Final  . AST 06/29/2018 20  15 - 41 U/L Final  . ALT 06/29/2018  13  0 - 44 U/L Final  . Alkaline Phosphatase 06/29/2018 69  38 - 126 U/L Final  . Total Bilirubin 06/29/2018 0.8  0.3 - 1.2 mg/dL Final  .  GFR calc non Af Amer 06/29/2018 38* >60 mL/min Final  . GFR calc Af Amer 06/29/2018 44* >60 mL/min Final   Comment: (NOTE) The eGFR has been calculated using the CKD EPI equation. This calculation has not been validated in all clinical situations. eGFR's persistently <60 mL/min signify possible Chronic Kidney Disease.   Georgiann Hahn gap 06/29/2018 6  5 - 15 Final   Performed at [facility], [address], [place], [place] [address]  . LDH 06/29/2018 148  98 - 192 U/L Final   Performed at [facility], [address], [place], [place] [address]  . Total Protein ELP 06/29/2018 8.7* 6.0 - 8.5 g/dL Final  . Albumin ELP 06/29/2018 3.3  2.9 - 4.4 g/dL Final  . Alpha-1-Globulin 06/29/2018 0.2  0.0 - 0.4 g/dL Final  . Alpha-2-Globulin 06/29/2018 0.7  0.4 - 1.0 g/dL Final  . Beta Globulin 06/29/2018 1.1  0.7 - 1.3 g/dL Final  . Gamma Globulin 06/29/2018 3.4* 0.4 - 1.8 g/dL Final  . M-Spike, % 06/29/2018 Not Observed  Not Observed g/dL Final  . SPE Interp. 06/29/2018 Comment   Final   Comment: (NOTE) The SPE pattern reflects a polyclonal increase in gamma globulin due to numerous clones of plasma cells producing heterogeneous antibody in response to some form of antigenic stimulus.  Hypergammaglob- ulinemia is found in a wide variety of infectious, non-infectious, and autoimmune disease states. Evidence of monoclonal protein is not apparent. Performed At: [facility] [place], [place] [id number] Rush Farmer MD RW:[id number]   . Comment 06/29/2018 Comment   Final   Comment: (NOTE) Protein electrophoresis scan will follow via computer, mail, or courier delivery.   Marland Kitchen GLOBULIN, TOTAL 06/29/2018 5.4* 2.2 - 3.9 g/dL Corrected  . A/G Ratio 06/29/2018 0.6* 0.7 - 1.7 Corrected  . Ferritin 06/29/2018 89  11 - 307 ng/mL Final   Performed  at [facility], [address], [place], [place] [address]  . IgG (Immunoglobin G), Serum 06/29/2018 3,821* 700 - 1,600 mg/dL Final  . IgA 06/29/2018 309  87 - 352 mg/dL Final  . IgM (Immunoglobulin M), Srm 06/29/2018 140  26 - 217 mg/dL Final   Comment: (NOTE) Performed At: [facility] [place], [place] [id number] Rush Farmer MD [id number]   . Kappa free light chain 06/29/2018 105.0* 3.3 - 19.4 mg/L Final  . Lamda free light chains 06/29/2018 75.2* 5.7 - 26.3 mg/L Final  . Kappa, lamda light chain ratio 06/29/2018 1.40  0.26 - 1.65 Final   Comment: (NOTE) Performed At: [facility] [place], [place] [id number] Rush Farmer MD [id number]   . Total Protein ELP 06/29/2018 9.0* 6.0 - 8.5 g/dL Final  . IgG (Immunoglobin G), Serum 06/29/2018 4,020* 700 - 1,600 mg/dL Final  . IgA 06/29/2018 308  87 - 352 mg/dL Final  . IgM (Immunoglobulin M), Srm 06/29/2018 148  26 - 217 mg/dL Final   Comment: (NOTE) Performed At: [facility] [place], [place] [id number] Rush Farmer MD [id number]   . Immunofixation Result, Serum 06/29/2018 Comment   Corrected   Comment: (NOTE) An apparent polyclonal gammopathy: IgG. Kappa and lambda typing appear increased.   . Vitamin B-12 06/29/2018 421  180 - 914 pg/mL Final   Comment: (NOTE) This assay is not validated for testing neonatal or myeloproliferative syndrome specimens for Vitamin B12 levels. Performed at [facility], [address], [place], [place] [address]   . Folate 06/29/2018 10.7  >5.9 ng/mL  Final   Performed at [facility], [address], [place], [place] [address]     Pathology Orders Placed This Encounter  Procedures  . CBC with Differential/Platelet    Standing Status:   Future    Standing Expiration Date:   07/15/2020  . Comprehensive metabolic panel    Standing Status:   Future    Standing Expiration Date:   07/15/2020  . Lactate dehydrogenase     Standing Status:   Future    Standing Expiration Date:   07/15/2020  . Protein electrophoresis, serum    Standing Status:   Future    Standing Expiration Date:   07/15/2020  . IgG, IgA, IgM    Standing Status:   Future    Standing Expiration Date:   07/15/2020  . Kappa/lambda light chains    Standing Status:   Future    Standing Expiration Date:   07/15/2020  . Immunofixation electrophoresis    Standing Status:   Future    Standing Expiration Date:   07/15/2020       Laura Shutter MD

## 2018-07-28 ENCOUNTER — Ambulatory Visit (HOSPITAL_COMMUNITY): Payer: Medicare Other | Attending: Orthopaedic Surgery | Admitting: Physical Therapy

## 2018-07-28 DIAGNOSIS — M6281 Muscle weakness (generalized): Secondary | ICD-10-CM

## 2018-07-28 DIAGNOSIS — M5441 Lumbago with sciatica, right side: Secondary | ICD-10-CM | POA: Insufficient documentation

## 2018-07-28 NOTE — Patient Instructions (Addendum)
Backward Bend (Standing)    Arch backward to make hollow of back deeper. Hold __3__ seconds. Repeat 10____ times per set. Do __1__ sets per session. Do __2__ sessions per day.  http://orth.exer.us/178   Copyright  VHI. All rights reserved.  On Elbows (Prone)    Rise up on elbows as high as possible, keeping hips on floor. Hold _30___ seconds. Repeat __1__ times per set. Do _1___ sets per session. Do _2___ sessions per day.  http://orth.exer.us/92   Copyright  VHI. All rights reserved.  Hamstring Stretch: Active    Support behind right knee. Starting with knee bent, attempt to straighten knee until a comfortable stretch is felt in back of thigh. Hold _30___ seconds. Repeat __3__ times per set. Do __1__ sets per session. Do 2____ sessions per day.  http://orth.exer.us/158   Copyright  VHI. All rights reserved.  Isometric Abdominal    Lying on back with knees bent, tighten stomach by pressing elbows down. Hold _3___ seconds. Repeat _10___ times per set. Do ___1_ sets per session. Do __2__ sessions per day.  http://orth.exer.us/1086   Copyright  VHI. All rights reserved.  Bridging    Slowly raise buttocks from floor, keeping stomach tight. Repeat ___10_ times per set. Do ____1 sets per session. Do __2__ sessions per day.  http://orth.exer.us/1096   Copyright  VHI. All rights reserved.

## 2018-07-28 NOTE — Therapy (Signed)
[facility] [address] [place], [place], [address] Phone: [phone]   Fax:  [phone]  Physical Therapy Evaluation  Patient Details  Name: Laura Jimenez MRN: [id number] Date of Birth: 25-Feb-1951 Referring Provider (PT): Joni Fears    Encounter Date: 07/28/2018  PT End of Session - 07/28/18 1159    Visit Number  1    Number of Visits  8    Date for PT Re-Evaluation  08/27/18    PT Start Time  1115    PT Stop Time  1200    PT Time Calculation (min)  45 min    Activity Tolerance  Patient tolerated treatment well    Behavior During Therapy  [facility] for tasks assessed/performed       Past Medical History:  Diagnosis Date  . Cancer ([place])   . CVA (cerebral infarction)   . HIV infection ([place])   . HTN (hypertension)   . Hyperlipidemia   . Morbid obesity with BMI of 40.0-44.9, adult ([place])   . Postmenopausal bleeding   . Stroke [facility])    no residual paralysis    Past Surgical History:  Procedure Laterality Date  . COLONOSCOPY  2000  . HYSTEROSCOPY W/D&C N/A 04/19/2013   Procedure: DILATATION AND CURETTAGE /HYSTEROSCOPY;  Surgeon: Emily Filbert, MD;  Location: [facility];  Service: Gynecology;  Laterality: N/A;    There were no vitals filed for this visit.   Subjective Assessment - 07/28/18 1127    Subjective  Laura Jimenez states that she has been having low back pain that radiates into her Rt leg for over a month now.  The pain is greatest with weight bearing.      Limitations  Standing;Walking;House hold activities    How long can you sit comfortably?  no problem     How long can you stand comfortably?  able to stand for 15 minutes at the most.     How long can you walk comfortably?  Able to walk for about 15 minutes     Patient Stated Goals  less pain and to be able to walk better.     Currently in Pain?  Yes    Pain Location  Back    Pain Orientation  Lower    Pain Descriptors / Indicators  Aching;Throbbing    Pain Type   Acute pain    Pain Radiating Towards  Rt leg to the knee level     Pain Onset  More than a month ago    Pain Frequency  Intermittent    Aggravating Factors   weight bearing     Pain Relieving Factors  sitting    Effect of Pain on Daily Activities  limits          [facility] PT Assessment - 07/28/18 0001      Assessment   Medical Diagnosis   low back pain    Referring Provider (PT)  Joni Fears     Onset Date/Surgical Date  06/25/18    Prior Therapy  none      Precautions   Precautions  None      Restrictions   Weight Bearing Restrictions  No      Balance Screen   Has the patient fallen in the past 6 months  No    Has the patient had a decrease in activity level because of a fear of falling?   Yes    Is the patient reluctant to leave their home  because of a fear of falling?   No      Prior Function   Level of Independence  Independent    Vocation  Retired      Charity fundraiser Status  Within Functional Limits for tasks assessed      Observation/Other Assessments   Focus on Therapeutic Outcomes (FOTO)   47      Functional Tests   Functional tests  Single leg stance;Sit to Stand      Single Leg Stance   Comments  Rt: 11";   LT 22"      Sit to Stand   Comments  5 x 20.45       Posture/Postural Control   Posture/Postural Control  Postural limitations    Postural Limitations  Rounded Shoulders;Forward head;Decreased thoracic kyphosis    Posture Comments  PT tends to lean on her Y ligament       ROM / Strength   AROM / PROM / Strength  AROM;Strength      AROM   AROM Assessment Site  Lumbar    Lumbar Flexion  decreased 50% with immediate radicular sx     Lumbar Extension  30   reps no change      Strength   Strength Assessment Site  Hip;Knee;Ankle    Right/Left Hip  Right;Left    Right Hip Flexion  4+/5    Right Hip Extension  5/5    Right Hip ABduction  5/5    Left Hip Flexion  4+/5    Left Hip Extension  3/5    Left Hip ABduction  5/5     Right/Left Knee  Right;Left    Right Knee Flexion  3+/5    Right Knee Extension  5/5    Left Knee Flexion  5/5    Left Knee Extension  5/5    Right/Left Ankle  Right;Left    Right Ankle Dorsiflexion  5/5    Left Ankle Dorsiflexion  5/5      Flexibility   Soft Tissue Assessment /Muscle Length  yes    Hamstrings  LT 153; RT 153      Ambulation/Gait   Ambulation Distance (Feet)  625 Feet    Assistive device  None    Gait Comments  3'                Objective measurements completed on examination: See above findings.      [facility] Adult PT Treatment/Exercise - 07/28/18 0001      Exercises   Exercises  Lumbar      Lumbar Exercises: Stretches   Active Hamstring Stretch  Right;Left;2 reps;30 seconds    Standing Extension  5 reps      Lumbar Exercises: Supine   Ab Set  10 reps    Bridge  10 reps             PT Education - 07/28/18 1157    Education Details  HEp     Person(s) Educated  Patient    Methods  Explanation    Comprehension  Verbalized understanding;Returned demonstration       PT Short Term Goals - 07/28/18 1246      PT SHORT TERM GOAL #1   Title  PT to pain to go no further than her right hip to demonstrate decreased nerve irritation     Time  2    Period  Weeks    Status  New    Target Date  08/11/18      PT SHORT TERM GOAL #2   Title  PT pain to be no greater than a 5/10 to allow pt to stand for 15 minutes in comfort to allow pt to shower, make small meals without increased pain.     Time  2    Period  Weeks    Status  New      PT SHORT TERM GOAL #3   Title  PT to be able to walk for 15 minutes without having increased pain to allow pt complete short shopping trips in comfort.         PT Long Term Goals - 07/28/18 1248      PT LONG TERM GOAL #1   Title  PT to have no radicular pain to demonstrate no nerve irritation     Time  4    Period  Weeks    Status  New    Target Date  08/26/18      PT LONG TERM GOAL #2   Title  Pt  low back and hip ROM to improve to allow pt to be able to bend over to pick up an item off the floor in comfort.     Time  4    Period  Weeks    Status  New      PT LONG TERM GOAL #3   Title  PT Rt LE strength to be at least 4+/5 to allow pt to be able to come sit to stand from low lying levels with ease.     Time  4    Period  Weeks    Status  New      PT LONG TERM GOAL #4   Title  PT to be able to be on her feet for over an hour without increased back or leg pain to allow shopping and household tasks to be completed.    Time  4    Period  Weeks    Status  New             Plan - 07/28/18 1241    Clinical Impression Statement  Laura Jimenez is a 67 yo female who has had radiating pain into her right leg for over a month.  The pain is aggrevated with weight bearing activities.  She is currently being referred to skilled physical therapy; evaluation demonstrates increased pain, decreased activity tolerance, decreased ROM, decreased strength, decreased balance and increased pain.  Laura Jimenez will benefit from skilled PT to address these issues and improve her functioning ability.     History and Personal Factors relevant to plan of care:  CVA, obesity    Clinical Presentation  Stable    Clinical Decision Making  Low    Rehab Potential  Good    PT Frequency  2x / week    PT Duration  4 weeks    PT Treatment/Interventions  ADLs/Self Care Home Management;Manual techniques;Patient/family education;Therapeutic activities;Therapeutic exercise    PT Next Visit Plan  Continue with core and LE strengthening as well as postural education.     PT Home Exercise Plan  ab set, bridge, POE and standing extension     Consulted and Agree with Plan of Care  Patient       Patient will benefit from skilled therapeutic intervention in order to improve the following deficits and impairments:  Decreased activity tolerance, Decreased balance, Decreased range of motion, Decreased strength, Difficulty  walking, Obesity, Pain, Postural dysfunction  Visit Diagnosis: Acute right-sided low back pain with right-sided sciatica  Muscle weakness (generalized)     Problem List Patient Active Problem List   Diagnosis Date Noted  . Hypothyroidism 10/23/2017  . Hyperlipidemia, mixed 08/11/2017  . CVA (cerebral vascular accident) ([place]) 08/11/2017  . Morbid obesity ([place]) 08/11/2017  . Vulvar cancer ([place]) 11/13/2011  . 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 History of CVA (cerebrovascular accident) 11/13/2011  . Post-menopausal bleeding 11/13/2011  . HIV INFECTION 09/08/2006  . CANDIDIASIS, VAGINAL 09/08/2006  . 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 PERIPHERAL NEUROPATHY 09/08/2006  . Essential hypertension 09/08/2006  . REFLUX ESOPHAGITIS 09/08/2006  . OVARIAN CYST 09/08/2006  . DYSPLASIA, CERVIX NOS 09/08/2006  . FOLLICULITIS 29/79/8921  . LYMPHADENOPATHY 09/08/2006  . PAP SMEAR, ABNORMAL 10/09/2000    Rayetta Humphrey, PT CLT [phone] 07/28/2018, 12:53 PM  [facility] [address] [place], [place], [address] Phone: [phone]   Fax:  [phone]  Name: NAZARETH NORENBERG MRN: [id number] Date of Birth: 1950-09-18

## 2018-07-30 ENCOUNTER — Ambulatory Visit (HOSPITAL_COMMUNITY): Payer: Medicare Other | Admitting: Physical Therapy

## 2018-07-30 DIAGNOSIS — M5441 Lumbago with sciatica, right side: Secondary | ICD-10-CM | POA: Diagnosis not present

## 2018-07-30 DIAGNOSIS — M6281 Muscle weakness (generalized): Secondary | ICD-10-CM | POA: Diagnosis not present

## 2018-07-30 NOTE — Patient Instructions (Addendum)
Flexibility: Neck Retraction    Pull head straight back, keeping eyes and jaw level. Repeat __10__ times per set. Do __1__ sets per session. Do _2___ sessions per day.  http://orth.exer.us/344   Copyright  VHI. All rights reserved.  Scapular Retraction (Standing)    With arms at sides, pinch shoulder blades together. Repeat 10____ times per set. Do __1__ sets per session. Do ____ sessions per day. 2 http://orth.exer.us/944   Copyright  VHI. All rights reserved.  Heel Raise: Bilateral (Standing)   Holding onto a counter  Rise on balls of feet. Repeat __10__ times per set. Do __1__ sets per session. Do 2____ sessions per day.  http://orth.exer.us/38   Copyright  VHI. All rights reserved.  Functional Quadriceps: Chair Squat    Keeping feet flat on floor, shoulder width apart, squat as low as is comfortable. Use support as necessary. Repeat __10__ times per set. Do __1__ sets per session. Do __2__ sessions per day.  http://orth.exer.us/736   Copyright  VHI. All rights reserved.

## 2018-07-30 NOTE — Therapy (Signed)
[facility], [place], [address] Phone: [phone]   Fax:  [phone]  Physical Therapy Treatment  Patient Details  Name: Laura Jimenez MRN: [id number] Date of Birth: 1951/08/16 Referring Provider (PT): Joni Fears    Encounter Date: 07/30/2018  PT End of Session - 07/30/18 1109    Visit Number  2    Number of Visits  8    Date for PT Re-Evaluation  08/27/18    Authorization Type  [employer]HC medicaid    PT Start Time  1115    PT Stop Time  1155    PT Time Calculation (min)  40 min    Activity Tolerance  Patient tolerated treatment well    Behavior During Therapy  [facility] for tasks assessed/performed       Past Medical History:  Diagnosis Date  . Cancer ([place])   . CVA (cerebral infarction)   . HIV infection ([place])   . HTN (hypertension)   . Hyperlipidemia   . Morbid obesity with BMI of 40.0-44.9, adult ([place])   . Postmenopausal bleeding   . Stroke [facility])    no residual paralysis    Past Surgical History:  Procedure Laterality Date  . COLONOSCOPY  2000  . HYSTEROSCOPY W/D&C N/A 04/19/2013   Procedure: DILATATION AND CURETTAGE /HYSTEROSCOPY;  Surgeon: Emily Filbert, MD;  Location: [facility];  Service: Gynecology;  Laterality: N/A;    There were no vitals filed for this visit.  Subjective Assessment - 07/30/18 1157    Subjective  Pt states that she did her exercises and has not questions     Limitations  Standing;Walking;House hold activities    How long can you sit comfortably?  no problem     How long can you stand comfortably?  able to stand for 15 minutes at the most.     How long can you walk comfortably?  Able to walk for about 15 minutes     Patient Stated Goals  less pain and to be able to walk better.     Currently in Pain?  Yes    Pain Score  4     Pain Location  Back    Pain Orientation  Lower    Pain Descriptors / Indicators  Aching    Pain Type  Chronic pain    Pain Onset  More than a month ago     Pain Frequency  Constant    Aggravating Factors   activity    Pain Relieving Factors  rest    Effect of Pain on Daily Activities  limits                       [facility] Adult PT Treatment/Exercise - 07/30/18 0001      Exercises   Exercises  Lumbar      Lumbar Exercises: Stretches   Active Hamstring Stretch  --    Single Knee to Chest Stretch  Right;Left;2 reps;30 seconds    Standing Side Bend  Right;Left;5 reps    Standing Extension  10 reps    Prone on Elbows Stretch  5 reps      Lumbar Exercises: Standing   Heel Raises  10 reps    Functional Squats  10 reps      Lumbar Exercises: Seated   Other Seated Lumbar Exercises  cervical and scapular retraction x 10      Lumbar Exercises: Supine   Ab Set  10  reps    Clam  10 reps    Bent Knee Raise  10 reps    Bridge  10 reps      Lumbar Exercises: Prone   Other Prone Lumbar Exercises  heel squeeze x 10              PT Education - 07/30/18 1127    Education Details  updated HEP    Person(s) Educated  Patient    Methods  Handout;Explanation    Comprehension  Returned demonstration       PT Short Term Goals - 07/30/18 1112      PT SHORT TERM GOAL #1   Title  PT to pain to go no further than her right hip to demonstrate decreased nerve irritation     Time  2    Period  Weeks    Status  On-going      PT SHORT TERM GOAL #2   Title  PT pain to be no greater than a 5/10 to allow pt to stand for 15 minutes in comfort to allow pt to shower, make small meals without increased pain.     Time  2    Period  Weeks    Status  On-going      PT SHORT TERM GOAL #3   Title  PT to be able to walk for 15 minutes without having increased pain to allow pt complete short shopping trips in comfort.     Status  On-going        PT Long Term Goals - 07/30/18 1128      PT LONG TERM GOAL #1   Title  PT to have no radicular pain to demonstrate no nerve irritation     Time  4    Period  Weeks    Status  On-going       PT LONG TERM GOAL #2   Title  Pt low back and hip ROM to improve to allow pt to be able to bend over to pick up an item off the floor in comfort.     Time  4    Period  Weeks    Status  On-going      PT LONG TERM GOAL #3   Title  PT Rt LE strength to be at least 4+/5 to allow pt to be able to come sit to stand from low lying levels with ease.     Time  4    Period  Weeks    Status  On-going      PT LONG TERM GOAL #4   Title  PT to be able to be on her feet for over an hour without increased back or leg pain to allow shopping and household tasks to be completed.    Time  4    Period  Weeks    Status  On-going            Plan - 07/30/18 1111    Clinical Impression Statement  Evaluation and goals reviewed with pt.  Begain wt bearing strengthening as well as progressing non weight bearing stabilization exercises.  PT needs moderate  cuing to perform activities correctly.      Rehab Potential  Good    PT Frequency  2x / week    PT Duration  4 weeks    PT Treatment/Interventions  ADLs/Self Care Home Management;Manual techniques;Patient/family education;Therapeutic activities;Therapeutic exercise    PT Next Visit Plan  Continue with core and LE  strengthening as well as postural education.     PT Home Exercise Plan  ab set, bridge, POE and standing extension ; 12/6:  heel raise; functional squat , cervicaland scapular retraction     Consulted and Agree with Plan of Care  Patient       Patient will benefit from skilled therapeutic intervention in order to improve the following deficits and impairments:  Decreased activity tolerance, Decreased balance, Decreased range of motion, Decreased strength, Difficulty walking, Obesity, Pain, Postural dysfunction  Visit Diagnosis: Acute right-sided low back pain with right-sided sciatica  Muscle weakness (generalized)     Problem List Patient Active Problem List   Diagnosis Date Noted  . Hypothyroidism 10/23/2017  .  Hyperlipidemia, mixed 08/11/2017  . CVA (cerebral vascular accident) ([place]) 08/11/2017  . Morbid obesity ([place]) 08/11/2017  . Vulvar cancer ([place]) 11/13/2011  . 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 History of CVA (cerebrovascular accident) 11/13/2011  . Post-menopausal bleeding 11/13/2011  . HIV INFECTION 09/08/2006  . CANDIDIASIS, VAGINAL 09/08/2006  . 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 PERIPHERAL NEUROPATHY 09/08/2006  . Essential hypertension 09/08/2006  . REFLUX ESOPHAGITIS 09/08/2006  . OVARIAN CYST 09/08/2006  . DYSPLASIA, CERVIX NOS 09/08/2006  . FOLLICULITIS 27/74/1287  . LYMPHADENOPATHY 09/08/2006  . PAP SMEAR, ABNORMAL 10/09/2000    Rayetta Humphrey, PT CLT [phone] 07/30/2018, 11:59 AM  [facility] [address] [place], [place], [address] Phone: [phone]   Fax:  [phone]  Name: Laura Jimenez MRN: [id number] Date of Birth: [birth year]

## 2018-08-03 ENCOUNTER — Ambulatory Visit (HOSPITAL_COMMUNITY): Payer: Medicare Other | Admitting: Physical Therapy

## 2018-08-03 DIAGNOSIS — M6281 Muscle weakness (generalized): Secondary | ICD-10-CM | POA: Diagnosis not present

## 2018-08-03 DIAGNOSIS — M5441 Lumbago with sciatica, right side: Secondary | ICD-10-CM

## 2018-08-03 NOTE — Therapy (Signed)
[facility] [place], [place], [address] Phone: [phone]   Fax:  [phone]  Physical Therapy Treatment  Patient Details  Name: Laura Jimenez MRN: [id number] Date of Birth: 10-21-1950 Referring Provider (PT): Joni Fears    Encounter Date: 08/03/2018  PT End of Session - 08/03/18 1032    Visit Number  3    Number of Visits  8    Date for PT Re-Evaluation  08/27/18    Authorization Type  [employer]HC medicaid    PT Start Time  [phone]    PT Stop Time  1030    PT Time Calculation (min)  42 min    Activity Tolerance  Patient tolerated treatment well    Behavior During Therapy  [facility] for tasks assessed/performed       Past Medical History:  Diagnosis Date  . Cancer ([place])   . CVA (cerebral infarction)   . HIV infection ([place])   . HTN (hypertension)   . Hyperlipidemia   . Morbid obesity with BMI of 40.0-44.9, adult ([place])   . Postmenopausal bleeding   . Stroke [facility])    no residual paralysis    Past Surgical History:  Procedure Laterality Date  . COLONOSCOPY  2000  . HYSTEROSCOPY W/D&C N/A 04/19/2013   Procedure: DILATATION AND CURETTAGE /HYSTEROSCOPY;  Surgeon: Emily Filbert, MD;  Location: [facility];  Service: Gynecology;  Laterality: N/A;    There were no vitals filed for this visit.  Subjective Assessment - 08/03/18 0949    Subjective  pt states she has no pain other than her Rt great toe, no known reason why it is hurting.  no back pain today.    Currently in Pain?  No/denies                       [facility] Adult PT Treatment/Exercise - 08/03/18 0001      Exercises   Exercises  Lumbar      Lumbar Exercises: Stretches   Active Hamstring Stretch  Right;Left;2 reps;30 seconds    Active Hamstring Stretch Limitations  onto 12" box    Standing Extension  10 reps    Prone on Elbows Stretch  Limitations    Prone on Elbows Stretch Limitations  3 minutes      Lumbar Exercises: Standing   Heel Raises  10  reps    Heel Raises Limitations  toeraises 10 reps    Functional Squats  10 reps    Forward Lunge  10 reps;Limitations    Forward Lunge Limitations  onto 4" step without UE assist       Lumbar Exercises: Supine   Ab Set  10 reps    Clam  10 reps    Bent Knee Raise  10 reps    Bridge  10 reps    Straight Leg Raise  10 reps               PT Short Term Goals - 07/30/18 1112      PT SHORT TERM GOAL #1   Title  PT to pain to go no further than her right hip to demonstrate decreased nerve irritation     Time  2    Period  Weeks    Status  On-going      PT SHORT TERM GOAL #2   Title  PT pain to be no greater than a 5/10 to allow pt to stand for 15 minutes in  comfort to allow pt to shower, make small meals without increased pain.     Time  2    Period  Weeks    Status  On-going      PT SHORT TERM GOAL #3   Title  PT to be able to walk for 15 minutes without having increased pain to allow pt complete short shopping trips in comfort.     Status  On-going        PT Long Term Goals - 07/30/18 1128      PT LONG TERM GOAL #1   Title  PT to have no radicular pain to demonstrate no nerve irritation     Time  4    Period  Weeks    Status  On-going      PT LONG TERM GOAL #2   Title  Pt low back and hip ROM to improve to allow pt to be able to bend over to pick up an item off the floor in comfort.     Time  4    Period  Weeks    Status  On-going      PT LONG TERM GOAL #3   Title  PT Rt LE strength to be at least 4+/5 to allow pt to be able to come sit to stand from low lying levels with ease.     Time  4    Period  Weeks    Status  On-going      PT LONG TERM GOAL #4   Title  PT to be able to be on her feet for over an hour without increased back or leg pain to allow shopping and household tasks to be completed.    Time  4    Period  Weeks    Status  On-going            Plan - 08/03/18 1032    Clinical Impression Statement  contiued with focus on improving  core stab, postural and LE strength.  Difficulty completing squats in correct form with weight shifted forward.  Cues needed for abdominal iso for breathing and hold times.  Pt without return of pai or issues during or end of session today.  No additional exercises given for HEP.      Rehab Potential  Good    PT Frequency  2x / week    PT Duration  4 weeks    PT Treatment/Interventions  ADLs/Self Care Home Management;Manual techniques;Patient/family education;Therapeutic activities;Therapeutic exercise    PT Next Visit Plan  Continue with core and LE strengthening as well as postural education.     PT Home Exercise Plan  ab set, bridge, POE and standing extension ; 12/6:  heel raise; functional squat , cervicaland scapular retraction     Consulted and Agree with Plan of Care  Patient       Patient will benefit from skilled therapeutic intervention in order to improve the following deficits and impairments:  Decreased activity tolerance, Decreased balance, Decreased range of motion, Decreased strength, Difficulty walking, Obesity, Pain, Postural dysfunction  Visit Diagnosis: Acute right-sided low back pain with right-sided sciatica  Muscle weakness (generalized)     Problem List Patient Active Problem List   Diagnosis Date Noted  . Hypothyroidism 10/23/2017  . Hyperlipidemia, mixed 08/11/2017  . CVA (cerebral vascular accident) ([place]) 08/11/2017  . Morbid obesity ([place]) 08/11/2017  . Vulvar cancer ([place]) 11/13/2011  . 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 History of CVA (cerebrovascular accident) 11/13/2011  .  Post-menopausal bleeding 11/13/2011  . HIV INFECTION 09/08/2006  . CANDIDIASIS, VAGINAL 09/08/2006  . 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 PERIPHERAL NEUROPATHY 09/08/2006  . Essential hypertension 09/08/2006  . REFLUX ESOPHAGITIS 09/08/2006  . OVARIAN CYST 09/08/2006  . DYSPLASIA, CERVIX NOS 09/08/2006  . FOLLICULITIS 32/99/2426  . LYMPHADENOPATHY 09/08/2006  . PAP SMEAR, ABNORMAL  10/09/2000   Teena Irani, PTA/CLT [phone]  Teena Irani 08/03/2018, 10:35 AM  [facility] [place], [place], [address] Phone: [phone]   Fax:  [phone]  Name: Laura Jimenez MRN: [id number] Date of Birth: December 14, 1950

## 2018-08-05 ENCOUNTER — Ambulatory Visit (HOSPITAL_COMMUNITY): Payer: Medicare Other | Admitting: Physical Therapy

## 2018-08-05 DIAGNOSIS — M6281 Muscle weakness (generalized): Secondary | ICD-10-CM | POA: Diagnosis not present

## 2018-08-05 DIAGNOSIS — M5441 Lumbago with sciatica, right side: Secondary | ICD-10-CM

## 2018-08-05 NOTE — Therapy (Signed)
[place], [place], [address] Phone: [phone]   Fax:  [phone]  Physical Therapy Treatment  Patient Details  Name: Laura Jimenez MRN: [id number] Date of Birth: 1950-11-05 Referring Provider (PT): Joni Fears    Encounter Date: 08/05/2018  PT End of Session - 08/05/18 1024    Visit Number  4    Number of Visits  8    Date for PT Re-Evaluation  08/27/18    Authorization Type  [employer] medicaid    PT Start Time  [phone]    PT Stop Time  1020    PT Time Calculation (min)  42 min    Activity Tolerance  Patient tolerated treatment well    Behavior During Therapy  [facility] for tasks assessed/performed       Past Medical History:  Diagnosis Date  . Cancer ([place])   . CVA (cerebral infarction)   . HIV infection ([place])   . HTN (hypertension)   . Hyperlipidemia   . Morbid obesity with BMI of 40.0-44.9, adult ([place])   . Postmenopausal bleeding   . Stroke [facility])    no residual paralysis    Past Surgical History:  Procedure Laterality Date  . COLONOSCOPY  2000  . HYSTEROSCOPY W/D&C N/A 04/19/2013   Procedure: DILATATION AND CURETTAGE /HYSTEROSCOPY;  Surgeon: Emily Filbert, MD;  Location: [facility];  Service: Gynecology;  Laterality: N/A;    There were no vitals filed for this visit.  Subjective Assessment - 08/05/18 0941    Subjective  Pt states she is not really having any pain only soreness in her Rt buttock region.      Currently in Pain?  No/denies                       [facility] Adult PT Treatment/Exercise - 08/05/18 0001      Exercises   Exercises  Lumbar      Lumbar Exercises: Stretches   Active Hamstring Stretch  Right;Left;2 reps;30 seconds    Active Hamstring Stretch Limitations  supine with towel    Prone on Elbows Stretch  Limitations    Prone on Elbows Stretch Limitations  3 minutes      Lumbar Exercises: Standing   Heel Raises  15 reps    Heel Raises Limitations  toeraises 15 reps     Functional Squats  15 reps    Forward Lunge  10 reps;Limitations    Forward Lunge Limitations  onto 4" step without UE assist     Other Standing Lumbar Exercises  hip abd and ext 10 reps each in good posture      Lumbar Exercises: Supine   Ab Set  15 reps    Clam  15 reps    Bent Knee Raise  15 reps    Bridge  15 reps    Straight Leg Raise  10 reps      Lumbar Exercises: Prone   Other Prone Lumbar Exercises  heel squeeze x 15     Other Prone Lumbar Exercises  POE 3 minutes               PT Short Term Goals - 07/30/18 1112      PT SHORT TERM GOAL #1   Title  PT to pain to go no further than her right hip to demonstrate decreased nerve irritation     Time  2    Period  Weeks  Status  On-going      PT SHORT TERM GOAL #2   Title  PT pain to be no greater than a 5/10 to allow pt to stand for 15 minutes in comfort to allow pt to shower, make small meals without increased pain.     Time  2    Period  Weeks    Status  On-going      PT SHORT TERM GOAL #3   Title  PT to be able to walk for 15 minutes without having increased pain to allow pt complete short shopping trips in comfort.     Status  On-going        PT Long Term Goals - 07/30/18 1128      PT LONG TERM GOAL #1   Title  PT to have no radicular pain to demonstrate no nerve irritation     Time  4    Period  Weeks    Status  On-going      PT LONG TERM GOAL #2   Title  Pt low back and hip ROM to improve to allow pt to be able to bend over to pick up an item off the floor in comfort.     Time  4    Period  Weeks    Status  On-going      PT LONG TERM GOAL #3   Title  PT Rt LE strength to be at least 4+/5 to allow pt to be able to come sit to stand from low lying levels with ease.     Time  4    Period  Weeks    Status  On-going      PT LONG TERM GOAL #4   Title  PT to be able to be on her feet for over an hour without increased back or leg pain to allow shopping and household tasks to be completed.     Time  4    Period  Weeks    Status  On-going            Plan - 08/05/18 1025    Clinical Impression Statement  contiued with focus on core, postural  and LE strengthening.  Began hip abduction/extension in standing today with cues for form and maintaining upright posturing.  Postural theraband strengthening completed as well with good form maintained.  Pt able to complete all othere therex with minimal cues and no complaints or issues expressed.    Rehab Potential  Good    PT Frequency  2x / week    PT Duration  4 weeks    PT Treatment/Interventions  ADLs/Self Care Home Management;Manual techniques;Patient/family education;Therapeutic activities;Therapeutic exercise    PT Next Visit Plan  Continue with core and LE strengthening as well as postural education. Begin sit to stands next session.     PT Home Exercise Plan  ab set, bridge, POE and standing extension ; 12/6:  heel raise; functional squat , cervicaland scapular retraction     Consulted and Agree with Plan of Care  Patient       Patient will benefit from skilled therapeutic intervention in order to improve the following deficits and impairments:  Decreased activity tolerance, Decreased balance, Decreased range of motion, Decreased strength, Difficulty walking, Obesity, Pain, Postural dysfunction  Visit Diagnosis: Acute right-sided low back pain with right-sided sciatica  Muscle weakness (generalized)     Problem List Patient Active Problem List   Diagnosis Date Noted  . Hypothyroidism 10/23/2017  .  Hyperlipidemia, mixed 08/11/2017  . CVA (cerebral vascular accident) ([place]) 08/11/2017  . Morbid obesity ([place]) 08/11/2017  . Vulvar cancer ([place]) 11/13/2011  . 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 History of CVA (cerebrovascular accident) 11/13/2011  . Post-menopausal bleeding 11/13/2011  . HIV INFECTION 09/08/2006  . CANDIDIASIS, VAGINAL 09/08/2006  . 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 PERIPHERAL NEUROPATHY 09/08/2006  .  Essential hypertension 09/08/2006  . REFLUX ESOPHAGITIS 09/08/2006  . OVARIAN CYST 09/08/2006  . DYSPLASIA, CERVIX NOS 09/08/2006  . FOLLICULITIS 21/74/7159  . LYMPHADENOPATHY 09/08/2006  . PAP SMEAR, ABNORMAL 10/09/2000   Teena Irani, PTA/CLT [phone]  Teena Irani 08/05/2018, 10:28 AM  [place], [place], [address] Phone: [phone]   Fax:  [phone]  Name: CARISMA TROUPE MRN: [id number] Date of Birth: 1950-11-10

## 2018-08-10 ENCOUNTER — Ambulatory Visit (HOSPITAL_COMMUNITY): Payer: Medicare Other | Admitting: Physical Therapy

## 2018-08-10 DIAGNOSIS — M6281 Muscle weakness (generalized): Secondary | ICD-10-CM

## 2018-08-10 DIAGNOSIS — M5441 Lumbago with sciatica, right side: Secondary | ICD-10-CM | POA: Diagnosis not present

## 2018-08-10 NOTE — Therapy (Signed)
[place], [place], [address] Phone: [phone]   Fax:  [phone]  Physical Therapy Treatment  Patient Details  Name: Laura Jimenez MRN: [id number] Date of Birth: 1951-01-19 Referring Provider (PT): Joni Fears    Encounter Date: 08/10/2018  PT End of Session - 08/10/18 1105    Visit Number  5    Number of Visits  8    Date for PT Re-Evaluation  08/27/18    Authorization Type  [employer] medicaid    PT Start Time  [phone]    PT Stop Time  1032    PT Time Calculation (min)  43 min    Activity Tolerance  Patient tolerated treatment well    Behavior During Therapy  [facility] for tasks assessed/performed       Past Medical History:  Diagnosis Date  . Cancer ([place])   . CVA (cerebral infarction)   . HIV infection ([place])   . HTN (hypertension)   . Hyperlipidemia   . Morbid obesity with BMI of 40.0-44.9, adult ([place])   . Postmenopausal bleeding   . Stroke [facility])    no residual paralysis    Past Surgical History:  Procedure Laterality Date  . COLONOSCOPY  2000  . HYSTEROSCOPY W/D&C N/A 04/19/2013   Procedure: DILATATION AND CURETTAGE /HYSTEROSCOPY;  Surgeon: Emily Filbert, MD;  Location: [facility];  Service: Gynecology;  Laterality: N/A;    There were no vitals filed for this visit.  Subjective Assessment - 08/10/18 0957    Subjective  Pt reports she is doing good.  did a little shopping yesterday and did well.      Currently in Pain?  No/denies                       [facility] Adult PT Treatment/Exercise - 08/10/18 0001      Exercises   Exercises  Lumbar      Lumbar Exercises: Stretches   Active Hamstring Stretch  Right;Left;2 reps;30 seconds    Active Hamstring Stretch Limitations  on 12" box    Prone on Elbows Stretch  Limitations    Prone on Elbows Stretch Limitations  3 minutes      Lumbar Exercises: Standing   Heel Raises  20 reps    Heel Raises Limitations  toeraises 20 reps    Functional  Squats  20 reps    Forward Lunge  Limitations;20 reps    Forward Lunge Limitations  onto 4" step without UE assist     Other Standing Lumbar Exercises  hip abd and ext 20 reps each in good posture      Lumbar Exercises: Supine   Ab Set  15 reps               PT Short Term Goals - 07/30/18 1112      PT SHORT TERM GOAL #1   Title  PT to pain to go no further than her right hip to demonstrate decreased nerve irritation     Time  2    Period  Weeks    Status  On-going      PT SHORT TERM GOAL #2   Title  PT pain to be no greater than a 5/10 to allow pt to stand for 15 minutes in comfort to allow pt to shower, make small meals without increased pain.     Time  2    Period  Weeks    Status  On-going      PT SHORT TERM GOAL #3   Title  PT to be able to walk for 15 minutes without having increased pain to allow pt complete short shopping trips in comfort.     Status  On-going        PT Long Term Goals - 07/30/18 1128      PT LONG TERM GOAL #1   Title  PT to have no radicular pain to demonstrate no nerve irritation     Time  4    Period  Weeks    Status  On-going      PT LONG TERM GOAL #2   Title  Pt low back and hip ROM to improve to allow pt to be able to bend over to pick up an item off the floor in comfort.     Time  4    Period  Weeks    Status  On-going      PT LONG TERM GOAL #3   Title  PT Rt LE strength to be at least 4+/5 to allow pt to be able to come sit to stand from low lying levels with ease.     Time  4    Period  Weeks    Status  On-going      PT LONG TERM GOAL #4   Title  PT to be able to be on her feet for over an hour without increased back or leg pain to allow shopping and household tasks to be completed.    Time  4    Period  Weeks    Status  On-going            Plan - 08/10/18 1105    Clinical Impression Statement  contiued with focus on improving core, posture and LE strength.  Pt able to complete most exericses with only minimal  cues for form and completing more slowly.  Able to increase reps today of therex.  No complaints or issues, continues to do well and improving.      Rehab Potential  Good    PT Frequency  2x / week    PT Duration  4 weeks    PT Treatment/Interventions  ADLs/Self Care Home Management;Manual techniques;Patient/family education;Therapeutic activities;Therapeutic exercise    PT Next Visit Plan  Continue with core and LE strengthening as well as postural education. Begin sit to stands next session.     PT Home Exercise Plan  ab set, bridge, POE and standing extension ; 12/6:  heel raise; functional squat , cervicaland scapular retraction     Consulted and Agree with Plan of Care  Patient       Patient will benefit from skilled therapeutic intervention in order to improve the following deficits and impairments:  Decreased activity tolerance, Decreased balance, Decreased range of motion, Decreased strength, Difficulty walking, Obesity, Pain, Postural dysfunction  Visit Diagnosis: Acute right-sided low back pain with right-sided sciatica  Muscle weakness (generalized)     Problem List Patient Active Problem List   Diagnosis Date Noted  . Hypothyroidism 10/23/2017  . Hyperlipidemia, mixed 08/11/2017  . CVA (cerebral vascular accident) ([place]) 08/11/2017  . Morbid obesity ([place]) 08/11/2017  . Vulvar cancer ([place]) 11/13/2011  . 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 History of CVA (cerebrovascular accident) 11/13/2011  . Post-menopausal bleeding 11/13/2011  . HIV INFECTION 09/08/2006  . CANDIDIASIS, VAGINAL 09/08/2006  . 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 PERIPHERAL NEUROPATHY 09/08/2006  . Essential hypertension 09/08/2006  . REFLUX  ESOPHAGITIS 09/08/2006  . OVARIAN CYST 09/08/2006  . DYSPLASIA, CERVIX NOS 09/08/2006  . FOLLICULITIS 16/05/9603  . LYMPHADENOPATHY 09/08/2006  . PAP SMEAR, ABNORMAL 10/09/2000   Teena Irani, PTA/CLT [phone]  Teena Irani 08/10/2018, 11:06 AM  [place], [place], [address] Phone: [phone]   Fax:  [phone]  Name: Laura Jimenez MRN: [id number] Date of Birth: 1950/09/22

## 2018-08-12 ENCOUNTER — Ambulatory Visit (HOSPITAL_COMMUNITY): Payer: Medicare Other | Admitting: Physical Therapy

## 2018-08-12 DIAGNOSIS — M6281 Muscle weakness (generalized): Secondary | ICD-10-CM | POA: Diagnosis not present

## 2018-08-12 DIAGNOSIS — M5441 Lumbago with sciatica, right side: Secondary | ICD-10-CM

## 2018-08-12 NOTE — Therapy (Signed)
[place], [place], [address] Phone: [phone]   Fax:  [phone]  Physical Therapy Treatment  Patient Details  Name: Laura Jimenez MRN: [id number] Date of Birth: May 03, 1951 Referring Provider (PT): Joni Fears    Encounter Date: 08/12/2018  PT End of Session - 08/12/18 1042    Visit Number  6    Number of Visits  8    Date for PT Re-Evaluation  08/27/18    Authorization Type  [employer]HC medicaid    PT Start Time  0906    PT Stop Time  0947    PT Time Calculation (min)  41 min    Activity Tolerance  Patient tolerated treatment well    Behavior During Therapy  [facility] for tasks assessed/performed       Past Medical History:  Diagnosis Date  . Cancer ([place])   . CVA (cerebral infarction)   . HIV infection ([place])   . HTN (hypertension)   . Hyperlipidemia   . Morbid obesity with BMI of 40.0-44.9, adult ([place])   . Postmenopausal bleeding   . Stroke [facility])    no residual paralysis    Past Surgical History:  Procedure Laterality Date  . COLONOSCOPY  2000  . HYSTEROSCOPY W/D&C N/A 04/19/2013   Procedure: DILATATION AND CURETTAGE /HYSTEROSCOPY;  Surgeon: Emily Filbert, MD;  Location: [facility];  Service: Gynecology;  Laterality: N/A;    There were no vitals filed for this visit.  Subjective Assessment - 08/12/18 0917    Subjective  Pt reports no pain just soreness allover.  States she plans on getting in the tub and soaking in epson salt to releave sore mm.     Currently in Pain?  No/denies                       [facility] Adult PT Treatment/Exercise - 08/12/18 0001      Exercises   Exercises  Lumbar      Lumbar Exercises: Stretches   Active Hamstring Stretch  Right;Left;2 reps;30 seconds    Active Hamstring Stretch Limitations  on 12" box      Lumbar Exercises: Standing   Heel Raises  20 reps    Heel Raises Limitations  toeraises 20 reps    Functional Squats  20 reps    Forward Lunge   Limitations;20 reps    Forward Lunge Limitations  onto 4" step without UE assist     Other Standing Lumbar Exercises  hip abd and ext 20 reps each in good posture      Lumbar Exercises: Seated   Sit to Stand  10 reps;Limitations    Sit to Stand Limitations  no UE assist      Lumbar Exercises: Supine   Bridge  15 reps    Straight Leg Raise  10 reps      Lumbar Exercises: Prone   Straight Leg Raise  10 reps    Other Prone Lumbar Exercises  heel squeeze x 15     Other Prone Lumbar Exercises  POE 3 minutes               PT Short Term Goals - 07/30/18 1112      PT SHORT TERM GOAL #1   Title  PT to pain to go no further than her right hip to demonstrate decreased nerve irritation     Time  2    Period  Weeks  Status  On-going      PT SHORT TERM GOAL #2   Title  PT pain to be no greater than a 5/10 to allow pt to stand for 15 minutes in comfort to allow pt to shower, make small meals without increased pain.     Time  2    Period  Weeks    Status  On-going      PT SHORT TERM GOAL #3   Title  PT to be able to walk for 15 minutes without having increased pain to allow pt complete short shopping trips in comfort.     Status  On-going        PT Long Term Goals - 07/30/18 1128      PT LONG TERM GOAL #1   Title  PT to have no radicular pain to demonstrate no nerve irritation     Time  4    Period  Weeks    Status  On-going      PT LONG TERM GOAL #2   Title  Pt low back and hip ROM to improve to allow pt to be able to bend over to pick up an item off the floor in comfort.     Time  4    Period  Weeks    Status  On-going      PT LONG TERM GOAL #3   Title  PT Rt LE strength to be at least 4+/5 to allow pt to be able to come sit to stand from low lying levels with ease.     Time  4    Period  Weeks    Status  On-going      PT LONG TERM GOAL #4   Title  PT to be able to be on her feet for over an hour without increased back or leg pain to allow shopping and  household tasks to be completed.    Time  4    Period  Weeks    Status  On-going            Plan - 08/12/18 1043    Clinical Impression Statement  continued with focus on improving posture, core and LE strength. Improving form with cues to complete exericises more slowly with core stability.   Began sit to stands today with postural cues.  No pain or issues during or following therapy.     Rehab Potential  Good    PT Frequency  2x / week    PT Duration  4 weeks    PT Treatment/Interventions  ADLs/Self Care Home Management;Manual techniques;Patient/family education;Therapeutic activities;Therapeutic exercise    PT Next Visit Plan  Continue with core and LE strengthening as well as postural education. Begin sit to stands next session.     PT Home Exercise Plan  ab set, bridge, POE and standing extension ; 12/6:  heel raise; functional squat , cervicaland scapular retraction     Consulted and Agree with Plan of Care  Patient       Patient will benefit from skilled therapeutic intervention in order to improve the following deficits and impairments:  Decreased activity tolerance, Decreased balance, Decreased range of motion, Decreased strength, Difficulty walking, Obesity, Pain, Postural dysfunction  Visit Diagnosis: Acute right-sided low back pain with right-sided sciatica  Muscle weakness (generalized)     Problem List Patient Active Problem List   Diagnosis Date Noted  . Hypothyroidism 10/23/2017  . Hyperlipidemia, mixed 08/11/2017  . CVA (cerebral vascular accident) ([place])  08/11/2017  . Morbid obesity ([place]) 08/11/2017  . Vulvar cancer ([place]) 11/13/2011  . 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 History of CVA (cerebrovascular accident) 11/13/2011  . Post-menopausal bleeding 11/13/2011  . HIV INFECTION 09/08/2006  . CANDIDIASIS, VAGINAL 09/08/2006  . 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 PERIPHERAL NEUROPATHY 09/08/2006  . Essential hypertension 09/08/2006  . REFLUX ESOPHAGITIS  09/08/2006  . OVARIAN CYST 09/08/2006  . DYSPLASIA, CERVIX NOS 09/08/2006  . FOLLICULITIS 62/19/4712  . LYMPHADENOPATHY 09/08/2006  . PAP SMEAR, ABNORMAL 10/09/2000   Teena Irani, PTA/CLT [phone]  Teena Irani 08/12/2018, 10:47 AM  [place], [place], [address] Phone: [phone]   Fax:  [phone]  Name: SUMI LYE MRN: [id number] Date of Birth: 10-21-1950

## 2018-08-16 ENCOUNTER — Telehealth (HOSPITAL_COMMUNITY): Payer: Self-pay | Admitting: Physical Therapy

## 2018-08-16 ENCOUNTER — Ambulatory Visit (HOSPITAL_COMMUNITY): Payer: Medicare Other | Admitting: Physical Therapy

## 2018-08-16 NOTE — Telephone Encounter (Signed)
Pt did not show for appointment.  Spoke to patient who thought it was tomorrow instead of today.  Pt reminded of next app on 12/26 @ [place], PTA/CLT [phone]

## 2018-08-19 ENCOUNTER — Ambulatory Visit (HOSPITAL_COMMUNITY): Payer: Medicare Other | Admitting: Physical Therapy

## 2018-08-19 DIAGNOSIS — M6281 Muscle weakness (generalized): Secondary | ICD-10-CM

## 2018-08-19 DIAGNOSIS — M5441 Lumbago with sciatica, right side: Secondary | ICD-10-CM | POA: Diagnosis not present

## 2018-08-19 NOTE — Therapy (Signed)
[facility] [address] [place], [place], [address] Phone: [phone]   Fax:  [phone]  Physical Therapy Treatment  Patient Details  Name: Laura Jimenez MRN: [id number] Date of Birth: May 10, 1951 Referring Provider (PT): Joni Fears    Encounter Date: 08/19/2018  PT End of Session - 08/19/18 0952    Visit Number  7    Number of Visits  8    Date for PT Re-Evaluation  08/27/18    Authorization Type  [employer]HC medicaid    PT Start Time  0909    PT Stop Time  0950    PT Time Calculation (min)  41 min    Activity Tolerance  Patient tolerated treatment well    Behavior During Therapy  [facility] for tasks assessed/performed       Past Medical History:  Diagnosis Date  . Cancer ([place])   . CVA (cerebral infarction)   . HIV infection ([place])   . HTN (hypertension)   . Hyperlipidemia   . Morbid obesity with BMI of 40.0-44.9, adult ([place])   . Postmenopausal bleeding   . Stroke [facility])    no residual paralysis    Past Surgical History:  Procedure Laterality Date  . COLONOSCOPY  2000  . HYSTEROSCOPY W/D&C N/A 04/19/2013   Procedure: DILATATION AND CURETTAGE /HYSTEROSCOPY;  Surgeon: Emily Filbert, MD;  Location: [facility];  Service: Gynecology;  Laterality: N/A;    There were no vitals filed for this visit.  Subjective Assessment - 08/19/18 0928    Subjective  Pt states she did alot shopping Christmas Eve and her back started hurting.  Currently not hurting.    Currently in Pain?  No/denies                       [facility] Adult PT Treatment/Exercise - 08/19/18 0001      Lumbar Exercises: Stretches   Active Hamstring Stretch  Right;Left;2 reps;30 seconds    Active Hamstring Stretch Limitations  on 12" box      Lumbar Exercises: Standing   Heel Raises  20 reps    Heel Raises Limitations  toeraises 20 reps    Functional Squats  20 reps    Forward Lunge  Limitations;20 reps    Forward Lunge Limitations  onto 4" step without UE  assist     Other Standing Lumbar Exercises  hip abd and ext 20 reps each in good posture      Lumbar Exercises: Seated   Sit to Stand  10 reps;Limitations    Sit to Stand Limitations  no UE assist      Lumbar Exercises: Prone   Straight Leg Raise  10 reps    Other Prone Lumbar Exercises  heel squeeze x 15     Other Prone Lumbar Exercises  POE 3 minutes               PT Short Term Goals - 07/30/18 1112      PT SHORT TERM GOAL #1   Title  PT to pain to go no further than her right hip to demonstrate decreased nerve irritation     Time  2    Period  Weeks    Status  On-going      PT SHORT TERM GOAL #2   Title  PT pain to be no greater than a 5/10 to allow pt to stand for 15 minutes in comfort to allow pt to shower, make small  meals without increased pain.     Time  2    Period  Weeks    Status  On-going      PT SHORT TERM GOAL #3   Title  PT to be able to walk for 15 minutes without having increased pain to allow pt complete short shopping trips in comfort.     Status  On-going        PT Long Term Goals - 07/30/18 1128      PT LONG TERM GOAL #1   Title  PT to have no radicular pain to demonstrate no nerve irritation     Time  4    Period  Weeks    Status  On-going      PT LONG TERM GOAL #2   Title  Pt low back and hip ROM to improve to allow pt to be able to bend over to pick up an item off the floor in comfort.     Time  4    Period  Weeks    Status  On-going      PT LONG TERM GOAL #3   Title  PT Rt LE strength to be at least 4+/5 to allow pt to be able to come sit to stand from low lying levels with ease.     Time  4    Period  Weeks    Status  On-going      PT LONG TERM GOAL #4   Title  PT to be able to be on her feet for over an hour without increased back or leg pain to allow shopping and household tasks to be completed.    Time  4    Period  Weeks    Status  On-going            Plan - 08/19/18 0956    Clinical Impression Statement  Focus  remains on improving posture core and LE strength.  Pt needed minimal cues with therex, mainly with squats and standing hip exercises for posturing.  pt remained symptom free at end of session.     Rehab Potential  Good    PT Frequency  2x / week    PT Duration  4 weeks    PT Treatment/Interventions  ADLs/Self Care Home Management;Manual techniques;Patient/family education;Therapeutic activities;Therapeutic exercise    PT Next Visit Plan  Continue with core and LE strengthening as well as postural education. Re-evaluate next session.     PT Home Exercise Plan  ab set, bridge, POE and standing extension ; 12/6:  heel raise; functional squat , cervicaland scapular retraction     Consulted and Agree with Plan of Care  Patient       Patient will benefit from skilled therapeutic intervention in order to improve the following deficits and impairments:  Decreased activity tolerance, Decreased balance, Decreased range of motion, Decreased strength, Difficulty walking, Obesity, Pain, Postural dysfunction  Visit Diagnosis: Acute right-sided low back pain with right-sided sciatica  Muscle weakness (generalized)     Problem List Patient Active Problem List   Diagnosis Date Noted  . Hypothyroidism 10/23/2017  . Hyperlipidemia, mixed 08/11/2017  . CVA (cerebral vascular accident) ([place]) 08/11/2017  . Morbid obesity ([place]) 08/11/2017  . Vulvar cancer ([place]) 11/13/2011  . 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 History of CVA (cerebrovascular accident) 11/13/2011  . Post-menopausal bleeding 11/13/2011  . HIV INFECTION 09/08/2006  . CANDIDIASIS, VAGINAL 09/08/2006  . 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 PERIPHERAL NEUROPATHY 09/08/2006  .  Essential hypertension 09/08/2006  . REFLUX ESOPHAGITIS 09/08/2006  . OVARIAN CYST 09/08/2006  . DYSPLASIA, CERVIX NOS 09/08/2006  . FOLLICULITIS 56/25/6389  . LYMPHADENOPATHY 09/08/2006  . PAP SMEAR, ABNORMAL 10/09/2000   Teena Irani,  PTA/CLT [phone]  Teena Irani 08/19/2018, 9:58 AM  [facility] [place], [place], [address] Phone: [phone]   Fax:  [phone]  Name: Laura Jimenez MRN: [id number] Date of Birth: 07/18/1951

## 2018-08-23 ENCOUNTER — Encounter (HOSPITAL_COMMUNITY): Payer: Self-pay | Admitting: Physical Therapy

## 2018-08-23 ENCOUNTER — Ambulatory Visit (HOSPITAL_COMMUNITY): Payer: Medicare Other | Admitting: Physical Therapy

## 2018-08-23 DIAGNOSIS — M5441 Lumbago with sciatica, right side: Secondary | ICD-10-CM | POA: Diagnosis not present

## 2018-08-23 DIAGNOSIS — M6281 Muscle weakness (generalized): Secondary | ICD-10-CM | POA: Diagnosis not present

## 2018-08-23 NOTE — Therapy (Signed)
[facility] [address] [place], [place], [address] Phone: [phone]   Fax:  [phone]  Physical Therapy Treatment/Discharge  Patient Details  Name: Laura Jimenez MRN: [id number] Date of Birth: 1951/08/05 Referring Provider (PT): Joni Fears  PHYSICAL THERAPY DISCHARGE SUMMARY  Visits from Start of Care: 8  Current functional level related to goals / functional outcomes: See below   Remaining deficits: none   Education / Equipment: Using core when completing lifting, body mechanics and the importance of completing balance activity and walking  Plan: Patient agrees to discharge.  Patient goals were met. Patient is being discharged due to meeting the stated rehab goals.  ?????       Encounter Date: 08/23/2018  PT End of Session - 08/23/18 0848    Visit Number  8    Number of Visits  8    Date for PT Re-Evaluation  08/27/18    Authorization Type  [employer] medicaid    PT Start Time  0820    PT Stop Time  0848    PT Time Calculation (min)  28 min    Activity Tolerance  Patient tolerated treatment well    Behavior During Therapy  WFL for tasks assessed/performed       Past Medical History:  Diagnosis Date  . Cancer ([place])   . CVA (cerebral infarction)   . HIV infection ([place])   . HTN (hypertension)   . Hyperlipidemia   . Morbid obesity with BMI of 40.0-44.9, adult ([place])   . Postmenopausal bleeding   . Stroke [facility])    no residual paralysis    Past Surgical History:  Procedure Laterality Date  . COLONOSCOPY  2000  . HYSTEROSCOPY W/D&C N/A 04/19/2013   Procedure: DILATATION AND CURETTAGE /HYSTEROSCOPY;  Surgeon: Emily Filbert, MD;  Location: [facility];  Service: Gynecology;  Laterality: N/A;    There were no vitals filed for this visit.  Subjective Assessment - 08/23/18 0826    Subjective  PT states she is doing much better.   No pain     How long can you sit comfortably?  no problem     How long can you stand  comfortably?  no problem     How long can you walk comfortably?  no problem     Currently in Pain?  No/denies         [facility] PT Assessment - 08/23/18 0001      Assessment   Medical Diagnosis   low back pain    Referring Provider (PT)  Joni Fears     Onset Date/Surgical Date  06/25/18    Prior Therapy  none      Precautions   Precautions  None      Restrictions   Weight Bearing Restrictions  No      Balance Screen   Has the patient fallen in the past 6 months  No    Has the patient had a decrease in activity level because of a fear of falling?   --   back to normal    Is the patient reluctant to leave their home because of a fear of falling?   No      Prior Function   Level of Independence  Independent    Vocation  Retired      Associate Professor   Overall Cognitive Status  Within Functional Limits for tasks assessed      Observation/Other Assessments   Focus on Therapeutic Outcomes (FOTO)  83 was 47      Functional Tests   Functional tests  Single leg stance;Sit to Stand      Single Leg Stance   Comments  Rt:52" was  11";   LT  39" was 22"      Sit to Stand   Comments  5 x 11.63 was  20.45       Posture/Postural Control   Posture/Postural Control  Postural limitations    Postural Limitations  Rounded Shoulders;Forward head;Decreased thoracic kyphosis    Posture Comments  PT tends to lean on her Y ligament       AROM   Lumbar Flexion  decreased 25% was 50% with immediate radicular sx     Lumbar Extension  30   reps no change      Strength   Right Hip Flexion  5/5   was 4+/5    Right Hip Extension  5/5    Right Hip ABduction  5/5    Left Hip Flexion  5/5   was 4+/5    Left Hip Extension  4+/5   was 3/5    Left Hip ABduction  5/5    Right Knee Flexion  5/5   was 3+   Right Knee Extension  5/5    Left Knee Flexion  5/5    Left Knee Extension  5/5    Right Ankle Dorsiflexion  5/5    Left Ankle Dorsiflexion  5/5      Flexibility   Soft Tissue  Assessment /Muscle Length  yes    Hamstrings  LT160 was 153; RT 158 was  153      Ambulation/Gait   Ambulation Distance (Feet)  688 Feet   was 625   Assistive device  None    Gait Comments  3'                             PT Short Term Goals - 08/23/18 0840      PT SHORT TERM GOAL #1   Title  PT to pain to go no further than her right hip to demonstrate decreased nerve irritation     Time  2    Period  Weeks    Status  Achieved      PT SHORT TERM GOAL #2   Title  PT pain to be no greater than a 5/10 to allow pt to stand for 15 minutes in comfort to allow pt to shower, make small meals without increased pain.     Time  2    Period  Weeks    Status  Achieved      PT SHORT TERM GOAL #3   Title  PT to be able to walk for 15 minutes without having increased pain to allow pt complete short shopping trips in comfort.     Status  Achieved        PT Long Term Goals - 08/23/18 0840      PT LONG TERM GOAL #1   Title  PT to have no radicular pain to demonstrate no nerve irritation     Time  4    Period  Weeks    Status  Achieved      PT LONG TERM GOAL #2   Title  Pt low back and hip ROM to improve to allow pt to be able to bend over to pick up an item off the floor in comfort.  Time  4    Period  Weeks    Status  Achieved      PT LONG TERM GOAL #3   Title  PT Rt LE strength to be at least 4+/5 to allow pt to be able to come sit to stand from low lying levels with ease.     Time  4    Period  Weeks    Status  Achieved      PT LONG TERM GOAL #4   Title  PT to be able to be on her feet for over an hour without increased back or leg pain to allow shopping and household tasks to be completed.    Time  4    Period  Weeks    Status  Achieved            Plan - 08/23/18 0848    Clinical Impression Statement  Pt reassessed with noted improvement in all areas.  All goals have been met; therapist and patient agree to discharge at this time.  Therapist  urged pt to work on balance and walk at least 15 minutes daily.  PT verbalized understandoing     Clinical Presentation  Stable    Rehab Potential  Good    PT Frequency  2x / week    PT Duration  4 weeks    PT Treatment/Interventions  ADLs/Self Care Home Management;Manual techniques;Patient/family education;Therapeutic activities;Therapeutic exercise    PT Next Visit Plan  Discharge.     PT Home Exercise Plan  ab set, bridge, POE and standing extension ; 12/6:  heel raise; functional squat , cervicaland scapular retraction     Consulted and Agree with Plan of Care  Patient       Patient will benefit from skilled therapeutic intervention in order to improve the following deficits and impairments:  Decreased activity tolerance, Decreased balance, Decreased range of motion, Decreased strength, Difficulty walking, Obesity, Pain, Postural dysfunction  Visit Diagnosis: Acute right-sided low back pain with right-sided sciatica  Muscle weakness (generalized)     Problem List Patient Active Problem List   Diagnosis Date Noted  . Hypothyroidism 10/23/2017  . Hyperlipidemia, mixed 08/11/2017  . CVA (cerebral vascular accident) ([place]) 08/11/2017  . Morbid obesity ([place]) 08/11/2017  . Vulvar cancer ([place]) 11/13/2011  . 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 History of CVA (cerebrovascular accident) 11/13/2011  . Post-menopausal bleeding 11/13/2011  . HIV INFECTION 09/08/2006  . CANDIDIASIS, VAGINAL 09/08/2006  . 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 PERIPHERAL NEUROPATHY 09/08/2006  . Essential hypertension 09/08/2006  . REFLUX ESOPHAGITIS 09/08/2006  . OVARIAN CYST 09/08/2006  . DYSPLASIA, CERVIX NOS 09/08/2006  . FOLLICULITIS 28/20/8138  . LYMPHADENOPATHY 09/08/2006  . PAP SMEAR, ABNORMAL 10/09/2000   Rayetta Humphrey, PT CLT [phone] 08/23/2018, 8:51 AM  [facility] [address] [place], [place], [address] Phone: [phone]   Fax:   [phone]  Name: Laura Jimenez MRN: [id number] Date of Birth: 01-Apr-1951

## 2018-08-27 DIAGNOSIS — H524 Presbyopia: Secondary | ICD-10-CM | POA: Diagnosis not present

## 2018-08-27 DIAGNOSIS — H52223 Regular astigmatism, bilateral: Secondary | ICD-10-CM | POA: Diagnosis not present

## 2018-10-06 ENCOUNTER — Encounter (INDEPENDENT_AMBULATORY_CARE_PROVIDER_SITE_OTHER): Payer: Self-pay | Admitting: Orthopaedic Surgery

## 2018-10-06 ENCOUNTER — Ambulatory Visit (INDEPENDENT_AMBULATORY_CARE_PROVIDER_SITE_OTHER): Payer: Medicare Other

## 2018-10-06 ENCOUNTER — Ambulatory Visit (INDEPENDENT_AMBULATORY_CARE_PROVIDER_SITE_OTHER): Payer: Medicare Other | Admitting: Orthopaedic Surgery

## 2018-10-06 VITALS — BP 138/74 | HR 74 | Ht 64.0 in | Wt 251.0 lb

## 2018-10-06 DIAGNOSIS — M25512 Pain in left shoulder: Secondary | ICD-10-CM

## 2018-10-06 MED ORDER — LIDOCAINE HCL 2 % IJ SOLN
2.0000 mL | INTRAMUSCULAR | Status: AC | PRN
  Administered 2018-10-06: 2 mL

## 2018-10-06 MED ORDER — BUPIVACAINE HCL 0.5 % IJ SOLN
2.0000 mL | INTRAMUSCULAR | Status: AC | PRN
  Administered 2018-10-06: 2 mL via INTRA_ARTICULAR

## 2018-10-06 MED ORDER — METHYLPREDNISOLONE ACETATE 40 MG/ML IJ SUSP
80.0000 mg | INTRAMUSCULAR | Status: AC | PRN
  Administered 2018-10-06: 80 mg

## 2018-10-06 NOTE — Progress Notes (Signed)
Office Visit Note   Patient: Laura Jimenez           Date of Birth: 1950/09/04           MRN: [id number] Visit Date: 10/06/2018              Requested by: Caren Macadam, [place], [place] [address] PCP: Caren Macadam, MD   Assessment & Plan: Visit Diagnoses:  1. 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  Will inject left shoulder joint with cortisone and monitor response  Follow-Up Instructions: Return if symptoms worsen or fail to improve.   Orders:  Orders Placed This Encounter  Procedures  . Large Joint Inj: L glenohumeral  . XR Shoulder Left   No orders of the defined types were placed in this encounter.     Procedures: Large Joint Inj: L glenohumeral on 10/06/2018 10:05 AM Indications: pain and diagnostic evaluation Details: 25 G 1.5 in needle, anterolateral approach  Arthrogram: No  Medications: 2 mL bupivacaine 0.5 %; 2 mL lidocaine 2 %; 80 mg methylPREDNISolone acetate 40 MG/ML Consent was given by the patient. Immediately prior to procedure a time out was called to verify the correct patient, procedure, equipment, support staff and site/side marked as required. Patient was prepped and draped in the usual sterile fashion.       Clinical Data: No additional findings.   Subjective: Chief Complaint  Patient presents with  . Left Shoulder - Pain  Patient presents today with left shoulder pain X 2weeks. No known injury. She has constant pain 10/10. She has had injections in the past that helped. She is taking tylenol, "pain cream", and patches.  Seen several or more years ago for evaluation of left shoulder pain and did well with cortisone.  No injury or trauma.  Having some difficulty raising her arm over her head  HPI  Review of Systems   Objective: Vital Signs: BP 138/74   Pulse 74   Ht 5\' 4"  (1.626 m)   Wt 251 lb (113.9  kg)   BMI 43.08 kg/m   Physical Exam Constitutional:      Appearance: She is well-developed.  Eyes:     Pupils: Pupils are equal, round, and reactive to light.  Pulmonary:     Effort: Pulmonary effort is normal.  Skin:    General: Skin is warm and dry.  Neurological:     Mental Status: She is alert and oriented to person, place, and time.  Psychiatric:        Behavior: Behavior normal.     Ortho Exam awake alert and oriented x3.  Comfortable sitting.  I can place the left arm just about fully overhead but with a circuitous arc of motion.  Pain with internal and external rotation but no grating.  Little loss of external rotation.  No pain about the [facility] joint or the anterior subacromial region.  Large arms.  Skin intact.  Good grip and good release.  No instability Specialty Comments:  No specialty comments available.  Imaging: Xr Shoulder Left  Result Date: 10/06/2018 Films of the left shoulder were obtained 2 projections.  There is a small humeral head spur consistent with osteoarthritis.  The joint space is well-maintained.  Good space between the humeral head and the acromion.  There is an inferiorly directed acromial spur and some degenerative changes at the  AC joint.  No acute changes    PMFS History: Patient Active Problem List   Diagnosis Date Noted  . Acute pain of left shoulder 10/06/2018  . Hypothyroidism 10/23/2017  . Hyperlipidemia, mixed 08/11/2017  . CVA (cerebral vascular accident) ([place]) 08/11/2017  . Morbid obesity ([place]) 08/11/2017  . Vulvar cancer ([place]) 11/13/2011  . 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 History of CVA (cerebrovascular accident) 11/13/2011  . Post-menopausal bleeding 11/13/2011  . HIV INFECTION 09/08/2006  . CANDIDIASIS, VAGINAL 09/08/2006  . 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 PERIPHERAL NEUROPATHY 09/08/2006  . Essential hypertension 09/08/2006  . REFLUX ESOPHAGITIS 09/08/2006  . OVARIAN CYST 09/08/2006  . DYSPLASIA, CERVIX NOS 09/08/2006    . FOLLICULITIS 09/47/0962  . LYMPHADENOPATHY 09/08/2006  . PAP SMEAR, ABNORMAL 10/09/2000   Past Medical History:  Diagnosis Date  . Cancer ([place])   . CVA (cerebral infarction)   . HIV infection ([place])   . HTN (hypertension)   . Hyperlipidemia   . Morbid obesity with BMI of 40.0-44.9, adult ([place])   . Postmenopausal bleeding   . Stroke [facility])    no residual paralysis    Family History  Problem Relation Age of Onset  . Hypertension Mother   . Hypertension Sister   . Arthritis Sister   . Cancer Other   . Diabetes Other     Past Surgical History:  Procedure Laterality Date  . COLONOSCOPY  2000  . HYSTEROSCOPY W/D&C N/A 04/19/2013   Procedure: DILATATION AND CURETTAGE /HYSTEROSCOPY;  Surgeon: Emily Filbert, MD;  Location: [facility];  Service: Gynecology;  Laterality: N/A;   Social History   Occupational History  . Not on file  Tobacco Use  . Smoking status: Former Smoker    Packs/day: 1.00    Years: 5.00    Pack years: 5.00    Types: Cigarettes  . Smokeless tobacco: Never Used  Substance and Sexual Activity  . Alcohol use: No  . Drug use: No  . Sexual activity: Not Currently    Birth control/protection: None, Post-menopausal

## 2018-10-15 DIAGNOSIS — K219 Gastro-esophageal reflux disease without esophagitis: Secondary | ICD-10-CM | POA: Diagnosis not present

## 2018-10-22 DIAGNOSIS — T560X1A Toxic effect of lead and its compounds, accidental (unintentional), initial encounter: Secondary | ICD-10-CM | POA: Diagnosis not present

## 2018-11-01 DIAGNOSIS — R5383 Other fatigue: Secondary | ICD-10-CM | POA: Diagnosis not present

## 2018-11-01 DIAGNOSIS — T733XXD Exhaustion due to excessive exertion, subsequent encounter: Secondary | ICD-10-CM | POA: Diagnosis not present

## 2018-11-01 DIAGNOSIS — M19071 Primary osteoarthritis, right ankle and foot: Secondary | ICD-10-CM | POA: Diagnosis not present

## 2018-11-01 DIAGNOSIS — T560X1D Toxic effect of lead and its compounds, accidental (unintentional), subsequent encounter: Secondary | ICD-10-CM | POA: Diagnosis not present

## 2018-11-01 DIAGNOSIS — M2550 Pain in unspecified joint: Secondary | ICD-10-CM | POA: Diagnosis not present

## 2018-11-01 DIAGNOSIS — M109 Gout, unspecified: Secondary | ICD-10-CM | POA: Diagnosis not present

## 2019-01-12 ENCOUNTER — Other Ambulatory Visit (HOSPITAL_COMMUNITY): Payer: Medicare Other

## 2019-01-19 ENCOUNTER — Ambulatory Visit (HOSPITAL_COMMUNITY): Payer: Medicare Other | Admitting: Hematology

## 2019-01-19 DIAGNOSIS — E039 Hypothyroidism, unspecified: Secondary | ICD-10-CM | POA: Diagnosis not present

## 2019-01-19 DIAGNOSIS — E782 Mixed hyperlipidemia: Secondary | ICD-10-CM | POA: Diagnosis not present

## 2019-01-19 DIAGNOSIS — K219 Gastro-esophageal reflux disease without esophagitis: Secondary | ICD-10-CM | POA: Diagnosis not present

## 2019-04-27 DIAGNOSIS — Z23 Encounter for immunization: Secondary | ICD-10-CM | POA: Diagnosis not present

## 2019-04-27 DIAGNOSIS — Z79899 Other long term (current) drug therapy: Secondary | ICD-10-CM | POA: Diagnosis not present

## 2019-08-29 DIAGNOSIS — Z79899 Other long term (current) drug therapy: Secondary | ICD-10-CM | POA: Diagnosis not present

## 2019-10-09 ENCOUNTER — Emergency Department (HOSPITAL_COMMUNITY): Payer: Medicare Other

## 2019-10-09 ENCOUNTER — Emergency Department (HOSPITAL_COMMUNITY)
Admission: EM | Admit: 2019-10-09 | Discharge: 2019-10-09 | Disposition: A | Discharge status: Home / Self Care | Payer: Medicare Other | Attending: Emergency Medicine | Admitting: Emergency Medicine

## 2019-10-09 ENCOUNTER — Encounter (HOSPITAL_COMMUNITY): Payer: Self-pay | Admitting: Emergency Medicine

## 2019-10-09 ENCOUNTER — Other Ambulatory Visit: Payer: Self-pay

## 2019-10-09 DIAGNOSIS — Z7982 Long term (current) use of aspirin: Secondary | ICD-10-CM | POA: Diagnosis not present

## 2019-10-09 DIAGNOSIS — Z91040 Latex allergy status: Secondary | ICD-10-CM | POA: Diagnosis not present

## 2019-10-09 DIAGNOSIS — G510 Bell's palsy: Secondary | ICD-10-CM | POA: Diagnosis not present

## 2019-10-09 DIAGNOSIS — Z79899 Other long term (current) drug therapy: Secondary | ICD-10-CM | POA: Insufficient documentation

## 2019-10-09 DIAGNOSIS — I10 Essential (primary) hypertension: Secondary | ICD-10-CM | POA: Diagnosis not present

## 2019-10-09 DIAGNOSIS — Z87891 Personal history of nicotine dependence: Secondary | ICD-10-CM | POA: Insufficient documentation

## 2019-10-09 DIAGNOSIS — I160 Hypertensive urgency: Secondary | ICD-10-CM | POA: Diagnosis not present

## 2019-10-09 DIAGNOSIS — E039 Hypothyroidism, unspecified: Secondary | ICD-10-CM | POA: Diagnosis not present

## 2019-10-09 DIAGNOSIS — R270 Ataxia, unspecified: Secondary | ICD-10-CM | POA: Diagnosis not present

## 2019-10-09 DIAGNOSIS — Z21 Asymptomatic human immunodeficiency virus [HIV] infection status: Secondary | ICD-10-CM | POA: Insufficient documentation

## 2019-10-09 DIAGNOSIS — H5712 Ocular pain, left eye: Secondary | ICD-10-CM | POA: Diagnosis present

## 2019-10-09 LAB — CBC WITH DIFFERENTIAL/PLATELET
Abs Immature Granulocytes: 0.02 10*3/uL (ref 0.00–0.07)
Basophils Absolute: 0.1 10*3/uL (ref 0.0–0.1)
Basophils Relative: 1 %
Eosinophils Absolute: 0.1 10*3/uL (ref 0.0–0.5)
Eosinophils Relative: 1 %
HCT: 37.9 % (ref 36.0–46.0)
Hemoglobin: 11.6 g/dL — ABNORMAL LOW (ref 12.0–15.0)
Immature Granulocytes: 0 %
Lymphocytes Relative: 57 %
Lymphs Abs: 4 10*3/uL (ref 0.7–4.0)
MCH: 29.6 pg (ref 26.0–34.0)
MCHC: 30.6 g/dL (ref 30.0–36.0)
MCV: 96.7 fL (ref 80.0–100.0)
Monocytes Absolute: 0.7 10*3/uL (ref 0.1–1.0)
Monocytes Relative: 10 %
Neutro Abs: 2.1 10*3/uL (ref 1.7–7.7)
Neutrophils Relative %: 31 %
Platelets: 257 10*3/uL (ref 150–400)
RBC: 3.92 MIL/uL (ref 3.87–5.11)
RDW: 13.8 % (ref 11.5–15.5)
WBC: 6.9 10*3/uL (ref 4.0–10.5)
nRBC: 0 % (ref 0.0–0.2)

## 2019-10-09 LAB — BASIC METABOLIC PANEL
Anion gap: 7 (ref 5–15)
BUN: 9 mg/dL (ref 8–23)
CO2: 27 mmol/L (ref 22–32)
Calcium: 9 mg/dL (ref 8.9–10.3)
Chloride: 104 mmol/L (ref 98–111)
Creatinine, Ser: 0.91 mg/dL (ref 0.44–1.00)
GFR calc Af Amer: >60 mL/min (ref >60)
GFR calc non Af Amer: >60 mL/min (ref >60)
Glucose, Bld: 90 mg/dL (ref 70–99)
Potassium: 3.6 mmol/L (ref 3.5–5.1)
Sodium: 138 mmol/L (ref 135–145)

## 2019-10-09 MED ORDER — PREDNISONE 10 MG PO TABS: 20.0000 mg | ORAL_TABLET | Freq: Two times a day (BID) | ORAL | 0 refills | Status: DC

## 2019-10-09 MED ORDER — CLONIDINE HCL 0.1 MG PO TABS: 0.1000 mg | ORAL_TABLET | Freq: Two times a day (BID) | ORAL | 1 refills | Status: DC

## 2019-10-09 MED ORDER — CLONIDINE 0.3 MG/24HR TD PTWK: 0.3000 mg | MEDICATED_PATCH | TRANSDERMAL | 1 refills | Status: DC

## 2019-10-09 MED ORDER — VALACYCLOVIR HCL 1 G PO TABS: 1000.0000 mg | ORAL_TABLET | Freq: Three times a day (TID) | ORAL | 0 refills | Status: DC

## 2019-10-09 NOTE — ED Triage Notes (Signed)
Pt C/o burning in her left eye that started this morning.

## 2019-10-09 NOTE — Discharge Instructions (Signed)
Begin taking prednisone and Valtrex as prescribed.  Use Lacri-Lube to your left eye to prevent it from drying out and developing an abrasion.  This medication is available over-the-counter.  Resume your clonidine as previously prescribed.  Your prescription has been refilled.  You are to follow-up with a primary doctor for additional refills.  Return to the emergency department if symptoms significantly worsen or change.

## 2019-10-09 NOTE — ED Provider Notes (Signed)
[facility] EMERGENCY DEPARTMENT Provider Note   CSN: [id number] Arrival date & time: 10/09/19  2110     History Chief Complaint  Patient presents with  . Eye Problem    Laura Jimenez is a 69 y.o. female.  Patient is a 69 year old female with history of HIV disease, hypertension, prior CVA.  She presents today for evaluation of burning and irritation to her left eye.  This apparently began this morning.  She denies to me she is experiencing any headache, weakness, or numbness of her face, arms, or legs.  She denies any visual disturbances.  The history is provided by the patient.  Eye Problem Location:  Left eye Quality:  Burning Severity:  Moderate Onset quality:  Sudden Duration:  12 hours Timing:  Constant Progression:  Worsening Chronicity:  New Relieved by:  Nothing Worsened by:  Nothing      Past Medical History:  Diagnosis Date  . Cancer ([place])   . CVA (cerebral infarction)   . HIV infection ([place])   . HTN (hypertension)   . Hyperlipidemia   . Morbid obesity with BMI of 40.0-44.9, adult ([place])   . Postmenopausal bleeding   . Stroke [facility])    no residual paralysis    Patient Active Problem List   Diagnosis Date Noted  . Acute pain of left shoulder 10/06/2018  . Hypothyroidism 10/23/2017  . Hyperlipidemia, mixed 08/11/2017  . CVA (cerebral vascular accident) ([place]) 08/11/2017  . Morbid obesity ([place]) 08/11/2017  . Vulvar cancer ([place]) 11/13/2011  . 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 History of CVA (cerebrovascular accident) 11/13/2011  . Post-menopausal bleeding 11/13/2011  . HIV INFECTION 09/08/2006  . CANDIDIASIS, VAGINAL 09/08/2006  . 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 PERIPHERAL NEUROPATHY 09/08/2006  . Essential hypertension 09/08/2006  . REFLUX ESOPHAGITIS 09/08/2006  . OVARIAN CYST 09/08/2006  . DYSPLASIA, CERVIX NOS 09/08/2006  . FOLLICULITIS 99991111  . LYMPHADENOPATHY 09/08/2006  . PAP SMEAR, ABNORMAL 10/09/2000    Past Surgical  History:  Procedure Laterality Date  . COLONOSCOPY  2000  . HYSTEROSCOPY WITH D & C N/A 04/19/2013   Procedure: DILATATION AND CURETTAGE /HYSTEROSCOPY;  Surgeon: Emily Filbert, MD;  Location: [facility] [facility];  Service: Gynecology;  Laterality: N/A;     OB History    Gravida  3   Para  2   Term  2   Preterm      AB  1   Living        SAB  1   TAB      Ectopic      Multiple      Live Births              Family History  Problem Relation Age of Onset  . Hypertension Mother   . Hypertension Sister   . Arthritis Sister   . Cancer Other   . Diabetes Other     Social History   Tobacco Use  . Smoking status: Former Smoker    Packs/day: 1.00    Years: 5.00    Pack years: 5.00    Types: Cigarettes  . Smokeless tobacco: Never Used  Substance Use Topics  . Alcohol use: No  . Drug use: No    Home Medications Prior to Admission medications   Medication Sig Start Date End Date Taking? Authorizing Provider  darunavir (PREZISTA) 600 MG tablet TAKE ONE TABLET BY MOUTH TWICE DAILY WITH MEALS. 10/05/19  Yes [provider]  dolutegravir (TIVICAY) 50 MG tablet TAKE 1  TABLET BY MOUTH ONCE A DAY. 10/05/19  Yes [provider]  aspirin (PX ENTERIC ASPIRIN) 81 MG EC tablet Take 1 tablet (81 mg total) by mouth daily. Swallow whole. 09/25/17   Caren Macadam, MD  atorvastatin (LIPITOR) 20 MG tablet Take 0.5 tablets (10 mg total) by mouth daily. 04/09/18   Caren Macadam, MD  Bioflavonoid Products (VITAMIN C) CHEW Chew 1 tablet by mouth daily.    [provider]  Cholecalciferol (VITAMIN D) 2000 units CAPS Take by mouth.    [provider]  cloNIDine (CATAPRES - DOSED IN MG/24 HR) 0.3 mg/24hr patch Place 1 patch (0.3 mg total) onto the skin once a week. 11/26/17   Caren Macadam, MD  darunavir (PREZISTA) 600 MG tablet Take 600 mg by mouth 2 (two) times daily with a meal.    [provider]  doravirine (PIFELTRO) 100 MG TABS tablet Take by mouth.  09/28/17   [provider]  levothyroxine (SYNTHROID, LEVOTHROID) 50 MCG tablet Take 1 tablet (50 mcg total) by mouth daily. 08/27/17   Caren Macadam, MD  lisinopril (PRINIVIL,ZESTRIL) 40 MG tablet Take 1 tablet (40 mg total) by mouth daily. 08/27/17   Caren Macadam, MD  naproxen (NAPROSYN) 375 MG tablet Take 1 tablet (375 mg total) by mouth 2 (two) times daily as needed for moderate pain. Patient not taking: Reported on 07/15/2018 03/28/18   Wurst, [place], PA-C  naproxen (NAPROSYN) 500 MG tablet Take 1 tablet (500 mg total) by mouth 2 (two) times daily with a meal. Patient not taking: Reported on 07/15/2018 05/13/18   Marybelle Killings, MD  ranitidine (ZANTAC) 300 MG tablet Take by mouth. 03/04/17   [provider]  ritonavir (NORVIR) 100 MG TABS Take by mouth 2 (two) times daily with a meal.    [provider]  TIVICAY 50 MG tablet  09/15/18   [provider]  traZODone (DESYREL) 50 MG tablet Take 0.5-1 tablets (25-50 mg total) by mouth at bedtime as needed for sleep. 05/07/18   Caren Macadam, MD  triamterene-hydrochlorothiazide (MAXZIDE-25) 37.5-25 MG tablet Take 1 tablet by mouth daily. 08/27/17   Caren Macadam, MD  vitamin B-12 (CYANOCOBALAMIN) 1000 MCG tablet Take 1 tablet (1,000 mcg total) by mouth daily. 08/27/17   Caren Macadam, MD    Allergies    Ivp dye [iodinated diagnostic agents], Tape, Cephalexin, and Latex  Review of Systems   Review of Systems  All other systems reviewed and are negative.   Physical Exam Updated Vital Signs BP (!) 189/94 (BP Location: Right Arm)   Pulse 76   Temp 98.1 F (36.7 C) (Oral)   Resp 18   Wt 113.9 kg   SpO2 96%   BMI 43.10 kg/m   Physical Exam Vitals and nursing note reviewed.  Constitutional:      General: She is not in acute distress.    Appearance: She is well-developed. She is not diaphoretic.  HENT:     Head: Normocephalic and atraumatic.  Eyes:     Extraocular Movements: Extraocular movements intact.       Pupils: Pupils are equal, round, and reactive to light.     Comments: Patient with incomplete closure of the left eyelid.  There is no discharge, and no conjunctival or corneal abnormality.  Cardiovascular:     Rate and Rhythm: Normal rate and regular rhythm.     Heart sounds: No murmur. No friction rub. No gallop.   Pulmonary:     Effort: Pulmonary effort is normal.  No respiratory distress.     Breath sounds: Normal breath sounds. No wheezing.  Abdominal:     General: Bowel sounds are normal. There is no distension.     Palpations: Abdomen is soft.     Tenderness: There is no abdominal tenderness.  Musculoskeletal:        General: Normal range of motion.     Cervical back: Normal range of motion and neck supple.  Skin:    General: Skin is warm and dry.  Neurological:     Mental Status: She is alert and oriented to person, place, and time.     Cranial Nerves: Cranial nerve deficit present.     Sensory: No sensory deficit.     Motor: No weakness.     Comments: Patient has a left facial nerve palsy noted.  Her smile is asymmetrical with left side droop.  She also has involvement of the forehead.  She is noted to have incomplete closure of the left eye.  Cranial nerves are otherwise intact.     ED Results / Procedures / Treatments   Labs (all labs ordered are listed, but only abnormal results are displayed) Labs Reviewed  CBC WITH DIFFERENTIAL/PLATELET - Abnormal; Notable for the following components:      Result Value   Hemoglobin 11.6 (*)    All other components within normal limits  BASIC METABOLIC PANEL    EKG None  Radiology CT Head Wo Contrast  Result Date: 10/09/2019 CLINICAL DATA:  Ataxia.  Burning of the left eye. EXAM: CT HEAD WITHOUT CONTRAST TECHNIQUE: Contiguous axial images were obtained from the base of the skull through the vertex without intravenous contrast. COMPARISON:  None. FINDINGS: Brain: The brain shows a normal appearance without evidence of  malformation, atrophy, old or acute small or large vessel infarction, mass lesion, hemorrhage, hydrocephalus or extra-axial collection. Vascular: No hyperdense vessel. No evidence of atherosclerotic calcification. Skull: Normal.  No traumatic finding.  No focal bone lesion. Sinuses/Orbits: Sinuses are clear. Orbits appear normal. Mastoids are clear. Other: None significant IMPRESSION: Normal head CT. Electronically Signed   By: Nelson Chimes M.D.   On: 10/09/2019 22:24    Procedures Procedures (including critical care time)  Medications Ordered in ED Medications - No data to display  ED Course  I have reviewed the triage vital signs and the nursing notes.  Pertinent labs & imaging results that were available during my care of the patient were reviewed by me and considered in my medical decision making (see chart for details).    MDM Rules/Calculators/A&P  Patient presents here with complaints of burning and irritation to her left eye.  On presentation, she has an obvious left cranial nerve VII palsy.  Her head CT is negative and laboratory studies are otherwise unremarkable.  She has no other deficits.  At this point, I am quite certain this is a Bell's palsy.  She will be treated with steroids and antivirals.    Patient is also concerned about her blood pressure.  She has been off of her antihypertensives for nearly 1 year after her primary doctor left town.  Her clonidine will be refilled.  She is to obtain a primary doctor for additional prescription refills.  Final Clinical Impression(s) / ED Diagnoses Final diagnoses:  None    Rx / DC Orders ED Discharge Orders    None       Veryl Speak, MD 10/09/19 2247

## 2019-10-18 DIAGNOSIS — I10 Essential (primary) hypertension: Secondary | ICD-10-CM | POA: Diagnosis not present

## 2019-10-18 DIAGNOSIS — Z1159 Encounter for screening for other viral diseases: Secondary | ICD-10-CM | POA: Diagnosis not present

## 2019-10-18 DIAGNOSIS — Z1231 Encounter for screening mammogram for malignant neoplasm of breast: Secondary | ICD-10-CM | POA: Diagnosis not present

## 2019-10-18 DIAGNOSIS — D649 Anemia, unspecified: Secondary | ICD-10-CM | POA: Diagnosis not present

## 2019-10-18 DIAGNOSIS — Z0000 Encounter for general adult medical examination without abnormal findings: Secondary | ICD-10-CM | POA: Diagnosis not present

## 2019-10-18 DIAGNOSIS — K219 Gastro-esophageal reflux disease without esophagitis: Secondary | ICD-10-CM | POA: Diagnosis not present

## 2019-10-18 DIAGNOSIS — E782 Mixed hyperlipidemia: Secondary | ICD-10-CM | POA: Diagnosis not present

## 2019-10-21 ENCOUNTER — Other Ambulatory Visit (HOSPITAL_COMMUNITY): Payer: Self-pay | Admitting: Family Medicine

## 2019-10-21 DIAGNOSIS — Z1231 Encounter for screening mammogram for malignant neoplasm of breast: Secondary | ICD-10-CM

## 2019-10-27 ENCOUNTER — Other Ambulatory Visit: Payer: Self-pay

## 2019-10-27 ENCOUNTER — Ambulatory Visit (HOSPITAL_COMMUNITY)
Admission: RE | Admit: 2019-10-27 | Discharge: 2019-10-27 | Disposition: A | Discharge status: Home / Self Care | Payer: Medicare Other | Source: Ambulatory Visit | Attending: Family Medicine | Admitting: Family Medicine

## 2019-10-27 DIAGNOSIS — Z1231 Encounter for screening mammogram for malignant neoplasm of breast: Secondary | ICD-10-CM | POA: Diagnosis not present

## 2019-10-31 ENCOUNTER — Other Ambulatory Visit: Payer: Self-pay

## 2019-10-31 ENCOUNTER — Ambulatory Visit
Admission: EM | Admit: 2019-10-31 | Discharge: 2019-10-31 | Disposition: A | Discharge status: Home / Self Care | Payer: Medicare Other | Attending: Emergency Medicine | Admitting: Emergency Medicine

## 2019-10-31 DIAGNOSIS — A084 Viral intestinal infection, unspecified: Secondary | ICD-10-CM

## 2019-10-31 MED ORDER — ONDANSETRON HCL 4 MG PO TABS: 4.0000 mg | ORAL_TABLET | Freq: Three times a day (TID) | ORAL | 0 refills | Status: DC | PRN

## 2019-10-31 MED ORDER — DICYCLOMINE HCL 20 MG PO TABS: 20.0000 mg | ORAL_TABLET | Freq: Two times a day (BID) | ORAL | 0 refills | Status: DC

## 2019-10-31 NOTE — ED Provider Notes (Signed)
[facility]    CSN: [id number] Arrival date & time: 10/31/19  1537      History   Chief Complaint Chief Complaint  Patient presents with  . Emesis  . Diarrhea    HPI Laura Jimenez is a 69 y.o. female.   Female who presented to the urgent care for complaint of vomiting and diarrhea for the past 2 days.  She denies a precipitating event, or eating anything unusual.  States no one around her has similar symptoms.  She has vomiting 1 time.  Denies blood in her vomit, but most consistent with water or food she has eaten.  Patient has had multiple episodes of diarrhea.  Report none today.  Has not tried any medication.  Her symptom are made worst with eating.  She denies fever, chills, nausea, abdominal pain, melena, hematochezia.     Past Medical History:  Diagnosis Date  . Cancer ([place])   . CVA (cerebral infarction)   . HIV infection ([place])   . HTN (hypertension)   . Hyperlipidemia   . Morbid obesity with BMI of 40.0-44.9, adult ([place])   . Postmenopausal bleeding   . Stroke [facility])    no residual paralysis    Patient Active Problem List   Diagnosis Date Noted  . Acute pain of left shoulder 10/06/2018  . Hypothyroidism 10/23/2017  . Hyperlipidemia, mixed 08/11/2017  . CVA (cerebral vascular accident) ([place]) 08/11/2017  . Morbid obesity ([place]) 08/11/2017  . Vulvar cancer ([place]) 11/13/2011  . 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 History of CVA (cerebrovascular accident) 11/13/2011  . Post-menopausal bleeding 11/13/2011  . HIV INFECTION 09/08/2006  . CANDIDIASIS, VAGINAL 09/08/2006  . 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 PERIPHERAL NEUROPATHY 09/08/2006  . Essential hypertension 09/08/2006  . REFLUX ESOPHAGITIS 09/08/2006  . OVARIAN CYST 09/08/2006  . DYSPLASIA, CERVIX NOS 09/08/2006  . FOLLICULITIS 99991111  . LYMPHADENOPATHY 09/08/2006  . PAP SMEAR, ABNORMAL 10/09/2000    Past Surgical History:  Procedure Laterality Date  . COLONOSCOPY  2000  . HYSTEROSCOPY  WITH D & C N/A 04/19/2013   Procedure: DILATATION AND CURETTAGE /HYSTEROSCOPY;  Surgeon: Emily Filbert, MD;  Location: [place] [facility];  Service: Gynecology;  Laterality: N/A;    OB History    Gravida  3   Para  2   Term  2   Preterm      AB  1   Living        SAB  1   TAB      Ectopic      Multiple      Live Births               Home Medications    Prior to Admission medications   Medication Sig Start Date End Date Taking? Authorizing Provider  aspirin (PX ENTERIC ASPIRIN) 81 MG EC tablet Take 1 tablet (81 mg total) by mouth daily. Swallow whole. 09/25/17   Caren Macadam, MD  atorvastatin (LIPITOR) 20 MG tablet Take 0.5 tablets (10 mg total) by mouth daily. 04/09/18   Caren Macadam, MD  Bioflavonoid Products (VITAMIN C) CHEW Chew 1 tablet by mouth daily.    [provider]  Cholecalciferol (VITAMIN D) 2000 units CAPS Take by mouth.    [provider]  cloNIDine (CATAPRES) 0.1 MG tablet Take 1 tablet (0.1 mg total) by mouth 2 (two) times daily. 10/09/19   Veryl Speak, MD  darunavir (PREZISTA) 600 MG tablet Take 600 mg by mouth 2 (two) times daily with  a meal.    [provider]  darunavir (PREZISTA) 600 MG tablet TAKE ONE TABLET BY MOUTH TWICE DAILY WITH MEALS. 10/05/19   [provider]  dicyclomine (BENTYL) 20 MG tablet Take 1 tablet (20 mg total) by mouth 2 (two) times daily. 10/31/19   Jaydan Meidinger, Darrelyn Hillock, FNP  dolutegravir (TIVICAY) 50 MG tablet TAKE 1 TABLET BY MOUTH ONCE A DAY. 10/05/19   [provider]  doravirine (PIFELTRO) 100 MG TABS tablet Take by mouth. 09/28/17   [provider]  levothyroxine (SYNTHROID, LEVOTHROID) 50 MCG tablet Take 1 tablet (50 mcg total) by mouth daily. 08/27/17   Caren Macadam, MD  lisinopril (PRINIVIL,ZESTRIL) 40 MG tablet Take 1 tablet (40 mg total) by mouth daily. 08/27/17   Caren Macadam, MD  naproxen (NAPROSYN) 375 MG tablet Take 1 tablet (375 mg total) by mouth 2 (two) times daily as needed  for moderate pain. Patient not taking: Reported on 07/15/2018 03/28/18   Wurst, [place], PA-C  naproxen (NAPROSYN) 500 MG tablet Take 1 tablet (500 mg total) by mouth 2 (two) times daily with a meal. Patient not taking: Reported on 07/15/2018 05/13/18   Marybelle Killings, MD  ondansetron (ZOFRAN) 4 MG tablet Take 1 tablet (4 mg total) by mouth every 8 (eight) hours as needed for nausea or vomiting. 10/31/19   Victorious Kundinger, Darrelyn Hillock, FNP  predniSONE (DELTASONE) 10 MG tablet Take 2 tablets (20 mg total) by mouth 2 (two) times daily. 10/09/19   Veryl Speak, MD  ranitidine (ZANTAC) 300 MG tablet Take by mouth. 03/04/17   [provider]  ritonavir (NORVIR) 100 MG TABS Take by mouth 2 (two) times daily with a meal.    [provider]  TIVICAY 50 MG tablet  09/15/18   [provider]  traZODone (DESYREL) 50 MG tablet Take 0.5-1 tablets (25-50 mg total) by mouth at bedtime as needed for sleep. 05/07/18   Caren Macadam, MD  triamterene-hydrochlorothiazide (MAXZIDE-25) 37.5-25 MG tablet Take 1 tablet by mouth daily. 08/27/17   Caren Macadam, MD  valACYclovir (VALTREX) 1000 MG tablet Take 1 tablet (1,000 mg total) by mouth 3 (three) times daily. 10/09/19   Veryl Speak, MD  vitamin B-12 (CYANOCOBALAMIN) 1000 MCG tablet Take 1 tablet (1,000 mcg total) by mouth daily. 08/27/17   Caren Macadam, MD    Family History Family History  Problem Relation Age of Onset  . Hypertension Mother   . Hypertension Sister   . Arthritis Sister   . Cancer Other   . Diabetes Other     Social History Social History   Tobacco Use  . Smoking status: Former Smoker    Packs/day: 1.00    Years: 5.00    Pack years: 5.00    Types: Cigarettes  . Smokeless tobacco: Never Used  Substance Use Topics  . Alcohol use: No  . Drug use: No     Allergies   Ivp dye [iodinated diagnostic agents], Penicillins, Tape, Cephalexin, and Latex   Review of Systems Review of Systems  Constitutional: Negative.    Respiratory: Negative.   Cardiovascular: Negative.   Gastrointestinal: Positive for diarrhea and vomiting.     Physical Exam Triage Vital Signs ED Triage Vitals  Enc Vitals Group     BP 10/31/19 1547 (!) 179/93     Pulse Rate 10/31/19 1547 78     Resp 10/31/19 1547 18     Temp 10/31/19 1547 98.1 F (36.7 C)     Temp src --  SpO2 10/31/19 1547 97 %     Weight --      Height --      Head Circumference --      Peak Flow --      Pain Score 10/31/19 1544 4     Pain Loc --      Pain Edu? --      Excl. in [place]? --    No data found.  Updated Vital Signs BP (!) 179/93   Pulse 78   Temp 98.1 F (36.7 C)   Resp 18   SpO2 97%   Visual Acuity Right Eye Distance:   Left Eye Distance:   Bilateral Distance:    Right Eye Near:   Left Eye Near:    Bilateral Near:     Physical Exam Vitals and nursing note reviewed.  Constitutional:      General: She is not in acute distress.    Appearance: Normal appearance. She is normal weight. She is not ill-appearing, toxic-appearing or diaphoretic.  Cardiovascular:     Rate and Rhythm: Normal rate and regular rhythm.     Pulses: Normal pulses.     Heart sounds: Normal heart sounds. No murmur. No gallop.   Pulmonary:     Effort: Pulmonary effort is normal. No respiratory distress.     Breath sounds: Normal breath sounds. No stridor. No wheezing, rhonchi or rales.  Chest:     Chest wall: No tenderness.  Abdominal:     General: Abdomen is flat. Bowel sounds are normal. There is no distension.     Palpations: Abdomen is soft. There is no mass.     Tenderness: There is no abdominal tenderness. There is no right CVA tenderness, left CVA tenderness, guarding or rebound.     Hernia: No hernia is present.  Neurological:     Mental Status: She is alert.      UC Treatments / Results  Labs (all labs ordered are listed, but only abnormal results are displayed) Labs Reviewed - No data to display  EKG   Radiology No results  found.  Procedures Procedures (including critical care time)  Medications Ordered in UC Medications - No data to display  Initial Impression / Assessment and Plan / UC Course  I have reviewed the triage vital signs and the nursing notes.  Pertinent labs & imaging results that were available during my care of the patient were reviewed by me and considered in my medical decision making (see chart for details).     Patient is stable for discharge. Bentyl was prescribed Zofran was prescribed Advised patient to follow-up with primary care To go to ED for worsening symptoms   Final Clinical Impressions(s) / UC Diagnoses   Final diagnoses:  Viral gastroenteritis     Discharge Instructions     Get rest and drink fluids Zofran prescribed.  Take as directed.   Bentyl prescribed take as directed Increase your fluid intake to replace losses. Clear liquids only for 24 hours (water, tea, sport drinks, clear flat ginger ale or cola and juices, broth, jello, popsicles, ect). Advance to bland foods, applesauce, rice, baked or boiled chicken, ect. Avoid milk, greasy foods and anything that doesn't agree with you.  If you experience new or worsening symptoms return or go to ER such as fever, chills, nausea, vomiting, diarrhea, bloody or dark tarry stools, constipation, urinary symptoms, worsening abdominal discomfort, symptoms that do not improve with medications, inability to keep fluids down, etc...  Reviewed expectations re:  course of current medical issues. Questions answered. Outlined signs and symptoms indicating need for more acute intervention. Patient verbalized understanding. After Visit Summary given.    ED Prescriptions    Medication Sig Dispense Auth. Provider   dicyclomine (BENTYL) 20 MG tablet Take 1 tablet (20 mg total) by mouth 2 (two) times daily. 20 tablet Ilianna Bown S, FNP   ondansetron (ZOFRAN) 4 MG tablet Take 1 tablet (4 mg total) by mouth every 8 (eight)  hours as needed for nausea or vomiting. 20 tablet Cedric Denison, Darrelyn Hillock, FNP     PDMP not reviewed this encounter.   Emerson Monte, FNP 10/31/19 1627

## 2019-10-31 NOTE — Discharge Instructions (Addendum)
Get rest and drink fluids Zofran prescribed.  Take as directed.   Bentyl prescribed take as directed Increase your fluid intake to replace losses. Clear liquids only for 24 hours (water, tea, sport drinks, clear flat ginger ale or cola and juices, broth, jello, popsicles, ect). Advance to bland foods, applesauce, rice, baked or boiled chicken, ect. Avoid milk, greasy foods and anything that doesnt agree with you.  If you experience new or worsening symptoms return or go to ER such as fever, chills, nausea, vomiting, diarrhea, bloody or dark tarry stools, constipation, urinary symptoms, worsening abdominal discomfort, symptoms that do not improve with medications, inability to keep fluids down, etc...  Reviewed expectations re: course of current medical issues. Questions answered. Outlined signs and symptoms indicating need for more acute intervention. Patient verbalized understanding. After Visit Summary given.

## 2019-10-31 NOTE — ED Triage Notes (Signed)
Pt presents with c/o vomiting and diarrhea that began on Saturday , last vomited yesterday

## 2019-11-02 DIAGNOSIS — G510 Bell's palsy: Secondary | ICD-10-CM | POA: Diagnosis not present

## 2019-11-07 ENCOUNTER — Other Ambulatory Visit: Payer: Self-pay | Admitting: Ophthalmology

## 2019-11-08 ENCOUNTER — Other Ambulatory Visit: Payer: Self-pay | Admitting: Ophthalmology

## 2019-11-08 DIAGNOSIS — G510 Bell's palsy: Secondary | ICD-10-CM

## 2019-11-15 DIAGNOSIS — Z8719 Personal history of other diseases of the digestive system: Secondary | ICD-10-CM | POA: Diagnosis not present

## 2019-11-15 DIAGNOSIS — K219 Gastro-esophageal reflux disease without esophagitis: Secondary | ICD-10-CM | POA: Diagnosis not present

## 2019-11-15 DIAGNOSIS — I10 Essential (primary) hypertension: Secondary | ICD-10-CM | POA: Diagnosis not present

## 2019-11-15 DIAGNOSIS — E039 Hypothyroidism, unspecified: Secondary | ICD-10-CM | POA: Diagnosis not present

## 2019-11-15 DIAGNOSIS — E782 Mixed hyperlipidemia: Secondary | ICD-10-CM | POA: Diagnosis not present

## 2019-11-16 DIAGNOSIS — G510 Bell's palsy: Secondary | ICD-10-CM | POA: Diagnosis not present

## 2019-11-25 DIAGNOSIS — G510 Bell's palsy: Secondary | ICD-10-CM | POA: Diagnosis not present

## 2019-11-25 DIAGNOSIS — H0279 Other degenerative disorders of eyelid and periocular area: Secondary | ICD-10-CM | POA: Diagnosis not present

## 2019-11-25 DIAGNOSIS — H16212 Exposure keratoconjunctivitis, left eye: Secondary | ICD-10-CM | POA: Diagnosis not present

## 2019-11-25 DIAGNOSIS — H02234 Paralytic lagophthalmos left upper eyelid: Secondary | ICD-10-CM | POA: Diagnosis not present

## 2019-12-05 ENCOUNTER — Ambulatory Visit
Admission: RE | Admit: 2019-12-05 | Discharge: 2019-12-05 | Disposition: A | Discharge status: Home / Self Care | Payer: Medicare Other | Source: Ambulatory Visit | Attending: Ophthalmology | Admitting: Ophthalmology

## 2019-12-05 DIAGNOSIS — G510 Bell's palsy: Secondary | ICD-10-CM

## 2019-12-05 MED ORDER — GADOBENATE DIMEGLUMINE 529 MG/ML IV SOLN
20.0000 mL | Freq: Once | INTRAVENOUS | Status: AC | PRN
  Administered 2019-12-05: 20 mL via INTRAVENOUS

## 2019-12-14 DIAGNOSIS — G510 Bell's palsy: Secondary | ICD-10-CM | POA: Diagnosis not present

## 2020-01-03 DIAGNOSIS — R1310 Dysphagia, unspecified: Secondary | ICD-10-CM | POA: Diagnosis not present

## 2020-01-03 DIAGNOSIS — R1314 Dysphagia, pharyngoesophageal phase: Secondary | ICD-10-CM | POA: Diagnosis not present

## 2020-01-03 DIAGNOSIS — Z79899 Other long term (current) drug therapy: Secondary | ICD-10-CM | POA: Diagnosis not present

## 2020-01-12 DIAGNOSIS — H189 Unspecified disorder of cornea: Secondary | ICD-10-CM | POA: Diagnosis not present

## 2020-01-12 DIAGNOSIS — G510 Bell's palsy: Secondary | ICD-10-CM | POA: Diagnosis not present

## 2020-01-16 DIAGNOSIS — E039 Hypothyroidism, unspecified: Secondary | ICD-10-CM | POA: Diagnosis not present

## 2020-01-16 DIAGNOSIS — I10 Essential (primary) hypertension: Secondary | ICD-10-CM | POA: Diagnosis not present

## 2020-01-16 DIAGNOSIS — E782 Mixed hyperlipidemia: Secondary | ICD-10-CM | POA: Diagnosis not present

## 2020-02-13 DIAGNOSIS — I10 Essential (primary) hypertension: Secondary | ICD-10-CM | POA: Diagnosis not present

## 2020-02-13 DIAGNOSIS — J069 Acute upper respiratory infection, unspecified: Secondary | ICD-10-CM | POA: Diagnosis not present

## 2020-02-23 DIAGNOSIS — I10 Essential (primary) hypertension: Secondary | ICD-10-CM | POA: Diagnosis not present

## 2020-05-21 ENCOUNTER — Other Ambulatory Visit: Payer: Self-pay | Admitting: Sports Medicine

## 2020-05-21 DIAGNOSIS — M4310 Spondylolisthesis, site unspecified: Secondary | ICD-10-CM

## 2020-05-21 DIAGNOSIS — M5416 Radiculopathy, lumbar region: Secondary | ICD-10-CM | POA: Diagnosis not present

## 2020-05-21 DIAGNOSIS — E559 Vitamin D deficiency, unspecified: Secondary | ICD-10-CM | POA: Diagnosis not present

## 2020-05-21 DIAGNOSIS — T560X1A Toxic effect of lead and its compounds, accidental (unintentional), initial encounter: Secondary | ICD-10-CM | POA: Diagnosis not present

## 2020-05-21 DIAGNOSIS — M48061 Spinal stenosis, lumbar region without neurogenic claudication: Secondary | ICD-10-CM

## 2020-05-21 DIAGNOSIS — M5410 Radiculopathy, site unspecified: Secondary | ICD-10-CM

## 2020-05-21 DIAGNOSIS — M25551 Pain in right hip: Secondary | ICD-10-CM | POA: Diagnosis not present

## 2020-05-21 DIAGNOSIS — M25512 Pain in left shoulder: Secondary | ICD-10-CM | POA: Diagnosis not present

## 2020-05-28 DIAGNOSIS — Z23 Encounter for immunization: Secondary | ICD-10-CM | POA: Diagnosis not present

## 2020-05-28 DIAGNOSIS — M109 Gout, unspecified: Secondary | ICD-10-CM | POA: Diagnosis not present

## 2020-05-28 DIAGNOSIS — E559 Vitamin D deficiency, unspecified: Secondary | ICD-10-CM | POA: Diagnosis not present

## 2020-05-28 DIAGNOSIS — R221 Localized swelling, mass and lump, neck: Secondary | ICD-10-CM | POA: Diagnosis not present

## 2020-05-28 DIAGNOSIS — M810 Age-related osteoporosis without current pathological fracture: Secondary | ICD-10-CM | POA: Diagnosis not present

## 2020-05-31 ENCOUNTER — Other Ambulatory Visit: Payer: Self-pay | Admitting: Family Medicine

## 2020-05-31 DIAGNOSIS — R221 Localized swelling, mass and lump, neck: Secondary | ICD-10-CM

## 2020-06-04 ENCOUNTER — Other Ambulatory Visit: Payer: Self-pay | Admitting: *Deleted

## 2020-06-04 ENCOUNTER — Other Ambulatory Visit (HOSPITAL_COMMUNITY)
Admission: RE | Admit: 2020-06-04 | Discharge: 2020-06-04 | Disposition: A | Discharge status: Home / Self Care | Payer: Medicare Other | Source: Ambulatory Visit | Attending: Internal Medicine | Admitting: Internal Medicine

## 2020-06-04 ENCOUNTER — Other Ambulatory Visit: Payer: Self-pay

## 2020-06-04 ENCOUNTER — Other Ambulatory Visit: Payer: Medicare Other

## 2020-06-04 ENCOUNTER — Encounter: Payer: Self-pay | Admitting: Internal Medicine

## 2020-06-04 ENCOUNTER — Ambulatory Visit: Payer: Medicare Other

## 2020-06-04 DIAGNOSIS — B20 Human immunodeficiency virus [HIV] disease: Secondary | ICD-10-CM | POA: Diagnosis present

## 2020-06-04 DIAGNOSIS — Z79899 Other long term (current) drug therapy: Secondary | ICD-10-CM

## 2020-06-04 DIAGNOSIS — Z113 Encounter for screening for infections with a predominantly sexual mode of transmission: Secondary | ICD-10-CM

## 2020-06-05 LAB — URINALYSIS
Bilirubin Urine: NEGATIVE
Glucose, UA: NEGATIVE
Hgb urine dipstick: NEGATIVE
Ketones, ur: NEGATIVE
Nitrite: NEGATIVE
Specific Gravity, Urine: 1.016 (ref 1.001–1.03)
pH: 7.5 (ref 5.0–8.0)

## 2020-06-05 LAB — T-HELPER CELL (CD4) - (RCID CLINIC ONLY)
CD4 % Helper T Cell: 13 % — ABNORMAL LOW (ref 33–65)
CD4 T Cell Abs: 261 /uL — ABNORMAL LOW (ref 400–1790)

## 2020-06-05 LAB — URINE CYTOLOGY ANCILLARY ONLY
Chlamydia: NEGATIVE
Comment: NEGATIVE
Comment: NORMAL
Neisseria Gonorrhea: NEGATIVE

## 2020-06-14 ENCOUNTER — Ambulatory Visit
Admission: RE | Admit: 2020-06-14 | Discharge: 2020-06-14 | Disposition: A | Discharge status: Home / Self Care | Payer: Medicare Other | Source: Ambulatory Visit | Attending: Family Medicine | Admitting: Family Medicine

## 2020-06-14 ENCOUNTER — Other Ambulatory Visit: Payer: Self-pay

## 2020-06-14 DIAGNOSIS — E010 Iodine-deficiency related diffuse (endemic) goiter: Secondary | ICD-10-CM | POA: Diagnosis not present

## 2020-06-14 DIAGNOSIS — R221 Localized swelling, mass and lump, neck: Secondary | ICD-10-CM

## 2020-06-15 ENCOUNTER — Telehealth: Payer: Self-pay

## 2020-06-15 NOTE — Telephone Encounter (Addendum)
[facility] Patient Teacher, English as a foreign language completed.    The patient is insured through [employer].  I processed each of the medications that is on her profile and they ill all be a $0.00 Copay.  [name], [facility] Patient [facility] Phone: [phone] Fax:  [phone]

## 2020-06-18 LAB — CBC WITH DIFFERENTIAL/PLATELET
Absolute Monocytes: 400 cells/uL (ref 200–950)
Basophils Absolute: 48 cells/uL (ref 0–200)
Basophils Relative: 1.1 %
Eosinophils Absolute: 62 cells/uL (ref 15–500)
Eosinophils Relative: 1.4 %
HCT: 35.7 % (ref 35.0–45.0)
Hemoglobin: 11.6 g/dL — ABNORMAL LOW (ref 11.7–15.5)
Lymphs Abs: 2204 cells/uL (ref 850–3900)
MCH: 30.3 pg (ref 27.0–33.0)
MCHC: 32.5 g/dL (ref 32.0–36.0)
MCV: 93.2 fL (ref 80.0–100.0)
MPV: 10.3 fL (ref 7.5–12.5)
Monocytes Relative: 9.1 %
Neutro Abs: 1685 cells/uL (ref 1500–7800)
Neutrophils Relative %: 38.3 %
Platelets: 139 10*3/uL — ABNORMAL LOW (ref 140–400)
RBC: 3.83 10*6/uL (ref 3.80–5.10)
RDW: 13.3 % (ref 11.0–15.0)
Total Lymphocyte: 50.1 %
WBC: 4.4 10*3/uL (ref 3.8–10.8)

## 2020-06-18 LAB — COMPLETE METABOLIC PANEL WITH GFR
AG Ratio: 0.7 (calc) — ABNORMAL LOW (ref 1.0–2.5)
ALT: 9 U/L (ref 6–29)
AST: 17 U/L (ref 10–35)
Albumin: 3.5 g/dL — ABNORMAL LOW (ref 3.6–5.1)
Alkaline phosphatase (APISO): 68 U/L (ref 37–153)
BUN: 12 mg/dL (ref 7–25)
CO2: 29 mmol/L (ref 20–32)
Calcium: 9.4 mg/dL (ref 8.6–10.4)
Chloride: 105 mmol/L (ref 98–110)
Creat: 0.81 mg/dL (ref 0.50–0.99)
GFR, Est African American: 87 mL/min/{1.73_m2} (ref >=60)
GFR, Est Non African American: 75 mL/min/{1.73_m2} (ref >=60)
Globulin: 4.8 g/dL (calc) — ABNORMAL HIGH (ref 1.9–3.7)
Glucose, Bld: 104 mg/dL — ABNORMAL HIGH (ref 65–99)
Potassium: 4.1 mmol/L (ref 3.5–5.3)
Sodium: 138 mmol/L (ref 135–146)
Total Bilirubin: 0.6 mg/dL (ref 0.2–1.2)
Total Protein: 8.3 g/dL — ABNORMAL HIGH (ref 6.1–8.1)

## 2020-06-18 LAB — QUANTIFERON-TB GOLD PLUS
Mitogen-NIL: 9.35 IU/mL
NIL: 0.06 IU/mL
QuantiFERON-TB Gold Plus: NEGATIVE
TB1-NIL: <0 IU/mL
TB2-NIL: 0 IU/mL

## 2020-06-18 LAB — HEPATITIS A ANTIBODY, TOTAL: Hepatitis A AB,Total: REACTIVE — AB

## 2020-06-18 LAB — LIPID PANEL
Cholesterol: 198 mg/dL (ref <200)
HDL: 42 mg/dL — ABNORMAL LOW (ref >=50)
LDL Cholesterol (Calc): 130 mg/dL (calc) — ABNORMAL HIGH
Non-HDL Cholesterol (Calc): 156 mg/dL (calc) — ABNORMAL HIGH (ref <130)
Total CHOL/HDL Ratio: 4.7 (calc) (ref <5.0)
Triglycerides: 146 mg/dL (ref <150)

## 2020-06-18 LAB — HEPATITIS C ANTIBODY
Hepatitis C Ab: NONREACTIVE
SIGNAL TO CUT-OFF: 0.11 (ref <1.00)

## 2020-06-18 LAB — HIV ANTIBODY (ROUTINE TESTING W REFLEX): HIV 1&2 Ab, 4th Generation: REACTIVE — AB

## 2020-06-18 LAB — HEPATITIS B CORE ANTIBODY, TOTAL: Hep B Core Total Ab: NONREACTIVE

## 2020-06-18 LAB — HIV-1 RNA ULTRAQUANT REFLEX TO GENTYP+
HIV 1 RNA Quant: 11100 copies/mL — ABNORMAL HIGH
HIV-1 RNA Quant, Log: 4.05 Log copies/mL — ABNORMAL HIGH

## 2020-06-18 LAB — HIV-1 GENOTYPE: HIV-1 Genotype: DETECTED — AB

## 2020-06-18 LAB — HIV-1/2 AB - DIFFERENTIATION
HIV-1 antibody: POSITIVE — AB
HIV-2 Ab: NEGATIVE

## 2020-06-18 LAB — HLA B*5701: HLA-B*5701 w/rflx HLA-B High: NEGATIVE

## 2020-06-18 LAB — HEPATITIS B SURFACE ANTIBODY,QUALITATIVE: Hep B S Ab: NONREACTIVE

## 2020-06-18 LAB — HEPATITIS B SURFACE ANTIGEN: Hepatitis B Surface Ag: NONREACTIVE

## 2020-06-18 LAB — RPR: RPR Ser Ql: NONREACTIVE

## 2020-06-21 ENCOUNTER — Other Ambulatory Visit: Payer: Self-pay

## 2020-06-21 ENCOUNTER — Ambulatory Visit (INDEPENDENT_AMBULATORY_CARE_PROVIDER_SITE_OTHER): Payer: Medicare Other | Admitting: Pharmacist

## 2020-06-21 ENCOUNTER — Ambulatory Visit (INDEPENDENT_AMBULATORY_CARE_PROVIDER_SITE_OTHER): Payer: Medicare Other | Admitting: Internal Medicine

## 2020-06-21 ENCOUNTER — Encounter: Payer: Self-pay | Admitting: Internal Medicine

## 2020-06-21 ENCOUNTER — Telehealth: Payer: Self-pay

## 2020-06-21 VITALS — BP 183/90 | HR 82 | Temp 98.0°F | Ht 64.0 in | Wt 249.0 lb

## 2020-06-21 DIAGNOSIS — Z8719 Personal history of other diseases of the digestive system: Secondary | ICD-10-CM | POA: Diagnosis not present

## 2020-06-21 DIAGNOSIS — B20 Human immunodeficiency virus [HIV] disease: Secondary | ICD-10-CM

## 2020-06-21 DIAGNOSIS — R21 Rash and other nonspecific skin eruption: Secondary | ICD-10-CM

## 2020-06-21 NOTE — Telephone Encounter (Signed)
Spoke with patient to schedule lab appointment for 07/23/20 at 10:30 am per Dr. Gale Journey. Sent patient MyChart set-up link at patient's preferred number so that she can keep track of her appointments.   Beryle Flock, RN

## 2020-06-21 NOTE — Patient Instructions (Signed)
Thank you for using our ID service   We'll restart you on Prezcobix and tivicay. In the mean time I will investigate your prior HIV medication and see if we could simplify it  Start these meds today (2 pills once a day). See [name] in 4 weeks to recheck hiv viral load   I have referred you to dermatology as well for you rash  Please call your GI doctor if you have any problem with swallowing problem

## 2020-06-21 NOTE — Progress Notes (Addendum)
[facility] for Infectious Disease  Reason for Consult:HIV care Referring Provider: [facility]     Patient Active Problem List   Diagnosis Date Noted  . Acute pain of left shoulder 10/06/2018  . Hypothyroidism 10/23/2017  . Hyperlipidemia, mixed 08/11/2017  . CVA (cerebral vascular accident) ([place]) 08/11/2017  . Morbid obesity ([place]) 08/11/2017  . Vulvar cancer ([place]) 11/13/2011  . 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 History of CVA (cerebrovascular accident) 11/13/2011  . Post-menopausal bleeding 11/13/2011  . HIV INFECTION 09/08/2006  . CANDIDIASIS, VAGINAL 09/08/2006  . 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 PERIPHERAL NEUROPATHY 09/08/2006  . Essential hypertension 09/08/2006  . REFLUX ESOPHAGITIS 09/08/2006  . OVARIAN CYST 09/08/2006  . DYSPLASIA, CERVIX NOS 09/08/2006  . FOLLICULITIS 41/96/2229  . LYMPHADENOPATHY 09/08/2006  . PAP SMEAR, ABNORMAL 10/09/2000    Patient's Medications  New Prescriptions   No medications on file  Previous Medications   ASPIRIN (PX ENTERIC ASPIRIN) 81 MG EC TABLET    Take 1 tablet (81 mg total) by mouth daily. Swallow whole.   ATORVASTATIN (LIPITOR) 20 MG TABLET    Take 0.5 tablets (10 mg total) by mouth daily.   BIOFLAVONOID PRODUCTS (VITAMIN C) CHEW    Chew 1 tablet by mouth daily.   CHOLECALCIFEROL (VITAMIN D) 2000 UNITS CAPS    Take by mouth.   CLONIDINE (CATAPRES) 0.1 MG TABLET    Take 1 tablet (0.1 mg total) by mouth 2 (two) times daily.   COLCHICINE 0.6 MG TABLET    Take 0.6 mg by mouth 2 (two) times daily.   DARUNAVIR (PREZISTA) 600 MG TABLET    Take 600 mg by mouth 2 (two) times daily with a meal.   DARUNAVIR (PREZISTA) 600 MG TABLET    TAKE ONE TABLET BY MOUTH TWICE DAILY WITH MEALS.   DICYCLOMINE (BENTYL) 20 MG TABLET    Take 1 tablet (20 mg total) by mouth 2 (two) times daily.   DOLUTEGRAVIR (TIVICAY) 50 MG TABLET    TAKE 1 TABLET BY MOUTH ONCE A DAY.   DORAVIRINE (PIFELTRO) 100 MG TABS TABLET    Take by mouth.    HYDROCHLOROTHIAZIDE (HYDRODIURIL) 25 MG TABLET    Take 25 mg by mouth daily.   LEVOTHYROXINE (SYNTHROID, LEVOTHROID) 50 MCG TABLET    Take 1 tablet (50 mcg total) by mouth daily.   LISINOPRIL (PRINIVIL,ZESTRIL) 40 MG TABLET    Take 1 tablet (40 mg total) by mouth daily.   LOSARTAN POTASSIUM (COZAAR PO)    losartan   NAPROXEN (NAPROSYN) 375 MG TABLET    Take 1 tablet (375 mg total) by mouth 2 (two) times daily as needed for moderate pain.   NAPROXEN (NAPROSYN) 500 MG TABLET    Take 1 tablet (500 mg total) by mouth 2 (two) times daily with a meal.   ONDANSETRON (ZOFRAN) 4 MG TABLET    Take 1 tablet (4 mg total) by mouth every 8 (eight) hours as needed for nausea or vomiting.   PANTOPRAZOLE (PROTONIX) 40 MG TABLET    Take 40 mg by mouth daily.   PREDNISONE (DELTASONE) 10 MG TABLET    Take 2 tablets (20 mg total) by mouth 2 (two) times daily.   RANITIDINE (ZANTAC) 300 MG TABLET    Take by mouth.   RITONAVIR (NORVIR) 100 MG TABS    Take by mouth 2 (two) times daily with a meal.   TIVICAY 50 MG TABLET       TRAZODONE (DESYREL) 50  MG TABLET    Take 0.5-1 tablets (25-50 mg total) by mouth at bedtime as needed for sleep.   TRIAMTERENE-HYDROCHLOROTHIAZIDE (MAXZIDE-25) 37.5-25 MG TABLET    Take 1 tablet by mouth daily.   VALACYCLOVIR (VALTREX) 1000 MG TABLET    Take 1 tablet (1,000 mg total) by mouth 3 (three) times daily.   VITAMIN B-12 (CYANOCOBALAMIN) 1000 MCG TABLET    Take 1 tablet (1,000 mcg total) by mouth daily.  Modified Medications   No medications on file  Discontinued Medications   No medications on file    HPI: Laura GIBEAULT is a 69 y.o. female gerd/esophageal stricture, hx cva, obese, hiv/aids referred from another system for new patient's hiv care   She has been seen at [facility], maintained on ritonavir/preszista/tivicay. She has had trouble with esophageal stricture so had to crush her meds. Also complains of lots of nausea with the meds  Other issue is nonpainful skin rash  (papule) that had increased in number, especially the last few months. There is the initial LLQ lesion that has been there at least a year  No headache, cough, chest pain, diarrhea, abd pain, joint  Said she quit med 12/2019 due to nausea and concerned about rash and having to crush it  Reviewed her recent labs Previously appeared controlled with some blips Has a genotype a couple weeks ago without any resistance  She has no other complaint today  Does report 09/2019 episode of bells palsy within a few weeks finishing her ffizer covid vaccine  Review of Systems: ROS Negative 11 point ros unless mentioned above   Past Medical History:  Diagnosis Date  . Cancer ([place])   . CVA (cerebral infarction)   . HIV infection ([place])   . HTN (hypertension)   . Hyperlipidemia   . Morbid obesity with BMI of 40.0-44.9, adult ([place])   . Postmenopausal bleeding   . Stroke [facility])    no residual paralysis    Social History   Tobacco Use  . Smoking status: Former Smoker    Packs/day: 1.00    Years: 5.00    Pack years: 5.00    Types: Cigarettes  . Smokeless tobacco: Never Used  Vaping Use  . Vaping Use: Never used  Substance Use Topics  . Alcohol use: No  . Drug use: No    Family History  Problem Relation Age of Onset  . Hypertension Mother   . Hypertension Sister   . Arthritis Sister   . Cancer Other   . Diabetes Other    Allergies  Allergen Reactions  . Gadolinium Derivatives Nausea And Vomiting    Vomited with multihance 12/05/19  . Ivp Dye [Iodinated Diagnostic Agents] Other (See Comments)    Caused kidney damage  . Penicillins Swelling  . Tape Dermatitis  . Cephalexin Rash  . Latex Rash    OBJECTIVE: Vitals:   06/21/20 0838  BP: (!) 183/90  Pulse: 82  Temp: 98 F (36.7 C)  TempSrc: Oral  SpO2: 99%  Weight: 249 lb (112.9 kg)  Height: 5\' 4"  (1.626 m)   Body mass index is 42.74 kg/m.   Physical Exam Obese, no distress, pleasant, conversant HEENT: atraumatic,  Per, conj clear, eomi Neck supple CV rrr no mrg Lungs clear; normal respiratory effort abd s/nt; obese Skin several truncal 1-3 cm papules that are nontender, some heterogenous in coloration flesh/pigmented others purpulish appearing Neuro cn 2-12 intact, strength reflex symmetric intact, no tremor Psych alert/oriented Ext no edema  Lab: 10/11  cr 0.8; lft normal; cbc 4.4/12/139 10/11 lipid 198/42/130/146  HIV labs            cd4 (%) / VL 10/11                 261(13)  / 11k   HIV genotype 10/21   Comment: HIV Subtype: B  ___________________________________________________________  Antiretroviral drugs   Resistance Mutations Detected               Predicted  ___________________________________________________________                                      !  !                    NRTIs           !  !  ZDV (zidovudine or Retrovir)  ! NO!    ABC (abacavir or Ziagen)    ! NO!    ddI (didanosine or Videx)      ! NO!    3TC (lamivudine or Epivir)    ! NO!    FTC (emtricitabine or Emtriva) ! NO!    d4T (stavudine or Zerit)        ! NO!    TDF (tenofovir or Viread)      ! NO!    ________________________________!___!______________________                                     !  !                 NNRTIs          !  !  ETR (etravirine or Intelence)   ! NO!    EFV (efavirenz or Sustiva)    ! NO!    NVP (nevirapine or Viramune)! NO!    RPV (rilpivirine or Edurant)    ! NO!    DOR (doravirine or Pifeltro)   ! NO!    ________________________________!___!______________________                    !  !     PIs            !  !  FPV (fos-amprenavir or Lexiva) ! NO!    IDV (indinavir or Crixivan)    ! NO!    NFV (nelfinavir or Viracept)  ! NO!    SQV (saquinavir or Invirase)  ! NO!    LPV (lopinavir or Kaletra)       ! NO!    ATV (atazanavir or Reyataz)  ! NO!    TPV (tipranavir or Aptivus)     ! NO!    DRV (darunavir or Prezista)   ! NO!      Micro:  Serology: 10/11 urine gc/chlam pcr negative 10/11 rpr negative 10/11 hep b sab, sag, cab nonreactive 10/11 hep a ab positive 10/11 quantiferon gold negative  Assessment/plan: #hiv.  Per patient she was told she had resistance before and thus the preszista/ritonavir/tivicay combination. She is not sure what all meds she took (she did mention these three consistently). Her med list also show doravirine She has been off meds since 12/2019 due mainly to nausea She has chronic esophageal stricture and having to crush meds but that is not a problem She  has a genotype but 5 months off meds and likely only show wild type virus which there is no resistance shown  Discussed with her need for lifelong therapy ideally without break in between I will review her old notes more and see if we can piece together a more tolerable regimen if possible  Today will restart prezcobix and tivicay F/u in 4 weeks for repeat viral load testing She will see Cassie our pharmacist for all meds review prior to that  ------------- Addendum; reviewed outside ID notes 04/2018 id visit Archived resistance from 10/2017 nrti (m41L, m184v, l210w, t215y) NNRTI (K103N, y181C, G190A) PI (M46I, I54V, I84B, L90M) PI accessories G73S; other L10I, L63P, A71V, V77I Therapy at that time: Darunavir/rit bid + tivicay; attempt to reinitiate doravirine 10/2017 darunavir/rit bid + descovy Previous to this visit on biktarvy, doravirine but had nausea  Hx breaks in therapy due to side effect prior to discussing with providers Added descovy to bid boosted darunavir bid and once a day tivicay although patient hesitent   08/2006 hiv coreceptor dual tropism 08/2006 genotype m41L, m184, L210, t215y nrti -- all nrti resistance (intermediate tdf) K103n, y181c, g190a nnrti  doravirine/etravirine intermediate resistance; rilpiv/nnp/efv high resistance PI (m46i, I54V, I84v, l56m; accessory g73; other l10i, L63P, a71V, v77I) -- atv high level resistance, darunavir low level resistance, fosamprenavir hlr, lopinarvir hlr                          cd4 / VL 04/2018             420 / 31 08/2017             330 / 224k 04/2017             330 / 37 03/2017                    / 122k 02/2015             420 /  24   Vaccine 09/2019 & 10/2019 covid ffizer prevnar 02/2014 Pneumovax 05/2013 tdap 09/2017    #rash Not sure what this is 10/07 cd4 260 but only 13%. And she reports new lesion the last 2 months with 1 chronic lesion LLQ of more than a year She does have some gi sx not sure if all related to her esophageal stricture and likely ritonavir if she has been taking ddx for rash kaposi (a little atypical for current cd4) vs other cancer vs benign nevus (although concerning with pace of new lesions). Again OI such as bacillary angiomatosis or disseminated fungal infection are less likely  -refer to dermatology  [facility] Will review vaccines and other issue on next visit   Patient Active Problem List   Diagnosis Date Noted  . Acute pain of left shoulder 10/06/2018  . Hypothyroidism 10/23/2017  . Hyperlipidemia, mixed 08/11/2017  . CVA (cerebral vascular accident) ([place]) 08/11/2017  . Morbid obesity ([place]) 08/11/2017  . Vulvar cancer ([place]) 11/13/2011  . 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 History of CVA (cerebrovascular accident) 11/13/2011  . Post-menopausal bleeding 11/13/2011  . HIV INFECTION 09/08/2006  . CANDIDIASIS, VAGINAL 09/08/2006  . 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 PERIPHERAL NEUROPATHY 09/08/2006  . Essential hypertension 09/08/2006  . REFLUX ESOPHAGITIS 09/08/2006  . OVARIAN CYST 09/08/2006  . DYSPLASIA, CERVIX NOS 09/08/2006  . FOLLICULITIS 19/14/7829  . LYMPHADENOPATHY 09/08/2006  . PAP SMEAR, ABNORMAL 10/09/2000     Problem List Items Addressed This  Visit  None       I am having Mardene Celeste A. Elford maintain her darunavir, ritonavir, Vitamin C, vitamin B-12, levothyroxine, lisinopril, triamterene-hydrochlorothiazide, aspirin, ranitidine, doravirine, naproxen, Vitamin D, atorvastatin, traZODone, naproxen, Tivicay, darunavir, Tivicay, predniSONE, valACYclovir, cloNIDine, dicyclomine, ondansetron, pantoprazole, hydrochlorothiazide, colchicine, and Losartan Potassium (COZAAR PO).   No orders of the defined types were placed in this encounter.    Follow-up: No follow-ups on file.  Jabier Mutton, [facility] for Infectious Disease [place] -- -- pager   [phone] cell 06/21/2020, 9:02 AM

## 2020-06-22 NOTE — Progress Notes (Signed)
HPI: Laura Jimenez is a 69 y.o. female who presents to the [facility] clinic today to initiate care for a newly diagnosed HIV infection.  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 Swallow whole.   ATORVASTATIN (LIPITOR) 20 MG TABLET    Take 0.5 tablets (10 mg total) by mouth daily.   BIOFLAVONOID PRODUCTS (VITAMIN C) CHEW    Chew 1 tablet by mouth daily.   CHOLECALCIFEROL (VITAMIN D) 2000 UNITS CAPS    Take by mouth.   CLONIDINE (CATAPRES) 0.1 MG TABLET    Take 1 tablet (0.1 mg total) by mouth 2 (two) times daily.   COLCHICINE 0.6 MG TABLET    Take 0.6 mg by mouth 2 (two) times daily.   DARUNAVIR (PREZISTA) 600 MG TABLET    Take 600 mg by mouth 2 (two) times daily with a meal.   DARUNAVIR (PREZISTA) 600 MG TABLET    TAKE ONE TABLET BY MOUTH TWICE DAILY WITH MEALS.   DICYCLOMINE (BENTYL) 20 MG TABLET    Take 1 tablet (20 mg total) by mouth 2 (two) times daily.   DOLUTEGRAVIR (TIVICAY) 50 MG TABLET    TAKE 1 TABLET BY MOUTH ONCE A DAY.   DORAVIRINE (PIFELTRO) 100 MG TABS TABLET    Take  by mouth.   HYDROCHLOROTHIAZIDE (HYDRODIURIL) 25 MG TABLET    Take 25 mg by mouth daily.   LEVOTHYROXINE (SYNTHROID, LEVOTHROID) 50 MCG TABLET    Take 1 tablet (50 mcg total) by mouth daily.   LISINOPRIL (PRINIVIL,ZESTRIL) 40 MG TABLET    Take 1 tablet (40 mg total) by mouth daily.   LOSARTAN POTASSIUM (COZAAR PO)    losartan   NAPROXEN (NAPROSYN) 375 MG TABLET    Take 1 tablet (375 mg total) by mouth 2 (two) times daily as needed for moderate pain.   NAPROXEN (NAPROSYN) 500 MG TABLET    Take 1 tablet (500 mg total) by mouth 2 (two) times daily with a meal.   ONDANSETRON (ZOFRAN) 4 MG TABLET    Take 1 tablet (4 mg total) by mouth every 8 (eight) hours as needed for nausea or vomiting.   PANTOPRAZOLE (PROTONIX) 40 MG TABLET    Take 40 mg by mouth daily.   PREDNISONE (DELTASONE) 10 MG TABLET    Take 2 tablets (20 mg total) by mouth 2 (two) times daily.   RANITIDINE (ZANTAC) 300 MG TABLET    Take by mouth.   RITONAVIR (NORVIR) 100 MG TABS    Take by mouth 2 (two) times daily with a meal.   TIVICAY 50 MG TABLET  TRAZODONE (DESYREL) 50 MG TABLET    Take 0.5-1 tablets (25-50 mg total) by mouth at bedtime as needed for sleep.   TRIAMTERENE-HYDROCHLOROTHIAZIDE (MAXZIDE-25) 37.5-25 MG TABLET    Take 1 tablet by mouth daily.   VALACYCLOVIR (VALTREX) 1000 MG TABLET    Take 1 tablet (1,000 mg total) by mouth 3 (three) times daily.   VITAMIN B-12 (CYANOCOBALAMIN) 1000 MCG TABLET    Take 1 tablet (1,000 mcg total) by mouth daily.  Modified Medications   No medications on file  Discontinued Medications   No medications on file    Allergies: Allergies  Allergen Reactions   Gadolinium Derivatives Nausea And Vomiting    Vomited with multihance 12/05/19   Ivp Dye [Iodinated Diagnostic Agents] Other (See Comments)    Caused kidney damage   Penicillins Swelling   Tape Dermatitis   Cephalexin Rash   Latex Rash    Past Medical History: Past Medical History:  Diagnosis Date   Cancer ([place])      CVA (cerebral infarction)    HIV infection ([place])    HTN (hypertension)    Hyperlipidemia    Morbid obesity with BMI of 40.0-44.9, adult ([place])    Postmenopausal bleeding    Stroke ([place])    no residual paralysis    Social History: Social History   Socioeconomic History   Marital status: Widowed    Spouse name: Not on file   Number of children: Not on file   Years of education: Not on file   Highest education level: 10th grade  Occupational History   Not on file  Tobacco Use   Smoking status: Former Smoker    Packs/day: 1.00    Years: 5.00    Pack years: 5.00    Types: Cigarettes   Smokeless tobacco: Never Used  Scientific laboratory technician Use: Never used  Substance and Sexual Activity   Alcohol use: No   Drug use: No   Sexual activity: Not Currently    Birth control/protection: None, Post-menopausal  Other Topics Concern   Not on file  Social History Narrative   Grew up in [place], [place].   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   2 children, boy and girl. Son is 54s, and special needs.   Daughter in her 81s, is a Marine scientist.   Eats all food groups.   Wears seatbelt.   Attends church.   Enjoys watching tv, listening to [employer], and spending time with friends.   Enjoys going out to eat.    Social Determinants of Health   Financial Resource Strain:    Difficulty of Paying Living Expenses: Not on file  Food Insecurity:    Worried About Charity fundraiser in the Last Year: Not on file   [employer] of Food in the Last Year: Not on file  Transportation Needs:    Lack of Transportation (Medical): Not on file   Lack of Transportation (Non-Medical): Not on file  Physical Activity:    Days of Exercise per Week: Not on file   Minutes of Exercise per Session: Not on file  Stress:    Feeling of Stress : Not on file  Social Connections:    Frequency of Communication with Friends and Family: Not on file   Frequency of Social Gatherings  with Friends and Family: Not on file   Attends Religious Services: Not on file   Active Member of Clubs or Organizations: Not on file   Attends Club  or Organization Meetings: Not on file   Marital Status: Not on file    Labs: Lab Results  Component Value Date   HIV1RNAQUANT 11,100 (H) 06/04/2020   HIV1RNAQUANT 131000 (?) 05/08/2004   CD4TABS 261 (L) 06/04/2020   CD4TABS 250 11/14/1998    RPR and STI Lab Results  Component Value Date   LABRPR NON-REACTIVE 06/04/2020    STI Results GC CT  06/04/2020 Negative Negative    Hepatitis B Lab Results  Component Value Date   HEPBSAB NON-REACTIVE 06/04/2020   HEPBSAG NON-REACTIVE 06/04/2020   HEPBCAB NON-REACTIVE 06/04/2020   Hepatitis C Lab Results  Component Value Date   HEPCAB NON-REACTIVE 06/04/2020   Hepatitis A Lab Results  Component Value Date   HAV REACTIVE (A) 06/04/2020   Lipids: Lab Results  Component Value Date   CHOL 198 06/04/2020   TRIG 146 06/04/2020   HDL 42 (L) 06/04/2020   CHOLHDL 4.7 06/04/2020   LDLCALC 130 (H) 06/04/2020    Current HIV Regimen: Treatment naive  Assessment: Laura Jimenez is here today to establish care with Dr. Gale Journey for her HIV infection. She is currently taking Prezista + Norvir + Tivicay but has some trouble swallowing her medications and also has some trouble with nausea. Dr. Gale Journey simplified her regimen and placed her on Prezcobix + Tivicay. She states that she is on this regimen due to resistance that has developed. Her genotype here was wildtype but her viral load was also high at 11,100.   She is also on several other medications which she cannot tell me about. She will come back and see me on Tuesday and bring her medications with her for a complete med rec. Will see her then.  Plan: - F/u with me again on Tuesday  Teondre Jarosz L. Eber Hong, PharmD, BCIDP, AAHIVP, CPP Clinical Pharmacist Practitioner Infectious Diseases [facility]  [facility] 06/22/2020, 11:18 AM

## 2020-06-25 ENCOUNTER — Encounter: Payer: Self-pay | Admitting: Internal Medicine

## 2020-06-25 ENCOUNTER — Telehealth: Payer: Self-pay | Admitting: Pharmacist

## 2020-06-25 ENCOUNTER — Encounter (HOSPITAL_COMMUNITY): Payer: Self-pay

## 2020-06-25 DIAGNOSIS — B20 Human immunodeficiency virus [HIV] disease: Secondary | ICD-10-CM

## 2020-06-25 MED ORDER — PREZCOBIX 800-150 MG PO TABS: 1.0000 | ORAL_TABLET | Freq: Every day | ORAL | 5 refills | Status: DC

## 2020-06-25 MED ORDER — TIVICAY 50 MG PO TABS: 50.0000 mg | ORAL_TABLET | Freq: Every day | ORAL | 5 refills | Status: DC

## 2020-06-25 NOTE — Telephone Encounter (Signed)
Yes i was going to touch base with you.... Although is there some data for once a day darunavir in mdr hiv as well.... Otherwise will have to switch her back, or considering adding a third agent like descovy

## 2020-06-25 NOTE — Telephone Encounter (Signed)
Received note from prior clinical pharmacist at [facility] with patient's resistance mutations:  Cumulative HIV Genotype Data  RT Mutations  M41L, M184V, L210W, T215Y, K103N, Y181C, G190A  PI Mutations  M46I, I54V, I84B, L90M, G73S, L10I, L63P, A71V, V77I  Integrase Mutations     Interpretation of Genotype Data per [employer] HIV Drug Resistance Database:  Nucleoside RTIs  Abacavir - high level resistance Zidovudine - high level resistance Emtricitabine - high level resistance Lamivudine - high level resistance Tenofovir - intermediate resistance   Non-Nucleoside RTIs  Doravirine - intermediate resistance Efavirenz - high level resistance Etravirine - intermediate resistance Nevirapine - high level resistance Rilpivirine - high level resistance   Protease Inhibitors  Atazanavir - high level resistance Darunavir - low level resistance Lopinavir - high level resistance   Integrase Inhibitors  Bictegravir - N/A Dolutegravir - N/A Elvitegravir - N/A Raltegravir - N/A   Note states that patient was previously on Prezista/Norvir BID + Tivicay daily and tolerated it well. She was previously on Biktarvy but did not tolerated due to nausea and vomiting. She was also prescribed Pifeltro which she did not tolerate either.  Note is located under media tab.

## 2020-06-25 NOTE — Telephone Encounter (Signed)
She's got high level resistance to all NRTIs.Marland Kitchen not sure Descovy would do much for her. What do you think?

## 2020-06-25 NOTE — Telephone Encounter (Signed)
Just for the viral fitness... But what do you think about daily prezcobix. Do you believe that data

## 2020-06-25 NOTE — Progress Notes (Signed)
I have attempted to place an introductory phone call to this patient today. Unable to reach the patient at this time. I will plan to meet with her during her initial visit with Dr. Delton Coombes.

## 2020-06-26 ENCOUNTER — Other Ambulatory Visit: Payer: Medicare Other

## 2020-06-26 ENCOUNTER — Ambulatory Visit: Payer: Medicare Other | Admitting: Pharmacist

## 2020-06-27 ENCOUNTER — Inpatient Hospital Stay (HOSPITAL_COMMUNITY): Payer: Medicare Other

## 2020-06-27 ENCOUNTER — Ambulatory Visit (INDEPENDENT_AMBULATORY_CARE_PROVIDER_SITE_OTHER): Payer: Medicare Other | Admitting: Pharmacist

## 2020-06-27 ENCOUNTER — Encounter (HOSPITAL_COMMUNITY): Payer: Self-pay | Admitting: Hematology

## 2020-06-27 ENCOUNTER — Inpatient Hospital Stay (HOSPITAL_COMMUNITY): Payer: Medicare Other | Attending: Hematology | Admitting: Hematology

## 2020-06-27 ENCOUNTER — Other Ambulatory Visit: Payer: Self-pay

## 2020-06-27 VITALS — BP 180/84 | HR 75 | Temp 97.2°F | Resp 20 | Ht 64.0 in | Wt 250.2 lb

## 2020-06-27 DIAGNOSIS — Z87891 Personal history of nicotine dependence: Secondary | ICD-10-CM | POA: Insufficient documentation

## 2020-06-27 DIAGNOSIS — E785 Hyperlipidemia, unspecified: Secondary | ICD-10-CM | POA: Insufficient documentation

## 2020-06-27 DIAGNOSIS — I10 Essential (primary) hypertension: Secondary | ICD-10-CM | POA: Insufficient documentation

## 2020-06-27 DIAGNOSIS — R591 Generalized enlarged lymph nodes: Secondary | ICD-10-CM

## 2020-06-27 DIAGNOSIS — Z8541 Personal history of malignant neoplasm of cervix uteri: Secondary | ICD-10-CM | POA: Insufficient documentation

## 2020-06-27 DIAGNOSIS — Z8673 Personal history of transient ischemic attack (TIA), and cerebral infarction without residual deficits: Secondary | ICD-10-CM | POA: Diagnosis not present

## 2020-06-27 DIAGNOSIS — B20 Human immunodeficiency virus [HIV] disease: Secondary | ICD-10-CM

## 2020-06-27 DIAGNOSIS — R590 Localized enlarged lymph nodes: Secondary | ICD-10-CM | POA: Insufficient documentation

## 2020-06-27 DIAGNOSIS — Z6841 Body Mass Index (BMI) 40.0 and over, adult: Secondary | ICD-10-CM | POA: Diagnosis not present

## 2020-06-27 LAB — COMPREHENSIVE METABOLIC PANEL
ALT: 15 U/L (ref 0–44)
AST: 24 U/L (ref 15–41)
Albumin: 3.2 g/dL — ABNORMAL LOW (ref 3.5–5.0)
Alkaline Phosphatase: 61 U/L (ref 38–126)
Anion gap: 5 (ref 5–15)
BUN: 10 mg/dL (ref 8–23)
CO2: 26 mmol/L (ref 22–32)
Calcium: 8.5 mg/dL — ABNORMAL LOW (ref 8.9–10.3)
Chloride: 105 mmol/L (ref 98–111)
Creatinine, Ser: 0.85 mg/dL (ref 0.44–1.00)
GFR, Estimated: >60 mL/min (ref >60)
Glucose, Bld: 104 mg/dL — ABNORMAL HIGH (ref 70–99)
Potassium: 3.5 mmol/L (ref 3.5–5.1)
Sodium: 136 mmol/L (ref 135–145)
Total Bilirubin: 0.7 mg/dL (ref 0.3–1.2)
Total Protein: 8.5 g/dL — ABNORMAL HIGH (ref 6.5–8.1)

## 2020-06-27 LAB — CBC WITH DIFFERENTIAL/PLATELET
Abs Immature Granulocytes: 0.02 10*3/uL (ref 0.00–0.07)
Basophils Absolute: 0 10*3/uL (ref 0.0–0.1)
Basophils Relative: 0 %
Eosinophils Absolute: 0 10*3/uL (ref 0.0–0.5)
Eosinophils Relative: 1 %
HCT: 35.3 % — ABNORMAL LOW (ref 36.0–46.0)
Hemoglobin: 10.9 g/dL — ABNORMAL LOW (ref 12.0–15.0)
Immature Granulocytes: 0 %
Lymphocytes Relative: 62 %
Lymphs Abs: 3.2 10*3/uL (ref 0.7–4.0)
MCH: 30 pg (ref 26.0–34.0)
MCHC: 30.9 g/dL (ref 30.0–36.0)
MCV: 97.2 fL (ref 80.0–100.0)
Monocytes Absolute: 0.5 10*3/uL (ref 0.1–1.0)
Monocytes Relative: 10 %
Neutro Abs: 1.4 10*3/uL — ABNORMAL LOW (ref 1.7–7.7)
Neutrophils Relative %: 27 %
Platelets: 101 10*3/uL — ABNORMAL LOW (ref 150–400)
RBC: 3.63 MIL/uL — ABNORMAL LOW (ref 3.87–5.11)
RDW: 14 % (ref 11.5–15.5)
WBC: 5.2 10*3/uL (ref 4.0–10.5)
nRBC: 0 % (ref 0.0–0.2)

## 2020-06-27 LAB — LACTATE DEHYDROGENASE: LDH: 182 U/L (ref 98–192)

## 2020-06-27 MED ORDER — RITONAVIR 80 MG/ML PO SOLN: 100.0000 mg | Freq: Two times a day (BID) | ORAL | 1 refills | Status: DC

## 2020-06-27 MED ORDER — ONDANSETRON HCL 4 MG PO TABS: 4.0000 mg | ORAL_TABLET | Freq: Two times a day (BID) | ORAL | 1 refills | Status: DC | PRN

## 2020-06-27 MED ORDER — DARUNAVIR ETHANOLATE 600 MG PO TABS: 600.0000 mg | ORAL_TABLET | Freq: Two times a day (BID) | ORAL | 1 refills | Status: DC

## 2020-06-27 NOTE — Progress Notes (Signed)
[place], [place] [address]   CLINIC:  Medical Oncology/Hematology  Patient Care Team: Caren Macadam, MD as PCP - General (Family Medicine) Dishmon, Garwin Brothers, RN as Oncology Nurse Navigator (Oncology)  CHIEF COMPLAINTS/PURPOSE OF CONSULTATION:  Evaluation of lymphoma  HISTORY OF PRESENTING ILLNESS:  Laura Jimenez 69 y.o. female is here because of evaluation of lymphoma, at the request of Dr. Caren Macadam from [facility] [place].  Today she reports feeling poorly. She reports that she developed painful lymphadenopathy on the right side of her neck in 04/2020 after going to the beach. She reports that she lost 15 lbs in the past 6 months unintentionally, non-drenching night sweats. She was treated for vulvar cancer with surgery in 2013. She stopped taking her anti-viral meds because she was having break-outs on her skin; she will try to see a dermatologist. She reports that she developed Bell's palsy 5 days after getting her first [place] shot in 09/2019, though she received her second vaccine afterwards without any side effects.  She reports feeling stretched thin at the moment between all of the doctors in [place]. She reports having severe back pain requiring pain meds, pain patches and creams. She currently lives with her daughter; she does not have any pets at home. She is retired; she used to work for a Therapist, nutritional. She used to smoke as a teenager. She had 3 maternal aunts who had cancer; one had cervical cancer, another had bone cancer, and another had cervical caner.  MEDICAL HISTORY:  Past Medical History:  Diagnosis Date  . Cancer ([place])   . CVA (cerebral infarction)   . HIV infection ([place])   . HTN (hypertension)   . Hyperlipidemia   . Morbid obesity with BMI of 40.0-44.9, adult ([facility] [place])   . Postmenopausal bleeding   . Stroke [facility])    no residual paralysis    SURGICAL HISTORY: Past Surgical  History:  Procedure Laterality Date  . COLONOSCOPY  2000  . HYSTEROSCOPY WITH D & C N/A 04/19/2013   Procedure: DILATATION AND CURETTAGE /HYSTEROSCOPY;  Surgeon: Emily Filbert, MD;  Location: [facility];  Service: Gynecology;  Laterality: N/A;    SOCIAL HISTORY: Social History   Socioeconomic History  . Marital status: Widowed    Spouse name: Not on file  . Number of children: Not on file  . Years of education: Not on file  . Highest education level: 10th grade  Occupational History  . Not on file  Tobacco Use  . Smoking status: Former Smoker    Packs/day: 1.00    Years: 5.00    Pack years: 5.00    Types: Cigarettes  . Smokeless tobacco: Never Used  Vaping Use  . Vaping Use: Never used  Substance and Sexual Activity  . Alcohol use: No  . Drug use: No  . Sexual activity: Not Currently    Birth control/protection: None, Post-menopausal  Other Topics Concern  . Not on file  Social History Narrative   Grew up in [place], [place].   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   2 children, boy and girl. Son is 61s, and special needs.   Daughter in her 91s, is a Marine scientist.   Eats all food groups.   Wears seatbelt.   Attends church.   Enjoys watching tv, listening to [employer], and spending time with friends.   Enjoys going out to eat.    Social  Determinants of Health   Financial Resource Strain: Low Risk   . Difficulty of Paying Living Expenses: Not hard at all  Food Insecurity: No Food Insecurity  . Worried About Programme researcher, broadcasting/film/video in the Last Year: Never true  . Ran Out of Food in the Last Year: Never true  Transportation Needs: No Transportation Needs  . Lack of Transportation (Medical): No  . Lack of Transportation (Non-Medical): No  Physical Activity: Sufficiently Active  . Days of Exercise per Week: 7 days  . Minutes of Exercise per Session: 60 min  Stress: Stress Concern Present  . Feeling of Stress : To some extent  Social Connections: Moderately Isolated    . Frequency of Communication with Friends and Family: More than three times a week  . Frequency of Social Gatherings with Friends and Family: Once a week  . Attends Religious Services: More than 4 times per year  . Active Member of Clubs or Organizations: No  . Attends Banker Meetings: Never  . Marital Status: Widowed  Intimate Partner Violence: Not At Risk  . Fear of Current or Ex-Partner: No  . Emotionally Abused: No  . Physically Abused: No  . Sexually Abused: No    FAMILY HISTORY: Family History  Problem Relation Age of Onset  . Hypertension Mother   . Hypertension Sister   . Arthritis Sister   . Cancer Other   . Diabetes Other     ALLERGIES:  is allergic to gadolinium derivatives, ivp dye [iodinated diagnostic agents], penicillins, tape, cephalexin, and latex.  MEDICATIONS:  Current Outpatient Medications  Medication Sig Dispense Refill  . atorvastatin (LIPITOR) 20 MG tablet Take 0.5 tablets (10 mg total) by mouth daily. 90 tablet 3  . cloNIDine (CATAPRES) 0.1 MG tablet Take 1 tablet (0.1 mg total) by mouth 2 (two) times daily. 60 tablet 1  . pantoprazole (PROTONIX) 40 MG tablet Take 40 mg by mouth daily.    . Vitamin D, Ergocalciferol, (DRISDOL) 1.25 MG (50000 UNIT) CAPS capsule Take 50,000 Units by mouth every 7 (seven) days.    Marland Kitchen aspirin (PX ENTERIC ASPIRIN) 81 MG EC tablet Take 1 tablet (81 mg total) by mouth daily. Swallow whole. (Patient not taking: Reported on 06/27/2020) 30 tablet 12  . Bioflavonoid Products (VITAMIN C) CHEW Chew 1 tablet by mouth daily. (Patient not taking: Reported on 06/27/2020)    . colchicine 0.6 MG tablet Take 0.6 mg by mouth as needed (for gout flares).  (Patient not taking: Reported on 06/27/2020)    . darunavir (PREZISTA) 600 MG tablet Take 1 tablet (600 mg total) by mouth 2 (two) times daily. (Patient not taking: Reported on 06/27/2020) 60 tablet 1  . dolutegravir (TIVICAY) 50 MG tablet Take 1 tablet (50 mg total) by mouth daily.  (Patient not taking: Reported on 06/27/2020) 30 tablet 5  . hydrochlorothiazide (HYDRODIURIL) 25 MG tablet Take 25 mg by mouth daily. (Patient not taking: Reported on 06/27/2020)    . ibuprofen (ADVIL) 200 MG tablet Take 200 mg by mouth every 6 (six) hours as needed. (Patient not taking: Reported on 06/27/2020)    . ondansetron (ZOFRAN) 4 MG tablet Take 1 tablet (4 mg total) by mouth 2 (two) times daily as needed for nausea. Take 30-60 minutes prior to taking ritonavir solution (Patient not taking: Reported on 06/27/2020) 60 tablet 1  . ritonavir (NORVIR) 80 MG/ML solution Take 1.3 mLs (104 mg total) by mouth 2 (two) times daily with a meal. (Patient not taking: Reported  on 06/27/2020) 78 mL 1   No current facility-administered medications for this visit.    REVIEW OF SYSTEMS:   Review of Systems  Constitutional: Positive for appetite change (70%), diaphoresis (non-drenching) and unexpected weight change (lost 15 lbs in 6 months). Negative for fatigue.  HENT:   Positive for trouble swallowing (seeing GI).   Cardiovascular: Positive for chest pain (chest and shoulders pain) and palpitations.  Gastrointestinal: Positive for abdominal pain (occasional), diarrhea (intermittently), nausea (intermittently) and vomiting (intermittently).  Genitourinary: Positive for bladder incontinence.   Musculoskeletal: Positive for back pain (7/10 back & legs pain).  Psychiatric/Behavioral: Positive for depression and sleep disturbance. The patient is nervous/anxious.      PHYSICAL EXAMINATION: ECOG PERFORMANCE STATUS: 1 - Symptomatic but completely ambulatory  Vitals:   06/27/20 1313  BP: (!) 180/84  Pulse: 75  Resp: 20  Temp: (!) 97.2 F (36.2 C)  SpO2: 98%   Filed Weights   06/27/20 1313  Weight: 250 lb 3.2 oz (113.5 kg)   Physical Exam Vitals reviewed.  Constitutional:      Appearance: Normal appearance. She is obese.  Cardiovascular:     Rate and Rhythm: Normal rate and regular rhythm.      Pulses: Normal pulses.     Heart sounds: Normal heart sounds.  Pulmonary:     Effort: Pulmonary effort is normal.     Breath sounds: Normal breath sounds.  Lymphadenopathy:     Cervical: No cervical adenopathy.     Upper Body:     Right upper body: No supraclavicular, axillary or pectoral adenopathy.     Left upper body: No supraclavicular, axillary or pectoral adenopathy.  Neurological:     General: No focal deficit present.     Mental Status: She is alert and oriented to person, place, and time.  Psychiatric:        Mood and Affect: Mood normal.        Behavior: Behavior normal.      LABORATORY DATA:  I have reviewed the data as listed Recent Results (from the past 2160 hour(s))  T-helper cell (CD4)- ([facility] clinic only)     Status: Abnormal   Collection Time: 06/04/20 10:26 AM  Result Value Ref Range   CD4 T Cell Abs 261 (L) 400 - 1,790 /uL   CD4 % Helper T Cell 13 (L) 33 - 65 %    Comment: Performed at [facility], [place] [address], [place], [place] [address]  QuantiFERON-TB [place] Rm Plus     Status: None   Collection Time: 06/04/20 10:30 AM  Result Value Ref Range   QuantiFERON-TB Gold Plus NEGATIVE NEGATIVE    Comment: Negative test result. M. tuberculosis complex  infection unlikely.    NIL 0.06 IU/mL   Mitogen-NIL 9.35 IU/mL   TB1-NIL <0.00 IU/mL   TB2-NIL 0.00 IU/mL    Comment: . The Nil tube value reflects the background interferon gamma immune response of the patient's blood sample. This value has been subtracted from the patient's displayed TB and Mitogen results. . Lower than expected results with the Mitogen tube prevent false-negative Quantiferon readings by detecting a patient with a potential immune suppressive condition and/or suboptimal pre-analytical specimen handling. . The TB1 Antigen tube is coated with the M. tuberculosis-specific antigens designed to elicit responses from TB antigen primed CD4+ helper T-lymphocytes. . The  TB2 Antigen tube is coated with the M. tuberculosis-specific antigens designed to elicit responses from TB antigen primed CD4+ helper and CD8+ cytotoxic T-lymphocytes. Marland Kitchen  For additional information, please refer to https://education.quest[facility].com/faq/FAQ204 (This link is being provided for informational/ educational purposes only.) .   HLA B*5701     Status: None   Collection Time: 06/04/20 10:30 AM  Result Value Ref Range   HLA-B*5701 w/rflx HLA-B High Negative     Comment: . The allele HLA-B*5701 is associated with Abacavir hypersensitivity reaction (HSR). A negative result for HLA-B*5701 does not rule out the possibility of Abacavir HSR. Genetic counseling as needed. Marland Kitchen RESULTS REVIEWED BY:              Vickey Huger, M.D. # Ph.D., [facility] Technical Director, Cytogenetics and Genomics . References: Mallal S, et al. Elmore Guise. 2002 6:812(7517): [place] 2008 358(6): [id number] . Typing performed by using AS-PCR with reflex to the FDA-cleared LABType(R) SSO Kit. The AS-PCR portion of this test was developed and its analytical performance characteristics have been determined by [place], [place].  It has not been cleared or approved by the U.S. Food and Drug Administration.  This assay has been validated pursuant to the CLIA regulations and is used for clinical purposes. Marland Kitchen   HIV-1 RNA ULTRAQUANT REFLEX TO GENTYP+     Status: Abnormal   Collection Time: 06/04/20 10:30 AM  Result Value Ref Range   HIV 1 RNA Quant 11,100 (H) copies/mL   HIV-1 RNA Quant, Log 4.05 (H) Log copies/mL    Comment: REFERENCE RANGE:           NOT DETECTED  copies/mL           NOT DETECTED  Log copies/mL . This test was performed using Real-Time Polymerase Chain Reaction. . Reportable range is 20 to 10,000,000 copies/mL (1.30-7.00 Log copies/mL).   HIV antibody     Status: Abnormal   Collection Time: 06/04/20 10:30 AM  Result Value  Ref Range   HIV 1&2 Ab, 4th Generation REPEATEDLY REACTIVE (A) NON-REACTI    Comment: The repeatedly reactive screening assay result is confirmed by duplicate repeat testing, and indicates a POSSIBLE presence of HIV-1 antibodies or HIV-2  antibodies, and/or HIV-1 p24 antigen. Additional testing is required for diagnosis.  . Therefore, these screening results must be correlated  with results of reflex confirmatory tests, including the HIV-1/HIV-2 antibody differentiation assay and, if necessary, HIV-1 RNA, Qualitative Real-Time PCR. Marland Kitchen The 4th generation HIV-1/2 Antigen/Antibody combination immunoassay is a screening test and should not be used alone for diagnosis. Repeatedly reactive results from the 4th generation screening test are only indicative of HIV infection when those screening results are confirmed to be positive by either the HIV-1/2 Antibody Differentiation Assay or the HIV-1 RNA, Qualitative Real-Time PCR test. . PLEASE NOTE: This information has been disclosed to you from records whose confidentiality may be protected by state law. If  your state requires such protection, then the state law prohibits you from making any further disclosure of the information without the specific written consent of the person to whom it pertains, or as otherwise permitted by law. A general authorization for the release of medical or other information is NOT sufficient for this purpose. . The performance of this assay has not been clinically validated in patients less than 53 years old. Marland Kitchen   HEPATITIS C ANTIBODY     Status: None   Collection Time: 06/04/20 10:30 AM  Result Value Ref Range   Hepatitis C Ab NON-REACTIVE NON-REACTI   SIGNAL TO CUT-OFF 0.11 <1.00    Comment: . HCV  antibody was non-reactive. There is no laboratory  evidence of HCV infection. . In most cases, no further action is required. However, if recent HCV exposure is suspected, a test for HCV RNA (test code  44315) is suggested. . For additional information please refer to http://education.[facility].com/faq/FAQ22v1 (This link is being provided for informational/ educational purposes only.) .   HEPATITIS A ANTIBODY, TOTAL     Status: Abnormal   Collection Time: 06/04/20 10:30 AM  Result Value Ref Range   Hepatitis A AB,Total REACTIVE (A) NON-REACTI    Comment: . For additional information, please refer to  http://education.[facility].com/faq/FAQ202  (This link is being provided for informational/ educational purposes only.) .   HEPATITIS B SURFACE ANTIBODY     Status: None   Collection Time: 06/04/20 10:30 AM  Result Value Ref Range   Hep B S Ab NON-REACTIVE NON-REACTI  HEPATITIS B CORE ANTIBODY, TOTAL     Status: None   Collection Time: 06/04/20 10:30 AM  Result Value Ref Range   Hep B Core Total Ab NON-REACTIVE NON-REACTI  RPR     Status: None   Collection Time: 06/04/20 10:30 AM  Result Value Ref Range   RPR Ser Ql NON-REACTIVE NON-REACTI  HEPATITIS B SURFACE ANTIGEN     Status: None   Collection Time: 06/04/20 10:30 AM  Result Value Ref Range   Hepatitis B Surface Ag NON-REACTIVE NON-REACTI  CBC WITH DIFFERENTIAL/PLATELET     Status: Abnormal   Collection Time: 06/04/20 10:30 AM  Result Value Ref Range   WBC 4.4 3.8 - 10.8 Thousand/uL   RBC 3.83 3.80 - 5.10 Million/uL   Hemoglobin 11.6 (L) 11.7 - 15.5 g/dL   HCT 40.0 35 - 45 %   MCV 93.2 80.0 - 100.0 fL   MCH 30.3 27.0 - 33.0 pg   MCHC 32.5 32.0 - 36.0 g/dL   RDW 86.7 61.9 - 50.9 %   Platelets 139 (L) 140 - 400 Thousand/uL    Comment: Platelet clumps noted on smear-count appears adequate.   MPV 10.3 7.5 - 12.5 fL   Neutro Abs 1,685 1,500 - 7,800 cells/uL   Lymphs Abs 2,204 850 - 3,900 cells/uL   Absolute Monocytes 400 200 - 950 cells/uL   Eosinophils Absolute 62 15.0 - 500.0 cells/uL   Basophils Absolute 48 0.0 - 200.0 cells/uL   Neutrophils Relative % 38.3 %   Total Lymphocyte 50.1 %   Monocytes  Relative 9.1 %   Eosinophils Relative 1.4 %   Basophils Relative 1.1 %  COMPLETE METABOLIC PANEL WITH GFR     Status: Abnormal   Collection Time: 06/04/20 10:30 AM  Result Value Ref Range   Glucose, Bld 104 (H) 65 - 99 mg/dL    Comment: .            Fasting reference interval . For someone without known diabetes, a glucose value between 100 and 125 mg/dL is consistent with prediabetes and should be confirmed with a follow-up test. .    BUN 12 7 - 25 mg/dL   Creat 3.26 7.12 - 4.58 mg/dL    Comment: For patients >78 years of age, the reference limit for Creatinine is approximately 13% higher for people identified as African-American. .    GFR, Est Non African American 75 > OR = 60 mL/min/1.53m2   GFR, Est African American 87 > OR = 60 mL/min/1.37m2   BUN/Creatinine Ratio NOT APPLICABLE 6 - 22 (calc)   Sodium 138 135 - 146 mmol/L   Potassium 4.1  3.5 - 5.3 mmol/L   Chloride 105 98 - 110 mmol/L   CO2 29 20 - 32 mmol/L   Calcium 9.4 8.6 - 10.4 mg/dL   Total Protein 8.3 (H) 6.1 - 8.1 g/dL   Albumin 3.5 (L) 3.6 - 5.1 g/dL   Globulin 4.8 (H) 1.9 - 3.7 g/dL (calc)   AG Ratio 0.7 (L) 1.0 - 2.5 (calc)   Total Bilirubin 0.6 0.2 - 1.2 mg/dL   Alkaline phosphatase (APISO) 68 37 - 153 U/L   AST 17 10 - 35 U/L   ALT 9 6 - 29 U/L  HIV-1/2 AB - differentiation     Status: Abnormal   Collection Time: 06/04/20 10:30 AM  Result Value Ref Range   HIV-1 antibody POSITIVE (A) NEGATIVE   HIV-2 Ab NEGATIVE NEGATIVE    Comment: . FINAL ASSAY INTERPRETATION: HIV-1 POSITIVE . Laboratory evidence of HIV-1 infection is present in  patients greater than 74 months of age.  . In patients less than 45 months of age, a positive  HIV-1 Antibody Differentiation assay result may  represent passive transfer of maternal antibody.  . In patients less than 81 months of age, testing the patient's specimen with the HIV-1 RNA, Qualitative  Real-Time PCR assay, or the HIV-1 DNA, Qualitative, PCR assay, is  recommended to confirm HIV-1 infection. Marland Kitchen PLEASE NOTE: This information has been disclosed to you from records whose confidentiality may be protected by state law. If your state requires such protection, then the state law prohibits you from  making any further disclosure of the information without the specific written consent of the person to whom it pertains, or as otherwise permitted by law. A general authorization for the release of medical or other information is NOT sufficient for this purpose. .  For additional information please refer to http://education.[facility].com/faq/FAQ106 (This link is being provided for informational/ educational purposes only.) .   HIV-1 Genotype     Status: Abnormal   Collection Time: 06/04/20 10:30 AM  Result Value Ref Range   HIV-1 Genotype DETECTED (A)     Comment: HIV Subtype: B ___________________________________________________________ Antiretroviral drugs      Resistance  Mutations Detected                           Predicted ___________________________________________________________                                 !   !       NRTIs                     !   ! ZDV (zidovudine or Retrovir)    ! NO!     ABC (abacavir or Ziagen)        ! NO!     ddI (didanosine or Videx)       ! NO!     3TC (lamivudine or Epivir)      ! NO!     FTC (emtricitabine or Emtriva)  ! NO!     d4T (stavudine or Zerit)        ! NO!     TDF (tenofovir or Viread)       ! NO!     ________________________________!___!______________________                                 !   !  NNRTIs                    !   ! ETR (etravirine or Intelence)   ! NO!     EFV (efavirenz or Sustiva)      ! NO!     NVP (nevirapine or Viramune)    ! NO!     RPV (rilpivirine or Edurant)    ! NO!     DOR (doravirine or Pifeltro)    ! NO!     ________________________________!_ __!______________________                                 !   !       PIs                       !    ! FPV (fos-amprenavir or Lexiva)  ! NO!     IDV (indinavir or Crixivan)     ! NO!     NFV (nelfinavir or Viracept)    ! NO!     SQV (saquinavir or Invirase)    ! NO!     LPV (lopinavir or Kaletra)      ! NO!     ATV (atazanavir or Reyataz)     ! NO!     TPV (tipranavir or Aptivus)     ! NO!     DRV (darunavir or Prezista)     ! NO!                                     !   ! ________________________________!___!______________________ PRB = PROBABLE OR EMERGING RESISTANCE OTHER MUTATIONS DETECTED: RT GENE MUTATIONS: R211K PR GENE MUTATIONS: F09N,A35T,D32K ___________________________________________________________ The [facility] Jan 2019 Interpretation Algorithm The method used in this test is RT-PCR and sequencing  of the HIV-1 polymerase gene. . The phrases "resistance predicted" and "probable  or emerging resistance" refer to the application of  the interpr etive rules. The FDA has not reviewed all of the interpretive rules used by the laboratory  to predict drug resistance. FDA may not currently  recognize some of the HIV gene mutations reported as  predictive of drug resistance, but the laboratory  considers these mutations to be associated with  resistance to anti-viral drugs based on current  clinical or scientific studies. The test has been  validated pursuant to CLIA regulations and is not  considered investigational or for research use only. Treatment decisions should be made in consideration of  all relevant clinical and laboratory findings and the prescribing information for the drugs. . This test was developed and its analytical performance characteristics have been determined by [facility]. It has not been cleared or approved by FDA. This assay has been validated pursuant to the CLIA regulations and is used for clinical purposes. Marland Kitchen     RADIOGRAPHIC STUDIES: I have personally reviewed the radiological images as listed and agreed with the findings  in the report. CT SOFT TISSUE NECK WO CONTRAST  Result Date: 06/14/2020 CLINICAL DATA:  69 year old female with palpable neck abnormality. Allergy to IV contrast. EXAM: CT NECK WITHOUT CONTRAST TECHNIQUE: Multidetector CT imaging of the neck was performed following the standard protocol without intravenous contrast. COMPARISON:  Brain MRI 12/05/2019. FINDINGS: Pharynx and larynx: Noncontrast pharynx and larynx are within normal  limits. Negative parapharyngeal and retropharyngeal spaces. Salivary glands: Negative noncontrast sublingual space, submandibular glands, parotid glands (note parotid space lymph nodes are described below). Thyroid: Generalized thyromegaly. No discrete thyroid nodule is evident. Lymph nodes: Abnormal increased number of mostly subcentimeter lymph nodes throughout the nodal stations in the bilateral neck. Bilateral parotid space lymph nodes also appear similarly abnormal. The largest nodes are up to 11 mm short axis. No cystic or necrotic nodes are evident in the absence of contrast. No associated regional soft tissue inflammatory stranding. Vascular: Bilateral cervical carotid calcified atherosclerosis. Vascular patency is not evaluated in the absence of IV contrast. Limited intracranial: Negative. Visualized orbits: Not included. Mastoids and visualized paranasal sinuses: Visualized paranasal sinuses and mastoids are stable and well pneumatized. Skeleton: Largely absent dentition. Mild chronic appearing T2, T3, and T4 compression fractures. No acute or suspicious osseous lesion identified. Upper chest: Negative visible lung apices. Abnormally increased in number and rounded bilateral axillary lymph nodes individually up to 13 mm short axis. No superior mediastinal lymphadenopathy is evident. IMPRESSION: 1. Bilateral cervical and axillary lymphadenopathy in the form of an increased number of round mostly sub-centimeter nodes. The appearance strongly suggests Leukemia, Lymphoma, or other  Lymphoproliferative disorder. 2. Thyromegaly.  No discrete thyroid nodule by CT. 3. Mild chronic appearing T2 through T4 compression fractures. Electronically Signed   By: Genevie Ann M.D.   On: 06/14/2020 22:47    ASSESSMENT:  1.  Lymphadenopathy of head and neck: -She was evaluated at the request of Dr. Mannie Stabile for lymphadenopathy in the neck and axillary region. -She has history of HIV under treatment. -She reportedly developed right-sided neck pain in September 2021 and was evaluated for it by Dr. Mannie Stabile.  His CT scan of the soft tissue neck without contrast on 06/14/2020 showed abnormal increased number of subcentimeter lymph nodes throughout the nodal stations in the bilateral neck with the largest lymph node measuring 11 mm in short axis.  Also bilateral axillary lymph nodes up to 13 mm in short axis. -She reports 15 pound weight loss in the last 6 months and was not trying to lose weight.  She reports some hot flashes but denies any fevers.  No recurrent infections noted.  2.  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  3.  Social/family history: -Lives at home with her daughter.  She is retired, used to work on [employer]. -She was a never smoker. -2 maternal aunts had cervical cancer and one maternal aunt had bone cancer.  4.  HIV: -She is on Prezista, Norvir and Tivicay.   PLAN:  1.  Lymphadenopathy of head and neck: -She does not have any significant B symptoms.  However she has weight loss of 15 pounds in the last 6 months. -Physical examination revealed subcentimeter lymph nodes in the neck region.  No palpable axillary lymph nodes. -Reviewed CT scan images with the patient from 06/14/2020. -Given her immunodeficiency status, I have recommended further work-up with a PET CT scan. -We will also check LDH and routine labs today. -We will see her back after the PET CT scan to discuss  further plan of action.     Derek Jack, MD 06/27/20 1:55 PM  [place] [phone]   I, Milinda Antis, am acting as a scribe for Dr. Sanda Linger.  I, Derek Jack MD, have reviewed the above documentation for accuracy and completeness, and I agree with the above.

## 2020-06-27 NOTE — Patient Instructions (Signed)
[place] at [facility] Discharge Instructions  You were seen and examined by Dr. Delton Coombes. Dr. Delton Coombes discussed your past medical history, family history of cancer and the events that led to you being here today.  You were referred to the [place] following your recent CT scan. On your recent CT scan, there were enlarged lymph nodes that could be related to cancer. Dr. Delton Coombes was unable to feel any prominent lymph nodes present. Dr. Delton Coombes does not feel confident that this is malignancy, it could be related to your HIV. To rule out malignancy, we will arrange some blood work as well as a PET scan, which is a specialized CT scan that illuminates cancer present in the body.  We will see you back following the PET scan.   Thank you for choosing [place] at [facility] to provide your oncology and hematology care.  To afford each patient quality time with our provider, please arrive at least 15 minutes before your scheduled appointment time.   If you have a lab appointment with the [place] please come in thru the Main Entrance and check in at the main information desk.  You need to re-schedule your appointment should you arrive 10 or more minutes late.  We strive to give you quality time with our providers, and arriving late affects you and other patients whose appointments are after yours.  Also, if you no show three or more times for appointments you may be dismissed from the clinic at the providers discretion.     Again, thank you for choosing [facility].  Our hope is that these requests will decrease the amount of time that you wait before being seen by our physicians.       _____________________________________________________________  Should you have questions after your visit to [facility], please contact our office at [phone] and follow the prompts.  Our office hours are 8:00 a.m. and  4:30 p.m. Monday - Friday.  Please note that voicemails left after 4:00 p.m. may not be returned until the following business day.  We are closed weekends and major holidays.  You do have access to a nurse 24-7, just call the main number to the clinic [phone] and do not press any options, hold on the line and a nurse will answer the phone.    For prescription refill requests, have your pharmacy contact our office and allow 72 hours.    Due to Covid, you will need to wear a mask upon entering the hospital. If you do not have a mask, a mask will be given to you at the Main Entrance upon arrival. For doctor visits, patients may have 1 support person age 22 or older with them. For treatment visits, patients can not have anyone with them due to social distancing guidelines and our immunocompromised population.

## 2020-06-27 NOTE — Progress Notes (Signed)
HPI: Laura Jimenez is a 69 y.o. female who presents to the [place] clinic for HIV follow-up.  Patient Active Problem List   Diagnosis Date Noted  . Acute pain of left shoulder 10/06/2018  . Hypothyroidism 10/23/2017  . Hyperlipidemia, mixed 08/11/2017  . CVA (cerebral vascular accident) ([place]) 08/11/2017  . Morbid obesity ([place]) 08/11/2017  . Vulvar cancer ([place]) 11/13/2011  . 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 History of CVA (cerebrovascular accident) 11/13/2011  . Post-menopausal bleeding 11/13/2011  . HIV INFECTION 09/08/2006  . CANDIDIASIS, VAGINAL 09/08/2006  . 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 PERIPHERAL NEUROPATHY 09/08/2006  . Essential hypertension 09/08/2006  . REFLUX ESOPHAGITIS 09/08/2006  . OVARIAN CYST 09/08/2006  . DYSPLASIA, CERVIX NOS 09/08/2006  . FOLLICULITIS 97/67/3419  . LYMPHADENOPATHY 09/08/2006  . PAP SMEAR, ABNORMAL 10/09/2000    Patient's Medications  New Prescriptions   No medications on file  Previous Medications   ASPIRIN (PX ENTERIC ASPIRIN) 81 MG EC TABLET    Take 1 tablet (81 mg total) by mouth daily. Swallow whole.   ATORVASTATIN (LIPITOR) 20 MG TABLET    Take 0.5 tablets (10 mg total) by mouth daily.   BIOFLAVONOID PRODUCTS (VITAMIN C) CHEW    Chew 1 tablet by mouth daily.   CHOLECALCIFEROL (VITAMIN D) 2000 UNITS CAPS    Take by mouth.   CLONIDINE (CATAPRES) 0.1 MG TABLET    Take 1 tablet (0.1 mg total) by mouth 2 (two) times daily.   COLCHICINE 0.6 MG TABLET    Take 0.6 mg by mouth 2 (two) times daily.   DARUNAVIR-COBICISTAT (PREZCOBIX) 800-150 MG TABLET    Take 1 tablet by mouth daily with breakfast.   DICYCLOMINE (BENTYL) 20 MG TABLET    Take 1 tablet (20 mg total) by mouth 2 (two) times daily.   DOLUTEGRAVIR (TIVICAY) 50 MG TABLET    Take 1 tablet (50 mg total) by mouth daily.   HYDROCHLOROTHIAZIDE (HYDRODIURIL) 25 MG TABLET    Take 25 mg by mouth daily.   LEVOTHYROXINE (SYNTHROID, LEVOTHROID) 50 MCG TABLET    Take 1 tablet (50 mcg  total) by mouth daily.   LISINOPRIL (PRINIVIL,ZESTRIL) 40 MG TABLET    Take 1 tablet (40 mg total) by mouth daily.   LOSARTAN POTASSIUM (COZAAR PO)    losartan   NAPROXEN (NAPROSYN) 375 MG TABLET    Take 1 tablet (375 mg total) by mouth 2 (two) times daily as needed for moderate pain.   NAPROXEN (NAPROSYN) 500 MG TABLET    Take 1 tablet (500 mg total) by mouth 2 (two) times daily with a meal.   ONDANSETRON (ZOFRAN) 4 MG TABLET    Take 1 tablet (4 mg total) by mouth every 8 (eight) hours as needed for nausea or vomiting.   PANTOPRAZOLE (PROTONIX) 40 MG TABLET    Take 40 mg by mouth daily.   PREDNISONE (DELTASONE) 10 MG TABLET    Take 2 tablets (20 mg total) by mouth 2 (two) times daily.   RANITIDINE (ZANTAC) 300 MG TABLET    Take by mouth.   TRAZODONE (DESYREL) 50 MG TABLET    Take 0.5-1 tablets (25-50 mg total) by mouth at bedtime as needed for sleep.   TRIAMTERENE-HYDROCHLOROTHIAZIDE (MAXZIDE-25) 37.5-25 MG TABLET    Take 1 tablet by mouth daily.   VALACYCLOVIR (VALTREX) 1000 MG TABLET    Take 1 tablet (1,000 mg total) by mouth 3 (three) times daily.   VITAMIN B-12 (CYANOCOBALAMIN) 1000 MCG TABLET    Take  1 tablet (1,000 mcg total) by mouth daily.  Modified Medications   No medications on file  Discontinued Medications   No medications on file    Allergies: Allergies  Allergen Reactions  . Gadolinium Derivatives Nausea And Vomiting    Vomited with multihance 12/05/19  . Ivp Dye [Iodinated Diagnostic Agents] Other (See Comments)    Caused kidney damage  . Penicillins Swelling  . Tape Dermatitis  . Cephalexin Rash  . Latex Rash    Past Medical History: Past Medical History:  Diagnosis Date  . Cancer ([place])   . CVA (cerebral infarction)   . HIV infection ([place])   . HTN (hypertension)   . Hyperlipidemia   . Morbid obesity with BMI of 40.0-44.9, adult ([place])   . Postmenopausal bleeding   . Stroke [facility])    no residual paralysis    Social History: Social History   Socioeconomic  History  . Marital status: Widowed    Spouse name: Not on file  . Number of children: Not on file  . Years of education: Not on file  . Highest education level: 10th grade  Occupational History  . Not on file  Tobacco Use  . Smoking status: Former Smoker    Packs/day: 1.00    Years: 5.00    Pack years: 5.00    Types: Cigarettes  . Smokeless tobacco: Never Used  Vaping Use  . Vaping Use: Never used  Substance and Sexual Activity  . Alcohol use: No  . Drug use: No  . Sexual activity: Not Currently    Birth control/protection: None, Post-menopausal  Other Topics Concern  . Not on file  Social History Narrative   Grew up in [place], [place].   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   2 children, boy and girl. Son is 85s, and special needs.   Daughter in her 50s, is a Marine scientist.   Eats all food groups.   Wears seatbelt.   Attends church.   Enjoys watching tv, listening to [employer], and spending time with friends.   Enjoys going out to eat.    Social Determinants of Health   Financial Resource Strain:   . Difficulty of Paying Living Expenses: Not on file  Food Insecurity:   . Worried About Charity fundraiser in the Last Year: Not on file  . Ran Out of Food in the Last Year: Not on file  Transportation Needs:   . Lack of Transportation (Medical): Not on file  . Lack of Transportation (Non-Medical): Not on file  Physical Activity:   . Days of Exercise per Week: Not on file  . Minutes of Exercise per Session: Not on file  Stress:   . Feeling of Stress : Not on file  Social Connections:   . Frequency of Communication with Friends and Family: Not on file  . Frequency of Social Gatherings with Friends and Family: Not on file  . Attends Religious Services: Not on file  . Active Member of Clubs or Organizations: Not on file  . Attends Archivist Meetings: Not on file  . Marital Status: Not on file    Labs: Lab Results  Component Value Date    HIV1RNAQUANT 11,100 (H) 06/04/2020   HIV1RNAQUANT 131000 (?) 05/08/2004   CD4TABS 261 (L) 06/04/2020   CD4TABS 250 11/14/1998    RPR and STI Lab Results  Component Value Date   LABRPR NON-REACTIVE 06/04/2020    STI Results GC CT  06/04/2020 Negative  Negative    Hepatitis B Lab Results  Component Value Date   HEPBSAB NON-REACTIVE 06/04/2020   HEPBSAG NON-REACTIVE 06/04/2020   HEPBCAB NON-REACTIVE 06/04/2020   Hepatitis C Lab Results  Component Value Date   HEPCAB NON-REACTIVE 06/04/2020   Hepatitis A Lab Results  Component Value Date   HAV REACTIVE (A) 06/04/2020   Lipids: Lab Results  Component Value Date   CHOL 198 06/04/2020   TRIG 146 06/04/2020   HDL 42 (L) 06/04/2020   CHOLHDL 4.7 06/04/2020   LDLCALC 130 (H) 06/04/2020    Current HIV Regimen: Prezcobix 800mg  once daily and Tivicay 50mg  once daily   Assessment: Ceclia presents to clinic today for HIV medication follow-up. She brought her home medications with her, and we reviewed her medication list. Her medication list should now be updated. She saw Dr. Gale Journey last week to transfer care from [facility] and was off her HIV medications for ~5 months. She stated she stopped taking her HIV medications due to nausea. Prior to further resistance information sent from [facility], Dr. Gale Journey switched her from Prezista/ritonavir twice daily + Tivicay  to Prezcobix + Tivicay to mitigate the nausea. Based on her updated resistance information, will transition her back to her previous regimen per Dr. Gale Journey.  Patient was very reluctant to switch back to this regimen given her previous nausea. We discussed switching her ritonavir to an oral solution for ease of administration. Will also prescribe ondansetron to be taken prior to ritonavir to further mitigate any nausea. She was upset that her medications were being switched back again, and I explained to her that it was due to her HIV resistance. She did not realize that we evaluate  her HIV resistance through looking at her history of genotyping rather than her one isolated genotype at this visit last week. She stated that she would immediately stop the ritonavir if she experienced any nausea. She is scheduled to follow-up with Dr. Gale Journey at the end of November, but I provided her Cassie's number/card. I told her to call us if she experiences any side effects and stops her medications and let us work through the issues with her. Counseled her on the importance of taking Prezista and ritonavir twice daily.    Plan: Switch HIV regimen back to Prezista/ritonavir twice daily + Tivicay once daily  Trial ritonavir oral solution and ondansetron pre-medication to prevent nausea Follow-up with Dr. Gale Journey on 12/6 (lab appointment 11/29)  Alfonse Spruce, PharmD PGY2 ID Pharmacy Resident [phone]  06/27/2020, 9:44 AM

## 2020-07-03 ENCOUNTER — Encounter (HOSPITAL_COMMUNITY): Payer: Self-pay | Admitting: General Practice

## 2020-07-03 NOTE — Progress Notes (Signed)
[facility] CSW Progress Notes  Call to patient at request of Stevphen Rochester, RN.  Patient reported stress concern on SDOH screening.  Patient reports that he stress comes from pain in her back - sometimes this pain causes difficulties with walking.  She is working w her PCP to seek medical attention for this and also reports she has a scan upcoming which should help clarify her situation.  Explained the availability of patient support and social work services - she will access [name] as needed should she need assistance.  Edwyna Shell, LCSW Clinical Social Worker Phone:  [phone]

## 2020-07-04 DIAGNOSIS — G5700 Lesion of sciatic nerve, unspecified lower limb: Secondary | ICD-10-CM | POA: Diagnosis not present

## 2020-07-16 ENCOUNTER — Encounter (HOSPITAL_COMMUNITY)
Admission: RE | Admit: 2020-07-16 | Discharge: 2020-07-16 | Disposition: A | Discharge status: Home / Self Care | Payer: Medicare Other | Source: Ambulatory Visit | Attending: Hematology | Admitting: Hematology

## 2020-07-16 ENCOUNTER — Other Ambulatory Visit: Payer: Self-pay

## 2020-07-16 DIAGNOSIS — R591 Generalized enlarged lymph nodes: Secondary | ICD-10-CM | POA: Diagnosis not present

## 2020-07-16 DIAGNOSIS — I517 Cardiomegaly: Secondary | ICD-10-CM | POA: Diagnosis not present

## 2020-07-16 DIAGNOSIS — C8590 Non-Hodgkin lymphoma, unspecified, unspecified site: Secondary | ICD-10-CM | POA: Diagnosis not present

## 2020-07-16 DIAGNOSIS — N281 Cyst of kidney, acquired: Secondary | ICD-10-CM | POA: Diagnosis not present

## 2020-07-16 DIAGNOSIS — K449 Diaphragmatic hernia without obstruction or gangrene: Secondary | ICD-10-CM | POA: Diagnosis not present

## 2020-07-16 MED ORDER — FLUDEOXYGLUCOSE F - 18 (FDG) INJECTION
17.9000 | Freq: Once | INTRAVENOUS | Status: AC | PRN
  Administered 2020-07-16: 17.9 via INTRAVENOUS

## 2020-07-18 ENCOUNTER — Inpatient Hospital Stay (HOSPITAL_BASED_OUTPATIENT_CLINIC_OR_DEPARTMENT_OTHER): Payer: Medicare Other | Admitting: Hematology

## 2020-07-18 ENCOUNTER — Other Ambulatory Visit: Payer: Self-pay

## 2020-07-18 VITALS — BP 180/83 | HR 66 | Temp 97.0°F | Resp 20 | Wt 246.6 lb

## 2020-07-18 DIAGNOSIS — R591 Generalized enlarged lymph nodes: Secondary | ICD-10-CM

## 2020-07-18 DIAGNOSIS — E785 Hyperlipidemia, unspecified: Secondary | ICD-10-CM | POA: Diagnosis not present

## 2020-07-18 DIAGNOSIS — I10 Essential (primary) hypertension: Secondary | ICD-10-CM | POA: Diagnosis not present

## 2020-07-18 DIAGNOSIS — R590 Localized enlarged lymph nodes: Secondary | ICD-10-CM | POA: Diagnosis not present

## 2020-07-18 DIAGNOSIS — Z87891 Personal history of nicotine dependence: Secondary | ICD-10-CM | POA: Diagnosis not present

## 2020-07-18 DIAGNOSIS — Z8673 Personal history of transient ischemic attack (TIA), and cerebral infarction without residual deficits: Secondary | ICD-10-CM | POA: Diagnosis not present

## 2020-07-18 NOTE — Patient Instructions (Signed)
[place] at [facility] Discharge Instructions  You were seen today by Dr. Delton Coombes. He went over your recent results and scans. You will be referred to a general surgeon for a biopsy of a lymph node for further analysis. Dr. Delton Coombes will see you back after the biopsy results for labs and follow up.   Thank you for choosing [place] at [facility] to provide your oncology and hematology care.  To afford each patient quality time with our provider, please arrive at least 15 minutes before your scheduled appointment time.   If you have a lab appointment with the [place] please come in thru the Main Entrance and check in at the main information desk  You need to re-schedule your appointment should you arrive 10 or more minutes late.  We strive to give you quality time with our providers, and arriving late affects you and other patients whose appointments are after yours.  Also, if you no show three or more times for appointments you may be dismissed from the clinic at the providers discretion.     Again, thank you for choosing [facility].  Our hope is that these requests will decrease the amount of time that you wait before being seen by our physicians.       _____________________________________________________________  Should you have questions after your visit to [facility], please contact our office at [phone] between the hours of 8:00 a.m. and 4:30 p.m.  Voicemails left after 4:00 p.m. will not be returned until the following business day.  For prescription refill requests, have your pharmacy contact our office and allow 72 hours.    Cancer Center Support Programs:   > Cancer Support Group  2nd Tuesday of the month 1pm-2pm, Journey Room

## 2020-07-18 NOTE — Progress Notes (Signed)
[employer] [place], [place] [address]   CLINIC:  Medical Oncology/Hematology  PCP:  Caren Macadam, [place] / [place] [address]  [phone]  REASON FOR VISIT:  Follow-up for lymphadenopathy of head and neck  PRIOR THERAPY: None  CURRENT THERAPY: Under work-up  INTERVAL HISTORY:  Laura Jimenez, a 69 y.o. female, returns for routine follow-up for her lymphadenopathy of head and neck. Emery was last seen on 06/27/2020.  Today she reports feeling good. She denies having night sweats. Her appetite is excellent.   REVIEW OF SYSTEMS:  Review of Systems  Constitutional: Positive for fatigue (75%). Negative for appetite change and diaphoresis.  Cardiovascular: Positive for chest pain (chest muscle pain).  Gastrointestinal: Positive for constipation (intermittent) and diarrhea (intermittent).  Musculoskeletal: Positive for myalgias (4/10 generalized pain).  All other systems reviewed and are negative.   PAST MEDICAL/SURGICAL HISTORY:  Past Medical History:  Diagnosis Date  . Cancer ([place])   . CVA (cerebral infarction)   . HIV infection ([place])   . HTN (hypertension)   . Hyperlipidemia   . Morbid obesity with BMI of 40.0-44.9, adult ([place])   . Postmenopausal bleeding   . Stroke [facility])    no residual paralysis   Past Surgical History:  Procedure Laterality Date  . COLONOSCOPY  2000  . HYSTEROSCOPY WITH D & C N/A 04/19/2013   Procedure: DILATATION AND CURETTAGE /HYSTEROSCOPY;  Surgeon: Emily Filbert, MD;  Location: [facility];  Service: Gynecology;  Laterality: N/A;    SOCIAL HISTORY:  Social History   Socioeconomic History  . Marital status: Widowed    Spouse name: Not on file  . Number of children: Not on file  . Years of education: Not on file  . Highest education level: 10th grade  Occupational History  . Not on file  Tobacco Use  . Smoking status: Former Smoker    Packs/day: 1.00    Years: 5.00    Pack years:  5.00    Types: Cigarettes  . Smokeless tobacco: Never Used  Vaping Use  . Vaping Use: Never used  Substance and Sexual Activity  . Alcohol use: No  . Drug use: No  . Sexual activity: Not Currently    Birth control/protection: None, Post-menopausal  Other Topics Concern  . Not on file  Social History Narrative   Grew up in [place], [place].   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   2 children, boy and girl. Son is 54s, and special needs.   Daughter in her 74s, is a Marine scientist.   Eats all food groups.   Wears seatbelt.   Attends church.   Enjoys watching tv, listening to [employer], and spending time with friends.   Enjoys going out to eat.    Social Determinants of Health   Financial Resource Strain: Low Risk   . Difficulty of Paying Living Expenses: Not hard at all  Food Insecurity: No Food Insecurity  . Worried About Charity fundraiser in the Last Year: Never true  . Ran Out of Food in the Last Year: Never true  Transportation Needs: No Transportation Needs  . Lack of Transportation (Medical): No  . Lack of Transportation (Non-Medical): No  Physical Activity: Sufficiently Active  . Days of Exercise per Week: 7 days  . Minutes of Exercise per Session: 60 min  Stress: Stress Concern Present  . Feeling of Stress : To some extent  Social Connections:  Moderately Isolated  . Frequency of Communication with Friends and Family: More than three times a week  . Frequency of Social Gatherings with Friends and Family: Once a week  . Attends Religious Services: More than 4 times per year  . Active Member of Clubs or Organizations: No  . Attends Archivist Meetings: Never  . Marital Status: Widowed  Intimate Partner Violence: Not At Risk  . Fear of Current or Ex-Partner: No  . Emotionally Abused: No  . Physically Abused: No  . Sexually Abused: No    FAMILY HISTORY:  Family History  Problem Relation Age of Onset  . Hypertension Mother   . Hypertension  Sister   . Arthritis Sister   . Cancer Other   . Diabetes Other     CURRENT MEDICATIONS:  Current Outpatient Medications  Medication Sig Dispense Refill  . aspirin (PX ENTERIC ASPIRIN) 81 MG EC tablet Take 1 tablet (81 mg total) by mouth daily. Swallow whole. 30 tablet 12  . atorvastatin (LIPITOR) 20 MG tablet Take 0.5 tablets (10 mg total) by mouth daily. 90 tablet 3  . Bioflavonoid Products (VITAMIN C) CHEW Chew 1 tablet by mouth daily.     . cloNIDine (CATAPRES) 0.1 MG tablet Take 1 tablet (0.1 mg total) by mouth 2 (two) times daily. 60 tablet 1  . colchicine 0.6 MG tablet Take 0.6 mg by mouth as needed (for gout flares).     . darunavir (PREZISTA) 600 MG tablet Take 1 tablet (600 mg total) by mouth 2 (two) times daily. 60 tablet 1  . dolutegravir (TIVICAY) 50 MG tablet Take 1 tablet (50 mg total) by mouth daily. 30 tablet 5  . hydrochlorothiazide (HYDRODIURIL) 25 MG tablet Take 25 mg by mouth daily.     Marland Kitchen ibuprofen (ADVIL) 200 MG tablet Take 200 mg by mouth every 6 (six) hours as needed.     . ondansetron (ZOFRAN) 4 MG tablet Take 1 tablet (4 mg total) by mouth 2 (two) times daily as needed for nausea. Take 30-60 minutes prior to taking ritonavir solution 60 tablet 1  . pantoprazole (PROTONIX) 40 MG tablet Take 40 mg by mouth daily.    . ritonavir (NORVIR) 80 MG/ML solution Take 1.3 mLs (104 mg total) by mouth 2 (two) times daily with a meal. 78 mL 1  . Vitamin D, Ergocalciferol, (DRISDOL) 1.25 MG (50000 UNIT) CAPS capsule Take 50,000 Units by mouth every 7 (seven) days.     No current facility-administered medications for this visit.    ALLERGIES:  Allergies  Allergen Reactions  . Gadolinium Derivatives Nausea And Vomiting    Vomited with multihance 12/05/19  . Ivp Dye [Iodinated Diagnostic Agents] Other (See Comments)    Caused kidney damage  . Penicillins Swelling  . Tape Dermatitis  . Cephalexin Rash  . Latex Rash    PHYSICAL EXAM:  Performance status (ECOG): 1 -  Symptomatic but completely ambulatory  Vitals:   07/18/20 1206  BP: (!) 180/83  Pulse: 66  Resp: 20  Temp: (!) 97 F (36.1 C)  SpO2: 99%   Wt Readings from Last 3 Encounters:  07/18/20 246 lb 9.6 oz (111.9 kg)  06/27/20 250 lb 3.2 oz (113.5 kg)  06/21/20 249 lb (112.9 kg)   Physical Exam Vitals reviewed.  Constitutional:      Appearance: Normal appearance. She is obese.  Neurological:     General: No focal deficit present.     Mental Status: She is alert and oriented to  person, place, and time.  Psychiatric:        Mood and Affect: Mood normal.        Behavior: Behavior normal.     LABORATORY DATA:  I have reviewed the labs as listed.  CBC Latest Ref Rng & Units 06/27/2020 06/04/2020 10/09/2019  WBC 4.0 - 10.5 K/uL 5.2 4.4 6.9  Hemoglobin 12.0 - 15.0 g/dL 10.9(L) 11.6(L) 11.6(L)  Hematocrit 36 - 46 % 35.3(L) 35.7 37.9  Platelets 150 - 400 K/uL 101(L) 139(L) 257   CMP Latest Ref Rng & Units 06/27/2020 06/04/2020 10/09/2019  Glucose 70 - 99 mg/dL 104(H) 104(H) 90  BUN 8 - 23 mg/dL 10 12 9   Creatinine 0.44 - 1.00 mg/dL 0.85 0.81 0.91  Sodium 135 - 145 mmol/L 136 138 138  Potassium 3.5 - 5.1 mmol/L 3.5 4.1 3.6  Chloride 98 - 111 mmol/L 105 105 104  CO2 22 - 32 mmol/L 26 29 27   Calcium 8.9 - 10.3 mg/dL 8.5(L) 9.4 9.0  Total Protein 6.5 - 8.1 g/dL 8.5(H) 8.3(H) -  Total Bilirubin 0.3 - 1.2 mg/dL 0.7 0.6 -  Alkaline Phos 38 - 126 U/L 61 - -  AST 15 - 41 U/L 24 17 -  ALT 0 - 44 U/L 15 9 -      Component Value Date/Time   RBC 3.63 (L) 06/27/2020 1439   MCV 97.2 06/27/2020 1439   MCH 30.0 06/27/2020 1439   MCHC 30.9 06/27/2020 1439   RDW 14.0 06/27/2020 1439   LYMPHSABS 3.2 06/27/2020 1439   MONOABS 0.5 06/27/2020 1439   EOSABS 0.0 06/27/2020 1439   BASOSABS 0.0 06/27/2020 1439   Lab Results  Component Value Date   LDH 182 06/27/2020   LDH 148 06/29/2018    DIAGNOSTIC IMAGING:  I have independently reviewed the scans and discussed with the patient. NM PET  Image Initial (PI) Skull Base To Thigh  Result Date: 07/16/2020 CLINICAL DATA:  Initial treatment strategy for lymphoma. EXAM: NUCLEAR MEDICINE PET SKULL BASE TO THIGH TECHNIQUE: 17.9 mCi F-18 FDG was injected intravenously. Full-ring PET imaging was performed from the skull base to thigh after the radiotracer. CT data was obtained and used for attenuation correction and anatomic localization. Fasting blood glucose: 94 mg/dl COMPARISON:  CT neck 06/14/2020 FINDINGS: Mediastinal blood pool activity: SUV max 2.3 Liver activity: SUV max 3.7 NECK: Numerous small but hypermetabolic bilateral lymph nodes scattered in the neck, particularly station 2, station 3, station 5, and station 4, as well as several hypermetabolic deep parotid lymph nodes. An index cluster of 2 left level V lymph nodes measuring 0.7 cm short axis on image 46 of series 3 has maximum SUV of 5.3, Deauville 4. Index right level IIa lymph node measuring 0.9 cm in short axis on image 37 of series 3 has maximum SUV of 4.0, Deauville 4. Diffuse thyroid activity, most commonly encountered in the setting of thyroiditis. Incidental CT findings: Mild bilateral common carotid atherosclerotic calcification. CHEST: Small but hypermetabolic bilateral axillary and subpectoral lymph nodes, along with a couple of subcutaneous lymph nodes adjacent to the deltoid musculature in the upper arms. Index elongated right axillary node measuring 0.9 cm in short axis on image 90 of series 3 has maximum SUV of 6.4, Deauville 4. No significant mediastinal adenopathy. Incidental CT findings: Small to moderate-sized hiatal hernia. Atherosclerotic aortic arch. Mild cardiomegaly. ABDOMEN/PELVIS: Hypermetabolic common iliac, external iliac, and inguinal lymph nodes bilaterally. Index left external iliac node 1.4 cm in short axis on image 256  of series 3 with maximum SUV 6.9, Deauville 4. Index right lower inguinal lymph node 1.8 cm in short axis on image 299 of series 3, maximum  SUV 5.2, Deauville 4. There is no splenomegaly, but the spleen does have higher activity in the liver, background splenic activity 5.0. Incidental CT findings: Photopenic exophytic left kidney lower pole cyst. Aortoiliac atherosclerotic vascular disease. SKELETON: No significant abnormal hypermetabolic activity in this region. 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 IMPRESSION: 1. Scattered hypermetabolic adenopathy throughout the neck, axillary and subpectoral stations in the chest, and in the iliac and inguinal chains in the pelvis. Most of this adenopathy is Deauville 4. 2. No splenomegaly, but the spleen has moderately higher activity in general than the liver. 3. Other imaging findings of potential clinical significance: Aortic Atherosclerosis (ICD10-I70.0). Small to moderate-sized hiatal hernia. Mild cardiomegaly. Suspected pars defects at L5. Electronically Signed   By: Van Clines M.D.   On: 07/16/2020 21:50     ASSESSMENT:  1.  Lymphadenopathy of head and neck: -She was evaluated at the request of Dr. Mannie Stabile for lymphadenopathy in the neck and axillary region. -She has history of HIV under treatment. -She reportedly developed right-sided neck pain in September 2021 and was evaluated for it by Dr. Mannie Stabile.  His CT scan of the soft tissue neck without contrast on 06/14/2020 showed abnormal increased number of subcentimeter lymph nodes throughout the nodal stations in the bilateral neck with the largest lymph node measuring 11 mm in short axis.  Also bilateral axillary lymph nodes up to 13 mm in short axis. -She reports 15 pound weight loss in the last 6 months and was not trying to lose weight.  She reports some hot flashes but denies any fevers.  No recurrent infections noted.  2.  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  3.   Social/family history: -Lives at home with her daughter.  She is retired, used to work on Microbiologist. -She was a never smoker. -2 maternal aunts had cervical cancer and one maternal aunt had bone cancer.  4.  HIV: -She is on Prezista, Norvir and Tivicay.   PLAN:  1.  Lymphadenopathy of head and neck: -No B symptoms or infections. -Weight loss of 15 pounds in the last 6 months. -Labs reveal normal LDH. -PET scan on July 16, 2020 shows scattered hypermetabolic adenopathy throughout the neck, axillary and subpectoral stations.  Adenopathy in the chest, iliac and inguinal regions.  No splenomegaly but spleen has moderately higher activity compared to the liver. -Given her immunocompromise state and high risk for lymphoproliferative disorders, recommended lymph node biopsy. -We will make a referral to general surgery. -We will see her back after the biopsy to discuss results and further plan.  Orders placed this encounter:  No orders of the defined types were placed in this encounter.    Derek Jack, MD [place] [phone]   I, Milinda Antis, am acting as a scribe for Dr. Sanda Linger.  I, Derek Jack MD, have reviewed the above documentation for accuracy and completeness, and I agree with the above.

## 2020-07-23 ENCOUNTER — Other Ambulatory Visit: Payer: Medicare Other

## 2020-07-23 ENCOUNTER — Other Ambulatory Visit: Payer: Self-pay

## 2020-07-23 DIAGNOSIS — R21 Rash and other nonspecific skin eruption: Secondary | ICD-10-CM | POA: Diagnosis not present

## 2020-07-23 DIAGNOSIS — Z8719 Personal history of other diseases of the digestive system: Secondary | ICD-10-CM

## 2020-07-23 DIAGNOSIS — B20 Human immunodeficiency virus [HIV] disease: Secondary | ICD-10-CM

## 2020-07-26 LAB — COMPLETE METABOLIC PANEL WITH GFR
AG Ratio: 0.6 (calc) — ABNORMAL LOW (ref 1.0–2.5)
ALT: 10 U/L (ref 6–29)
AST: 17 U/L (ref 10–35)
Albumin: 3.5 g/dL — ABNORMAL LOW (ref 3.6–5.1)
Alkaline phosphatase (APISO): 68 U/L (ref 37–153)
BUN: 11 mg/dL (ref 7–25)
CO2: 28 mmol/L (ref 20–32)
Calcium: 9.1 mg/dL (ref 8.6–10.4)
Chloride: 106 mmol/L (ref 98–110)
Creat: 0.93 mg/dL (ref 0.50–0.99)
GFR, Est African American: 74 mL/min/{1.73_m2} (ref >=60)
GFR, Est Non African American: 64 mL/min/{1.73_m2} (ref >=60)
Globulin: 5.4 g/dL (calc) — ABNORMAL HIGH (ref 1.9–3.7)
Glucose, Bld: 124 mg/dL — ABNORMAL HIGH (ref 65–99)
Potassium: 3.9 mmol/L (ref 3.5–5.3)
Sodium: 138 mmol/L (ref 135–146)
Total Bilirubin: 0.6 mg/dL (ref 0.2–1.2)
Total Protein: 8.9 g/dL — ABNORMAL HIGH (ref 6.1–8.1)

## 2020-07-26 LAB — CBC WITH DIFFERENTIAL/PLATELET
Absolute Monocytes: 379 cells/uL (ref 200–950)
Basophils Absolute: 48 cells/uL (ref 0–200)
Basophils Relative: 1 %
Eosinophils Absolute: 38 cells/uL (ref 15–500)
Eosinophils Relative: 0.8 %
HCT: 36.2 % (ref 35.0–45.0)
Hemoglobin: 11.7 g/dL (ref 11.7–15.5)
Lymphs Abs: 2741 cells/uL (ref 850–3900)
MCH: 29.9 pg (ref 27.0–33.0)
MCHC: 32.3 g/dL (ref 32.0–36.0)
MCV: 92.6 fL (ref 80.0–100.0)
MPV: 10.7 fL (ref 7.5–12.5)
Monocytes Relative: 7.9 %
Neutro Abs: 1594 cells/uL (ref 1500–7800)
Neutrophils Relative %: 33.2 %
Platelets: 208 10*3/uL (ref 140–400)
RBC: 3.91 10*6/uL (ref 3.80–5.10)
RDW: 13 % (ref 11.0–15.0)
Total Lymphocyte: 57.1 %
WBC: 4.8 10*3/uL (ref 3.8–10.8)

## 2020-07-26 LAB — HIV-1 RNA QUANT-NO REFLEX-BLD
HIV 1 RNA Quant: 653 Copies/mL — ABNORMAL HIGH
HIV-1 RNA Quant, Log: 2.81 Log cps/mL — ABNORMAL HIGH

## 2020-07-30 ENCOUNTER — Ambulatory Visit: Payer: Medicare Other | Admitting: Internal Medicine

## 2020-08-02 DIAGNOSIS — H25812 Combined forms of age-related cataract, left eye: Secondary | ICD-10-CM | POA: Diagnosis not present

## 2020-08-02 DIAGNOSIS — Z01818 Encounter for other preprocedural examination: Secondary | ICD-10-CM | POA: Diagnosis not present

## 2020-08-07 ENCOUNTER — Other Ambulatory Visit: Payer: Medicare Other

## 2020-08-07 ENCOUNTER — Other Ambulatory Visit: Payer: Self-pay

## 2020-08-07 DIAGNOSIS — Z20822 Contact with and (suspected) exposure to covid-19: Secondary | ICD-10-CM

## 2020-08-08 ENCOUNTER — Ambulatory Visit: Payer: Medicare Other | Admitting: Internal Medicine

## 2020-08-08 LAB — NOVEL CORONAVIRUS, NAA: SARS-CoV-2, NAA: NOT DETECTED

## 2020-08-08 LAB — SARS-COV-2, NAA 2 DAY TAT

## 2020-08-09 ENCOUNTER — Telehealth: Payer: Self-pay

## 2020-08-09 ENCOUNTER — Ambulatory Visit: Payer: Medicare Other | Admitting: General Surgery

## 2020-08-09 DIAGNOSIS — I10 Essential (primary) hypertension: Secondary | ICD-10-CM | POA: Diagnosis not present

## 2020-08-09 DIAGNOSIS — R599 Enlarged lymph nodes, unspecified: Secondary | ICD-10-CM | POA: Diagnosis not present

## 2020-08-09 DIAGNOSIS — L989 Disorder of the skin and subcutaneous tissue, unspecified: Secondary | ICD-10-CM | POA: Diagnosis not present

## 2020-08-09 NOTE — Telephone Encounter (Signed)
-----   Message from Jabier Mutton, MD sent at 08/09/2020  2:23 PM EST ----- Patient appears to be doing better with her virologic control on current ART   Please schedule for her to see me this month or 08/2020  Thank you

## 2020-08-09 NOTE — Telephone Encounter (Signed)
Patient made aware of lab results and accepts month follow up appointment with Dr. Gale Journey. Eugenia Mcalpine

## 2020-08-16 ENCOUNTER — Other Ambulatory Visit (HOSPITAL_COMMUNITY): Payer: Self-pay

## 2020-08-16 DIAGNOSIS — R591 Generalized enlarged lymph nodes: Secondary | ICD-10-CM

## 2020-08-20 ENCOUNTER — Other Ambulatory Visit (HOSPITAL_COMMUNITY): Payer: Medicare Other

## 2020-08-20 ENCOUNTER — Inpatient Hospital Stay (HOSPITAL_COMMUNITY): Payer: Medicare Other | Attending: Hematology | Admitting: Hematology

## 2020-08-28 ENCOUNTER — Encounter: Payer: Self-pay | Admitting: General Surgery

## 2020-08-28 ENCOUNTER — Other Ambulatory Visit: Payer: Self-pay

## 2020-08-28 ENCOUNTER — Ambulatory Visit (INDEPENDENT_AMBULATORY_CARE_PROVIDER_SITE_OTHER): Payer: Medicare Other | Admitting: General Surgery

## 2020-08-28 VITALS — BP 155/85 | HR 59 | Temp 98.2°F | Resp 16 | Ht 64.0 in | Wt 243.0 lb

## 2020-08-28 DIAGNOSIS — R599 Enlarged lymph nodes, unspecified: Secondary | ICD-10-CM

## 2020-08-28 NOTE — Progress Notes (Signed)
[facility] History and Physical  Reason for Referral: Adenopathy  Referring Physician:  Dr. Delton Coombes   Chief Complaint    New Patient (Initial Visit)      Laura Jimenez is a 70 y.o. female.  HPI: [name] is a 70 yo with history of HIV, HTN, obesity, vulvar cancer, and prior CVA who comes in with her daughter. She had noticed some enlarged lymph nodes in her neck a few months back and these were worked up with CT. There are subcentimeter nodes in the neck, and she was referred to Dr. Delton Coombes.  She underwent a PET due to concern for lymphoma given adenopathy appreciated clinically and her history of HIV. She needs a lymph node biopsy in order to get a diagnosis.  She had a PET scan that demonstrates a large superficial node in the right inguinal region.   Past Medical History:  Diagnosis Date  . Cancer ([place])   . CVA (cerebral infarction)   . HIV infection ([place])   . HTN (hypertension)   . Hyperlipidemia   . Morbid obesity with BMI of 40.0-44.9, adult ([place])   . Postmenopausal bleeding   . Stroke [facility])    no residual paralysis    Past Surgical History:  Procedure Laterality Date  . COLONOSCOPY  2000  . HYSTEROSCOPY WITH D & C N/A 04/19/2013   Procedure: DILATATION AND CURETTAGE /HYSTEROSCOPY;  Surgeon: Emily Filbert, MD;  Location: [facility];  Service: Gynecology;  Laterality: N/A;    Family History  Problem Relation Age of Onset  . Hypertension Mother   . Hypertension Sister   . Arthritis Sister   . Cancer Other   . Diabetes Other     Social History   Tobacco Use  . Smoking status: Former Smoker    Packs/day: 1.00    Years: 5.00    Pack years: 5.00    Types: Cigarettes  . Smokeless tobacco: Never Used  Vaping Use  . Vaping Use: Never used  Substance Use Topics  . Alcohol use: No  . Drug use: No    Medications: I have reviewed the patient's current medications. Allergies as of 08/28/2020      Reactions   Gadolinium Derivatives Nausea  And Vomiting   Vomited with multihance 12/05/19   Ivp Dye [iodinated Diagnostic Agents] Other (See Comments)   Caused kidney damage   Penicillins Swelling   Tape Dermatitis   Cephalexin Rash   Latex Rash      Medication List       Accurate as of August 28, 2020 11:59 PM. If you have any questions, ask your nurse or doctor.        aspirin 81 MG EC tablet Commonly known as: PX Enteric Aspirin Take 1 tablet (81 mg total) by mouth daily. Swallow whole.   atorvastatin 20 MG tablet Commonly known as: LIPITOR Take 0.5 tablets (10 mg total) by mouth daily.   cloNIDine 0.1 MG tablet Commonly known as: Catapres Take 1 tablet (0.1 mg total) by mouth 2 (two) times daily.   colchicine 0.6 MG tablet Take 0.6 mg by mouth as needed (for gout flares).   darunavir 600 MG tablet Commonly known as: Prezista Take 1 tablet (600 mg total) by mouth 2 (two) times daily.   hydrochlorothiazide 25 MG tablet Commonly known as: HYDRODIURIL Take 25 mg by mouth daily.   ibuprofen 200 MG tablet Commonly known as: ADVIL Take 200 mg by mouth every 6 (six) hours as needed.  ondansetron 4 MG tablet Commonly known as: Zofran Take 1 tablet (4 mg total) by mouth 2 (two) times daily as needed for nausea. Take 30-60 minutes prior to taking ritonavir solution   pantoprazole 40 MG tablet Commonly known as: PROTONIX Take 40 mg by mouth daily.   ritonavir 80 MG/ML solution Commonly known as: NORVIR Take 1.3 mLs (104 mg total) by mouth 2 (two) times daily with a meal.   Tivicay 50 MG tablet Generic drug: dolutegravir Take 1 tablet (50 mg total) by mouth daily.   Vitamin C Chew Chew 1 tablet by mouth daily.   Vitamin D (Ergocalciferol) 1.25 MG (50000 UNIT) Caps capsule Commonly known as: DRISDOL Take 50,000 Units by mouth every 7 (seven) days.        ROS:  A comprehensive review of systems was negative except for: Musculoskeletal: positive for back pain, neck pain and joint  pain Neurological: positive for numbness  Blood pressure (!) 155/85, pulse (!) 59, temperature 98.2 F (36.8 C), temperature source Oral, resp. rate 16, height 5\' 4"  (1.626 m), weight 243 lb (110.2 kg), SpO2 98 %. Physical Exam Vitals reviewed.  HENT:     Head: Normocephalic.     Nose: Nose normal.     Mouth/Throat:     Mouth: Mucous membranes are moist.  Eyes:     Extraocular Movements: Extraocular movements intact.  Cardiovascular:     Rate and Rhythm: Normal rate and regular rhythm.  Pulmonary:     Effort: Pulmonary effort is normal.  Chest:  Breasts:     Right: No axillary adenopathy or supraclavicular adenopathy.     Left: No axillary adenopathy or supraclavicular adenopathy.    Abdominal:     General: There is no distension.     Palpations: Abdomen is soft.     Tenderness: There is no abdominal tenderness.     Comments: Obese, panus  Musculoskeletal:        General: Normal range of motion.     Cervical back: Normal range of motion.  Lymphadenopathy:     Cervical: Cervical adenopathy present.     Upper Body:     Right upper body: No supraclavicular or axillary adenopathy.     Left upper body: No supraclavicular or axillary adenopathy.     Lower Body: Right inguinal adenopathy present. Left inguinal adenopathy present.     Comments: Some cervical adenopathy and sub mandibular but nothing super large, difficult to appreciate any adenopathy in the axilla, bilateral groin with some adenopathy  Neurological:     General: No focal deficit present.     Mental Status: She is alert and oriented to person, place, and time.  Psychiatric:        Mood and Affect: Mood normal.        Behavior: Behavior normal.        Thought Content: Thought content normal.        Judgment: Judgment normal.     Results: Personally reviewed CT neck and PET scans- largest node is about 2cm in the right inguinal region, it is a little lower and other nodes are in this area just superior.    Assessment & Plan:  Laura Jimenez is a 70 y.o. female with adenopathy of unknown origin and concern for lymphoma. She needs a lymph node biopsy. We discussed excisional lymph node biopsy and the risk of bleeding, infection, lymph leak, and not getting a diagnosis. She was hesitant at first but wants to proceed. Discussed preop COVID  testing. Her daughter is a wound care RN and will be able to monitor her inguinal incision which is at higher risk to get infections due to her obesity but still the best place to excise.   All questions were answered to the satisfaction of the patient and family.   Lucretia Roers 08/31/2020, 7:33 AM

## 2020-08-28 NOTE — Patient Instructions (Signed)
Call if you decide to undergo a biopsy and we can get it scheduled. We will let Dr. Tomie China office know that you are thinking about options.   Open Lymph Node Biopsy An open lymph node biopsy is a procedure to remove a lymph node so that it can be examined under a microscope. Lymph nodes are part of the body's disease-fighting system (immune system). The immune system protects the body from infections, germs, and diseases. An open lymph node biopsy may be done to:  Look for germs or cancer cells in your lymph node.  Find out why your lymph node is swollen.  Find out more about a condition you have. Lymph nodes are found in many locations in the body. Biopsies are often done on lymph nodes in the head, neck, armpit, or groin. Tell a health care provider about:  Any allergies you have.  All medicines you are taking, including vitamins, herbs, eye drops, creams, and over-the-counter medicines.  Any problems you or family members have had with anesthetic medicines.  Any blood disorders you have.  Any surgeries you have had.  Any medical conditions you have or have had.  Whether you are pregnant or may be pregnant. What are the risks? Generally, this is a safe procedure. However, problems may occur, including:  Infection.  Bleeding.  Allergic reactions to medicines.  Damage to other structures or organs, such as a nerve.  Scarring. What happens before the procedure? Medicines Ask your health care provider about:  Changing or stopping your regular medicines. This is especially important if you are taking diabetes medicines or blood thinners.  Taking medicines such as aspirin and ibuprofen. These medicines can thin your blood. Do not take these medicines unless your health care provider tells you to take them.  Taking over-the-counter medicines, vitamins, herbs, and supplements. General instructions  Follow instructions from your health care provider about eating or  drinking restrictions.  You may have an exam or testing.  You may have a blood or urine sample taken.  Plan to have someone take you home from the hospital or clinic.  If you will be going home right after the procedure, plan to have someone with you for 24 hours.  Ask your health care provider how your surgical site will be marked or identified.  Ask your health care provider what steps will be taken to help prevent infection. These may include: ? Removing hair at the biopsy site. ? Washing skin with a germ-killing soap. ? Taking antibiotic medicine. What happens during the procedure?   An IV will be inserted into one of your veins.  You will be given one or more of the following: ? A medicine to help you relax (sedative). ? A medicine to numb the area (local anesthetic).  An incision will be made in the area where your lymph node is located.  Your lymph node will be removed.  Your incision will be closed with stitches (sutures).  An antibiotic ointment may be applied to your incision.  A bandage (dressing) will be placed over your incision. The procedure may vary among health care providers and hospitals. What happens after the procedure?  Your blood pressure, heart rate, breathing rate, and blood oxygen level will be monitored until you leave the hospital or clinic.  Do not drive for 24 hours if you were given a sedative during your procedure.  It is up to you to get the results of your procedure. Ask your health care provider, or the  department that is doing the procedure, when your results will be ready. Summary  An open lymph node biopsy is a procedure to remove a lymph node so that it can be checked for infections, germs, and disease.  Generally, this is a safe procedure. However, problems may occur, including bleeding, infection, allergic reaction to medicines, and damage to other structures or organs.  Follow your health care provider's instructions before the  procedure. These may include changing or stopping some medicines and restricting what you eat and drink.  During the procedure, an incision will be made in the area of the lymph node, the lymph node will be removed, and the incision will be closed with sutures.  You will be monitored after the procedure. Do not drive for 24 hours if you were given a sedative during your procedure. This information is not intended to replace advice given to you by your health care provider. Make sure you discuss any questions you have with your health care provider. Document Revised: 03/18/2018 Document Reviewed: 03/18/2018 Elsevier Patient Education  2020 [employer].

## 2020-08-31 NOTE — H&P (Signed)
[facility] History and Physical  Reason for Referral: Adenopathy  Referring Physician:  Dr. Delton Coombes   Chief Complaint    New Patient (Initial Visit)      Laura Jimenez is a 70 y.o. female.  HPI: Laura Jimenez is a 70 yo with history of HIV, HTN, obesity, vulvar cancer, and prior CVA who comes in with her daughter. She had noticed some enlarged lymph nodes in her neck a few months back and these were worked up with CT. There are subcentimeter nodes in the neck, and she was referred to Dr. Delton Coombes.  She underwent a PET due to concern for lymphoma given adenopathy appreciated clinically and her history of HIV. She needs a lymph node biopsy in order to get a diagnosis.  She had a PET scan that demonstrates a large superficial node in the right inguinal region.   Past Medical History:  Diagnosis Date  . Cancer ([place])   . CVA (cerebral infarction)   . HIV infection ([place])   . HTN (hypertension)   . Hyperlipidemia   . Morbid obesity with BMI of 40.0-44.9, adult ([place])   . Postmenopausal bleeding   . Stroke [facility])    no residual paralysis    Past Surgical History:  Procedure Laterality Date  . COLONOSCOPY  2000  . HYSTEROSCOPY WITH D & C N/A 04/19/2013   Procedure: DILATATION AND CURETTAGE /HYSTEROSCOPY;  Surgeon: Emily Filbert, MD;  Location: [facility];  Service: Gynecology;  Laterality: N/A;    Family History  Problem Relation Age of Onset  . Hypertension Mother   . Hypertension Sister   . Arthritis Sister   . Cancer Other   . Diabetes Other     Social History   Tobacco Use  . Smoking status: Former Smoker    Packs/day: 1.00    Years: 5.00    Pack years: 5.00    Types: Cigarettes  . Smokeless tobacco: Never Used  Vaping Use  . Vaping Use: Never used  Substance Use Topics  . Alcohol use: No  . Drug use: No    Medications: I have reviewed the patient's current medications. Allergies as of 08/28/2020      Reactions   Gadolinium Derivatives Nausea  And Vomiting   Vomited with multihance 12/05/19   Ivp Dye [iodinated Diagnostic Agents] Other (See Comments)   Caused kidney damage   Penicillins Swelling   Tape Dermatitis   Cephalexin Rash   Latex Rash      Medication List       Accurate as of August 28, 2020 11:59 PM. If you have any questions, ask your nurse or doctor.        aspirin 81 MG EC tablet Commonly known as: PX Enteric Aspirin Take 1 tablet (81 mg total) by mouth daily. Swallow whole.   atorvastatin 20 MG tablet Commonly known as: LIPITOR Take 0.5 tablets (10 mg total) by mouth daily.   cloNIDine 0.1 MG tablet Commonly known as: Catapres Take 1 tablet (0.1 mg total) by mouth 2 (two) times daily.   colchicine 0.6 MG tablet Take 0.6 mg by mouth as needed (for gout flares).   darunavir 600 MG tablet Commonly known as: Prezista Take 1 tablet (600 mg total) by mouth 2 (two) times daily.   hydrochlorothiazide 25 MG tablet Commonly known as: HYDRODIURIL Take 25 mg by mouth daily.   ibuprofen 200 MG tablet Commonly known as: ADVIL Take 200 mg by mouth every 6 (six) hours as needed.  ondansetron 4 MG tablet Commonly known as: Zofran Take 1 tablet (4 mg total) by mouth 2 (two) times daily as needed for nausea. Take 30-60 minutes prior to taking ritonavir solution   pantoprazole 40 MG tablet Commonly known as: PROTONIX Take 40 mg by mouth daily.   ritonavir 80 MG/ML solution Commonly known as: NORVIR Take 1.3 mLs (104 mg total) by mouth 2 (two) times daily with a meal.   Tivicay 50 MG tablet Generic drug: dolutegravir Take 1 tablet (50 mg total) by mouth daily.   Vitamin C Chew Chew 1 tablet by mouth daily.   Vitamin D (Ergocalciferol) 1.25 MG (50000 UNIT) Caps capsule Commonly known as: DRISDOL Take 50,000 Units by mouth every 7 (seven) days.        ROS:  A comprehensive review of systems was negative except for: Musculoskeletal: positive for back pain, neck pain and joint  pain Neurological: positive for numbness  Blood pressure (!) 155/85, pulse (!) 59, temperature 98.2 F (36.8 C), temperature source Oral, resp. rate 16, height 5\' 4"  (1.626 m), weight 243 lb (110.2 kg), SpO2 98 %. Physical Exam Vitals reviewed.  HENT:     Head: Normocephalic.     Nose: Nose normal.     Mouth/Throat:     Mouth: Mucous membranes are moist.  Eyes:     Extraocular Movements: Extraocular movements intact.  Cardiovascular:     Rate and Rhythm: Normal rate and regular rhythm.  Pulmonary:     Effort: Pulmonary effort is normal.  Chest:  Breasts:     Right: No axillary adenopathy or supraclavicular adenopathy.     Left: No axillary adenopathy or supraclavicular adenopathy.    Abdominal:     General: There is no distension.     Palpations: Abdomen is soft.     Tenderness: There is no abdominal tenderness.     Comments: Obese, panus  Musculoskeletal:        General: Normal range of motion.     Cervical back: Normal range of motion.  Lymphadenopathy:     Cervical: Cervical adenopathy present.     Upper Body:     Right upper body: No supraclavicular or axillary adenopathy.     Left upper body: No supraclavicular or axillary adenopathy.     Lower Body: Right inguinal adenopathy present. Left inguinal adenopathy present.     Comments: Some cervical adenopathy and sub mandibular but nothing super large, difficult to appreciate any adenopathy in the axilla, bilateral groin with some adenopathy  Neurological:     General: No focal deficit present.     Mental Status: She is alert and oriented to person, place, and time.  Psychiatric:        Mood and Affect: Mood normal.        Behavior: Behavior normal.        Thought Content: Thought content normal.        Judgment: Judgment normal.     Results: Personally reviewed CT neck and PET scans- largest node is about 2cm in the right inguinal region, it is a little lower and other nodes are in this area just superior.    Assessment & Plan:  Laura Jimenez is a 70 y.o. female with adenopathy of unknown origin and concern for lymphoma. She needs a lymph node biopsy. We discussed excisional lymph node biopsy and the risk of bleeding, infection, lymph leak, and not getting a diagnosis. She was hesitant at first but wants to proceed. Discussed preop COVID  testing. Her daughter is a wound care RN and will be able to monitor her inguinal incision which is at higher risk to get infections due to her obesity but still the best place to excise.   All questions were answered to the satisfaction of the patient and family.   Willys Salvino C Kendarious Gudino 08/31/2020, 7:33 AM       

## 2020-09-05 ENCOUNTER — Other Ambulatory Visit: Payer: Self-pay

## 2020-09-05 ENCOUNTER — Ambulatory Visit
Admission: RE | Admit: 2020-09-05 | Discharge: 2020-09-05 | Disposition: A | Discharge status: Home / Self Care | Payer: Medicare Other | Source: Ambulatory Visit | Attending: Sports Medicine | Admitting: Sports Medicine

## 2020-09-05 DIAGNOSIS — M48061 Spinal stenosis, lumbar region without neurogenic claudication: Secondary | ICD-10-CM | POA: Diagnosis not present

## 2020-09-05 DIAGNOSIS — M4310 Spondylolisthesis, site unspecified: Secondary | ICD-10-CM

## 2020-09-05 DIAGNOSIS — M5410 Radiculopathy, site unspecified: Secondary | ICD-10-CM

## 2020-09-07 DIAGNOSIS — I10 Essential (primary) hypertension: Secondary | ICD-10-CM | POA: Diagnosis not present

## 2020-09-07 DIAGNOSIS — E559 Vitamin D deficiency, unspecified: Secondary | ICD-10-CM | POA: Diagnosis not present

## 2020-09-11 ENCOUNTER — Ambulatory Visit: Payer: Medicare Other | Admitting: Internal Medicine

## 2020-09-13 ENCOUNTER — Encounter (HOSPITAL_COMMUNITY): Payer: Self-pay

## 2020-09-13 ENCOUNTER — Other Ambulatory Visit (HOSPITAL_COMMUNITY)
Admission: RE | Admit: 2020-09-13 | Discharge: 2020-09-13 | Disposition: A | Discharge status: Home / Self Care | Payer: Medicare Other | Source: Ambulatory Visit | Attending: General Surgery | Admitting: General Surgery

## 2020-09-13 ENCOUNTER — Other Ambulatory Visit: Payer: Self-pay

## 2020-09-13 ENCOUNTER — Encounter (HOSPITAL_COMMUNITY)
Admission: RE | Admit: 2020-09-13 | Discharge: 2020-09-13 | Disposition: A | Discharge status: Home / Self Care | Payer: Medicare Other | Source: Ambulatory Visit | Attending: General Surgery | Admitting: General Surgery

## 2020-09-13 DIAGNOSIS — Z20822 Contact with and (suspected) exposure to covid-19: Secondary | ICD-10-CM | POA: Insufficient documentation

## 2020-09-13 DIAGNOSIS — Z01812 Encounter for preprocedural laboratory examination: Secondary | ICD-10-CM | POA: Insufficient documentation

## 2020-09-13 DIAGNOSIS — I493 Ventricular premature depolarization: Secondary | ICD-10-CM | POA: Insufficient documentation

## 2020-09-13 DIAGNOSIS — Z01818 Encounter for other preprocedural examination: Secondary | ICD-10-CM | POA: Insufficient documentation

## 2020-09-13 HISTORY — DX: Anxiety disorder, unspecified: F41.9

## 2020-09-13 HISTORY — DX: Gastro-esophageal reflux disease without esophagitis: K21.9

## 2020-09-13 HISTORY — DX: Unspecified osteoarthritis, unspecified site: M19.90

## 2020-09-13 LAB — CBC WITH DIFFERENTIAL/PLATELET
Abs Immature Granulocytes: 0.02 10*3/uL (ref 0.00–0.07)
Basophils Absolute: 0 10*3/uL (ref 0.0–0.1)
Basophils Relative: 0 %
Eosinophils Absolute: 0 10*3/uL (ref 0.0–0.5)
Eosinophils Relative: 1 %
HCT: 38.7 % (ref 36.0–46.0)
Hemoglobin: 12.2 g/dL (ref 12.0–15.0)
Immature Granulocytes: 0 %
Lymphocytes Relative: 61 %
Lymphs Abs: 3.1 10*3/uL (ref 0.7–4.0)
MCH: 29.7 pg (ref 26.0–34.0)
MCHC: 31.5 g/dL (ref 30.0–36.0)
MCV: 94.2 fL (ref 80.0–100.0)
Monocytes Absolute: 0.5 10*3/uL (ref 0.1–1.0)
Monocytes Relative: 9 %
Neutro Abs: 1.5 10*3/uL — ABNORMAL LOW (ref 1.7–7.7)
Neutrophils Relative %: 29 %
Platelets: 132 10*3/uL — ABNORMAL LOW (ref 150–400)
RBC: 4.11 MIL/uL (ref 3.87–5.11)
RDW: 13.8 % (ref 11.5–15.5)
WBC: 5.2 10*3/uL (ref 4.0–10.5)
nRBC: 0 % (ref 0.0–0.2)

## 2020-09-13 LAB — BASIC METABOLIC PANEL
Anion gap: 5 (ref 5–15)
BUN: 14 mg/dL (ref 8–23)
CO2: 25 mmol/L (ref 22–32)
Calcium: 8.7 mg/dL — ABNORMAL LOW (ref 8.9–10.3)
Chloride: 105 mmol/L (ref 98–111)
Creatinine, Ser: 0.83 mg/dL (ref 0.44–1.00)
GFR, Estimated: >60 mL/min (ref >60)
Glucose, Bld: 94 mg/dL (ref 70–99)
Potassium: 3.9 mmol/L (ref 3.5–5.1)
Sodium: 135 mmol/L (ref 135–145)

## 2020-09-13 LAB — SARS CORONAVIRUS 2 (TAT 6-24 HRS): SARS Coronavirus 2: NEGATIVE

## 2020-09-13 NOTE — Patient Instructions (Signed)
Open Lymph Node Biopsy An open lymph node biopsy is a procedure to remove a lymph node so that it can be checked for disease. Lymph nodes are part of the body's disease-fighting system (immune system). The immune system protects the body from infections, germs, and diseases. An open lymph node biopsy may be done to:  Look for germs or cancer cells in your lymph node.  Find out why your lymph node is swollen.  Find out more about a condition you have. Lymph nodes are found in many locations in the body. Biopsies are often done on lymph nodes in the head, neck, armpit, or groin. Tell a health care provider about:  Any allergies you have.  All medicines you are taking, including vitamins, herbs, eye drops, creams, and over-the-counter medicines.  Any problems you or family members have had with anesthetic medicines.  Any blood disorders you have.  Any surgeries you have had.  Any medical conditions you have or have had.  Whether you are pregnant or may be pregnant. What are the risks? Generally, this is a safe procedure. However, problems may occur, including:  Infection.  Bleeding.  Allergic reactions to medicines.  Damage to surrounding structures or organs, such as a nerve.  Scarring. What happens before the procedure? Medicines Ask your health care provider about:  Changing or stopping your regular medicines. This is especially important if you are taking diabetes medicines or blood thinners.  Taking medicines such as aspirin and ibuprofen. These medicines can thin your blood. Do not take these medicines before the procedure unless your health care provider tells you to take them.  Taking over-the-counter medicines, vitamins, herbs, and supplements. Surgery safety Ask your health care provider:  How your surgery site will be marked.  What steps will be taken to help prevent infection. These steps may include: ? Removing hair at the surgery site. ? Washing skin  with a germ-killing soap. ? Receiving antibiotic medicine. General instructions  Follow instructions from your health care provider about eating or drinking restrictions.  You may have an exam or testing.  You may have a blood or urine sample taken.  If you will be going home right after the procedure, plan to have a responsible adult care for you for the time you are told. This is important. What happens during the procedure?  An IV will be inserted into one of your veins.  You will be given one or more of the following: ? A medicine to help you relax (sedative). ? A medicine to numb the area (local anesthetic).  An incision will be made in the area where your lymph node is located.  Your lymph node will be removed.  Your incision will be closed with stitches (sutures).  An antibiotic ointment may be applied to your incision.  A bandage (dressing) will be placed over your incision. The procedure may vary among health care providers and hospitals.   What happens after the procedure?  Your blood pressure, heart rate, breathing rate, and blood oxygen level will be monitored until you leave the hospital or clinic.  Do not drive for 24 hours if you were given a sedative during your procedure.  It is up to you to get the results of your procedure. Ask your health care provider, or the department that is doing the procedure, when your results will be ready. Summary  An open lymph node biopsy is a procedure to remove a lymph node so that it can be checked for   disease.  Generally, this is a safe procedure. However, problems may occur, including bleeding, infection, allergic reaction to medicines, and damage to other structures or organs.  During the procedure, an incision will be made in the area of the lymph node, the lymph node will be removed, and the incision will be closed with sutures.  You will be monitored after the procedure. Do not drive for 24 hours if you were given a  sedative during your procedure. This information is not intended to replace advice given to you by your health care provider. Make sure you discuss any questions you have with your health care provider. Document Revised: 05/24/2020 Document Reviewed: 05/24/2020 Elsevier Patient Education  2021 [place] [employer] [place]  09/13/2020     @[email or web address] @   Your procedure is scheduled on 09/17/20.  Report to [facility] at [phone] A.M.  Call this number if you have problems the morning of surgery:  [phone]   Remember:  Do not eat or drink after midnight.            Take these medicines the morning of surgery with A SIP OF WATER colchicine, darunavir, valium, tivicay, cymbalta, benicar, zofran, protonix, ritonavir    Do not wear jewelry, make-up or nail polish.  Do not wear lotions, powders, or perfumes, or deodorant.  Do not shave 48 hours prior to surgery.  Men may shave face and neck.  Do not bring valuables to the hospital.  [facility] is not responsible for any belongings or valuables.  Contacts, dentures or bridgework may not be worn into surgery.  Leave your suitcase in the car.  After surgery it may be brought to your room.  For patients admitted to the hospital, discharge time will be determined by your treatment team.  Patients discharged the day of surgery will not be allowed to drive home.   Name and phone number of your driver:   Family/friend Special instructions:   Please read over the following fact sheets that you were given. Surgical Site Infection Prevention, Anesthesia Post-op Instructions and Care and Recovery After Surgery      PATIENT INSTRUCTIONS POST-ANESTHESIA  IMMEDIATELY FOLLOWING SURGERY:  Do not drive or operate machinery for the first twenty four hours after surgery.  Do not make any important decisions for twenty four hours after surgery or while taking narcotic pain medications or sedatives.  If you develop intractable nausea and  vomiting or a severe headache please notify your doctor immediately.  FOLLOW-UP:  Please make an appointment with your surgeon as instructed. You do not need to follow up with anesthesia unless specifically instructed to do so.  WOUND CARE INSTRUCTIONS (if applicable):  Keep a dry clean dressing on the anesthesia/puncture wound site if there is drainage.  Once the wound has quit draining you may leave it open to air.  Generally you should leave the bandage intact for twenty four hours unless there is drainage.  If the epidural site drains for more than 36-48 hours please call the anesthesia department.  QUESTIONS?:  Please feel free to call your physician or the hospital operator if you have any questions, and they will be happy to assist you.

## 2020-09-14 ENCOUNTER — Other Ambulatory Visit (HOSPITAL_COMMUNITY): Payer: Medicare Other

## 2020-09-14 ENCOUNTER — Encounter (HOSPITAL_COMMUNITY): Admission: RE | Admit: 2020-09-14 | Payer: Medicare Other | Source: Ambulatory Visit

## 2020-09-17 ENCOUNTER — Other Ambulatory Visit: Payer: Self-pay

## 2020-09-17 ENCOUNTER — Ambulatory Visit (HOSPITAL_COMMUNITY)
Admission: RE | Admit: 2020-09-17 | Discharge: 2020-09-17 | Disposition: A | Discharge status: Home / Self Care | Payer: Medicare Other | Attending: General Surgery | Admitting: General Surgery

## 2020-09-17 ENCOUNTER — Ambulatory Visit (HOSPITAL_COMMUNITY): Payer: Medicare Other | Admitting: Certified Registered"

## 2020-09-17 ENCOUNTER — Encounter (HOSPITAL_COMMUNITY): Payer: Self-pay | Admitting: General Surgery

## 2020-09-17 ENCOUNTER — Encounter (HOSPITAL_COMMUNITY)
Admission: RE | Disposition: A | Discharge status: Home / Self Care | Payer: Self-pay | Source: Home / Self Care | Attending: General Surgery

## 2020-09-17 DIAGNOSIS — Z79899 Other long term (current) drug therapy: Secondary | ICD-10-CM | POA: Insufficient documentation

## 2020-09-17 DIAGNOSIS — R590 Localized enlarged lymph nodes: Secondary | ICD-10-CM | POA: Diagnosis not present

## 2020-09-17 DIAGNOSIS — Z7982 Long term (current) use of aspirin: Secondary | ICD-10-CM | POA: Insufficient documentation

## 2020-09-17 DIAGNOSIS — Z8673 Personal history of transient ischemic attack (TIA), and cerebral infarction without residual deficits: Secondary | ICD-10-CM | POA: Diagnosis not present

## 2020-09-17 DIAGNOSIS — Z87891 Personal history of nicotine dependence: Secondary | ICD-10-CM | POA: Insufficient documentation

## 2020-09-17 DIAGNOSIS — B20 Human immunodeficiency virus [HIV] disease: Secondary | ICD-10-CM | POA: Insufficient documentation

## 2020-09-17 DIAGNOSIS — Z880 Allergy status to penicillin: Secondary | ICD-10-CM | POA: Insufficient documentation

## 2020-09-17 DIAGNOSIS — Z9109 Other allergy status, other than to drugs and biological substances: Secondary | ICD-10-CM | POA: Diagnosis not present

## 2020-09-17 DIAGNOSIS — R591 Generalized enlarged lymph nodes: Secondary | ICD-10-CM | POA: Diagnosis not present

## 2020-09-17 DIAGNOSIS — I10 Essential (primary) hypertension: Secondary | ICD-10-CM | POA: Diagnosis not present

## 2020-09-17 DIAGNOSIS — Z881 Allergy status to other antibiotic agents status: Secondary | ICD-10-CM | POA: Diagnosis not present

## 2020-09-17 DIAGNOSIS — R599 Enlarged lymph nodes, unspecified: Secondary | ICD-10-CM | POA: Diagnosis not present

## 2020-09-17 HISTORY — PX: LYMPH NODE BIOPSY: SHX201

## 2020-09-17 SURGERY — LYMPH NODE BIOPSY
Anesthesia: General | Site: Groin | Laterality: Right

## 2020-09-17 MED ORDER — BUPIVACAINE HCL (PF) 0.5 % IJ SOLN
INTRAMUSCULAR | Status: DC | PRN
  Administered 2020-09-17: 10 mL

## 2020-09-17 MED ORDER — LIDOCAINE 2% (20 MG/ML) 5 ML SYRINGE
INTRAMUSCULAR | Status: DC | PRN
  Administered 2020-09-17: 80 mg via INTRAVENOUS

## 2020-09-17 MED ORDER — DEXAMETHASONE SODIUM PHOSPHATE 10 MG/ML IJ SOLN
INTRAMUSCULAR | Status: DC | PRN
  Administered 2020-09-17: 5 mg via INTRAVENOUS

## 2020-09-17 MED ORDER — CLINDAMYCIN PHOSPHATE 900 MG/50ML IV SOLN
900.0000 mg | INTRAVENOUS | Status: AC
  Administered 2020-09-17: 900 mg via INTRAVENOUS
  Filled 2020-09-17: qty 50

## 2020-09-17 MED ORDER — ORAL CARE MOUTH RINSE: 15.0000 mL | Freq: Once | OROMUCOSAL | Status: AC

## 2020-09-17 MED ORDER — DEXAMETHASONE SODIUM PHOSPHATE 10 MG/ML IJ SOLN
INTRAMUSCULAR | Status: AC
  Filled 2020-09-17: qty 1

## 2020-09-17 MED ORDER — FENTANYL CITRATE (PF) 100 MCG/2ML IJ SOLN
INTRAMUSCULAR | Status: DC | PRN
  Administered 2020-09-17 (×2): 50 ug via INTRAVENOUS

## 2020-09-17 MED ORDER — EPHEDRINE 5 MG/ML INJ
INTRAVENOUS | Status: AC
  Filled 2020-09-17: qty 10

## 2020-09-17 MED ORDER — PROPOFOL 10 MG/ML IV BOLUS
INTRAVENOUS | Status: DC | PRN
  Administered 2020-09-17: 150 mg via INTRAVENOUS

## 2020-09-17 MED ORDER — ONDANSETRON HCL 4 MG/2ML IJ SOLN
INTRAMUSCULAR | Status: AC
  Filled 2020-09-17: qty 2

## 2020-09-17 MED ORDER — OXYCODONE HCL 5 MG PO TABS: 5.0000 mg | ORAL_TABLET | ORAL | 0 refills | Status: DC | PRN

## 2020-09-17 MED ORDER — PROPOFOL 10 MG/ML IV BOLUS
INTRAVENOUS | Status: AC
  Filled 2020-09-17: qty 40

## 2020-09-17 MED ORDER — FENTANYL CITRATE (PF) 100 MCG/2ML IJ SOLN: 25.0000 ug | INTRAMUSCULAR | Status: DC | PRN

## 2020-09-17 MED ORDER — 0.9 % SODIUM CHLORIDE (POUR BTL) OPTIME
TOPICAL | Status: DC | PRN
  Administered 2020-09-17: 1000 mL

## 2020-09-17 MED ORDER — SODIUM CHLORIDE 0.9 % IV SOLN: INTRAVENOUS | Status: DC | PRN

## 2020-09-17 MED ORDER — CHLORHEXIDINE GLUCONATE CLOTH 2 % EX PADS: 6.0000 | MEDICATED_PAD | Freq: Once | CUTANEOUS | Status: DC

## 2020-09-17 MED ORDER — CHLORHEXIDINE GLUCONATE 0.12 % MT SOLN
OROMUCOSAL | Status: AC
  Filled 2020-09-17: qty 15

## 2020-09-17 MED ORDER — LACTATED RINGERS IV SOLN: INTRAVENOUS | Status: DC

## 2020-09-17 MED ORDER — ONDANSETRON HCL 4 MG/2ML IJ SOLN
INTRAMUSCULAR | Status: DC | PRN
  Administered 2020-09-17: 4 mg via INTRAVENOUS

## 2020-09-17 MED ORDER — ONDANSETRON HCL 4 MG/2ML IJ SOLN: 4.0000 mg | Freq: Once | INTRAMUSCULAR | Status: DC | PRN

## 2020-09-17 MED ORDER — BUPIVACAINE HCL (PF) 0.5 % IJ SOLN
INTRAMUSCULAR | Status: AC
  Filled 2020-09-17: qty 30

## 2020-09-17 MED ORDER — PHENYLEPHRINE 40 MCG/ML (10ML) SYRINGE FOR IV PUSH (FOR BLOOD PRESSURE SUPPORT)
PREFILLED_SYRINGE | INTRAVENOUS | Status: AC
  Filled 2020-09-17: qty 10

## 2020-09-17 MED ORDER — EPHEDRINE SULFATE-NACL 50-0.9 MG/10ML-% IV SOSY
PREFILLED_SYRINGE | INTRAVENOUS | Status: DC | PRN
  Administered 2020-09-17: 5 mg via INTRAVENOUS
  Administered 2020-09-17 (×2): 10 mg via INTRAVENOUS

## 2020-09-17 MED ORDER — CHLORHEXIDINE GLUCONATE 0.12 % MT SOLN
15.0000 mL | Freq: Once | OROMUCOSAL | Status: AC
  Administered 2020-09-17: 15 mL via OROMUCOSAL

## 2020-09-17 MED ORDER — PHENYLEPHRINE 40 MCG/ML (10ML) SYRINGE FOR IV PUSH (FOR BLOOD PRESSURE SUPPORT)
PREFILLED_SYRINGE | INTRAVENOUS | Status: DC | PRN
  Administered 2020-09-17: 80 ug via INTRAVENOUS
  Administered 2020-09-17 (×5): 40 ug via INTRAVENOUS
  Administered 2020-09-17: 80 ug via INTRAVENOUS

## 2020-09-17 MED ORDER — FENTANYL CITRATE (PF) 100 MCG/2ML IJ SOLN
INTRAMUSCULAR | Status: AC
  Filled 2020-09-17: qty 2

## 2020-09-17 MED ORDER — MIDAZOLAM HCL 2 MG/2ML IJ SOLN
INTRAMUSCULAR | Status: AC
  Filled 2020-09-17: qty 2

## 2020-09-17 SURGICAL SUPPLY — 43 items
ADH SKN CLS APL DERMABOND .7 (GAUZE/BANDAGES/DRESSINGS) ×1
APPLIER CLIP 11 MED OPEN (CLIP) ×4
APR CLP MED 11 20 MLT OPN (CLIP) ×2
CLIP APPLIE 11 MED OPEN (CLIP) IMPLANT
CLOTH BEACON ORANGE TIMEOUT ST (SAFETY) ×2 IMPLANT
CNTNR URN SCR LID CUP LEK RST (MISCELLANEOUS) IMPLANT
CONT SPEC 4OZ STRL OR WHT (MISCELLANEOUS) ×2
COVER LIGHT HANDLE STERIS (MISCELLANEOUS) ×4 IMPLANT
COVER PROBE U/S 5X48 (MISCELLANEOUS) ×1 IMPLANT
COVER WAND RF STERILE (DRAPES) ×2 IMPLANT
DECANTER SPIKE VIAL GLASS SM (MISCELLANEOUS) ×2 IMPLANT
DERMABOND ADVANCED (GAUZE/BANDAGES/DRESSINGS) ×1
DERMABOND ADVANCED .7 DNX12 (GAUZE/BANDAGES/DRESSINGS) IMPLANT
ELECT NDL TIP 2.8 STRL (NEEDLE) ×1 IMPLANT
ELECT NEEDLE TIP 2.8 STRL (NEEDLE) ×2 IMPLANT
ELECT REM PT RETURN 9FT ADLT (ELECTROSURGICAL) ×2
ELECTRODE REM PT RTRN 9FT ADLT (ELECTROSURGICAL) ×1 IMPLANT
GLOVE BIO SURGEON STRL SZ 6.5 (GLOVE) ×2 IMPLANT
GLOVE BIOGEL PI IND STRL 6.5 (GLOVE) ×1 IMPLANT
GLOVE BIOGEL PI IND STRL 7.0 (GLOVE) ×1 IMPLANT
GLOVE BIOGEL PI INDICATOR 6.5 (GLOVE)
GLOVE BIOGEL PI INDICATOR 7.0 (GLOVE) ×2
GLOVE SURG SS PI 7.0 STRL IVOR (GLOVE) ×1 IMPLANT
GOWN STRL REUS W/TWL LRG LVL3 (GOWN DISPOSABLE) ×4 IMPLANT
KIT CLEAN CATCH URINE (SET/KITS/TRAYS/PACK) ×2 IMPLANT
KIT TURNOVER KIT A (KITS) ×2 IMPLANT
MANIFOLD NEPTUNE II (INSTRUMENTS) ×2 IMPLANT
NDL HYPO 25X1 1.5 SAFETY (NEEDLE) ×1 IMPLANT
NEEDLE HYPO 25X1 1.5 SAFETY (NEEDLE) ×2 IMPLANT
NS IRRIG 1000ML POUR BTL (IV SOLUTION) ×2 IMPLANT
PACK MINOR (CUSTOM PROCEDURE TRAY) ×2 IMPLANT
PAD ARMBOARD 7.5X6 YLW CONV (MISCELLANEOUS) ×2 IMPLANT
PAD TELFA 3X4 1S STER (GAUZE/BANDAGES/DRESSINGS) ×1 IMPLANT
PENCIL SMOKE EVACUATOR (MISCELLANEOUS) ×1 IMPLANT
SET BASIN LINEN APH (SET/KITS/TRAYS/PACK) ×2 IMPLANT
SHEARS HARMONIC 9CM CVD (BLADE) IMPLANT
SPONGE INTESTINAL PEANUT (DISPOSABLE) ×2 IMPLANT
SUT MNCRL AB 4-0 PS2 18 (SUTURE) ×2 IMPLANT
SUT SILK 2 0 (SUTURE) ×2
SUT SILK 2-0 18XBRD TIE 12 (SUTURE) IMPLANT
SUT VIC AB 3-0 SH 27 (SUTURE) ×2
SUT VIC AB 3-0 SH 27X BRD (SUTURE) ×1 IMPLANT
SYR CONTROL 10ML LL (SYRINGE) ×2 IMPLANT

## 2020-09-17 NOTE — Transfer of Care (Signed)
Immediate Anesthesia Transfer of Care Note  Patient: Laura Jimenez  Procedure(s) Performed: LYMPH NODE BIOPSY; INGUINAL (Right Groin)  Patient Location: PACU  Anesthesia Type:General  Level of Consciousness: drowsy  Airway & Oxygen Therapy: Patient Spontanous Breathing and Patient connected to nasal cannula oxygen  Post-op Assessment: Report given to RN and Post -op Vital signs reviewed and stable  Post vital signs: Reviewed and stable  Last Vitals:  Vitals Value Taken Time  BP 119/67 09/17/20 0843  Temp    Pulse 82 09/17/20 0843  Resp 12 09/17/20 0843  SpO2 100 % 09/17/20 0843  Vitals shown include unvalidated device data.  Last Pain:  Vitals:   09/17/20 0648  PainSc: 0-No pain         Complications: No complications documented.

## 2020-09-17 NOTE — Discharge Instructions (Signed)
Discharge Instructions:  Common Complaints: Pain at the incision site is common.   Diet/ Activity: Shower per your regular routine daily.  Do not take hot showers.  Take warm showers that are less than 10 minutes so the glue does not peel. Do not submerge in the tub for the next 3-4 weeks.  Walk everyday for at least 15-20 minutes. Deep cough and move around every 1-2 hours in the first few days after surgery.  Limit excessive movement, lifting > 10 lbs, stretching with the limb if there is an incision on your arm/armpit or leg.   Limit stretching, pulling on your incision if it is located on other parts of your body.  Do not pick at the dermabond glue on your incision sites. This glue film will remain in place for 1-2 weeks and will start to peel off.  Do not place lotions or balms on your incision unless instructed to specifically by Dr. Constance Haw.   Medication: Take tylenol and ibuprofen as needed for pain control, alternating every 4-6 hours.  Example:  Tylenol 1000mg  @ 6am, 12noon, 6pm, 90midnight (Do not exceed 4000mg  of tylenol a day). Ibuprofen 800mg  @ 9am, 3pm, 9pm, 3am (Do not exceed 3600mg  of ibuprofen a day).  Take Roxicodone for breakthrough pain every 4 hours.  Take Colace for constipation related to narcotic pain medication. If you do not have a bowel movement in 2 days, take Miralax over the counter.  Drink plenty of water to also prevent constipation.   Contact Information: If you have questions or concerns, please call our office, [phone], Monday- Thursday 8AM-5PM and Friday 8AM-12Noon.  If it is after hours or on the weekend, please call [facility], [phone], [phone], and ask to speak to the surgeon on call for Dr. Constance Haw at [facility].    Open Lymph Node Biopsy, Care After The following information offers guidance on how to care for yourself after your procedure. Your health care provider may also give you more specific instructions. If you have  problems or questions, contact your health care provider. What can I expect after the procedure? After the procedure, it is common to have:  Bruising.  Soreness.  Mild swelling. Follow these instructions at home: Medicines  Take over-the-counter and prescription medicines only as told by your health care provider.  If you were prescribed an antibiotic medicine, take or use it as told by your health care provider. Do not stop taking the antibiotic even if you start to feel better.  Do not drive or use heavy machinery while taking prescription pain medicine. Incision care  Follow instructions from your health care provider about how to take care of your incision. Make sure you: ? Wash your hands with soap and water for at least 20 seconds before and after you change your bandage (dressing). If soap and water are not available, use hand sanitizer. ? Change your dressing as told by your health care provider. ? Leave stitches (sutures), skin glue, or adhesive strips in place. These skin closures may need to stay in place for 2 weeks or longer. If adhesive strip edges start to loosen and curl up, you may trim the loose edges. Do not remove adhesive strips completely unless your health care provider tells you to do that.  Check your incision area every day for signs of infection. Check for: ? More redness, swelling, or pain. ? Fluid or blood. ? Warmth. ? Pus or a bad smell.   General instructions  Do not  take baths, swim, or use a hot tub until your health care provider approves.   You may shower.   If you were given a sedative during the procedure, it can affect you for several hours. Do not drive or operate machinery until your health care provider says that it is safe.  Return to your normal activities as told by your health care provider. Ask your health care provider what activities are safe for you.  Keep all follow-up visits. This is important. Contact a health care provider  if:  You have a fever.  You have increased redness, swelling, or pain around your incision.  You have fluid or blood coming from your incision.  Your incision feels warm to the touch.  You have pus or a bad smell coming from your incision.  You have pain or numbness that gets worse or lasts longer than a few days. Summary  After a lymph node biopsy, it is common to have bruising, soreness, and mild swelling.  Follow your health care provider's instructions about taking care of yourself at home. You will be told how to take medicines, take care of your incision, and check for infection.  Return to your normal activities as told by your health care provider. Ask your health care provider what activities are safe for you.  Contact a health care provider if you have increased redness, swelling, or pain around your incision, you have a fever, or you have worsening pain or numbness. This information is not intended to replace advice given to you by your health care provider. Make sure you discuss any questions you have with your health care provider. Document Revised: 05/24/2020 Document Reviewed: 05/24/2020 Elsevier Patient Education  2021 [place] Anesthesia, Adult, Care After This sheet gives you information about how to care for yourself after your procedure. Your health care provider may also give you more specific instructions. If you have problems or questions, contact your health care provider. What can I expect after the procedure? After the procedure, the following side effects are common:  Pain or discomfort at the IV site.  Nausea.  Vomiting.  Sore throat.  Trouble concentrating.  Feeling cold or chills.  Feeling weak or tired.  Sleepiness and fatigue.  Soreness and body aches. These side effects can affect parts of the body that were not involved in surgery. Follow these instructions at home: For the time period you were told by your  health care provider:  Rest.  Do not participate in activities where you could fall or become injured.  Do not drive or use machinery.  Do not drink alcohol.  Do not take sleeping pills or medicines that cause drowsiness.  Do not make important decisions or sign legal documents.  Do not take care of children on your own.   Eating and drinking  Follow any instructions from your health care provider about eating or drinking restrictions.  When you feel hungry, start by eating small amounts of foods that are soft and easy to digest (bland), such as toast. Gradually return to your regular diet.  Drink enough fluid to keep your urine pale yellow.  If you vomit, rehydrate by drinking water, juice, or clear broth. General instructions  If you have sleep apnea, surgery and certain medicines can increase your risk for breathing problems. Follow instructions from your health care provider about wearing your sleep device: ? Anytime you are sleeping, including during daytime naps. ? While taking prescription  pain medicines, sleeping medicines, or medicines that make you drowsy.  Have a responsible adult stay with you for the time you are told. It is important to have someone help care for you until you are awake and alert.  Return to your normal activities as told by your health care provider. Ask your health care provider what activities are safe for you.  Take over-the-counter and prescription medicines only as told by your health care provider.  If you smoke, do not smoke without supervision.  Keep all follow-up visits as told by your health care provider. This is important. Contact a health care provider if:  You have nausea or vomiting that does not get better with medicine.  You cannot eat or drink without vomiting.  You have pain that does not get better with medicine.  You are unable to pass urine.  You develop a skin rash.  You have a fever.  You have redness around  your IV site that gets worse. Get help right away if:  You have difficulty breathing.  You have chest pain.  You have blood in your urine or stool, or you vomit blood. Summary  After the procedure, it is common to have a sore throat or nausea. It is also common to feel tired.  Have a responsible adult stay with you for the time you are told. It is important to have someone help care for you until you are awake and alert.  When you feel hungry, start by eating small amounts of foods that are soft and easy to digest (bland), such as toast. Gradually return to your regular diet.  Drink enough fluid to keep your urine pale yellow.  Return to your normal activities as told by your health care provider. Ask your health care provider what activities are safe for you. This information is not intended to replace advice given to you by your health care provider. Make sure you discuss any questions you have with your health care provider. Document Revised: 04/26/2020 Document Reviewed: 11/24/2019 Elsevier Patient Education  [employer].     Oxycodone Capsules or Tablets Sander Nephew es [place] medicamento? La OXICODONA es un analgsico. Se [place] para tratar el dolor moderado a severo. Este medicamento puede ser utilizado para otros usos; si tiene alguna pregunta consulte con su proveedor de atencin mdica o con su farmacutico. MARCAS COMUNES: Dazidox, Endocodone, Oxaydo, Newman, OxyIR, Percolone, Roxicodone, Roxybond [employer] debo informar a mi profesional de la salud antes de tomar este medicamento? Necesitan saber si usted presenta alguno de los siguientes problemas o situaciones: enfermedad de [place] tumor cerebral lesin de la cabeza enfermedad cardiaca antecedentes de abuso de alcohol o drogas si bebe alcohol con frecuencia enfermedad renal enfermedad heptica enfermedad pulmonar o respiratoria, como asma trastorno mental enfermedad pancretica convulsiones enfermedad tiroidea una reaccin  inusual o alrgica a la oxicodona, codena, hidrocodona, morfina, a otros medicamentos, alimentos, colorantes o conservantes si est embarazada o buscando quedar embarazada si est amamantando a un beb Cmo debo utilizar este medicamento? Tome este medicamento por va oral con un vaso de agua. Siga las instrucciones de la etiqueta del [place]. Puede tomarlo con o sin alimentos. Si el Transport planner, tmelo con alimentos. Tome su medicamento a intervalos regulares. No lo tome con una frecuencia mayor a la indicada. No deje de tomarlo, excepto si as lo indica su mdico. Algunas marcas de este medicamento, como [place]ro, tienen instrucciones especiales. Consulte a su mdico o farmacutico si estas instrucciones son  para usted: no corte, triture ni [employer]. Trague solo una tableta a Radiographer, therapeutic. No humedezca, moje, ni chupe la tableta antes de tomarla. Su farmacutico le dar una Gua del medicamento especial (MedGuide, nombre en ingls) con cada receta y en cada ocasin que la vuelva a surtir. Asegrese de leer esta informacin cada vez cuidadosamente. Hable con su pediatra para informarse acerca del uso de este medicamento en nios. Puede requerir atencin especial. Sobredosis: Pngase en contacto inmediatamente con un centro toxicolgico o una sala de urgencia si usted cree que haya tomado demasiado medicamento. ATENCIN: [employer] es solo para usted. No comparta este medicamento con nadie. Qu sucede si me olvido de una dosis? Si olvida una dosis, tmela lo antes posible. Si es casi la hora de la prxima dosis, tome slo esa dosis. No tome dosis adicionales o dobles. Qu puede interactuar con este medicamento? Esta medicina puede interactuar con los siguientes medicamentos: alcohol antihistamnicos para Buyer, retail, tos y resfro medicamentos antivirales para el VIH o SIDA atropina ciertos antibiticos, tales [place], eritromicina, linezolida,  rifampicina ciertos medicamentos para la ansiedad o para conciliar el sueo ciertos medicamentos para problemas de vejiga, tales como oxibutinina, tolterodina ciertos medicamentos para la depresin, como amitriptilina, fluoxetina, sertralina ciertos medicamentos para infecciones micticas, tales como quetoconazol, itraconazol, voriconazol ciertos medicamentos para la migraa, tales como almotriptn, eletriptn, frovatriptn, naratriptn, rizatriptn, sumatriptn, zolmitriptn ciertos medicamentos para las nuseas o los vmitos, tales como dolasetrn, [place], Contractor ciertos medicamentos para el mal de Parkinson, tales como benzatropina, trihexifenidilo ciertos medicamentos para convulsiones, tales como fenobarbital, fenitona, primidona ciertos medicamentos para problemas estomacales, tales como diciclomina, hiosciamina ciertos medicamentos para el mareo por movimiento, tal como escopolamina diurticos anestsicos generales, tales como halotano, isoflurano, metoxiflurano, propofol ipratropio anestsicos locales, tales como lidocana, pramoxina, tetracana IMAO, tales como Carbex, Eldepryl, Marplan, Nardil y Parnate medicamentos para relajar los msculos antes de una ciruga azul de metileno nilotinib otros medicamentos narcticos para Conservation officer, historic buildings o la tos fenotiazinas, tales como Chief of Staff, Musician, Government social research officer, tioridazina Puede ser que esta lista no menciona todas las posibles interacciones. Informe a su profesional de [employer] Home	[place] de [employer] Corporation productos a base de hierbas, medicamentos de [place] o suplementos nutritivos que est tomando. Si usted fuma, consume bebidas alcohlicas o si utiliza drogas ilegales, indqueselo tambin a su profesional de KB Home	[place]. Algunas sustancias pueden interactuar con su medicamento. A qu debo estar atento al usar Coca-Cola? Informe a su proveedor de atencin mdica si el dolor no desaparece, si empeora, o si tiene un dolor nuevo o diferente. Es  posible que desarrolle tolerancia a este frmaco. Tolerancia significa que necesitar una dosis ms alta del frmaco para Best boy. La tolerancia es normal y previsible si [place] este frmaco por The [employer]. Existen distintos tipos de frmacos narcticos (opioides) para Conservation officer, historic buildings. Si [place] ms de un tipo a la vez, es posible que tenga ms efectos secundarios. Entrguele a su proveedor de atencin mdica una lista de todos los frmacos que [place]. l o ella le dir qu cantidad debe usar del frmaco. No use ms frmaco de lo indicado. Busque ayuda de emergencia de inmediato si tiene problemas para respirar. No deje de usar su frmaco repentinamente porque puede desarrollar una reaccin grave. Su cuerpo se acostumbra al frmaco. Esto NO significa que usted es [place]. La adiccin es una conducta relacionada a la obtencin y [place] de una droga por razones que no son mdicas. Si usted tiene dolor, tiene una razn mdica para  usar Primary school teacher. Su proveedor de Optometrist dir cunto Customer service manager. Si su proveedor de atencin mdica desea que deje de Tax inspector, la dosis se ir disminuyendo lentamente durante un tiempo para Midwife secundario. Hable con su proveedor de atencin [employer] Corporation naloxona y cmo [place]. La naloxona es un frmaco de emergencia usado para tratar una sobredosis de opioides. Puede producirse una sobredosis si usted [place] demasiada cantidad de un opioide. Tambin puede suceder si se [place] un opioide con algunos otros frmacos o sustancias, tales como alcohol. Conozca los sntomas de una sobredosis, tales como dificultad para respirar, cansancio o somnolencia inusual, o no poder responder o despertarse. Asegrese de decirles a los cuidadores y a los contactos cercanos dnde est guardada. Asegrese de que sepan cmo usarla. Despus de que se administra la naloxona, debe recibir ayuda de emergencia de inmediato. La naloxona es un tratamiento temporal. Es posible que  se necesiten varias dosis. Puede experimentar somnolencia o mareos. No conduzca, no utilice maquinaria ni haga nada que Associate Professor en estado de alerta hasta que sepa cmo le afecta este frmaco. No se siente ni se ponga de pie con rapidez, especialmente si es un paciente de edad avanzada. Esto reduce el riesgo de mareos o [employer]. El alcohol puede interferir con el efecto de este frmaco. Evite consumir bebidas alcohlicas. Este frmaco causar estreimiento. Si no evacua los intestinos durante 3 das, llame a su proveedor de Geophysical data processor. Se le podra secar la boca. Masticar chicle sin azcar o chupar caramelos duros y beber agua en abundancia le ayudar a mantener la boca hmeda. Si el problema no desaparece o es severo, consulte a su proveedor de Geophysical data processor. El recubrimiento de la tableta de algunas marcas de este frmaco no se disuelve. Esto es normal. Es posible que encuentre el recubrimiento intacto de las tabletas en las heces. Esto no debe ser motivo de preocupacin. Qu efectos secundarios puedo tener al [employer] Corporation este medicamento? Efectos secundarios que debe informar a su mdico o a Barrister's clerk de la salud tan pronto como sea posible: Chief of Staff, como erupcin cutnea, comezn/picazn o urticarias, e hinchazn de la cara, los labios o la lengua problemas respiratorios confusin signos y sntomas de presin sangunea baja, tales como [place], sensacin de [place] o aturdimiento, cadas, cansancio o debilidad inusual dificultad para orinar o cambios en el volumen de orina problemas para tragar Efectos secundarios que generalmente no requieren atencin mdica (infrmelos a su mdico o a su profesional de la salud si persisten o si son molestos): estreimiento boca seca nuseas, vmito cansancio Puede ser que esta lista no menciona todos los posibles efectos secundarios. Comunquese a su mdico por asesoramiento mdico Humana Inc. Usted puede  informar los efectos secundarios a la FDA por telfono al 1-800-FDA-1088. Dnde debo guardar mi medicina? Mantenga fuera del alcance de los nios. Existe la posibilidad de abusar de Coca-Cola. Mantenga su medicamento en un lugar seguro para protegerlo contra robos. No comparta este medicamento con nadie. Es [place], y est prohibido por la ley. Guarde a [employer] Group, entre 15 y 36 grados Celsius (61 y 40 grados Fahrenheit). Proteja de Naval architect. [place]. Este medicamento puede causar daos y la muerte si lo toman otros adultos, nios o Copy. Regrese el medicamento que no haya utilizado a Engineer, civil (consulting) oficial para desecharlo. Contacte a la DEA al [phone] o al gobierno de su ciudad/condado para encontrar un  lugar. Si no puede devolver el medicamento, arrjelo por el sanitario. No utilice este medicamento despus de la fecha de vencimiento. ATENCIN: Este folleto es un resumen. Puede ser que no cubra toda la posible informacin. Si usted tiene preguntas acerca de esta medicina, consulte con su mdico, su farmacutico o su profesional de Technical sales engineer.  2021 Elsevier/Gold Standard (2019-06-15 00:00:00)

## 2020-09-17 NOTE — Op Note (Signed)
[facility] Operative Note  09/17/20  Preoperative Diagnosis:  Adenopathy   Postoperative Diagnosis: Same   Procedure(s) Performed: Excisional lymph node biopsy (right inguinal region/ upper thigh)    Surgeon: Lanell Matar. Constance Haw, MD   Assistants: No qualified resident was available    Anesthesia: General anesthesia    Anesthesiologist: Dr. Briant Cedar    Specimens: Right inguinal lymph node    Estimated Blood Loss: Minimal   Blood Replacement: None    Complications: None   Wound Class: Clean    Operative Indications: Ms. Meas is a 70 yo with HIV who has adenopathy and possible lymphoma and needs an excisional biopsy. She has had a PET scan that demonstrated cervical, axillary nodes and some inguinal nodes with the largest being in the right lower inguinal/ upper thigh region.  We discussed excisional biopsy and the risk of bleeding, infection, lymph leak, and non diagnostic sampling, and she has opted to proceed.   Findings: Right lower inguinal/ upper thigh 3.5cm lymph node found on ultrasound    Procedure: The patient was taken to the operating room and placed supine. General endotracheal anesthesia was induced. Intravenous antibiotics were administered per protocol.  The right groin and upper thigh were prepared and draped in the usual sterile fashion.   Using an ultrasound I was able to identify the lower inguinal upper thigh lymph node that was over 2 cm in size and about 2cm deep in the subcutaneous tissue. This incision was going to be better for the patient given her pannus and risk of infection with a true inguinal incision.  I marked the area over the node, and incised the skin with a scalpel.  I carried this down through the subcutaneous tissue with electrocautery and came to a small vein that was lateral to the node on [name]. This was clipped and divided using medium clips.  I then proceeded to excise the lymph node in its entirety using sharp dissection and  clipping  The tissue around to prevent bleeding or lymphatic leak.  The lymph node was removed and passed off the field to be sent to pathology fresh.  The cavity was irrigated. Hemostasis was confirmed.  The deep space was closed with 3-0 Interrupted Vicryl and the skin was closed with 4-0 Monocryl and dermabond.   All counts were correct at the end of the case. The patient was awakened from anesthesia and extubated without complication.  The patient went to the PACU in stable condition.   Curlene Labrum, MD [facility] [address] [place], [place] [address] [phone] (office)

## 2020-09-17 NOTE — Anesthesia Procedure Notes (Signed)
Procedure Name: LMA Insertion Date/Time: 09/17/2020 7:36 AM Performed by: Gwyndolyn Saxon, CRNA Pre-anesthesia Checklist: Patient identified, Emergency Drugs available, Suction available and Patient being monitored Patient Re-evaluated:Patient Re-evaluated prior to induction Oxygen Delivery Method: Circle system utilized Preoxygenation: Pre-oxygenation with 100% oxygen Induction Type: IV induction Ventilation: Mask ventilation without difficulty LMA: LMA inserted LMA Size: 4.0 Number of attempts: 1 Airway Equipment and Method: Patient positioned with wedge pillow Placement Confirmation: positive ETCO2 and breath sounds checked- equal and bilateral Tube secured with: Tape Dental Injury: Teeth and Oropharynx as per pre-operative assessment  Comments: Pt with poor dentition; all dentition intact after LMA insertion

## 2020-09-17 NOTE — Anesthesia Postprocedure Evaluation (Signed)
Anesthesia Post Note  Patient: Laura Jimenez  Procedure(s) Performed: LYMPH NODE BIOPSY; INGUINAL (Right Groin)  Patient location during evaluation: PACU Anesthesia Type: General Level of consciousness: awake Pain management: pain level controlled Vital Signs Assessment: post-procedure vital signs reviewed and stable Respiratory status: spontaneous breathing Cardiovascular status: blood pressure returned to baseline Postop Assessment: no headache Anesthetic complications: no   No complications documented.   Last Vitals:  Vitals:   09/17/20 0915 09/17/20 0939  BP: 130/72 (!) 143/71  Pulse: 81 75  Resp: 18 18  Temp:  36.5 C  SpO2: 96% 99%    Last Pain:  Vitals:   09/17/20 0939  TempSrc: Oral  PainSc: 0-No pain                 Louann Sjogren

## 2020-09-17 NOTE — Anesthesia Preprocedure Evaluation (Signed)
Anesthesia Evaluation  Patient identified by MRN, date of birth, ID band Patient awake    Reviewed: Allergy & Precautions, H&P , NPO status , Patient's Chart, lab work & pertinent test results, reviewed documented beta blocker date and time   Airway Mallampati: II  TM Distance: >3 FB Neck ROM: full    Dental no notable dental hx.    Pulmonary neg pulmonary ROS, former smoker,    Pulmonary exam normal breath sounds clear to auscultation       Cardiovascular Exercise Tolerance: Good hypertension, negative cardio ROS   Rhythm:regular Rate:Normal     Neuro/Psych PSYCHIATRIC DISORDERS Anxiety  Neuromuscular disease CVA, No Residual Symptoms    GI/Hepatic Neg liver ROS, GERD  ,  Endo/Other  Hypothyroidism Morbid obesity  Renal/GU negative Renal ROS  negative genitourinary   Musculoskeletal   Abdominal   Peds  Hematology  (+) HIV,   Anesthesia Other Findings   Reproductive/Obstetrics negative OB ROS                             Anesthesia Physical Anesthesia Plan  ASA: III  Anesthesia Plan: General   Post-op Pain Management:    Induction:   PONV Risk Score and Plan: Ondansetron  Airway Management Planned:   Additional Equipment:   Intra-op Plan:   Post-operative Plan:   Informed Consent: I have reviewed the patients History and Physical, chart, labs and discussed the procedure including the risks, benefits and alternatives for the proposed anesthesia with the patient or authorized representative who has indicated his/her understanding and acceptance.     Dental Advisory Given  Plan Discussed with: CRNA  Anesthesia Plan Comments:         Anesthesia Quick Evaluation

## 2020-09-17 NOTE — Progress Notes (Signed)
[facility]  Patient's daughter called. Will call her with pathology and will do a post op phone call.  Curlene Labrum, MD [facility] [address] [place], [place] [address] [phone] (office)

## 2020-09-17 NOTE — Interval H&P Note (Signed)
History and Physical Interval Note:  09/17/2020 7:26 AM  Laura Jimenez  has presented today for surgery, with the diagnosis of Lymphadenopathy.  The various methods of treatment have been discussed with the patient and family. After consideration of risks, benefits and other options for treatment, the patient has consented to  Procedure(s): LYMPH NODE BIOPSY; INGUINAL (Right) as a surgical intervention.  The patient's history has been reviewed, patient examined, no change in status, stable for surgery.  I have reviewed the patient's chart and labs.  Questions were answered to the patient's satisfaction.    Marked.  Virl Cagey

## 2020-09-18 ENCOUNTER — Encounter (HOSPITAL_COMMUNITY): Payer: Self-pay | Admitting: General Surgery

## 2020-09-20 ENCOUNTER — Telehealth (INDEPENDENT_AMBULATORY_CARE_PROVIDER_SITE_OTHER): Payer: Medicare Other | Admitting: General Surgery

## 2020-09-20 DIAGNOSIS — R599 Enlarged lymph nodes, unspecified: Secondary | ICD-10-CM

## 2020-09-20 LAB — SURGICAL PATHOLOGY

## 2020-09-20 NOTE — Telephone Encounter (Signed)
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  Notified her daughter, and have forwarded to Dr. Delton Coombes. Shea Stakes is going to let the office know the fax number to her ID physicians at [facility] so they can get the pathology results also. Will have office fax.   Curlene Labrum, MD [facility] [address] [place], [place] [address] [phone] (office)

## 2020-10-02 ENCOUNTER — Telehealth (INDEPENDENT_AMBULATORY_CARE_PROVIDER_SITE_OTHER): Payer: Medicare Other | Admitting: General Surgery

## 2020-10-02 DIAGNOSIS — R599 Enlarged lymph nodes, unspecified: Secondary | ICD-10-CM

## 2020-10-02 NOTE — Progress Notes (Signed)
[facility]  I am calling the patient for post operative evaluation. This is not a billable encounter as it is under the [place] charges for the surgery.  The patient had a lymph node biopsy on 09/17/2020.   Spoke with patient and she says her incision is healing well without issues. Her daughter says it is doing good but an area of hardness/ knot.  No redness.  Some discomfort in the area times.    I told her if she is worried about the hard area/ knot that we will plan to see her next week. It could be a seroma/ hematoma versus just scarring.   Pathology: FINAL MICROSCOPIC DIAGNOSIS:   A. LYMPH NODE, GROIN, RIGHT, BIOPSY:  - Findings consistent with HIV lymphadenopathy.   Curlene Labrum, MD [facility] [address] [place], [place] [address] [phone] (office)

## 2020-10-09 ENCOUNTER — Ambulatory Visit (INDEPENDENT_AMBULATORY_CARE_PROVIDER_SITE_OTHER): Payer: Medicare Other | Admitting: General Surgery

## 2020-10-09 ENCOUNTER — Encounter: Payer: Self-pay | Admitting: General Surgery

## 2020-10-09 ENCOUNTER — Other Ambulatory Visit: Payer: Self-pay

## 2020-10-09 VITALS — BP 183/99 | HR 66 | Temp 97.9°F | Resp 14 | Ht 64.0 in | Wt 237.0 lb

## 2020-10-09 DIAGNOSIS — M7989 Other specified soft tissue disorders: Secondary | ICD-10-CM

## 2020-10-09 DIAGNOSIS — L7634 Postprocedural seroma of skin and subcutaneous tissue following other procedure: Secondary | ICD-10-CM

## 2020-10-09 NOTE — Progress Notes (Signed)
[facility] Note   HPI:  70 y.o. Female presents to clinic for post-op follow-up evaluation of her right leg. Patient reports she has some swelling and a knot, but denies drainage.  Review of Systems:  No fevers Pain with walking Right leg swelling All other review of systems: otherwise negative   Vital Signs:  BP (!) 183/99   Pulse 66   Temp 97.9 F (36.6 C) (Other (Comment))   Resp 14   Ht 5\' 4"  (1.626 m)   Wt 237 lb (107.5 kg)   SpO2 97%   BMI 40.68 kg/m    Physical Exam:  Physical Exam Musculoskeletal:        General: Normal range of motion.     Right lower leg: Edema present.     Comments: 3cm seroma felt, lidocaine injected and aspirated 60cc of serous fluid, bandage placed      Assessment:  70 y.o. yo Female with a post operative seroma after right groin lymph node excision. Her swelling is likely from this but will get a US DVT protocol to ensure no DVT. May have to aspirate the seroma again.   Plan:  Keep dressing on for 48 hours. Will get [name] to ensure no blood clot. Call with any concerns.   Future Appointments  Date Time Provider [place]  10/16/2020 10:45 AM Virl Cagey, MD [facility] None     All of the above recommendations were discussed with the patient and patient's family, and all of patient's and family's questions were answered to their expressed satisfaction.  Curlene Labrum, MD [facility] [address] [place],  [address] [phone] (office)

## 2020-10-09 NOTE — Patient Instructions (Signed)
Keep dressing on for 48 hours. Will get [name] to ensure no blood clot. Call with any concerns.

## 2020-10-16 ENCOUNTER — Ambulatory Visit (INDEPENDENT_AMBULATORY_CARE_PROVIDER_SITE_OTHER): Payer: Medicare Other | Admitting: General Surgery

## 2020-10-16 ENCOUNTER — Ambulatory Visit (HOSPITAL_COMMUNITY)
Admission: RE | Admit: 2020-10-16 | Discharge: 2020-10-16 | Disposition: A | Discharge status: Home / Self Care | Payer: Medicare Other | Source: Ambulatory Visit | Attending: General Surgery | Admitting: General Surgery

## 2020-10-16 ENCOUNTER — Encounter: Payer: Self-pay | Admitting: General Surgery

## 2020-10-16 ENCOUNTER — Other Ambulatory Visit: Payer: Self-pay

## 2020-10-16 VITALS — BP 139/81 | HR 63 | Temp 97.6°F | Resp 14 | Ht 64.0 in | Wt 245.0 lb

## 2020-10-16 DIAGNOSIS — L7634 Postprocedural seroma of skin and subcutaneous tissue following other procedure: Secondary | ICD-10-CM

## 2020-10-16 DIAGNOSIS — M7989 Other specified soft tissue disorders: Secondary | ICD-10-CM | POA: Insufficient documentation

## 2020-10-16 NOTE — Patient Instructions (Signed)
Get [name] to ensure no blood clots. Will monitor seroma.

## 2020-10-16 NOTE — Progress Notes (Signed)
[facility] Note   HPI:  70 y.o. Female presents to clinic for follow-up evaluation of her left thigh seroma after lymph node excision. The area is less painful but did re-accumulate.  Review of Systems:  No redness or drainage All other review of systems: otherwise negative   Vital Signs:  BP 139/81   Pulse 63   Temp 97.6 F (36.4 C) (Other (Comment))   Resp 14   Ht 5\' 4"  (1.626 m)   Wt 245 lb (111.1 kg)   SpO2 97%   BMI 42.05 kg/m    Physical Exam:  Physical Exam Pulmonary:     Effort: Pulmonary effort is normal.  Musculoskeletal:     Comments: Right thigh swelling under incision, consistent with seroma, sterile aspirated 180 cc of serous fluid, no signs of infection    Assessment:  70 y.o. yo Female with right leg swelling after surgery and seroma. We have aspirated it now twice. We may need to do this again. Still with some lower extremity edema R>L. Will get [name] today to ensure no DVT.  Plan:  Get [name] to ensure no blood clots. Will monitor seroma.  Future Appointments  Date Time Provider [place]  10/17/2020 11:00 AM Vu, Rockey Situ, MD [facility]  10/23/2020 10:15 AM Virl Cagey, MD [facility] None     All of the above recommendations were discussed with the patient and patient's family, and all of patient's and family's questions were answered to their expressed satisfaction.  Curlene Labrum, MD [facility] [address] [place],  [address] [phone] (office)

## 2020-10-17 ENCOUNTER — Ambulatory Visit (INDEPENDENT_AMBULATORY_CARE_PROVIDER_SITE_OTHER): Payer: Medicare Other | Admitting: Internal Medicine

## 2020-10-17 ENCOUNTER — Encounter: Payer: Self-pay | Admitting: Internal Medicine

## 2020-10-17 ENCOUNTER — Telehealth (INDEPENDENT_AMBULATORY_CARE_PROVIDER_SITE_OTHER): Payer: Medicare Other | Admitting: General Surgery

## 2020-10-17 VITALS — BP 180/86 | HR 75 | Temp 98.4°F | Wt 246.0 lb

## 2020-10-17 DIAGNOSIS — R591 Generalized enlarged lymph nodes: Secondary | ICD-10-CM

## 2020-10-17 DIAGNOSIS — B20 Human immunodeficiency virus [HIV] disease: Secondary | ICD-10-CM

## 2020-10-17 DIAGNOSIS — L7634 Postprocedural seroma of skin and subcutaneous tissue following other procedure: Secondary | ICD-10-CM

## 2020-10-17 DIAGNOSIS — M7989 Other specified soft tissue disorders: Secondary | ICD-10-CM

## 2020-10-17 NOTE — Progress Notes (Signed)
[place] for Infectious Disease  Reason for Consult:HIV care Referring Provider: [facility]     Patient Active Problem List   Diagnosis Date Noted  . Postoperative seroma of skin after non-dermatologic procedure 10/09/2020  . Right leg swelling 10/09/2020  . Acute pain of left shoulder 10/06/2018  . Hypothyroidism 10/23/2017  . Hyperlipidemia, mixed 08/11/2017  . CVA (cerebral vascular accident) ([place]) 08/11/2017  . Morbid obesity ([place]) 08/11/2017  . Vulvar cancer ([place]) 11/13/2011  . 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 History of CVA (cerebrovascular accident) 11/13/2011  . Post-menopausal bleeding 11/13/2011  . HIV INFECTION 09/08/2006  . CANDIDIASIS, VAGINAL 09/08/2006  . 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 PERIPHERAL NEUROPATHY 09/08/2006  . Essential hypertension 09/08/2006  . REFLUX ESOPHAGITIS 09/08/2006  . OVARIAN CYST 09/08/2006  . DYSPLASIA, CERVIX NOS 09/08/2006  . FOLLICULITIS 72/53/6644  . Enlarged lymph nodes 09/08/2006  . PAP SMEAR, ABNORMAL 10/09/2000    Patient's Medications  New Prescriptions   No medications on file  Previous Medications   ACETAMINOPHEN (TYLENOL) 325 MG TABLET    1 tablet as needed   ASPIRIN (PX ENTERIC ASPIRIN) 81 MG EC TABLET    Take 1 tablet (81 mg total) by mouth daily. Swallow whole.   ATORVASTATIN (LIPITOR) 20 MG TABLET    Take 0.5 tablets (10 mg total) by mouth daily.   COLCHICINE 0.6 MG TABLET    Take 0.6 mg by mouth as needed (for gout flares).    DARUNAVIR (PREZISTA) 600 MG TABLET    Take 1 tablet (600 mg total) by mouth 2 (two) times daily.   DIAZEPAM (VALIUM) 2 MG TABLET    Take 2 mg by mouth daily as needed for anxiety.   DOLUTEGRAVIR (TIVICAY) 50 MG TABLET    Take 1 tablet (50 mg total) by mouth daily.   DULOXETINE (CYMBALTA) 60 MG CAPSULE    Take 60 mg by mouth daily.   IBUPROFEN (ADVIL) 200 MG TABLET    Take 200 mg by mouth every 8 (eight) hours as needed for moderate pain.   OLMESARTAN (BENICAR) 20 MG  TABLET    Take 20 mg by mouth daily.   ONDANSETRON (ZOFRAN) 4 MG TABLET    Take 1 tablet (4 mg total) by mouth 2 (two) times daily as needed for nausea. Take 30-60 minutes prior to taking ritonavir solution   OXYCODONE (ROXICODONE) 5 MG IMMEDIATE RELEASE TABLET    Take 1 tablet (5 mg total) by mouth every 4 (four) hours as needed for severe pain or breakthrough pain.   PANTOPRAZOLE (PROTONIX) 40 MG TABLET    Take 40 mg by mouth daily.   RITONAVIR (NORVIR) 100 MG TABS TABLET    Take 100 mg by mouth 2 (two) times daily with a meal.   RITONAVIR (NORVIR) 80 MG/ML SOLUTION    Take 1.3 mLs (104 mg total) by mouth 2 (two) times daily with a meal.   TETRAHYDROZOLINE 0.05 % OPHTHALMIC SOLUTION    Place 1 drop into both eyes daily as needed (red eyes).   TRIAMTERENE-HYDROCHLOROTHIAZIDE (MAXZIDE-25) 37.5-25 MG TABLET    Take 1 tablet by mouth daily.   VITAMIN C (ASCORBIC ACID) 500 MG TABLET    Take 500 mg by mouth daily.   VITAMIN D, ERGOCALCIFEROL, (DRISDOL) 1.25 MG (50000 UNIT) CAPS CAPSULE    Take 50,000 Units by mouth every 7 (seven) days.  Modified Medications   No medications on file  Discontinued Medications   No medications on file  HPI: Laura Jimenez is a 70 y.o. female gerd/esophageal stricture, hx cva, obese, hiv/aids referred from another system for new patient's hiv care  10/17/20 id f/u Lots of going on. Family members sick with covid/passed away Swallowing still the same. Pending surgery in the eyes delayed by blood pressure issue Not particular straight forward with her compliance, but said she might have missed a few weeks of her ART.  "I know my body. I can't take these big pills. I won't take these medications if it cause me to break out."   She was also seen for generalized lymphadenopathy since early 06/2020 with oncology A pet scan 06/2020 showed hypermetabolic activities in those nodes.  She was initially referred from family medicine at [facility] for this She  underwent right groin lymph node excision biopsy 09/17/20. Postop developed seroma that was evacuated. 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 She has had trouble with esophageal stricture so had to crush her meds. Also complains of lots of nausea with the meds  Other issue is nonpainful skin rash (papule) that had increased in number, especially the last few months. 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 Anxiety   . Arthritis   . Cancer ([place])   . CVA (cerebral infarction)   . GERD (gastroesophageal reflux disease)   . HIV infection ([place])   . HTN (hypertension)   . Hyperlipidemia   . Morbid obesity with BMI of 40.0-44.9, adult ([place])   . Postmenopausal bleeding   . Stroke [facility])    no residual paralysis    Social History   Tobacco Use  . Smoking status: Former Smoker    Packs/day: 1.00    Years: 5.00    Pack years: 5.00    Types: Cigarettes  . Smokeless tobacco: Never Used  Vaping Use  . Vaping Use: Never used   Substance Use Topics  . Alcohol use: No  . Drug use: No    Family History  Problem Relation Age of Onset  . Hypertension Mother   . Hypertension Sister   . Arthritis Sister   . Cancer Other   . Diabetes Other    Allergies  Allergen Reactions  . Gadolinium Derivatives Nausea And Vomiting    Vomited with multihance 12/05/19  . Amlodipine Other (See Comments)  . Ivp Dye [Iodinated Diagnostic Agents] Other (See Comments)    Caused kidney damage  . Penicillins Swelling  . Tape Dermatitis  . Cephalexin Rash  . Latex Rash    OBJECTIVE: Vitals:   10/17/20 1107  BP: (!) 180/86  Pulse: 75  Temp: 98.4 F (36.9 C)  TempSrc: Oral  SpO2: 97%  Weight: 246 lb (111.6 kg)   Body mass index is 42.23 kg/m.   Physical Exam Obese, no distress, pleasant but argumentative about medications HEENT: atraumatic, Per, conj clear, eomi Neck supple CV rrr no mrg Lungs clear; normal respiratory effort abd s/nt; obese Skin several truncal 1-3 cm papules that are nontender, some heterogenous in coloration flesh/pigmented others purpulish appearing Neuro cn 2-12 intact, strength reflex symmetric intact, no tremor Psych alert/oriented Ext no edema  Lab: 10/11 cr 0.8; lft normal; cbc 4.4/12/139 10/11 lipid 198/42/130/146  HIV labs            cd4 (%) / VL 06/2020                           / 653 10/11                 261(13)  / 11k 08/2019             380   /   92   HIV genotype 10/21   Comment: HIV Subtype: B  ___________________________________________________________  Antiretroviral drugs   Resistance Mutations Detected               Predicted  ___________________________________________________________                                      !  !                    NRTIs           !  !  ZDV (zidovudine or Retrovir)  ! NO!    ABC (abacavir or Ziagen)    ! NO!    ddI (didanosine or Videx)      ! NO!    3TC (lamivudine  or Epivir)    ! NO!    FTC (emtricitabine or Emtriva) ! NO!    d4T (stavudine or Zerit)        ! NO!    TDF (tenofovir or Viread)      ! NO!    ________________________________!___!______________________                                     !  !                 NNRTIs          !  !  ETR (etravirine or Intelence)   ! NO!    EFV (efavirenz or Sustiva)    ! NO!    NVP (nevirapine or Viramune)! NO!    RPV (rilpivirine or Edurant)    ! NO!    DOR (doravirine or Pifeltro)   ! NO!    ________________________________!___!______________________                    !  !     PIs            !  !  FPV (fos-amprenavir or Lexiva) ! NO!    IDV (indinavir or Crixivan)    ! NO!    NFV (nelfinavir or Viracept)  ! NO!    SQV (saquinavir or Invirase)  ! NO!  LPV (lopinavir or Kaletra)      ! NO!    ATV (atazanavir or Reyataz)  ! NO!    TPV (tipranavir or Aptivus)     ! NO!    DRV (darunavir or Prezista)   ! NO!      Micro:  Serology: 10/11 urine gc/chlam pcr negative 10/11 rpr negative 10/11 hep b sab, sag, cab nonreactive 10/11 hep a ab positive 10/11 quantiferon gold negative   Imaging: 06/2020 pet scan 1. Scattered hypermetabolic adenopathy throughout the neck, axillary and subpectoral stations in the chest, and in the iliac and inguinal chains in the pelvis. Most of this adenopathy is [place] 4. 2. No splenomegaly, but the spleen has moderately higher activity in general than the liver. 3. Other imaging findings of potential clinical significance: Aortic Atherosclerosis (ICD10-I70.0). Small to moderate-sized hiatal hernia. Mild cardiomegaly. Suspected pars defects at L5.  Assessment/plan: #hiv.  Per patient she was told she had resistance before and thus the preszista/ritonavir/tivicay combination. She is not sure what all meds she took (she did mention  these three consistently). Her med list also show doravirine She has been off meds since 12/2019 due mainly to nausea She has chronic esophageal stricture and having to crush meds but that is not a problem She has a genotype but 5 months off meds and likely only show wild type virus which there is no resistance shown  Discussed with her need for lifelong therapy ideally without break in between I will review her old notes more and see if we can piece together a more tolerable regimen if possible  ------------- Addendum; reviewed outside ID notes 04/2018 id visit Archived resistance from 10/2017 nrti (m41L, m184v, l210w, t215y) NNRTI (K103N, y181C, G190A) PI (M46I, I54V, I84B, L90M) PI accessories G73S; other L10I, L63P, A71V, V77I Therapy at that time: Darunavir/rit bid + tivicay; attempt to reinitiate doravirine 10/2017 darunavir/rit bid + descovy Previous to this visit on biktarvy, doravirine but had nausea  <--- boosted prezista, ftc, ralgegravir Hx breaks in therapy due to side effect prior to discussing with providers Added descovy to bid boosted darunavir bid and once a day tivicay although patient hesitent   08/2006 hiv coreceptor dual tropism 08/2006 genotype m41L, m184, L210, t215y nrti -- all nrti resistance (intermediate tdf) K103n, y181c, g190a nnrti doravirine/etravirine intermediate resistance; rilpiv/nnp/efv high resistance PI (m46i, I54V, I84v, l55m; accessory g73; other l10i, L63P, a71V, v77I) -- atv high level resistance, darunavir low level resistance, fosamprenavir hlr, lopinarvir hlr                          cd4 / VL 04/2018             420 / 31 08/2017             330 / 224k 04/2017             330 / 37 03/2017                    / 122k 02/2015             420 /  24   Vaccine 09/2019 & 10/2019 covid ffizer prevnar 02/2014 Pneumovax 05/2013 tdap 09/2017  -- advise patient need to consistently take ART. She refused to take prezista/tivicay. She thinks the tivicay caused  the current rash mentioned below.  -- she is rather reluctant in learning/believing about our instruction -- will get her to the dermatologist. But advised  her if she doesn't take tivicay then don't take prezista as well -- will get her to see dr [name] next visit  #lymphadenopathy No fever/chill to suggest infectoius procecess. Recent 1/24 biopsy no malignancy. Adenopathy attribuated to hiv.   -- will  monitor  #rash Not sure what this is -- ddx is kaposi but it had stayed the same (black papules on lower abdomen vs scarred lesions  -refer to dermatology again  [facility] Will review vaccines and other issue on next visit   Patient Active Problem List   Diagnosis Date Noted  . Postoperative seroma of skin after non-dermatologic procedure 10/09/2020  . Right leg swelling 10/09/2020  . Acute pain of left shoulder 10/06/2018  . Hypothyroidism 10/23/2017  . Hyperlipidemia, mixed 08/11/2017  . CVA (cerebral vascular accident) ([place]) 08/11/2017  . Morbid obesity ([place]) 08/11/2017  . Vulvar cancer ([place]) 11/13/2011  . 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 History of CVA (cerebrovascular accident) 11/13/2011  . Post-menopausal bleeding 11/13/2011  . HIV INFECTION 09/08/2006  . CANDIDIASIS, VAGINAL 09/08/2006  . 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 PERIPHERAL NEUROPATHY 09/08/2006  . Essential hypertension 09/08/2006  . REFLUX ESOPHAGITIS 09/08/2006  . OVARIAN CYST 09/08/2006  . DYSPLASIA, CERVIX NOS 09/08/2006  . FOLLICULITIS 45/62/5638  . Enlarged lymph nodes 09/08/2006  . PAP SMEAR, ABNORMAL 10/09/2000     Problem List Items Addressed This Visit   None   Visit Diagnoses    Lymphadenopathy    -  Primary   Relevant Orders   HIV 1 RNA quant-no reflex-bld   T-helper cell (CD4)- ([facility] clinic only)   CBC   Comprehensive metabolic panel   Ambulatory referral to Dermatology   HIV disease ([place])       Relevant Orders   HIV 1 RNA quant-no reflex-bld   T-helper cell (CD4)- ([facility] clinic only)    CBC   Comprehensive metabolic panel   Ambulatory referral to Dermatology       I am having Laura Jimenez maintain her aspirin, atorvastatin, pantoprazole, colchicine, Tivicay, darunavir, ritonavir, ondansetron, Vitamin D (Ergocalciferol), ibuprofen, ritonavir, olmesartan, triamterene-hydrochlorothiazide, DULoxetine, diazepam, vitamin C, tetrahydrozoline, oxyCODONE, and acetaminophen.   No orders of the defined types were placed in this encounter.    Follow-up: No follow-ups on file.   I spent more than 30 minute reviewing data/chart, and coordinating care and >50% direct face to face time providing counseling/discussing diagnostics/treatment plan with patient   Jabier Mutton, [place] for Infectious [place] -- -- pager   [phone] cell 10/17/2020, 11:26 AM

## 2020-10-17 NOTE — Telephone Encounter (Signed)
Notified daughter of the results.   CLINICAL DATA:  Right lower extremity swelling after excision of right inguinal lymph node with known seroma. Evaluate for DVT.  EXAM: BILATERAL LOWER EXTREMITY VENOUS DOPPLER ULTRASOUND  TECHNIQUE: Gray-scale sonography with graded compression, as well as color Doppler and duplex ultrasound were performed to evaluate the lower extremity deep venous systems from the level of the common femoral vein and including the common femoral, femoral, profunda femoral, popliteal and calf veins including the posterior tibial, peroneal and gastrocnemius veins when visible. The superficial great saphenous vein was also interrogated. Spectral Doppler was utilized to evaluate flow at rest and with distal augmentation maneuvers in the common femoral, femoral and popliteal veins.  COMPARISON:  None.  FINDINGS: RIGHT LOWER EXTREMITY  Common Femoral Vein: No evidence of thrombus. Normal compressibility, respiratory phasicity and response to augmentation.  Saphenofemoral Junction: No evidence of thrombus. Normal compressibility and flow on color Doppler imaging.  Profunda Femoral Vein: No evidence of thrombus. Normal compressibility and flow on color Doppler imaging.  Femoral Vein: No evidence of thrombus. Normal compressibility, respiratory phasicity and response to augmentation.  Popliteal Vein: No evidence of thrombus. Normal compressibility, respiratory phasicity and response to augmentation.  Calf Veins: No evidence of thrombus. Normal compressibility and flow on color Doppler imaging.  Superficial Great Saphenous Vein: No evidence of thrombus. Normal compressibility.  Venous Reflux:  None.  Other Findings:  None.  LEFT LOWER EXTREMITY  Common Femoral Vein: No evidence of thrombus. Normal compressibility, respiratory phasicity and response to augmentation.  Saphenofemoral Junction: No evidence of thrombus. Normal compressibility and  flow on color Doppler imaging.  Profunda Femoral Vein: No evidence of thrombus. Normal compressibility and flow on color Doppler imaging.  Femoral Vein: No evidence of thrombus. Normal compressibility, respiratory phasicity and response to augmentation.  Popliteal Vein: No evidence of thrombus. Normal compressibility, respiratory phasicity and response to augmentation.  Calf Veins: No evidence of thrombus. Normal compressibility and flow on color Doppler imaging.  Superficial Great Saphenous Vein: No evidence of thrombus. Normal compressibility.  Venous Reflux:  None.  Other Findings:  None.  IMPRESSION: No evidence of DVT within either lower extremity.   Electronically Signed   By: Sandi Mariscal M.D.   On: 10/16/2020 15:07

## 2020-10-17 NOTE — Patient Instructions (Signed)
Will check you labs today  If you can't take all your HIV meds, please hold them all  Will have you see dr [name], our most uptodate and well knowledgeable HIV doc in the group, in 1 month

## 2020-10-18 LAB — T-HELPER CELL (CD4) - (RCID CLINIC ONLY)
CD4 % Helper T Cell: 9 % — ABNORMAL LOW (ref 33–65)
CD4 T Cell Abs: 230 /uL — ABNORMAL LOW (ref 400–1790)

## 2020-10-19 LAB — COMPREHENSIVE METABOLIC PANEL
AG Ratio: 0.7 (calc) — ABNORMAL LOW (ref 1.0–2.5)
ALT: 13 U/L (ref 6–29)
AST: 21 U/L (ref 10–35)
Albumin: 3.8 g/dL (ref 3.6–5.1)
Alkaline phosphatase (APISO): 70 U/L (ref 37–153)
BUN: 14 mg/dL (ref 7–25)
CO2: 28 mmol/L (ref 20–32)
Calcium: 9.6 mg/dL (ref 8.6–10.4)
Chloride: 104 mmol/L (ref 98–110)
Creat: 0.95 mg/dL (ref 0.50–0.99)
Globulin: 5.2 g/dL (calc) — ABNORMAL HIGH (ref 1.9–3.7)
Glucose, Bld: 90 mg/dL (ref 65–99)
Potassium: 3.9 mmol/L (ref 3.5–5.3)
Sodium: 137 mmol/L (ref 135–146)
Total Bilirubin: 0.5 mg/dL (ref 0.2–1.2)
Total Protein: 9 g/dL — ABNORMAL HIGH (ref 6.1–8.1)

## 2020-10-19 LAB — CBC
HCT: 38.4 % (ref 35.0–45.0)
Hemoglobin: 12.3 g/dL (ref 11.7–15.5)
MCH: 30.2 pg (ref 27.0–33.0)
MCHC: 32 g/dL (ref 32.0–36.0)
MCV: 94.3 fL (ref 80.0–100.0)
MPV: 10.6 fL (ref 7.5–12.5)
Platelets: 94 10*3/uL — ABNORMAL LOW (ref 140–400)
RBC: 4.07 10*6/uL (ref 3.80–5.10)
RDW: 13.6 % (ref 11.0–15.0)
WBC: 4.4 10*3/uL (ref 3.8–10.8)

## 2020-10-19 LAB — HIV-1 RNA QUANT-NO REFLEX-BLD
HIV 1 RNA Quant: 33000 Copies/mL — ABNORMAL HIGH
HIV-1 RNA Quant, Log: 4.52 Log cps/mL — ABNORMAL HIGH

## 2020-10-23 ENCOUNTER — Other Ambulatory Visit: Payer: Self-pay

## 2020-10-23 ENCOUNTER — Ambulatory Visit (INDEPENDENT_AMBULATORY_CARE_PROVIDER_SITE_OTHER): Payer: Medicare Other | Admitting: General Surgery

## 2020-10-23 ENCOUNTER — Encounter: Payer: Self-pay | Admitting: General Surgery

## 2020-10-23 VITALS — BP 154/84 | HR 76 | Temp 97.9°F | Resp 14 | Ht 64.0 in | Wt 247.0 lb

## 2020-10-23 DIAGNOSIS — L7634 Postprocedural seroma of skin and subcutaneous tissue following other procedure: Secondary | ICD-10-CM

## 2020-10-23 NOTE — Patient Instructions (Signed)
Keep area wrapped for 48 hours to compress the seroma.

## 2020-10-24 NOTE — Progress Notes (Signed)
[facility] Note   HPI:  70 y.o. Female presents to clinic for follow-up evaluation of the right thigh wound seroma. [name] was negative for DVT. She is overall doing fair with minor complaints of pain in the inferior portion and inguinal region.  Review of Systems:  No erythema or drainage  Swelling in the incision area  All other review of systems: otherwise negative   Vital Signs:  BP (!) 154/84   Pulse 76   Temp 97.9 F (36.6 C) (Other (Comment))   Resp 14   Ht 5\' 4"  (1.626 m)   Wt 247 lb (112 kg)   SpO2 98%   BMI 42.40 kg/m    Physical Exam:  Physical Exam Vitals reviewed.  Cardiovascular:     Rate and Rhythm: Normal rate.  Pulmonary:     Effort: Pulmonary effort is normal.  Musculoskeletal:     Comments: Right thigh incision with seroma, 120cc aspirated serous straw fluid, slightly blood on second 60cc removal   Neurological:     Mental Status: She is alert.     Assessment:  70 y.o. yo Female with seroma that is hopefully collapsing. ACE wrap was placed this time as DVT was negative.   Plan:  - Discussed that we should not keep draining the area due to risk of infection esp if there is no symptoms  - Will see back next week   Future Appointments  Date Time Provider [place]  10/30/2020 10:15 AM Virl Cagey, MD [facility] None  11/21/2020  4:15 PM [name], Lavell Islam, MD [facility]     All of the above recommendations were discussed with the patient and patient's family, and all of patient's and family's questions were answered to their expressed satisfaction.  Curlene Labrum, MD [facility] [address] [place], [place] [address] [phone] (office)

## 2020-10-30 ENCOUNTER — Ambulatory Visit (INDEPENDENT_AMBULATORY_CARE_PROVIDER_SITE_OTHER): Payer: Medicare Other | Admitting: General Surgery

## 2020-10-30 ENCOUNTER — Other Ambulatory Visit: Payer: Self-pay

## 2020-10-30 ENCOUNTER — Encounter: Payer: Self-pay | Admitting: General Surgery

## 2020-10-30 VITALS — BP 167/83 | HR 63 | Temp 97.4°F | Resp 16 | Ht 64.0 in | Wt 248.0 lb

## 2020-10-30 DIAGNOSIS — L7634 Postprocedural seroma of skin and subcutaneous tissue following other procedure: Secondary | ICD-10-CM

## 2020-10-30 NOTE — Progress Notes (Signed)
[facility] Note   HPI:  70 y.o. Female presents to clinic for post-op follow-up evaluation with her right thigh seroma after lymph node excision. She is doing well. She has some minor discomfort but things are better. It feels less swollen.   Review of Systems:  No redness Some swelling All other review of systems: otherwise negative   Vital Signs:  BP (!) 167/83   Pulse 63   Temp (!) 97.4 F (36.3 C) (Other (Comment))   Resp 16   Ht 5\' 4"  (1.626 m)   Wt 248 lb (112.5 kg)   SpO2 98%   BMI 42.57 kg/m    Physical Exam:  Physical Exam Vitals reviewed.  Cardiovascular:     Rate and Rhythm: Normal rate.  Abdominal:     General: There is no distension.     Palpations: Abdomen is soft.     Tenderness: There is no abdominal tenderness.  Musculoskeletal:     Comments: Right thigh incision healed, no erythema, some swelling from seroma, minor tender   Neurological:     Mental Status: She is alert.     Assessment:  70 y.o. yo Female with post op seroma. Overall improving. Not symptomatic. We have aspirated it three times.  Plan:  - Discussed holding on more aspiration so we do not infect the area, patient is in agreement   - If issues with seroma, please call. This will reabsorb over the next few months.    Curlene Labrum, MD [facility] [address] [place], [place] [address] [phone] (office)

## 2020-10-30 NOTE — Patient Instructions (Signed)
If issues with seroma, please call. This will reabsorb over the next few months.

## 2020-11-01 ENCOUNTER — Telehealth: Payer: Self-pay

## 2020-11-01 MED ORDER — DARUNAVIR ETHANOLATE 600 MG PO TABS: 600.0000 mg | ORAL_TABLET | Freq: Two times a day (BID) | ORAL | 11 refills | Status: DC

## 2020-11-01 MED ORDER — RITONAVIR 100 MG PO TABS: 100.0000 mg | ORAL_TABLET | Freq: Every day | ORAL | 11 refills | Status: DC

## 2020-11-01 MED ORDER — DOLUTEGRAVIR SODIUM 50 MG PO TABS: 50.0000 mg | ORAL_TABLET | Freq: Every day | ORAL | 11 refills | Status: DC

## 2020-11-01 NOTE — Telephone Encounter (Signed)
I spoke with patient and relayed her her lab results. Patient states she only has Prezista and that is all she has been taking. Patient would like new prescriptions for Tivicay and Ritonavir to start those as well.  Please send prescriptions to [facility].

## 2020-11-01 NOTE — Telephone Encounter (Signed)
-----   Message from Jabier Mutton, MD sent at 10/31/2020  6:33 PM EST ----- Please let patient know that her hiv is uncontrolled, and her immune system (cd4 count) is getting worse   If she can't take all her hiv meds (prezista/ritonavir) and tivicay, she shouldn't take any at all  She should f/u with dr [name] for 2nd opinion  thanks

## 2020-11-01 NOTE — Addendum Note (Signed)
Addended byPrudencio Pair T on: 11/01/2020 08:54 AM   Modules accepted: Orders

## 2020-11-09 DIAGNOSIS — L989 Disorder of the skin and subcutaneous tissue, unspecified: Secondary | ICD-10-CM | POA: Diagnosis not present

## 2020-11-21 ENCOUNTER — Other Ambulatory Visit: Payer: Self-pay | Admitting: Infectious Disease

## 2020-11-21 ENCOUNTER — Other Ambulatory Visit: Payer: Self-pay

## 2020-11-21 ENCOUNTER — Encounter: Payer: Self-pay | Admitting: Infectious Disease

## 2020-11-21 ENCOUNTER — Ambulatory Visit (INDEPENDENT_AMBULATORY_CARE_PROVIDER_SITE_OTHER): Payer: Medicare Other | Admitting: Infectious Disease

## 2020-11-21 VITALS — BP 145/78 | HR 66 | Temp 97.6°F

## 2020-11-21 DIAGNOSIS — Z889 Allergy status to unspecified drugs, medicaments and biological substances status: Secondary | ICD-10-CM

## 2020-11-21 DIAGNOSIS — Z23 Encounter for immunization: Secondary | ICD-10-CM | POA: Diagnosis not present

## 2020-11-21 DIAGNOSIS — Z91199 Patient's noncompliance with other medical treatment and regimen due to unspecified reason: Secondary | ICD-10-CM

## 2020-11-21 DIAGNOSIS — R21 Rash and other nonspecific skin eruption: Secondary | ICD-10-CM

## 2020-11-21 DIAGNOSIS — B20 Human immunodeficiency virus [HIV] disease: Secondary | ICD-10-CM

## 2020-11-21 DIAGNOSIS — E782 Mixed hyperlipidemia: Secondary | ICD-10-CM | POA: Diagnosis not present

## 2020-11-21 DIAGNOSIS — E559 Vitamin D deficiency, unspecified: Secondary | ICD-10-CM | POA: Diagnosis not present

## 2020-11-21 DIAGNOSIS — K219 Gastro-esophageal reflux disease without esophagitis: Secondary | ICD-10-CM | POA: Diagnosis not present

## 2020-11-21 DIAGNOSIS — E039 Hypothyroidism, unspecified: Secondary | ICD-10-CM | POA: Diagnosis not present

## 2020-11-21 DIAGNOSIS — K222 Esophageal obstruction: Secondary | ICD-10-CM | POA: Diagnosis not present

## 2020-11-21 DIAGNOSIS — Z9119 Patient's noncompliance with other medical treatment and regimen: Secondary | ICD-10-CM

## 2020-11-21 DIAGNOSIS — I10 Essential (primary) hypertension: Secondary | ICD-10-CM | POA: Diagnosis not present

## 2020-11-21 MED ORDER — EMTRICITABINE-TENOFOVIR AF 200-25 MG PO TABS: 1.0000 | ORAL_TABLET | Freq: Every day | ORAL | 4 refills | Status: DC

## 2020-11-21 MED FILL — DESCOVY 200-25 MG TABS: 200-25 | 30 days supply | Qty: 30 | Fill #0

## 2020-11-21 NOTE — Progress Notes (Signed)
Subjective:    Chief complaint follow-up for HIV concerns about rash   Patient ID: Laura Jimenez, female    DOB: 12/03/1950, 70 y.o.   MRN: [id number]  HPI  Laura Jimenez is a 70 year old African-American lady diagnosed with HIV in the 1990s who has a history of multidrug-resistant HIV and intermittent adherence to medications.  While she was at [facility] she was on a regimen of twice daily boosted darunavir (prezista) with norvir along with emtricitabine and isentress BID and with VL <20 fairly consistently in 2016 period, then changed from [place] to [facility] for simplification of her regimen with reduction of pill burden and also giving her an integrase strand transfer inhibitor with higher barrier to resistance.  The patient has told us that she done developed a rash while on TIVICAY.  Certainly I cannot find documentation of her having developed a rash when she was switched to [facility] and she remained undetectable through several visits on this regimen of TIVICAY once daily Emtricitabine  and boosted darunavir ritonavir twice daily.  She is subsequently transferred [facility] and she is largely been viremic although she did get a viral load down to 653 and November 29 of 2021.  There was a very nice note written at [facility] which Dr. Gale Journey had put into the chart which summarize all of the resistance mutations over time that the patient had documented since 2007.  I have put those mutations back into [employer] and the results are below.      Drug resistance interpretation: PR  HIVDB 9.0 (2019-10-17) PI Major Resistance Mutations:  M46I, I54V, I84V, L90IMP  PI Accessory Resistance Mutations : G73S Other Mutations: L10I, L63P, A71V, V77I  Protease Inhibitors atazanavir/r (ATV/r) High-Level Resistance darunavir/r (DRV/r) Low-Level Resistance lopinavir/r (LPV/r) High-Level Resistance   PR  comments PI Major M46I/L are relatively non-polymorphic PI-selected mutations. In combination with other PI-resistance mutations, they are associated with reduced susceptibility to each of the PIs except DRV. I54V is a non-polymorphic PI-selected mutation that contributes reduced susceptibility to each of the PIs except DRV. I84V is a non-polymorphic mutation selected by each of the PIs. It causes high-level resistance to ATV, FPV, IDV, NFV and SQV, intermediate resistance to LPV and TPV, and low-level resistance to DRV. L90M is a non-polymorphic PI-selected mutation that reduces susceptibility to each of the PIs except TPV and DRV. PI Accessory G73S/T/C/A are non-polymorphic accessory PI-selected mutations. They are associated primarily with reduced susceptibility to ATV, SQV, FPV, IDV, and NFV. G73V/D are rare non-polymorphic PI-selected mutations. Other L10I/V are polymorphic, PI-selected accessory mutations that increase the replication of viruses with other PI-resistance mutations. A71V/T are polymorphic, PI-selected accessory mutations that increase the replication of viruses with other PI-resistance mutations. L90M is a non-polymorphic PI-selected mutation which reduces susceptibility to each of the PIs except TPV and DRV. L90IP is a highly unusual mutation at this position. Dosage Considerations There is evidence for low-level DRV resistance. If DRV is administered it should be used twice daily.  Mutation scoring: PR HIVDB 9.0 (2019-10-17) PI ATV/r DRV/r LPV/r M46I 10 0 10 M46I + I84V + L90IMP 5 0 5 M46I + L90IMP 10 0 0 I54V 15 0 15 I54V + L90IMP 10 0 5 G73S 10 0 5 G73S + L90IMP 10 0 0 I84V 60 15 30 L90IMP 25 0 15 Total 155 15 85  Drug resistance interpretation: RT HIVDB 9.0 (2019-10-17)  NRTI Resistance Mutations:  M41L,  M184V, L210W, T215Y NNRTI Resistance Mutations: K103N, Y181C, G190A  Other Mutations: None   Nucleoside Reverse Transcriptase Inhibitors abacavir  (ABC) High-Level Resistance zidovudine (AZT) High-Level Resistance emtricitabine (FTC) High-Level Resistance lamivudine (3TC) High-Level Resistance tenofovir (TDF) Intermediate Resistance Non-nucleoside Reverse Transcriptase Inhibitors doravirine (DOR) Intermediate Resistance efavirenz (EFV) High-Level Resistance etravirine (ETR) Intermediate Resistance nevirapine (NVP) High-Level Resistance rilpivirine (RPV) High-Level Resistance   RT comments NRTI M41L is a TAM that usually occurs with T215Y. In combination, M41L plus T215Y confer intermediate / high-level resistance to AZT and d4T and contribute to reduced ddI, ABC and TDF susceptibility. M184V/I cause high-level in vitro resistance to 3TC and FTC and low-level resistance to ddI and ABC. However, M184V/I are not contraindications to continued treatment with 3TC or FTC because they increase susceptibility to AZT, TDF and d4T and are associated with clinically significant reductions in HIV-1 replication. L210W is a TAM that usually occurs in combination with M41L and T215Y. The combination of M41, L210W and T215Y causes high-level resistance to AZT and d4T and intermediate to high-level resistance to ddI, ABC and TDF. T215Y is a TAM that causes intermediate/high-level resistance to AZT and d4T, low-level resistance to ddI, and potentially low-level resistance to ABC and TDF. NNRTI K103N is a non-polymorphic mutation that causes high-level reductions in NVP and EFV susceptibility. Y181C is a non-polymorphic mutation selected in patients receiving NVP, ETR and RPV. It reduces susceptibility to NVP, ETR, RPV, and EFV by >50-fold, 5-fold, 3-fold, and 2-fold, respectively. Although Y181C itself reduces EFV susceptibility by only 2-fold, it has been associated with a reduced response to an EFV-containing regimen in NNRTI-experienced patients. Y181C has a weight of 2.5 in the [employer] ETR genotypic susceptibility score. Alone, it does not appear to  reduce DOR susceptibility. G190A is a non-polymorphic mutation that causes high-level resistance to NVP and intermediate resistance to EFV. It has a weight of 1.0 in the [employer] ETR genotypic susceptibility score but does not appear to be selected by ETR or RPV or to reduce their in vitro susceptibility in the absence of other NNRTI-resistance mutations. It also does not appear to reduce DOR susceptibility. Mutation scoring: RT HIVDB 9.0 (2019-10-17) NRTI ABC AZT FTC 3TC TDF M41L 5 15 0 0 5 M41L + L210W 10 10 0 0 10 M41L + L210W + T215Y 5 0 5 5 5  M41L + T215Y 15 10 5 5 10  M184V 15 -10 60 60 -10 L210W 5 15 0 0 5 L210W + T215Y 10 10 5 5 10  T215Y 10 40 0 0 10 Total 75 90 75 75 45 NNRTI DOR EFV ETR NVP RPV K103N + Y181C 10 0 0 0 0 Y181C 10 30 30  60 45 Y181C + G190A 20 0 10 0 10 K103N 0 60 0 60 0 G190A 0 45 10 60 15 Total 40 135 50 180 70   She appears to frequently only take the boosted protease inhibitor not take the [facility] because she is concerned that is causing the rash that she has.  Dr. Gearldine Shown has been skeptical of this and I am as well.  She is scheduled for a biopsy with dermatology in April.  She was scheduled with me to consider different regimens and options for her to try to improve her adherence.  She was accompanied by her daughter to the clinic today.  They again detailed the issues she has with esophageal stricture.  She does crush both the Prezista and the Norvir but is able to take the [facility] without difficulty.  I checked with Patrick Jupiter D and he had informed me that Norvir should not be crushed due to it solubility issues.  We will endeavor to get NORVIR powder replaced the question of your though I cannot locate it in the epic's database.  I also think she would benefit from having an RTI therapy back on board and would add DESCOVY.  There was mention of her having some itching while on tenofovir but I suspect this was not due to tenofovir and she will be  having a lower dose of it with DESCOVY.  Past Medical History:  Diagnosis Date  . Anxiety   . Arthritis   . Cancer ([place])   . CVA (cerebral infarction)   . GERD (gastroesophageal reflux disease)   . HIV infection ([place])   . HTN (hypertension)   . Hyperlipidemia   . Morbid obesity with BMI of 40.0-44.9, adult ([place])   . Postmenopausal bleeding   . Stroke [facility])    no residual paralysis    Past Surgical History:  Procedure Laterality Date  . COLONOSCOPY  2000  . HYSTEROSCOPY WITH D & C N/A 04/19/2013   Procedure: DILATATION AND CURETTAGE /HYSTEROSCOPY;  Surgeon: Emily Filbert, MD;  Location: [place] [facility];  Service: Gynecology;  Laterality: N/A;  . LYMPH NODE BIOPSY Right 09/17/2020   Procedure: LYMPH NODE BIOPSY; INGUINAL;  Surgeon: Virl Cagey, MD;  Location: [facility];  Service: General;  Laterality: Right;    Family History  Problem Relation Age of Onset  . Hypertension Mother   . Hypertension Sister   . Arthritis Sister   . Cancer Other   . Diabetes Other       Social History   Socioeconomic History  . Marital status: Widowed    Spouse name: Not on file  . Number of children: Not on file  . Years of education: Not on file  . Highest education level: 10th grade  Occupational History  . Not on file  Tobacco Use  . Smoking status: Former Smoker    Packs/day: 1.00    Years: 5.00    Pack years: 5.00    Types: Cigarettes  . Smokeless tobacco: Never Used  Vaping Use  . Vaping Use: Never used  Substance and Sexual Activity  . Alcohol use: No  . Drug use: No  . Sexual activity: Not Currently    Birth control/protection: None, Post-menopausal  Other Topics Concern  . Not on file  Social History Narrative   Grew up in [place], [place].   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   2 children, boy and girl. Son is 64s, and special needs.   Daughter in her 82s, is a Marine scientist.   Eats all food groups.   Wears seatbelt.   Attends church.   Enjoys watching tv,  listening to [employer], and spending time with friends.   Enjoys going out to eat.    Social Determinants of Health   Financial Resource Strain: Low Risk   . Difficulty of Paying Living Expenses: Not hard at all  Food Insecurity: No Food Insecurity  . Worried About Charity fundraiser in the Last Year: Never true  . Ran Out of Food in the Last Year: Never true  Transportation Needs: No Transportation Needs  . Lack of Transportation (Medical): No  . Lack of Transportation (Non-Medical): No  Physical Activity: Sufficiently Active  . Days of Exercise per Week: 7 days  . Minutes of Exercise per  Session: 60 min  Stress: Stress Concern Present  . Feeling of Stress : To some extent  Social Connections: Moderately Isolated  . Frequency of Communication with Friends and Family: More than three times a week  . Frequency of Social Gatherings with Friends and Family: Once a week  . Attends Religious Services: More than 4 times per year  . Active Member of Clubs or Organizations: No  . Attends Archivist Meetings: Never  . Marital Status: Widowed    Allergies  Allergen Reactions  . Gadolinium Derivatives Nausea And Vomiting    Vomited with multihance 12/05/19  . Amlodipine Other (See Comments)  . Ivp Dye [Iodinated Diagnostic Agents] Other (See Comments)    Caused kidney damage  . Penicillins Swelling  . Tape Dermatitis  . Cephalexin Rash  . Latex Rash     Current Outpatient Medications:  .  aspirin (PX ENTERIC ASPIRIN) 81 MG EC tablet, Take 1 tablet (81 mg total) by mouth daily. Swallow whole., Disp: 30 tablet, Rfl: 12 .  colchicine 0.6 MG tablet, Take 0.6 mg by mouth as needed (for gout flares). , Disp: , Rfl:  .  darunavir (PREZISTA) 600 MG tablet, Take 1 tablet (600 mg total) by mouth 2 (two) times daily with a meal., Disp: 60 tablet, Rfl: 11 .  diazepam (VALIUM) 2 MG tablet, Take 2 mg by mouth daily as needed for anxiety., Disp: , Rfl:  .  dolutegravir (TIVICAY) 50  MG tablet, Take 1 tablet (50 mg total) by mouth daily., Disp: 30 tablet, Rfl: 11 .  DULoxetine (CYMBALTA) 60 MG capsule, Take 60 mg by mouth daily., Disp: , Rfl:  .  ibuprofen (ADVIL) 200 MG tablet, Take 200 mg by mouth every 8 (eight) hours as needed for moderate pain., Disp: , Rfl:  .  metoprolol succinate (TOPROL-XL) 25 MG 24 hr tablet, Take 25 mg by mouth at bedtime., Disp: , Rfl:  .  olmesartan (BENICAR) 20 MG tablet, Take 20 mg by mouth daily., Disp: , Rfl:  .  pantoprazole (PROTONIX) 40 MG tablet, Take 40 mg by mouth daily., Disp: , Rfl:  .  ritonavir (NORVIR) 100 MG TABS tablet, Take 1 tablet (100 mg total) by mouth daily., Disp: 30 tablet, Rfl: 11 .  tetrahydrozoline 0.05 % ophthalmic solution, Place 1 drop into both eyes daily as needed (red eyes)., Disp: , Rfl:  .  triamterene-hydrochlorothiazide (MAXZIDE-25) 37.5-25 MG tablet, Take 1 tablet by mouth daily., Disp: , Rfl:  .  vitamin C (ASCORBIC ACID) 500 MG tablet, Take 500 mg by mouth daily., Disp: , Rfl:  .  Vitamin D, Ergocalciferol, (DRISDOL) 1.25 MG (50000 UNIT) CAPS capsule, Take 50,000 Units by mouth every 7 (seven) days., Disp: , Rfl:  .  acetaminophen (TYLENOL) 325 MG tablet, 1 tablet as needed (Patient not taking: Reported on 11/21/2020), Disp: , Rfl:  .  oxyCODONE (ROXICODONE) 5 MG immediate release tablet, Take 1 tablet (5 mg total) by mouth every 4 (four) hours as needed for severe pain or breakthrough pain. (Patient not taking: Reported on 11/21/2020), Disp: 10 tablet, Rfl: 0   Review of Systems  Constitutional: Negative for activity change, appetite change, chills, diaphoresis, fatigue, fever and unexpected weight change.  HENT: Negative for congestion, rhinorrhea, sinus pressure, sneezing, sore throat and trouble swallowing.   Eyes: Negative for photophobia and visual disturbance.  Respiratory: Negative for cough, chest tightness, shortness of breath, wheezing and stridor.   Cardiovascular: Negative for chest pain,  palpitations and leg swelling.  Gastrointestinal: Negative for abdominal distention, abdominal pain, anal bleeding, blood in stool, constipation, diarrhea, nausea and vomiting.  Genitourinary: Negative for difficulty urinating, dysuria, flank pain and hematuria.  Musculoskeletal: Negative for arthralgias, back pain, gait problem, joint swelling and myalgias.  Skin: Positive for rash. Negative for color change, pallor and wound.  Neurological: Negative for dizziness, tremors, weakness and light-headedness.  Hematological: Negative for adenopathy. Does not bruise/bleed easily.  Psychiatric/Behavioral: Negative for agitation, behavioral problems, confusion, decreased concentration, dysphoric mood and sleep disturbance.       Objective:   Physical Exam Constitutional:      General: She is not in acute distress.    Appearance: Normal appearance. She is well-developed. She is not ill-appearing or diaphoretic.  HENT:     Head: Normocephalic and atraumatic.     Right Ear: Hearing and external ear normal.     Left Ear: Hearing and external ear normal.     Nose: No nasal deformity or rhinorrhea.  Eyes:     General: No scleral icterus.    Conjunctiva/sclera: Conjunctivae normal.     Right eye: Right conjunctiva is not injected.     Left eye: Left conjunctiva is not injected.     Pupils: Pupils are equal, round, and reactive to light.  Neck:     Vascular: No JVD.  Cardiovascular:     Rate and Rhythm: Normal rate and regular rhythm.     Heart sounds: S1 normal and S2 normal.  Pulmonary:     Breath sounds: No wheezing.  Abdominal:     General: Bowel sounds are normal. There is no distension.     Palpations: Abdomen is soft.     Tenderness: There is no abdominal tenderness.  Musculoskeletal:        General: Normal range of motion.     Right shoulder: Normal.     Left shoulder: Normal.     Cervical back: Normal range of motion and neck supple.     Right hip: Normal.     Left hip: Normal.      Right knee: Normal.     Left knee: Normal.  Lymphadenopathy:     Head:     Right side of head: No submandibular, preauricular or posterior auricular adenopathy.     Left side of head: No submandibular, preauricular or posterior auricular adenopathy.     Cervical: No cervical adenopathy.     Right cervical: No superficial or deep cervical adenopathy.    Left cervical: No superficial or deep cervical adenopathy.  Skin:    General: Skin is warm and dry.     Coloration: Skin is not pale.     Findings: No abrasion, bruising, ecchymosis, erythema, lesion or rash.     Nails: There is no clubbing.  Neurological:     General: No focal deficit present.     Mental Status: She is alert and oriented to person, place, and time.     Sensory: No sensory deficit.     Coordination: Coordination normal.     Gait: Gait normal.  Psychiatric:        Attention and Perception: She is attentive.        Mood and Affect: Mood normal.        Speech: Speech normal.        Behavior: Behavior normal. Behavior is cooperative.        Thought Content: Thought content normal.        Judgment: Judgment normal.  Rash on abdomen November 21, 2020:           Assessment & Plan:  MDR HIV:  Add Descovy to her Prezista 600 with Norvir 100mg  BID and Tivicay once daily  We will endeavor to obtain powder form of Norvir to replace Norvir she is crushing  Check VL today w type and have scheduled her to see me next week.  Would like to switch her meds over to was a long to simplify obtaining them and making sure she is on the correct regimen.  Rash to have biopsy by dermatology.  Esophageal stricture she has had multiple dilations of this though I think some of her difficulty swallowing is likely psychogenic I constantly appreciate that she has had actual esophageal pathology  I spent greater than 40 minutes with the patient including greater than 50% of time in face to face counsel of the patient and  her daughter re her MDR virus , being outside records from [facility] reviewing her current regimen current options for treatment with  Onnie Boer and in coordination of her care.

## 2020-11-22 ENCOUNTER — Other Ambulatory Visit: Payer: Self-pay | Admitting: Infectious Disease

## 2020-11-22 LAB — T-HELPER CELL (CD4) - (RCID CLINIC ONLY)
CD4 % Helper T Cell: 11 % — ABNORMAL LOW (ref 33–65)
CD4 T Cell Abs: 239 /uL — ABNORMAL LOW (ref 400–1790)

## 2020-11-22 MED ORDER — DOLUTEGRAVIR SODIUM 50 MG PO TABS: 50.0000 mg | ORAL_TABLET | Freq: Every day | ORAL | 11 refills | Status: DC

## 2020-11-22 MED ORDER — NORVIR 100 MG PO PACK: 100.0000 mg | PACK | Freq: Two times a day (BID) | ORAL | 11 refills | Status: DC

## 2020-11-22 MED ORDER — DARUNAVIR ETHANOLATE 600 MG PO TABS: 600.0000 mg | ORAL_TABLET | Freq: Two times a day (BID) | ORAL | 11 refills | Status: DC

## 2020-11-23 NOTE — Telephone Encounter (Signed)
Norvir is on back order. Pharmacy is requesting alternative option for patient

## 2020-11-26 ENCOUNTER — Telehealth: Payer: Self-pay

## 2020-11-26 ENCOUNTER — Other Ambulatory Visit (HOSPITAL_COMMUNITY): Payer: Self-pay

## 2020-11-26 DIAGNOSIS — B20 Human immunodeficiency virus [HIV] disease: Secondary | ICD-10-CM

## 2020-11-26 MED ORDER — RITONAVIR 100 MG PO PACK: PACK | ORAL | 11 refills | Status: DC

## 2020-11-26 MED ORDER — DOLUTEGRAVIR SODIUM 50 MG PO TABS: ORAL_TABLET | Freq: Every day | ORAL | 11 refills | Status: DC

## 2020-11-26 MED ORDER — EMTRICITABINE-TENOFOVIR AF 200-25 MG PO TABS: 1.0000 | ORAL_TABLET | Freq: Every day | ORAL | 4 refills | Status: DC

## 2020-11-26 MED ORDER — DARUNAVIR ETHANOLATE 600 MG PO TABS: ORAL_TABLET | ORAL | 11 refills | Status: AC

## 2020-11-26 NOTE — Telephone Encounter (Signed)
RN called [facility] to cancel patient's Prezista, Tivicay, Descovy, and ritonavir. Will send complete regimen to [facility].   Beryle Flock, RN

## 2020-11-26 NOTE — Telephone Encounter (Signed)
-----   Message from Roney Jaffe, CPhT sent at 11/26/2020 10:25 AM EDT ----- Regarding: medications Hey Laura Jimenez ,  Dr [name] had her HIV medications go to [facility] but she uses [facility] in [place] . Can you re send her scripts there please .     Thank You,  [name], [place] Patient [facility] Phone: [phone] Fax: [phone]

## 2020-11-29 ENCOUNTER — Ambulatory Visit (INDEPENDENT_AMBULATORY_CARE_PROVIDER_SITE_OTHER): Payer: Medicare Other | Admitting: Infectious Disease

## 2020-11-29 ENCOUNTER — Telehealth: Payer: Self-pay

## 2020-11-29 ENCOUNTER — Other Ambulatory Visit (HOSPITAL_COMMUNITY): Payer: Self-pay

## 2020-11-29 ENCOUNTER — Other Ambulatory Visit: Payer: Self-pay

## 2020-11-29 VITALS — BP 142/88 | HR 60 | Temp 98.2°F | Wt 242.0 lb

## 2020-11-29 DIAGNOSIS — R1319 Other dysphagia: Secondary | ICD-10-CM | POA: Diagnosis not present

## 2020-11-29 DIAGNOSIS — I10 Essential (primary) hypertension: Secondary | ICD-10-CM | POA: Diagnosis not present

## 2020-11-29 DIAGNOSIS — B20 Human immunodeficiency virus [HIV] disease: Secondary | ICD-10-CM

## 2020-11-29 DIAGNOSIS — Z7689 Persons encountering health services in other specified circumstances: Secondary | ICD-10-CM | POA: Diagnosis not present

## 2020-11-29 NOTE — Telephone Encounter (Signed)
Yeah not simple!

## 2020-11-29 NOTE — Telephone Encounter (Signed)
RN received call from [facility] stating they do not have ritonavir powder. Per Butch Penny, she has attempted to find ritonavir powder but it is on back order and she has been unable to find it at any pharmacy.   Beryle Flock, RN

## 2020-11-29 NOTE — Progress Notes (Signed)
Subjective:  Chief complaint: Concerns about potential nephrotoxicity of DESCOVY   Patient ID: Laura Jimenez, female    DOB: 1950-09-15, 70 y.o.   MRN: [id number]  HPI   Laura Jimenez is a 70 year old African-American lady diagnosed with HIV in the 1990s who has a history of multidrug-resistant HIV and intermittent adherence to medications.  While she was at [facility] she was on a regimen of twice daily boosted darunavir (prezista) with norvir along with emtricitabine and isentress BID and with VL <20 fairly consistently in 2016 period, then changed from [facility] to [facility] for simplification of her regimen with reduction of pill burden and also giving her an integrase strand transfer inhibitor with higher barrier to resistance.  The patient has told us that she done developed a rash while on TIVICAY.  Certainly I cannot find documentation of her having developed a rash when she was switched to [facility] and she remained undetectable through several visits on this regimen of TIVICAY once daily Emtricitabine  and boosted darunavir ritonavir twice daily.  She is subsequently transferred care [facility] and she is largely been viremic although she did get a viral load down to 653 and November 29 of 2021.  There was a very nice note written at [facility] which Dr. Gale Journey had put into the chart which summarize all of the resistance mutations over time that the patient had documented since 2007.  I have put those mutations back into [employer] and the results are below.      Drug resistance interpretation: PR  HIVDB 9.0 (2019-10-17) PI Major Resistance Mutations:  M46I, I54V, I84V, L90IMP  PI Accessory Resistance Mutations : G73S Other Mutations: L10I, L63P, A71V, V77I  Protease Inhibitors atazanavir/r (ATV/r)    High-Level Resistance darunavir/r (DRV/r)     Low-Level Resistance lopinavir/r  (LPV/r)        High-Level Resistance   PR comments PI Major M46I/L are relatively non-polymorphic PI-selected mutations. In combination with other PI-resistance mutations, they are associated with reduced susceptibility to each of the PIs except DRV. I54V is a non-polymorphic PI-selected mutation that contributes reduced susceptibility to each of the PIs except DRV. I84V is a non-polymorphic mutation selected by each of the PIs. It causes high-level resistance to ATV, FPV, IDV, NFV and SQV, intermediate resistance to LPV and TPV, and low-level resistance to DRV. L90M is a non-polymorphic PI-selected mutation that reduces susceptibility to each of the PIs except TPV and DRV. PI Accessory G73S/T/C/A are non-polymorphic accessory PI-selected mutations. They are associated primarily with reduced susceptibility to ATV, SQV, FPV, IDV, and NFV. G73V/D are rare non-polymorphic PI-selected mutations. Other L10I/V are polymorphic, PI-selected accessory mutations that increase the replication of viruses with other PI-resistance mutations. A71V/T are polymorphic, PI-selected accessory mutations that increase the replication of viruses with other PI-resistance mutations. L90M is a non-polymorphic PI-selected mutation which reduces susceptibility to each of the PIs except TPV and DRV. L90IP is a highly unusual mutation at this position. Dosage Considerations There is evidence for low-level DRV resistance. If DRV is administered it should be used twice daily.  Mutation scoring: PR HIVDB 9.0 (2019-10-17) PI         ATV/r   DRV/r  LPV/r M46I    10        0          10 M46I + I84V + L90IMP            5  0          5 M46I + L90IMP           10        0          0 I54V     15        0          15 I54V + L90IMP            10        0          5 G73S   10        0          5 G73S + L90IMP          10        0          0 I84V     60        15        30 L90IMP            25        0           15 Total    155      15        85  Drug resistance interpretation: RT HIVDB 9.0 (2019-10-17)  NRTI Resistance Mutations:  M41L, M184V, L210W, T215Y NNRTI Resistance Mutations: K103N, Y181C, G190A  Other Mutations: None   Nucleoside Reverse Transcriptase Inhibitors abacavir (ABC)           High-Level Resistance zidovudine (AZT)        High-Level Resistance emtricitabine (FTC)     High-Level Resistance lamivudine (3TC)        High-Level Resistance tenofovir (TDF)            Intermediate Resistance Non-nucleoside Reverse Transcriptase Inhibitors doravirine (DOR)         Intermediate Resistance efavirenz (EFV)           High-Level Resistance etravirine (ETR)          Intermediate Resistance nevirapine (NVP)        High-Level Resistance rilpivirine (RPV)           High-Level Resistance   RT comments NRTI M41L is a TAM that usually occurs with T215Y. In combination, M41L plus T215Y confer intermediate / high-level resistance to AZT and d4T and contribute to reduced ddI, ABC and TDF susceptibility. M184V/I cause high-level in vitro resistance to 3TC and FTC and low-level resistance to ddI and ABC. However, M184V/I are not contraindications to continued treatment with 3TC or FTC because they increase susceptibility to AZT, TDF and d4T and are associated with clinically significant reductions in HIV-1 replication. L210W is a TAM that usually occurs in combination with M41L and T215Y. The combination of M41, L210W and T215Y causes high-level resistance to AZT and d4T and intermediate to high-level resistance to ddI, ABC and TDF. T215Y is a TAM that causes intermediate/high-level resistance to AZT and d4T, low-level resistance to ddI, and potentially low-level resistance to ABC and TDF. NNRTI K103N is a non-polymorphic mutation that causes high-level reductions in NVP and EFV susceptibility. Y181C is a non-polymorphic mutation selected in patients receiving NVP, ETR and RPV. It  reduces susceptibility to NVP, ETR, RPV, and EFV by >50-fold, 5-fold, 3-fold, and 2-fold, respectively. Although Y181C itself reduces EFV susceptibility by only 2-fold, it has been associated with a reduced response to an EFV-containing regimen in NNRTI-experienced  patients. Y181C has a weight of 2.5 in the [employer] ETR genotypic susceptibility score. Alone, it does not appear to reduce DOR susceptibility. G190A is a non-polymorphic mutation that causes high-level resistance to NVP and intermediate resistance to EFV. It has a weight of 1.0 in the [employer] ETR genotypic susceptibility score but does not appear to be selected by ETR or RPV or to reduce their in vitro susceptibility in the absence of other NNRTI-resistance mutations. It also does not appear to reduce DOR susceptibility. Mutation scoring: RT HIVDB 9.0 (2019-10-17) NRTI    ABC     AZT     FTC     3TC     TDF M41L   5          15        0          0          5 M41L + L210W           10        10        0          0          10 M41L + L210W + T215Y        5          0          5          5          5  M41L + T215Y            15        10        5          5          10  M184V 15        -10       60        60        -10 L210W 5          15        0          0          5 L210W + T215Y          10        10        5          5          10  T215Y  10        40        0          0          10 Total    75        90        75        75        45 NNRTI DOR    EFV     ETR     NVP     RPV K103N + Y181C          10        0          0          0          0 Y181C 10        30  30        60        45 Y181C + G190A          20        0          10        0          10 K103N 0          60        0          60        0 G190A 0          45        10        60        15 Total    40        135      50        180      70   She appears to frequently only take the boosted protease inhibitor not take the [facility] because she is concerned that is causing  the rash that she has.  Dr. Gearldine Shown has been skeptical of this and I am as well.  She is scheduled for a biopsy with dermatology in April.  She was scheduled with me to consider different regimens and options for her to try to improve her adherence.  She was accompanied by her daughter to clinic last week.  They again detailed the issues she has with esophageal stricture.  She does crush both the Prezista and the Norvir but is able to take the [facility] without difficulty.  I checked with Patrick Jupiter D and he had informed me that Norvir should not be crushed due to it solubility issues.  Endeavored to procure powdered no Norvir and I sent prescriptions to [facility] for this along with a prescription for DESCOVY which I thought she really needed added to her regimen.  It turns out that scripts were then converted over to her pharmacy that she prefers and she came to clinic with medications from there.  However the that pharmacy also does not have powdered NORVIR the prescription is written as if it is powder NORVIR that can be dissolved but clearly on examining the tablets they are not powdered nor dissolvable.  I had extensive discussion with Butch Penny our pharmacy tech and it looks as if [facility] cannot procure NORVIR powdered packs either and she is try to find a pharmacy that can from seller.  The patient in the interim has been taking her medications with crushed Prezista twice daily along with crushed Norvir twice daily TIVICAY once daily and DESCOVY once daily.       Past Medical History:  Diagnosis Date  . Anxiety   . Arthritis   . Cancer ([place])   . CVA (cerebral infarction)   . GERD (gastroesophageal reflux disease)   . HIV infection ([place])   . HTN (hypertension)   . Hyperlipidemia   . Morbid obesity with BMI of 40.0-44.9, adult ([place])   . Postmenopausal bleeding   . Stroke [facility])    no residual paralysis    Past Surgical History:  Procedure Laterality Date   . COLONOSCOPY  2000  . HYSTEROSCOPY WITH D & C N/A 04/19/2013   Procedure: DILATATION AND CURETTAGE /HYSTEROSCOPY;  Surgeon: Emily Filbert, MD;  Location: [facility];  Service: Gynecology;  Laterality: N/A;  . LYMPH  NODE BIOPSY Right 09/17/2020   Procedure: LYMPH NODE BIOPSY; INGUINAL;  Surgeon: Virl Cagey, MD;  Location: [facility];  Service: General;  Laterality: Right;    Family History  Problem Relation Age of Onset  . Hypertension Mother   . Hypertension Sister   . Arthritis Sister   . Cancer Other   . Diabetes Other       Social History   Socioeconomic History  . Marital status: Widowed    Spouse name: Not on file  . Number of children: Not on file  . Years of education: Not on file  . Highest education level: 10th grade  Occupational History  . Not on file  Tobacco Use  . Smoking status: Former Smoker    Packs/day: 1.00    Years: 5.00    Pack years: 5.00    Types: Cigarettes  . Smokeless tobacco: Never Used  Vaping Use  . Vaping Use: Never used  Substance and Sexual Activity  . Alcohol use: No  . Drug use: No  . Sexual activity: Not Currently    Birth control/protection: None, Post-menopausal  Other Topics Concern  . Not on file  Social History Narrative   Grew up in [place], [place].   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   2 children, boy and girl. Son is 8s, and special needs.   Daughter in her 56s, is a Marine scientist.   Eats all food groups.   Wears seatbelt.   Attends church.   Enjoys watching tv, listening to [employer], and spending time with friends.   Enjoys going out to eat.    Social Determinants of Health   Financial Resource Strain: Low Risk   . Difficulty of Paying Living Expenses: Not hard at all  Food Insecurity: No Food Insecurity  . Worried About Charity fundraiser in the Last Year: Never true  . Ran Out of Food in the Last Year: Never true  Transportation Needs: No Transportation Needs  . Lack of Transportation (Medical):  No  . Lack of Transportation (Non-Medical): No  Physical Activity: Sufficiently Active  . Days of Exercise per Week: 7 days  . Minutes of Exercise per Session: 60 min  Stress: Stress Concern Present  . Feeling of Stress : To some extent  Social Connections: Moderately Isolated  . Frequency of Communication with Friends and Family: More than three times a week  . Frequency of Social Gatherings with Friends and Family: Once a week  . Attends Religious Services: More than 4 times per year  . Active Member of Clubs or Organizations: No  . Attends Archivist Meetings: Never  . Marital Status: Widowed    Allergies  Allergen Reactions  . Gadolinium Derivatives Nausea And Vomiting    Vomited with multihance 12/05/19  . Amlodipine Other (See Comments)  . Ivp Dye [Iodinated Diagnostic Agents] Other (See Comments)    Caused kidney damage  . Penicillins Swelling  . Tape Dermatitis  . Cephalexin Rash  . Latex Rash     Current Outpatient Medications:  .  acetaminophen (TYLENOL) 325 MG tablet, , Disp: , Rfl:  .  aspirin (PX ENTERIC ASPIRIN) 81 MG EC tablet, Take 1 tablet (81 mg total) by mouth daily. Swallow whole., Disp: 30 tablet, Rfl: 12 .  darunavir (PREZISTA) 600 MG tablet, TAKE 1 TABLET (600 MG TOTAL) BY MOUTH 2 (TWO) TIMES DAILY WITH A MEAL. CRUSH AND TAKE BID WITH NORVIR POWDER PACKETS BID, Disp: 60 tablet,  Rfl: 11 .  diazepam (VALIUM) 2 MG tablet, Take 2 mg by mouth daily as needed for anxiety., Disp: , Rfl:  .  dolutegravir (TIVICAY) 50 MG tablet, TAKE 1 TABLET (50 MG TOTAL) BY MOUTH DAILY., Disp: 30 tablet, Rfl: 11 .  DULoxetine (CYMBALTA) 60 MG capsule, Take 60 mg by mouth daily., Disp: , Rfl:  .  emtricitabine-tenofovir AF (DESCOVY) 200-25 MG tablet, TAKE 1 TABLET BY MOUTH DAILY., Disp: 30 tablet, Rfl: 4 .  ibuprofen (ADVIL) 200 MG tablet, Take 200 mg by mouth every 8 (eight) hours as needed for moderate pain., Disp: , Rfl:  .  metoprolol succinate (TOPROL-XL) 25 MG 24  hr tablet, Take 25 mg by mouth at bedtime., Disp: , Rfl:  .  olmesartan (BENICAR) 20 MG tablet, Take 20 mg by mouth daily., Disp: , Rfl:  .  pantoprazole (PROTONIX) 40 MG tablet, Take 40 mg by mouth daily., Disp: , Rfl:  .  Ritonavir 100 MG PACK, TAKE 100 MG BY MOUTH IN THE MORNING AND AT BEDTIME. THIS POWDER CAN BE MIXED IN A SOLUTION, Disp: 60 each, Rfl: 11 .  tetrahydrozoline 0.05 % ophthalmic solution, Place 1 drop into both eyes daily as needed (red eyes)., Disp: , Rfl:  .  triamterene-hydrochlorothiazide (MAXZIDE-25) 37.5-25 MG tablet, Take 1 tablet by mouth daily., Disp: , Rfl:  .  vitamin C (ASCORBIC ACID) 500 MG tablet, Take 500 mg by mouth daily., Disp: , Rfl:  .  Vitamin D, Ergocalciferol, (DRISDOL) 1.25 MG (50000 UNIT) CAPS capsule, Take 50,000 Units by mouth every 7 (seven) days., Disp: , Rfl:  .  oxyCODONE (ROXICODONE) 5 MG immediate release tablet, Take 1 tablet (5 mg total) by mouth every 4 (four) hours as needed for severe pain or breakthrough pain. (Patient not taking: Reported on 11/21/2020), Disp: 10 tablet, Rfl: 0    Review of Systems  Constitutional: Negative for activity change, appetite change, chills, diaphoresis, fatigue, fever and unexpected weight change.  HENT: Negative for congestion, rhinorrhea, sinus pressure, sneezing, sore throat and trouble swallowing.   Eyes: Negative for photophobia and visual disturbance.  Respiratory: Negative for cough, chest tightness, shortness of breath, wheezing and stridor.   Cardiovascular: Negative for chest pain, palpitations and leg swelling.  Gastrointestinal: Negative for abdominal distention, abdominal pain, anal bleeding, blood in stool, constipation, diarrhea, nausea and vomiting.  Genitourinary: Negative for difficulty urinating, dysuria, flank pain and hematuria.  Musculoskeletal: Negative for arthralgias, back pain, gait problem, joint swelling and myalgias.  Skin: Negative for color change, pallor, rash and wound.   Neurological: Negative for dizziness, tremors, weakness and light-headedness.  Hematological: Negative for adenopathy. Does not bruise/bleed easily.  Psychiatric/Behavioral: Negative for agitation, behavioral problems, confusion, decreased concentration, dysphoric mood and sleep disturbance.       Objective:   Physical Exam Constitutional:      General: She is not in acute distress.    Appearance: Normal appearance. She is well-developed. She is not ill-appearing or diaphoretic.  HENT:     Head: Normocephalic and atraumatic.     Right Ear: Hearing and external ear normal.     Left Ear: Hearing and external ear normal.     Nose: No nasal deformity or rhinorrhea.  Eyes:     General: No scleral icterus.    Conjunctiva/sclera: Conjunctivae normal.     Right eye: Right conjunctiva is not injected.     Left eye: Left conjunctiva is not injected.     Pupils: Pupils are equal, round, and reactive to  light.  Neck:     Vascular: No JVD.  Cardiovascular:     Rate and Rhythm: Normal rate and regular rhythm.     Heart sounds: Normal heart sounds, S1 normal and S2 normal. No murmur heard. No friction rub.  Abdominal:     General: Bowel sounds are normal. There is no distension.     Palpations: Abdomen is soft.     Tenderness: There is no abdominal tenderness.  Musculoskeletal:        General: Normal range of motion.     Right shoulder: Normal.     Left shoulder: Normal.     Cervical back: Normal range of motion and neck supple.     Right hip: Normal.     Left hip: Normal.     Right knee: Normal.     Left knee: Normal.  Lymphadenopathy:     Head:     Right side of head: No submandibular, preauricular or posterior auricular adenopathy.     Left side of head: No submandibular, preauricular or posterior auricular adenopathy.     Cervical: No cervical adenopathy.     Right cervical: No superficial or deep cervical adenopathy.    Left cervical: No superficial or deep cervical adenopathy.   Skin:    General: Skin is warm and dry.     Coloration: Skin is not pale.     Findings: No abrasion, bruising, ecchymosis, erythema, lesion or rash.     Nails: There is no clubbing.  Neurological:     General: No focal deficit present.     Mental Status: She is alert and oriented to person, place, and time.     Sensory: No sensory deficit.     Coordination: Coordination normal.     Gait: Gait normal.  Psychiatric:        Attention and Perception: She is attentive.        Mood and Affect: Mood normal.        Speech: Speech normal.        Behavior: Behavior normal. Behavior is cooperative.        Thought Content: Thought content normal.        Judgment: Judgment normal.           Assessment & Plan:  Multi-drug-resistant HIV problems with tolerating different drugs.  We will continue with current regimen of Prezista 600 mg and 100 mg of Norvir twice daily.  Crushing the NORVIR is not supposed to be sent and it is doable but she did this before [facility] with virological suppression.  We will continue to endeavor to get powdered Norvir that she can mix up and ingest that way.  We also continue TIVICAY once daily and DESCOVY once daily.  No load and genotype is pending  I will see her back in 1 month's time.  Esophageal strictures: She has had need for Gilette dilations in the past  I spent greater than 40 minutes with the patient including greater than 50% of time in face to face counsel of the patient and her daughter regarding the nature of patient's HIV her progress during her life journey.  We also had extensive discussions around the different antiretrovirals including DESCOVY and in particular with regards to her concerns about nephrotoxicity, going to great lengths to explain differences between the older formulation of tenofovir found in Truvada and that found in DESCOVY with the latter having minimal risk for nephrotoxicity due to her far superior penetration into  white  blood cells and minimal amount left over in circulation, along with also coordinating her care with infectious disease pharmacy

## 2020-12-02 LAB — COMPLETE METABOLIC PANEL WITH GFR
AG Ratio: 0.7 (calc) — ABNORMAL LOW (ref 1.0–2.5)
ALT: 11 U/L (ref 6–29)
AST: 16 U/L (ref 10–35)
Albumin: 3.5 g/dL — ABNORMAL LOW (ref 3.6–5.1)
Alkaline phosphatase (APISO): 80 U/L (ref 37–153)
BUN/Creatinine Ratio: 15 (calc) (ref 6–22)
BUN: 15 mg/dL (ref 7–25)
CO2: 27 mmol/L (ref 20–32)
Calcium: 9 mg/dL (ref 8.6–10.4)
Chloride: 105 mmol/L (ref 98–110)
Creat: 1.03 mg/dL — ABNORMAL HIGH (ref 0.50–0.99)
GFR, Est African American: 65 mL/min/{1.73_m2} (ref >=60)
GFR, Est Non African American: 56 mL/min/{1.73_m2} — ABNORMAL LOW (ref >=60)
Globulin: 5 g/dL (calc) — ABNORMAL HIGH (ref 1.9–3.7)
Glucose, Bld: 93 mg/dL (ref 65–99)
Potassium: 4.4 mmol/L (ref 3.5–5.3)
Sodium: 137 mmol/L (ref 135–146)
Total Bilirubin: 0.5 mg/dL (ref 0.2–1.2)
Total Protein: 8.5 g/dL — ABNORMAL HIGH (ref 6.1–8.1)

## 2020-12-02 LAB — CBC WITH DIFFERENTIAL/PLATELET
Absolute Monocytes: 351 cells/uL (ref 200–950)
Basophils Absolute: 59 cells/uL (ref 0–200)
Basophils Relative: 1.3 %
Eosinophils Absolute: 81 cells/uL (ref 15–500)
Eosinophils Relative: 1.8 %
HCT: 37 % (ref 35.0–45.0)
Hemoglobin: 12 g/dL (ref 11.7–15.5)
Lymphs Abs: 2340 cells/uL (ref 850–3900)
MCH: 30.9 pg (ref 27.0–33.0)
MCHC: 32.4 g/dL (ref 32.0–36.0)
MCV: 95.4 fL (ref 80.0–100.0)
MPV: 10.4 fL (ref 7.5–12.5)
Monocytes Relative: 7.8 %
Neutro Abs: 1670 cells/uL (ref 1500–7800)
Neutrophils Relative %: 37.1 %
Platelets: 125 10*3/uL — ABNORMAL LOW (ref 140–400)
RBC: 3.88 10*6/uL (ref 3.80–5.10)
RDW: 13.4 % (ref 11.0–15.0)
Total Lymphocyte: 52 %
WBC: 4.5 10*3/uL (ref 3.8–10.8)

## 2020-12-02 LAB — HIV RNA, RTPCR W/R GT (RTI, PI,INT)
HIV 1 RNA Quant: 95 copies/mL — ABNORMAL HIGH
HIV-1 RNA Quant, Log: 1.98 Log copies/mL — ABNORMAL HIGH

## 2020-12-03 ENCOUNTER — Telehealth: Payer: Self-pay

## 2020-12-03 NOTE — Telephone Encounter (Signed)
Relayed results per MD to patient. Patient does not have any questions at this time.

## 2020-12-03 NOTE — Telephone Encounter (Signed)
-----   Message from Truman Hayward, MD sent at 12/03/2020  8:23 AM EDT ----- Can we let Mardene Celeste and her daughter know how proud I am of her because now she is almost undetectable w VL of 95~!

## 2020-12-11 ENCOUNTER — Encounter: Payer: Self-pay | Admitting: Infectious Disease

## 2020-12-21 DIAGNOSIS — L910 Hypertrophic scar: Secondary | ICD-10-CM | POA: Diagnosis not present

## 2020-12-21 DIAGNOSIS — L989 Disorder of the skin and subcutaneous tissue, unspecified: Secondary | ICD-10-CM | POA: Diagnosis not present

## 2020-12-28 DIAGNOSIS — Z4802 Encounter for removal of sutures: Secondary | ICD-10-CM | POA: Diagnosis not present

## 2021-01-01 DIAGNOSIS — L905 Scar conditions and fibrosis of skin: Secondary | ICD-10-CM | POA: Diagnosis not present

## 2021-01-04 ENCOUNTER — Other Ambulatory Visit: Payer: Self-pay

## 2021-01-04 ENCOUNTER — Other Ambulatory Visit (HOSPITAL_COMMUNITY)
Admission: RE | Admit: 2021-01-04 | Discharge: 2021-01-04 | Disposition: A | Discharge status: Home / Self Care | Payer: Medicare Other | Source: Ambulatory Visit | Attending: Infectious Disease | Admitting: Infectious Disease

## 2021-01-04 ENCOUNTER — Encounter: Payer: Self-pay | Admitting: Infectious Disease

## 2021-01-04 ENCOUNTER — Ambulatory Visit (INDEPENDENT_AMBULATORY_CARE_PROVIDER_SITE_OTHER): Payer: Medicare Other | Admitting: Infectious Disease

## 2021-01-04 VITALS — BP 157/88 | HR 81 | Temp 98.0°F | Wt 251.0 lb

## 2021-01-04 DIAGNOSIS — B20 Human immunodeficiency virus [HIV] disease: Secondary | ICD-10-CM | POA: Insufficient documentation

## 2021-01-04 DIAGNOSIS — E782 Mixed hyperlipidemia: Secondary | ICD-10-CM | POA: Diagnosis not present

## 2021-01-04 DIAGNOSIS — Z7185 Encounter for immunization safety counseling: Secondary | ICD-10-CM

## 2021-01-04 NOTE — Progress Notes (Signed)
Subjective:  Chief complaint: Follow-up for HIV disease on medications.   Patient ID: Laura Jimenez, female    DOB: 1951-07-21, 70 y.o.   MRN: [id number]  HPI   Laura Jimenez is a 70 year old African-American lady diagnosed with HIV in the 1990s who has a history of multidrug-resistant HIV and intermittent adherence to medications.  While she was at [facility] she was on a regimen of twice daily boosted darunavir (prezista) with norvir along with emtricitabine and isentress BID and with VL <20 fairly consistently in 2016 period, then changed from [place] to [facility] for simplification of her regimen with reduction of pill burden and also giving her an integrase strand transfer inhibitor with higher barrier to resistance.  The patient has told us that she done developed a rash while on TIVICAY.  Certainly I cannot find documentation of her having developed a rash when she was switched to [facility] and she remained undetectable through several visits on this regimen of TIVICAY once daily Emtricitabine  and boosted darunavir ritonavir twice daily.  She is subsequently transferred care [facility] and she is largely been viremic although she did get a viral load down to 653 and November 29 of 2021.  There was a very nice note written at [facility] which Dr. Gale Journey had put into the chart which summarize all of the resistance mutations over time that the patient had documented since 2007.  I have put those mutations back into [employer] and the results are below.      Drug resistance interpretation: PR  HIVDB 9.0 (2019-10-17) PI Major Resistance Mutations:  M46I, I54V, I84V, L90IMP  PI Accessory Resistance Mutations : G73S Other Mutations: L10I, L63P, A71V, V77I  Protease Inhibitors atazanavir/r (ATV/r)    High-Level Resistance darunavir/r (DRV/r)     Low-Level Resistance lopinavir/r (LPV/r)         High-Level Resistance   PR comments PI Major M46I/L are relatively non-polymorphic PI-selected mutations. In combination with other PI-resistance mutations, they are associated with reduced susceptibility to each of the PIs except DRV. I54V is a non-polymorphic PI-selected mutation that contributes reduced susceptibility to each of the PIs except DRV. I84V is a non-polymorphic mutation selected by each of the PIs. It causes high-level resistance to ATV, FPV, IDV, NFV and SQV, intermediate resistance to LPV and TPV, and low-level resistance to DRV. L90M is a non-polymorphic PI-selected mutation that reduces susceptibility to each of the PIs except TPV and DRV. PI Accessory G73S/T/C/A are non-polymorphic accessory PI-selected mutations. They are associated primarily with reduced susceptibility to ATV, SQV, FPV, IDV, and NFV. G73V/D are rare non-polymorphic PI-selected mutations. Other L10I/V are polymorphic, PI-selected accessory mutations that increase the replication of viruses with other PI-resistance mutations. A71V/T are polymorphic, PI-selected accessory mutations that increase the replication of viruses with other PI-resistance mutations. L90M is a non-polymorphic PI-selected mutation which reduces susceptibility to each of the PIs except TPV and DRV. L90IP is a highly unusual mutation at this position. Dosage Considerations There is evidence for low-level DRV resistance. If DRV is administered it should be used twice daily.  Mutation scoring: PR HIVDB 9.0 (2019-10-17) PI         ATV/r   DRV/r  LPV/r M46I    10        0          10 M46I + I84V + L90IMP            5  0          5 M46I + L90IMP           10        0          0 I54V     15        0          15 I54V + L90IMP            10        0          5 G73S   10        0          5 G73S + L90IMP          10        0          0 I84V     60        15        30 L90IMP            25        0          15 Total    155       15        85  Drug resistance interpretation: RT HIVDB 9.0 (2019-10-17)  NRTI Resistance Mutations:  M41L, M184V, L210W, T215Y NNRTI Resistance Mutations: K103N, Y181C, G190A  Other Mutations: None   Nucleoside Reverse Transcriptase Inhibitors abacavir (ABC)           High-Level Resistance zidovudine (AZT)        High-Level Resistance emtricitabine (FTC)     High-Level Resistance lamivudine (3TC)        High-Level Resistance tenofovir (TDF)            Intermediate Resistance Non-nucleoside Reverse Transcriptase Inhibitors doravirine (DOR)         Intermediate Resistance efavirenz (EFV)           High-Level Resistance etravirine (ETR)          Intermediate Resistance nevirapine (NVP)        High-Level Resistance rilpivirine (RPV)           High-Level Resistance   RT comments NRTI M41L is a TAM that usually occurs with T215Y. In combination, M41L plus T215Y confer intermediate / high-level resistance to AZT and d4T and contribute to reduced ddI, ABC and TDF susceptibility. M184V/I cause high-level in vitro resistance to 3TC and FTC and low-level resistance to ddI and ABC. However, M184V/I are not contraindications to continued treatment with 3TC or FTC because they increase susceptibility to AZT, TDF and d4T and are associated with clinically significant reductions in HIV-1 replication. L210W is a TAM that usually occurs in combination with M41L and T215Y. The combination of M41, L210W and T215Y causes high-level resistance to AZT and d4T and intermediate to high-level resistance to ddI, ABC and TDF. T215Y is a TAM that causes intermediate/high-level resistance to AZT and d4T, low-level resistance to ddI, and potentially low-level resistance to ABC and TDF. NNRTI K103N is a non-polymorphic mutation that causes high-level reductions in NVP and EFV susceptibility. Y181C is a non-polymorphic mutation selected in patients receiving NVP, ETR and RPV. It reduces susceptibility to  NVP, ETR, RPV, and EFV by >50-fold, 5-fold, 3-fold, and 2-fold, respectively. Although Y181C itself reduces EFV susceptibility by only 2-fold, it has been associated with a reduced response to an EFV-containing regimen in NNRTI-experienced  patients. Y181C has a weight of 2.5 in the [employer] ETR genotypic susceptibility score. Alone, it does not appear to reduce DOR susceptibility. G190A is a non-polymorphic mutation that causes high-level resistance to NVP and intermediate resistance to EFV. It has a weight of 1.0 in the [employer] ETR genotypic susceptibility score but does not appear to be selected by ETR or RPV or to reduce their in vitro susceptibility in the absence of other NNRTI-resistance mutations. It also does not appear to reduce DOR susceptibility. Mutation scoring: RT HIVDB 9.0 (2019-10-17) NRTI    ABC     AZT     FTC     3TC     TDF M41L   5          15        0          0          5 M41L + L210W           10        10        0          0          10 M41L + L210W + T215Y        5          0          5          5          5  M41L + T215Y            15        10        5          5          10  M184V 15        -10       60        60        -10 L210W 5          15        0          0          5 L210W + T215Y          10        10        5          5          10  T215Y  10        40        0          0          10 Total    75        90        75        75        45 NNRTI DOR    EFV     ETR     NVP     RPV K103N + Y181C          10        0          0          0          0 Y181C 10        30  30        60        45 Y181C + G190A          20        0          10        0          10 K103N 0          60        0          60        0 G190A 0          45        10        60        15 Total    40        135      50        180      70   She appears to frequently only take the boosted protease inhibitor not take the [facility] because she is concerned that is causing the rash that she has.   Dr. Gearldine Shown has been skeptical of this and I am as well.  She was scheduled for a biopsy with dermatology in April. (though I neglected to ask about this today)  She was scheduled with me to consider different regimens and options for her to try to improve her adherence.  She was accompanied by her daughter to clinic  They again detailed the issues she has with esophageal stricture.  She does crush both the Prezista and the Norvir but is able to take the [facility] without difficulty.  I checked with Patrick Jupiter D and he had informed me that Norvir should not be crushed due to it solubility issues.  Endeavored to procure powdered no Norvir and I sent prescriptions to [facility] for this along with a prescription for DESCOVY which I thought she really needed added to her regimen.  It turns out that scripts were then converted over to her pharmacy that she prefers and she came to clinic with medications from there.  However the that pharmacy also does not have powdered NORVIR the prescription is written as if it is powder NORVIR that can be dissolved but clearly on examining the tablets they are not powdered nor dissolvable.  I had extensive discussion with Laura Jimenez our pharmacy tech and it looks as if [facility] cannot procure NORVIR powdered packs either and she is try to find a pharmacy that can from seller.  The patient in the interim has been taking her medications with crushed Prezista twice daily along with crushed Norvir twice daily TIVICAY once daily and DESCOVY once daily.  When I saw her in follow-up she had dropped her viral load nicely  She says that she is having a little bit of difficulty getting used to taking the DESCOVY but she believes she is adjusting to taking it.  It should not really be causing much side effects for her frankly but I am really happy that she is taking her antivirals.  Lab Results  Component Value Date   HIV1RNAQUANT 95 (H) 11/21/2020    HIV1RNAQUANT 33,000 (H) 10/17/2020   HIV1RNAQUANT 653 (H) 07/23/2020         Past Medical History:  Diagnosis Date  . Anxiety   . Arthritis   . Cancer ([place])   . CVA (cerebral infarction)   .  GERD (gastroesophageal reflux disease)   . HIV infection ([place])   . HTN (hypertension)   . Hyperlipidemia   . Morbid obesity with BMI of 40.0-44.9, adult ([place])   . Postmenopausal bleeding   . Stroke [facility])    no residual paralysis    Past Surgical History:  Procedure Laterality Date  . COLONOSCOPY  2000  . HYSTEROSCOPY WITH D & C N/A 04/19/2013   Procedure: DILATATION AND CURETTAGE /HYSTEROSCOPY;  Surgeon: Emily Filbert, MD;  Location: [facility];  Service: Gynecology;  Laterality: N/A;  . LYMPH NODE BIOPSY Right 09/17/2020   Procedure: LYMPH NODE BIOPSY; INGUINAL;  Surgeon: Virl Cagey, MD;  Location: [facility];  Service: General;  Laterality: Right;    Family History  Problem Relation Age of Onset  . Hypertension Mother   . Hypertension Sister   . Arthritis Sister   . Cancer Other   . Diabetes Other       Social History   Socioeconomic History  . Marital status: Widowed    Spouse name: Not on file  . Number of children: Not on file  . Years of education: Not on file  . Highest education level: 10th grade  Occupational History  . Not on file  Tobacco Use  . Smoking status: Former Smoker    Packs/day: 1.00    Years: 5.00    Pack years: 5.00    Types: Cigarettes  . Smokeless tobacco: Never Used  Vaping Use  . Vaping Use: Never used  Substance and Sexual Activity  . Alcohol use: No  . Drug use: No  . Sexual activity: Not Currently    Birth control/protection: None, Post-menopausal  Other Topics Concern  . Not on file  Social History Narrative   Grew up in [place], [place].   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   2 children, boy and girl. Son is 62s, and special needs.   Daughter in her 30s, is a Marine scientist.   Eats all food groups.   Wears seatbelt.    Attends church.   Enjoys watching tv, listening to [employer], and spending time with friends.   Enjoys going out to eat.    Social Determinants of Health   Financial Resource Strain: Low Risk   . Difficulty of Paying Living Expenses: Not hard at all  Food Insecurity: No Food Insecurity  . Worried About Charity fundraiser in the Last Year: Never true  . Ran Out of Food in the Last Year: Never true  Transportation Needs: No Transportation Needs  . Lack of Transportation (Medical): No  . Lack of Transportation (Non-Medical): No  Physical Activity: Sufficiently Active  . Days of Exercise per Week: 7 days  . Minutes of Exercise per Session: 60 min  Stress: Stress Concern Present  . Feeling of Stress : To some extent  Social Connections: Moderately Isolated  . Frequency of Communication with Friends and Family: More than three times a week  . Frequency of Social Gatherings with Friends and Family: Once a week  . Attends Religious Services: More than 4 times per year  . Active Member of Clubs or Organizations: No  . Attends Archivist Meetings: Never  . Marital Status: Widowed    Allergies  Allergen Reactions  . Gadolinium Derivatives Nausea And Vomiting    Vomited with multihance 12/05/19  . Amlodipine Other (See Comments)  . Ivp Dye [Iodinated Diagnostic Agents] Other (See Comments)    Caused kidney damage  .  Penicillins Swelling  . Tape Dermatitis  . Cephalexin Rash  . Latex Rash     Current Outpatient Medications:  .  acetaminophen (TYLENOL) 325 MG tablet, , Disp: , Rfl:  .  aspirin (PX ENTERIC ASPIRIN) 81 MG EC tablet, Take 1 tablet (81 mg total) by mouth daily. Swallow whole., Disp: 30 tablet, Rfl: 12 .  darunavir (PREZISTA) 600 MG tablet, TAKE 1 TABLET (600 MG TOTAL) BY MOUTH 2 (TWO) TIMES DAILY WITH A MEAL. CRUSH AND TAKE BID WITH NORVIR POWDER PACKETS BID, Disp: 60 tablet, Rfl: 11 .  diazepam (VALIUM) 2 MG tablet, Take 2 mg by mouth daily as needed for  anxiety., Disp: , Rfl:  .  dolutegravir (TIVICAY) 50 MG tablet, TAKE 1 TABLET (50 MG TOTAL) BY MOUTH DAILY., Disp: 30 tablet, Rfl: 11 .  DULoxetine (CYMBALTA) 60 MG capsule, Take 60 mg by mouth daily., Disp: , Rfl:  .  emtricitabine-tenofovir AF (DESCOVY) 200-25 MG tablet, TAKE 1 TABLET BY MOUTH DAILY., Disp: 30 tablet, Rfl: 4 .  ibuprofen (ADVIL) 200 MG tablet, Take 200 mg by mouth every 8 (eight) hours as needed for moderate pain., Disp: , Rfl:  .  metoprolol succinate (TOPROL-XL) 25 MG 24 hr tablet, Take 25 mg by mouth at bedtime., Disp: , Rfl:  .  olmesartan (BENICAR) 20 MG tablet, Take 20 mg by mouth daily., Disp: , Rfl:  .  oxyCODONE (ROXICODONE) 5 MG immediate release tablet, Take 1 tablet (5 mg total) by mouth every 4 (four) hours as needed for severe pain or breakthrough pain., Disp: 10 tablet, Rfl: 0 .  pantoprazole (PROTONIX) 40 MG tablet, Take 40 mg by mouth daily., Disp: , Rfl:  .  Ritonavir 100 MG PACK, TAKE 100 MG BY MOUTH IN THE MORNING AND AT BEDTIME. THIS POWDER CAN BE MIXED IN A SOLUTION, Disp: 60 each, Rfl: 11 .  tetrahydrozoline 0.05 % ophthalmic solution, Place 1 drop into both eyes daily as needed (red eyes)., Disp: , Rfl:  .  triamterene-hydrochlorothiazide (MAXZIDE-25) 37.5-25 MG tablet, Take 1 tablet by mouth daily., Disp: , Rfl:  .  vitamin C (ASCORBIC ACID) 500 MG tablet, Take 500 mg by mouth daily., Disp: , Rfl:  .  Vitamin D, Ergocalciferol, (DRISDOL) 1.25 MG (50000 UNIT) CAPS capsule, Take 50,000 Units by mouth every 7 (seven) days., Disp: , Rfl:     Review of Systems  Constitutional: Negative for activity change, appetite change, chills, diaphoresis, fatigue, fever and unexpected weight change.  HENT: Negative for congestion, rhinorrhea, sinus pressure, sneezing, sore throat and trouble swallowing.   Eyes: Negative for photophobia and visual disturbance.  Respiratory: Negative for cough, chest tightness, shortness of breath, wheezing and stridor.    Cardiovascular: Negative for chest pain, palpitations and leg swelling.  Gastrointestinal: Positive for nausea. Negative for abdominal distention, abdominal pain, anal bleeding, blood in stool, constipation, diarrhea and vomiting.  Genitourinary: Negative for difficulty urinating, dysuria, flank pain and hematuria.  Musculoskeletal: Negative for arthralgias, back pain, gait problem, joint swelling and myalgias.  Skin: Negative for color change, pallor, rash and wound.  Neurological: Negative for dizziness, tremors, weakness and light-headedness.  Hematological: Negative for adenopathy. Does not bruise/bleed easily.  Psychiatric/Behavioral: Negative for agitation, behavioral problems, confusion, decreased concentration, dysphoric mood and sleep disturbance.       Objective:   Physical Exam Constitutional:      General: She is not in acute distress.    Appearance: Normal appearance. She is well-developed. She is not ill-appearing or diaphoretic.  HENT:  Head: Normocephalic and atraumatic.     Right Ear: Hearing and external ear normal.     Left Ear: Hearing and external ear normal.     Nose: No nasal deformity or rhinorrhea.  Eyes:     General: No scleral icterus.    Conjunctiva/sclera: Conjunctivae normal.     Right eye: Right conjunctiva is not injected.     Left eye: Left conjunctiva is not injected.     Pupils: Pupils are equal, round, and reactive to light.  Neck:     Vascular: No JVD.  Cardiovascular:     Rate and Rhythm: Normal rate and regular rhythm.     Heart sounds: Normal heart sounds, S1 normal and S2 normal. No murmur heard. No friction rub.  Abdominal:     General: Bowel sounds are normal. There is no distension.     Palpations: Abdomen is soft.     Tenderness: There is no abdominal tenderness.  Musculoskeletal:        General: Normal range of motion.     Right shoulder: Normal.     Left shoulder: Normal.     Cervical back: Normal range of motion and neck  supple.     Right hip: Normal.     Left hip: Normal.     Right knee: Normal.     Left knee: Normal.  Lymphadenopathy:     Head:     Right side of head: No submandibular, preauricular or posterior auricular adenopathy.     Left side of head: No submandibular, preauricular or posterior auricular adenopathy.     Cervical: No cervical adenopathy.     Right cervical: No superficial or deep cervical adenopathy.    Left cervical: No superficial or deep cervical adenopathy.  Skin:    General: Skin is warm and dry.     Coloration: Skin is not jaundiced or pale.     Findings: No abrasion, bruising, ecchymosis, erythema, lesion or rash.     Nails: There is no clubbing.  Neurological:     General: No focal deficit present.     Mental Status: She is alert and oriented to person, place, and time. Mental status is at baseline.     Sensory: No sensory deficit.     Coordination: Coordination normal.     Gait: Gait normal.  Psychiatric:        Attention and Perception: She is attentive.        Mood and Affect: Mood normal.        Speech: Speech normal.        Behavior: Behavior normal. Behavior is cooperative.        Thought Content: Thought content normal.        Judgment: Judgment normal.           Assessment & Plan:  Multi-drug-resistant HIV problems with tolerating different drugs.  Continue current regimen of Prezista 600 with 100 mg of Norvir twice daily, TIVICAY once daily and DESCOVY once daily.  Check labs today and have her come back to see me in 2 months time.  Esophageal strictures: She has had need for esophageal dilations in the past  COVID prevention counseled her to get a fourth dose of COVID-19 vaccine apparently she had a Bell's palsy with her first dose of [place] but she did well with her second and third doses other than some myalgias.  Is however wanting to do Moderna for her next dose in status she did not like the myalgias  she had with the Pfizer vaccine  I spent  greater than 30  minutes with the patient including greater than 50% of time in face to face counsel of the patient and in coordination of her care.

## 2021-01-06 LAB — URINE CYTOLOGY ANCILLARY ONLY
Chlamydia: NEGATIVE
Comment: NEGATIVE
Comment: NORMAL
Neisseria Gonorrhea: NEGATIVE

## 2021-01-07 LAB — T-HELPER CELLS (CD4) COUNT (NOT AT ARMC)
Absolute CD4: 332 cells/uL — ABNORMAL LOW (ref 490–1740)
CD4 T Helper %: 14 % — ABNORMAL LOW (ref 30–61)
Total lymphocyte count: 2349 cells/uL (ref 850–3900)

## 2021-01-07 LAB — COMPLETE METABOLIC PANEL WITH GFR
AG Ratio: 0.8 (calc) — ABNORMAL LOW (ref 1.0–2.5)
ALT: 13 U/L (ref 6–29)
AST: 19 U/L (ref 10–35)
Albumin: 3.7 g/dL (ref 3.6–5.1)
Alkaline phosphatase (APISO): 72 U/L (ref 37–153)
BUN: 13 mg/dL (ref 7–25)
CO2: 30 mmol/L (ref 20–32)
Calcium: 9.3 mg/dL (ref 8.6–10.4)
Chloride: 105 mmol/L (ref 98–110)
Creat: 0.92 mg/dL (ref 0.50–0.99)
GFR, Est African American: 75 mL/min/{1.73_m2} (ref >=60)
GFR, Est Non African American: 64 mL/min/{1.73_m2} (ref >=60)
Globulin: 4.4 g/dL (calc) — ABNORMAL HIGH (ref 1.9–3.7)
Glucose, Bld: 131 mg/dL — ABNORMAL HIGH (ref 65–99)
Potassium: 3.9 mmol/L (ref 3.5–5.3)
Sodium: 140 mmol/L (ref 135–146)
Total Bilirubin: 0.5 mg/dL (ref 0.2–1.2)
Total Protein: 8.1 g/dL (ref 6.1–8.1)

## 2021-01-07 LAB — CBC WITH DIFFERENTIAL/PLATELET
Absolute Monocytes: 312 cells/uL (ref 200–950)
Basophils Absolute: 40 cells/uL (ref 0–200)
Basophils Relative: 0.9 %
Eosinophils Absolute: 62 cells/uL (ref 15–500)
Eosinophils Relative: 1.4 %
HCT: 35.1 % (ref 35.0–45.0)
Hemoglobin: 11.5 g/dL — ABNORMAL LOW (ref 11.7–15.5)
Lymphs Abs: 2446 cells/uL (ref 850–3900)
MCH: 30.8 pg (ref 27.0–33.0)
MCHC: 32.8 g/dL (ref 32.0–36.0)
MCV: 94.1 fL (ref 80.0–100.0)
MPV: 10.1 fL (ref 7.5–12.5)
Monocytes Relative: 7.1 %
Neutro Abs: 1540 cells/uL (ref 1500–7800)
Neutrophils Relative %: 35 %
Platelets: 189 10*3/uL (ref 140–400)
RBC: 3.73 10*6/uL — ABNORMAL LOW (ref 3.80–5.10)
RDW: 13 % (ref 11.0–15.0)
Total Lymphocyte: 55.6 %
WBC: 4.4 10*3/uL (ref 3.8–10.8)

## 2021-01-07 LAB — HIV RNA, RTPCR W/R GT (RTI, PI,INT)
HIV 1 RNA Quant: 83 {copies}/mL — ABNORMAL HIGH
HIV-1 RNA Quant, Log: 1.92 {Log_copies}/mL — ABNORMAL HIGH

## 2021-01-07 LAB — SYPHILIS: RPR W/REFLEX TO RPR TITER AND TREPONEMAL ANTIBODIES, TRADITIONAL SCREENING AND DIAGNOSIS ALGORITHM: RPR Ser Ql: NONREACTIVE

## 2021-01-29 ENCOUNTER — Other Ambulatory Visit (HOSPITAL_COMMUNITY): Payer: Self-pay

## 2021-01-29 ENCOUNTER — Other Ambulatory Visit (HOSPITAL_BASED_OUTPATIENT_CLINIC_OR_DEPARTMENT_OTHER): Payer: Self-pay

## 2021-02-11 DIAGNOSIS — Z0000 Encounter for general adult medical examination without abnormal findings: Secondary | ICD-10-CM | POA: Diagnosis not present

## 2021-02-11 DIAGNOSIS — Z1231 Encounter for screening mammogram for malignant neoplasm of breast: Secondary | ICD-10-CM | POA: Diagnosis not present

## 2021-02-11 DIAGNOSIS — I10 Essential (primary) hypertension: Secondary | ICD-10-CM | POA: Diagnosis not present

## 2021-02-11 DIAGNOSIS — L7633 Postprocedural seroma of skin and subcutaneous tissue following a dermatologic procedure: Secondary | ICD-10-CM | POA: Diagnosis not present

## 2021-02-11 DIAGNOSIS — E039 Hypothyroidism, unspecified: Secondary | ICD-10-CM | POA: Diagnosis not present

## 2021-02-11 DIAGNOSIS — Z780 Asymptomatic menopausal state: Secondary | ICD-10-CM | POA: Diagnosis not present

## 2021-02-11 DIAGNOSIS — E559 Vitamin D deficiency, unspecified: Secondary | ICD-10-CM | POA: Diagnosis not present

## 2021-02-11 DIAGNOSIS — D649 Anemia, unspecified: Secondary | ICD-10-CM | POA: Diagnosis not present

## 2021-02-11 DIAGNOSIS — E782 Mixed hyperlipidemia: Secondary | ICD-10-CM | POA: Diagnosis not present

## 2021-02-19 ENCOUNTER — Other Ambulatory Visit (HOSPITAL_COMMUNITY): Payer: Self-pay | Admitting: Family Medicine

## 2021-02-19 DIAGNOSIS — Z1231 Encounter for screening mammogram for malignant neoplasm of breast: Secondary | ICD-10-CM

## 2021-02-19 DIAGNOSIS — Z780 Asymptomatic menopausal state: Secondary | ICD-10-CM

## 2021-03-06 ENCOUNTER — Ambulatory Visit (HOSPITAL_COMMUNITY)
Admission: RE | Admit: 2021-03-06 | Discharge: 2021-03-06 | Disposition: A | Discharge status: Home / Self Care | Payer: Medicare Other | Source: Ambulatory Visit | Attending: Family Medicine | Admitting: Family Medicine

## 2021-03-06 ENCOUNTER — Other Ambulatory Visit: Payer: Self-pay

## 2021-03-06 DIAGNOSIS — Z1231 Encounter for screening mammogram for malignant neoplasm of breast: Secondary | ICD-10-CM

## 2021-03-06 DIAGNOSIS — Z780 Asymptomatic menopausal state: Secondary | ICD-10-CM | POA: Insufficient documentation

## 2021-03-06 DIAGNOSIS — Z13820 Encounter for screening for osteoporosis: Secondary | ICD-10-CM | POA: Diagnosis not present

## 2021-03-06 DIAGNOSIS — M8589 Other specified disorders of bone density and structure, multiple sites: Secondary | ICD-10-CM | POA: Insufficient documentation

## 2021-03-19 DIAGNOSIS — E039 Hypothyroidism, unspecified: Secondary | ICD-10-CM | POA: Diagnosis not present

## 2021-03-19 DIAGNOSIS — I10 Essential (primary) hypertension: Secondary | ICD-10-CM | POA: Diagnosis not present

## 2021-03-19 DIAGNOSIS — L7633 Postprocedural seroma of skin and subcutaneous tissue following a dermatologic procedure: Secondary | ICD-10-CM | POA: Diagnosis not present

## 2021-03-19 DIAGNOSIS — R1909 Other intra-abdominal and pelvic swelling, mass and lump: Secondary | ICD-10-CM | POA: Diagnosis not present

## 2021-03-19 DIAGNOSIS — E782 Mixed hyperlipidemia: Secondary | ICD-10-CM | POA: Diagnosis not present

## 2021-03-25 ENCOUNTER — Encounter: Payer: Self-pay | Admitting: Infectious Disease

## 2021-03-25 ENCOUNTER — Ambulatory Visit (INDEPENDENT_AMBULATORY_CARE_PROVIDER_SITE_OTHER): Payer: Medicare Other | Admitting: Infectious Disease

## 2021-03-25 ENCOUNTER — Other Ambulatory Visit: Payer: Self-pay

## 2021-03-25 ENCOUNTER — Ambulatory Visit: Payer: Medicare Other

## 2021-03-25 VITALS — Temp 97.9°F | Wt 250.0 lb

## 2021-03-25 DIAGNOSIS — N939 Abnormal uterine and vaginal bleeding, unspecified: Secondary | ICD-10-CM

## 2021-03-25 DIAGNOSIS — R87619 Unspecified abnormal cytological findings in specimens from cervix uteri: Secondary | ICD-10-CM

## 2021-03-25 DIAGNOSIS — Z79899 Other long term (current) drug therapy: Secondary | ICD-10-CM | POA: Diagnosis not present

## 2021-03-25 DIAGNOSIS — B20 Human immunodeficiency virus [HIV] disease: Secondary | ICD-10-CM

## 2021-03-25 DIAGNOSIS — I10 Essential (primary) hypertension: Secondary | ICD-10-CM | POA: Diagnosis not present

## 2021-03-25 DIAGNOSIS — L7634 Postprocedural seroma of skin and subcutaneous tissue following other procedure: Secondary | ICD-10-CM | POA: Diagnosis not present

## 2021-03-25 MED ORDER — EMTRICITABINE-TENOFOVIR AF 200-25 MG PO TABS: 1.0000 | ORAL_TABLET | Freq: Every day | ORAL | 11 refills | Status: DC

## 2021-03-25 MED ORDER — RITONAVIR 100 MG PO TABS: 100.0000 mg | ORAL_TABLET | Freq: Two times a day (BID) | ORAL | 11 refills | Status: DC

## 2021-03-25 MED ORDER — DOLUTEGRAVIR SODIUM 50 MG PO TABS: ORAL_TABLET | Freq: Every day | ORAL | 11 refills | Status: DC

## 2021-03-25 MED ORDER — DARUNAVIR 600 MG PO TABS: 600.0000 mg | ORAL_TABLET | Freq: Two times a day (BID) | ORAL | 11 refills | Status: DC

## 2021-03-25 NOTE — Progress Notes (Signed)
Subjective:  Chief complaint: Follow-up for HIV disease on medications.   Patient ID: Laura Jimenez, female    DOB: [birth year], 70 y.o.   MRN: [id number]  HPI   Laura Jimenez is a 70 year-old African-American lady diagnosed with HIV in the 1990s who has a history of multidrug-resistant HIV and intermittent adherence to medications.   While she was at [facility] she was on a regimen of twice daily boosted darunavir (prezista) with norvir along with emtricitabine and isentress BID and with VL <20 fairly consistently in 2016 period, then changed from [place] to [facility] for simplification of her regimen with reduction of pill burden and also giving her an integrase strand transfer inhibitor with higher barrier to resistance.   The patient has told us that she done developed a rash while on TIVICAY.  Certainly I cannot find documentation of her having developed a rash when she was switched to [facility] and she remained undetectable through several visits on this regimen of TIVICAY once daily Emtricitabine  and boosted darunavir ritonavir twice daily.   She is subsequently transferred care [facility] and she is largely been viremic although she did get a viral load down to 653 and November 29 of 2021.   There was a very nice note written at [facility] which Dr. Gale Journey had put into the chart which summarize all of the resistance mutations over time that the patient had documented since 2007.   I put those mutations back into [employer] and the results are below.           Drug resistance interpretation: PR   HIVDB 9.0 (2019-10-17) PI Major Resistance Mutations:   M46I, I54V, I84V, L90IMP   PI Accessory Resistance Mutations : G73S Other Mutations: L10I, L63P, A71V, V77I   Protease Inhibitors atazanavir/r (ATV/r)    High-Level Resistance darunavir/r (DRV/r)     Low-Level Resistance lopinavir/r (LPV/r)         High-Level Resistance     PR comments PI Major M46I/L are relatively non-polymorphic PI-selected mutations. In combination with other PI-resistance mutations, they are associated with reduced susceptibility to each of the PIs except DRV. I54V is a non-polymorphic PI-selected mutation that contributes reduced susceptibility to each of the PIs except DRV. I84V is a non-polymorphic mutation selected by each of the PIs. It causes high-level resistance to ATV, FPV, IDV, NFV and SQV, intermediate resistance to LPV and TPV, and low-level resistance to DRV. L90M is a non-polymorphic PI-selected mutation that reduces susceptibility to each of the PIs except TPV and DRV. PI Accessory G73S/T/C/A are non-polymorphic accessory PI-selected mutations. They are associated primarily with reduced susceptibility to ATV, SQV, FPV, IDV, and NFV. G73V/D are rare non-polymorphic PI-selected mutations. Other L10I/V are polymorphic, PI-selected accessory mutations that increase the replication of viruses with other PI-resistance mutations. A71V/T are polymorphic, PI-selected accessory mutations that increase the replication of viruses with other PI-resistance mutations. L90M is a non-polymorphic PI-selected mutation which reduces susceptibility to each of the PIs except TPV and DRV. L90IP is a highly unusual mutation at this position. Dosage Considerations There is evidence for low-level DRV resistance. If DRV is administered it should be used twice daily.   Mutation scoring: PR HIVDB 9.0 (2019-10-17) PI         ATV/r   DRV/r  LPV/r M46I    10        0          10  M46I + I84V + L90IMP            5          0          5 M46I + L90IMP           10        0          0 I54V     15        0          15 I54V + L90IMP            10        0          5 G73S   10        0          5 G73S + L90IMP          10        0          0 I84V     60        15        30 L90IMP            25        0          15 Total    155       15        85   Drug resistance interpretation: RT HIVDB 9.0 (2019-10-17)   NRTI Resistance Mutations:   M41L, M184V, L210W, T215Y NNRTI Resistance Mutations: K103N, Y181C, G190A   Other Mutations: None     Nucleoside Reverse Transcriptase Inhibitors abacavir (ABC)           High-Level Resistance zidovudine (AZT)        High-Level Resistance emtricitabine (FTC)     High-Level Resistance lamivudine (3TC)        High-Level Resistance tenofovir (TDF)            Intermediate Resistance Non-nucleoside Reverse Transcriptase Inhibitors doravirine (DOR)         Intermediate Resistance efavirenz (EFV)           High-Level Resistance etravirine (ETR)          Intermediate Resistance nevirapine (NVP)        High-Level Resistance rilpivirine (RPV)           High-Level Resistance     RT comments NRTI M41L is a TAM that usually occurs with T215Y. In combination, M41L plus T215Y confer intermediate / high-level resistance to AZT and d4T and contribute to reduced ddI, ABC and TDF susceptibility. M184V/I cause high-level in vitro resistance to 3TC and FTC and low-level resistance to ddI and ABC. However, M184V/I are not contraindications to continued treatment with 3TC or FTC because they increase susceptibility to AZT, TDF and d4T and are associated with clinically significant reductions in HIV-1 replication. L210W is a TAM that usually occurs in combination with M41L and T215Y. The combination of M41, L210W and T215Y causes high-level resistance to AZT and d4T and intermediate to high-level resistance to ddI, ABC and TDF. T215Y is a TAM that causes intermediate/high-level resistance to AZT and d4T, low-level resistance to ddI, and potentially low-level resistance to ABC and TDF. NNRTI K103N is a non-polymorphic mutation that causes high-level reductions in NVP and EFV susceptibility. Y181C is a non-polymorphic mutation selected in patients receiving NVP, ETR and RPV. It reduces susceptibility to  NVP,  ETR, RPV, and EFV by >50-fold, 5-fold, 3-fold, and 2-fold, respectively. Although Y181C itself reduces EFV susceptibility by only 2-fold, it has been associated with a reduced response to an EFV-containing regimen in NNRTI-experienced patients. Y181C has a weight of 2.5 in the [employer] ETR genotypic susceptibility score. Alone, it does not appear to reduce DOR susceptibility. G190A is a non-polymorphic mutation that causes high-level resistance to NVP and intermediate resistance to EFV. It has a weight of 1.0 in the [employer] ETR genotypic susceptibility score but does not appear to be selected by ETR or RPV or to reduce their in vitro susceptibility in the absence of other NNRTI-resistance mutations. It also does not appear to reduce DOR susceptibility. Mutation scoring: RT HIVDB 9.0 (2019-10-17) NRTI    ABC     AZT     FTC     3TC     TDF M41L   5          15        0          0          5 M41L + L210W           10        10        0          0          10 M41L + L210W + T215Y        5          0          '5          5          5 '$ M41L + T215Y            '15        10        5          5          10 '$ M184V 15        -10       60        60        -10 L210W 5          15        0          0          5 L210W + T215Y          '10        10        5          5          10 '$ T215Y  10        40        0          0          10 Total    75        90        75        75        45 NNRTI DOR    EFV     ETR     NVP     RPV K103N + Y181C          10        0          0  0          0 Y181C '10        30        30        '$ 60        45 Y181C + G190A          20        0          10        0          10 K103N 0          60        0          60        0 G190A 0          45        10        60        15 Total    40        135      50        180      70     She appears to frequently only take the boosted protease inhibitor not take the [facility] because she is concerned that is causing the rash that she has.   Dr. Gearldine Shown has been skeptical of this and I am as well.   She was scheduled for a biopsy with dermatology in April. (though I neglected to ask about this today)   She was scheduled with me to consider different regimens and options for her to try to improve her adherence.   She was accompanied by her daughter to clinic  They again detailed the issues she has with esophageal stricture.  She does crush both the Prezista and the Norvir but is able to take the [facility] without difficulty.   I checked with Patrick Jupiter D and he had informed me that Norvir should not be crushed due to it solubility issues.  Endeavored to procure powdered no Norvir and I sent prescriptions to [facility] for this along with a prescription for DESCOVY which I thought she really needed added to her regimen.  It turns out that scripts were then converted over to her pharmacy that she prefers and she came to clinic with medications from there.  However the that pharmacy also did not have powdered NORVIR the prescription is written as if it is powder NORVIR that can be dissolved but clearly on examining the tablets they are not powdered nor dissolvable.  I had extensive discussion with Butch Penny our pharmacy tech and it looks as if [facility] cannot procure NORVIR powdered packs and we could not obtain it.  The patient in the interim has been taking her medications with crushed Prezista twice daily along with crushed Norvir twice daily TIVICAY once daily and DESCOVY once daily.  When I saw her in follow-up she had dropped her viral load nicely   Lab Results  Component Value Date   HIV1RNAQUANT 83 (H) 01/04/2021   HIV1RNAQUANT 95 (H) 11/21/2020   HIV1RNAQUANT 33,000 (H) 10/17/2020   He is now quite used to all of her medications including DESCOVY was added to her regimen.  She presents to clinic today with her daughter.         Past Medical History:  Diagnosis Date   Anxiety    Arthritis    Cancer  ([place])  CVA (cerebral infarction)    GERD (gastroesophageal reflux disease)    HIV infection (HCC)    HTN (hypertension)    Hyperlipidemia    Morbid obesity with BMI of 40.0-44.9, adult (HCC)    Postmenopausal bleeding    Stroke [facility])    no residual paralysis    Past Surgical History:  Procedure Laterality Date   COLONOSCOPY  2000   HYSTEROSCOPY WITH D & C N/A 04/19/2013   Procedure: DILATATION AND CURETTAGE /HYSTEROSCOPY;  Surgeon: Emily Filbert, MD;  Location: [facility];  Service: Gynecology;  Laterality: N/A;   LYMPH NODE BIOPSY Right 09/17/2020   Procedure: LYMPH NODE BIOPSY; INGUINAL;  Surgeon: Virl Cagey, MD;  Location: [facility];  Service: General;  Laterality: Right;    Family History  Problem Relation Age of Onset   Hypertension Mother    Hypertension Sister    Arthritis Sister    Cancer Other    Diabetes Other       Social History   Socioeconomic History   Marital status: Widowed    Spouse name: Not on file   Number of children: Not on file   Years of education: Not on file   Highest education level: 10th grade  Occupational History   Not on file  Tobacco Use   Smoking status: Former    Packs/day: 1.00    Years: 5.00    Pack years: 5.00    Types: Cigarettes   Smokeless tobacco: Never  Vaping Use   Vaping Use: Never used  Substance and Sexual Activity   Alcohol use: No   Drug use: No   Sexual activity: Not Currently    Birth control/protection: None, Post-menopausal  Other Topics Concern   Not on file  Social History Narrative   Grew up in [place], [place].   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   2 children, boy and girl. Son is 61s, and special needs.   Daughter in her 42s, is a Marine scientist.   Eats all food groups.   Wears seatbelt.   Attends church.   Enjoys watching tv, listening to [employer], and spending time with friends.   Enjoys going out to eat.    Social Determinants of Health   Financial Resource Strain: Low Risk     Difficulty of Paying Living Expenses: Not hard at all  Food Insecurity: No Food Insecurity   Worried About Charity fundraiser in the Last Year: Never true   [place] in the Last Year: Never true  Transportation Needs: No Transportation Needs   Lack of Transportation (Medical): No   Lack of Transportation (Non-Medical): No  Physical Activity: Sufficiently Active   Days of Exercise per Week: 7 days   Minutes of Exercise per Session: 60 min  Stress: Stress Concern Present   Feeling of Stress : To some extent  Social Connections: Moderately Isolated   Frequency of Communication with Friends and Family: More than three times a week   Frequency of Social Gatherings with Friends and Family: Once a week   Attends Religious Services: More than 4 times per year   Active Member of [employer] or Organizations: No   Attends Archivist Meetings: Never   Marital Status: Widowed    Allergies  Allergen Reactions   Gadolinium Derivatives Nausea And Vomiting    Vomited with multihance 12/05/19 Vomited with multihance 12/05/19   Amlodipine Other (See Comments)   Ivp Dye [Iodinated Diagnostic Agents] Other (  See Comments)    Caused kidney damage   Penicillins Swelling   Tape Dermatitis   Cephalexin Rash   Latex Rash     Current Outpatient Medications:    acetaminophen (TYLENOL) 325 MG tablet, , Disp: , Rfl:    aspirin (PX ENTERIC ASPIRIN) 81 MG EC tablet, Take 1 tablet (81 mg total) by mouth daily. Swallow whole., Disp: 30 tablet, Rfl: 12   atorvastatin (LIPITOR) 10 MG tablet, Take 10 mg by mouth daily., Disp: , Rfl:    darunavir (PREZISTA) 600 MG tablet, TAKE 1 TABLET (600 MG TOTAL) BY MOUTH 2 (TWO) TIMES DAILY WITH A MEAL. CRUSH AND TAKE BID WITH NORVIR POWDER PACKETS BID, Disp: 60 tablet, Rfl: 11   diazepam (VALIUM) 2 MG tablet, Take 2 mg by mouth daily as needed for anxiety., Disp: , Rfl:    dolutegravir (TIVICAY) 50 MG tablet, TAKE 1 TABLET (50 MG TOTAL) BY MOUTH DAILY., Disp: 30  tablet, Rfl: 11   DULoxetine (CYMBALTA) 60 MG capsule, Take 60 mg by mouth daily., Disp: , Rfl:    emtricitabine-tenofovir AF (DESCOVY) 200-25 MG tablet, TAKE 1 TABLET BY MOUTH DAILY., Disp: 30 tablet, Rfl: 4   ibuprofen (ADVIL) 200 MG tablet, Take 200 mg by mouth every 8 (eight) hours as needed for moderate pain., Disp: , Rfl:    metoprolol succinate (TOPROL-XL) 25 MG 24 hr tablet, Take 25 mg by mouth at bedtime., Disp: , Rfl:    olmesartan (BENICAR) 20 MG tablet, Take 40 mg by mouth daily., Disp: , Rfl:    oxyCODONE (ROXICODONE) 5 MG immediate release tablet, Take 1 tablet (5 mg total) by mouth every 4 (four) hours as needed for severe pain or breakthrough pain., Disp: 10 tablet, Rfl: 0   pantoprazole (PROTONIX) 40 MG tablet, Take 40 mg by mouth daily., Disp: , Rfl:    Ritonavir 100 MG PACK, TAKE 100 MG BY MOUTH IN THE MORNING AND AT BEDTIME. THIS POWDER CAN BE MIXED IN A SOLUTION, Disp: 60 each, Rfl: 11   tetrahydrozoline 0.05 % ophthalmic solution, Place 1 drop into both eyes daily as needed (red eyes)., Disp: , Rfl:    triamterene-hydrochlorothiazide (MAXZIDE-25) 37.5-25 MG tablet, Take 1 tablet by mouth daily., Disp: , Rfl:    vitamin C (ASCORBIC ACID) 500 MG tablet, Take 500 mg by mouth daily., Disp: , Rfl:    Vitamin D, Ergocalciferol, (DRISDOL) 1.25 MG (50000 UNIT) CAPS capsule, Take 50,000 Units by mouth every 7 (seven) days., Disp: , Rfl:     Review of Systems  Constitutional:  Negative for chills, diaphoresis and fever.  HENT:  Negative for congestion, hearing loss, sore throat and tinnitus.   Respiratory:  Negative for cough, shortness of breath and wheezing.   Cardiovascular:  Negative for chest pain, palpitations and leg swelling.  Gastrointestinal:  Negative for abdominal pain, blood in stool, constipation, diarrhea, nausea and vomiting.  Genitourinary:  Negative for dysuria, flank pain and hematuria.  Musculoskeletal:  Negative for back pain and myalgias.  Skin:  Negative for  rash.  Neurological:  Negative for dizziness, weakness and headaches.  Hematological:  Does not bruise/bleed easily.  Psychiatric/Behavioral:  Negative for agitation, behavioral problems, confusion, decreased concentration, sleep disturbance and suicidal ideas. The patient is not nervous/anxious and is not hyperactive.       Objective:   Physical Exam Constitutional:      Appearance: She is normal weight. She is not diaphoretic.  HENT:     Head: Normocephalic and atraumatic.  Right Ear: External ear normal.     Left Ear: External ear normal.     Nose: Nose normal.     Mouth/Throat:     Pharynx: No oropharyngeal exudate.  Eyes:     General: No scleral icterus.       Right eye: No discharge.        Left eye: No discharge.     Extraocular Movements: Extraocular movements intact.     Conjunctiva/sclera: Conjunctivae normal.  Cardiovascular:     Rate and Rhythm: Normal rate and regular rhythm.  Pulmonary:     Effort: Pulmonary effort is normal. No respiratory distress.     Breath sounds: No wheezing or rales.  Abdominal:     General: There is no distension.     Palpations: Abdomen is soft.  Musculoskeletal:        General: No swelling or tenderness. Normal range of motion.     Cervical back: Normal range of motion and neck supple.  Lymphadenopathy:     Cervical: No cervical adenopathy.  Skin:    General: Skin is warm.     Coloration: Skin is not jaundiced or pale.     Findings: No bruising or rash.  Neurological:     General: No focal deficit present.     Mental Status: She is alert and oriented to person, place, and time. Mental status is at baseline.     Coordination: Coordination normal.  Psychiatric:        Mood and Affect: Mood normal.        Thought Content: Thought content normal.        Judgment: Judgment normal.          Assessment & Plan:  Multidrug-resistant HIV:  We will recheck viral load with reflex to genotype, CD4 CBC with differential CMP  syphilis test gonorrhea chlamydia.  I have resent in prescriptions of Prezista 600 boosted with 100 mg of Norvir twice daily, TIVICAY once daily and DESCOVY once daily.   Esophageal strictures: These have needed dilatation in the past currently not an issue with her ingesting her antiretrovirals.  Vaginal bleeding: Her daughter and the patient attributed this to blood pressure medications though I suspect instead it was due to blood thinners such as potentially the ibuprofen that she is on intermittently.  Indicates she has been worked up extensively by OB/GYN.  COVID-19 prevention: Has had 3 doses of mRNA based vaccine have counseled her to get another one once the more specific 1 comes up in September that is been more closely match to current circulating strains  I spent 42 minutes with the patient including  face to face counseling of the patient regarding her antiretroviral regimen regarding potential future regimens once newer drugs become available, reviewing her cumulative genotypes from the past, going over her current medication regimen,in  review of medical records in preparation for the visit and during the visit and in coordination of her care.

## 2021-03-26 LAB — T-HELPER CELL (CD4) - (RCID CLINIC ONLY)
CD4 % Helper T Cell: 12 % — ABNORMAL LOW (ref 33–65)
CD4 T Cell Abs: 318 /uL — ABNORMAL LOW (ref 400–1790)

## 2021-03-27 LAB — COMPLETE METABOLIC PANEL WITH GFR
AG Ratio: 0.9 (calc) — ABNORMAL LOW (ref 1.0–2.5)
ALT: 9 U/L (ref 6–29)
AST: 17 U/L (ref 10–35)
Albumin: 4 g/dL (ref 3.6–5.1)
Alkaline phosphatase (APISO): 72 U/L (ref 37–153)
BUN: 11 mg/dL (ref 7–25)
CO2: 28 mmol/L (ref 20–32)
Calcium: 9.3 mg/dL (ref 8.6–10.4)
Chloride: 105 mmol/L (ref 98–110)
Creat: 0.89 mg/dL (ref 0.50–1.05)
Globulin: 4.5 g/dL (calc) — ABNORMAL HIGH (ref 1.9–3.7)
Glucose, Bld: 83 mg/dL (ref 65–99)
Potassium: 3.8 mmol/L (ref 3.5–5.3)
Sodium: 139 mmol/L (ref 135–146)
Total Bilirubin: 0.8 mg/dL (ref 0.2–1.2)
Total Protein: 8.5 g/dL — ABNORMAL HIGH (ref 6.1–8.1)
eGFR: 71 mL/min/{1.73_m2} (ref >=60)

## 2021-03-27 LAB — CBC WITH DIFFERENTIAL/PLATELET
Absolute Monocytes: 350 cells/uL (ref 200–950)
Basophils Absolute: 51 cells/uL (ref 0–200)
Basophils Relative: 1.1 %
Eosinophils Absolute: 78 cells/uL (ref 15–500)
Eosinophils Relative: 1.7 %
HCT: 38.2 % (ref 35.0–45.0)
Hemoglobin: 12.3 g/dL (ref 11.7–15.5)
Lymphs Abs: 2654 cells/uL (ref 850–3900)
MCH: 30.5 pg (ref 27.0–33.0)
MCHC: 32.2 g/dL (ref 32.0–36.0)
MCV: 94.8 fL (ref 80.0–100.0)
MPV: 10.6 fL (ref 7.5–12.5)
Monocytes Relative: 7.6 %
Neutro Abs: 1467 cells/uL — ABNORMAL LOW (ref 1500–7800)
Neutrophils Relative %: 31.9 %
Platelets: 238 10*3/uL (ref 140–400)
RBC: 4.03 10*6/uL (ref 3.80–5.10)
RDW: 12.7 % (ref 11.0–15.0)
Total Lymphocyte: 57.7 %
WBC: 4.6 10*3/uL (ref 3.8–10.8)

## 2021-03-27 LAB — HIV-1 RNA QUANT-NO REFLEX-BLD
HIV 1 RNA Quant: 71 Copies/mL — ABNORMAL HIGH
HIV-1 RNA Quant, Log: 1.85 Log cps/mL — ABNORMAL HIGH

## 2021-03-27 LAB — LIPID PANEL
Cholesterol: 176 mg/dL (ref <200)
HDL: 50 mg/dL (ref >=50)
LDL Cholesterol (Calc): 103 mg/dL (calc) — ABNORMAL HIGH
Non-HDL Cholesterol (Calc): 126 mg/dL (calc) (ref <130)
Total CHOL/HDL Ratio: 3.5 (calc) (ref <5.0)
Triglycerides: 136 mg/dL (ref <150)

## 2021-03-27 LAB — RPR: RPR Ser Ql: NONREACTIVE

## 2021-04-05 DIAGNOSIS — R1909 Other intra-abdominal and pelvic swelling, mass and lump: Secondary | ICD-10-CM | POA: Diagnosis not present

## 2021-04-05 DIAGNOSIS — L7633 Postprocedural seroma of skin and subcutaneous tissue following a dermatologic procedure: Secondary | ICD-10-CM | POA: Diagnosis not present

## 2021-04-23 DIAGNOSIS — Z881 Allergy status to other antibiotic agents status: Secondary | ICD-10-CM | POA: Diagnosis not present

## 2021-04-23 DIAGNOSIS — T8143XA Infection following a procedure, organ and space surgical site, initial encounter: Secondary | ICD-10-CM | POA: Diagnosis not present

## 2021-04-23 DIAGNOSIS — Z880 Allergy status to penicillin: Secondary | ICD-10-CM | POA: Diagnosis not present

## 2021-04-23 DIAGNOSIS — Z888 Allergy status to other drugs, medicaments and biological substances status: Secondary | ICD-10-CM | POA: Diagnosis not present

## 2021-04-23 DIAGNOSIS — I898 Other specified noninfective disorders of lymphatic vessels and lymph nodes: Secondary | ICD-10-CM | POA: Diagnosis not present

## 2021-04-23 DIAGNOSIS — Z91041 Radiographic dye allergy status: Secondary | ICD-10-CM | POA: Diagnosis not present

## 2021-04-25 DIAGNOSIS — R509 Fever, unspecified: Secondary | ICD-10-CM | POA: Diagnosis not present

## 2021-04-28 DIAGNOSIS — Z7982 Long term (current) use of aspirin: Secondary | ICD-10-CM | POA: Diagnosis not present

## 2021-04-28 DIAGNOSIS — Z79899 Other long term (current) drug therapy: Secondary | ICD-10-CM | POA: Diagnosis not present

## 2021-04-28 DIAGNOSIS — Z87891 Personal history of nicotine dependence: Secondary | ICD-10-CM | POA: Diagnosis not present

## 2021-04-28 DIAGNOSIS — K8020 Calculus of gallbladder without cholecystitis without obstruction: Secondary | ICD-10-CM | POA: Diagnosis not present

## 2021-04-28 DIAGNOSIS — Z91041 Radiographic dye allergy status: Secondary | ICD-10-CM | POA: Diagnosis not present

## 2021-04-28 DIAGNOSIS — R7401 Elevation of levels of liver transaminase levels: Secondary | ICD-10-CM | POA: Diagnosis not present

## 2021-04-28 DIAGNOSIS — I10 Essential (primary) hypertension: Secondary | ICD-10-CM | POA: Diagnosis not present

## 2021-04-28 DIAGNOSIS — Z20822 Contact with and (suspected) exposure to covid-19: Secondary | ICD-10-CM | POA: Diagnosis not present

## 2021-04-28 DIAGNOSIS — E0590 Thyrotoxicosis, unspecified without thyrotoxic crisis or storm: Secondary | ICD-10-CM | POA: Diagnosis not present

## 2021-04-28 DIAGNOSIS — R319 Hematuria, unspecified: Secondary | ICD-10-CM | POA: Diagnosis not present

## 2021-04-28 DIAGNOSIS — N3000 Acute cystitis without hematuria: Secondary | ICD-10-CM | POA: Diagnosis not present

## 2021-04-28 DIAGNOSIS — I517 Cardiomegaly: Secondary | ICD-10-CM | POA: Diagnosis not present

## 2021-04-28 DIAGNOSIS — R509 Fever, unspecified: Secondary | ICD-10-CM | POA: Diagnosis not present

## 2021-04-28 DIAGNOSIS — Z888 Allergy status to other drugs, medicaments and biological substances status: Secondary | ICD-10-CM | POA: Diagnosis not present

## 2021-04-28 DIAGNOSIS — E010 Iodine-deficiency related diffuse (endemic) goiter: Secondary | ICD-10-CM | POA: Diagnosis not present

## 2021-04-28 DIAGNOSIS — B9689 Other specified bacterial agents as the cause of diseases classified elsewhere: Secondary | ICD-10-CM | POA: Diagnosis not present

## 2021-04-28 DIAGNOSIS — R918 Other nonspecific abnormal finding of lung field: Secondary | ICD-10-CM | POA: Diagnosis not present

## 2021-04-28 DIAGNOSIS — R519 Headache, unspecified: Secondary | ICD-10-CM | POA: Diagnosis not present

## 2021-04-28 DIAGNOSIS — E0591 Thyrotoxicosis, unspecified with thyrotoxic crisis or storm: Secondary | ICD-10-CM | POA: Diagnosis not present

## 2021-04-28 DIAGNOSIS — E069 Thyroiditis, unspecified: Secondary | ICD-10-CM | POA: Diagnosis not present

## 2021-04-28 DIAGNOSIS — R7989 Other specified abnormal findings of blood chemistry: Secondary | ICD-10-CM | POA: Diagnosis not present

## 2021-04-28 DIAGNOSIS — E0500 Thyrotoxicosis with diffuse goiter without thyrotoxic crisis or storm: Secondary | ICD-10-CM | POA: Diagnosis not present

## 2021-04-28 DIAGNOSIS — Z880 Allergy status to penicillin: Secondary | ICD-10-CM | POA: Diagnosis not present

## 2021-04-28 DIAGNOSIS — K219 Gastro-esophageal reflux disease without esophagitis: Secondary | ICD-10-CM | POA: Diagnosis not present

## 2021-04-29 DIAGNOSIS — K219 Gastro-esophageal reflux disease without esophagitis: Secondary | ICD-10-CM | POA: Diagnosis not present

## 2021-04-29 DIAGNOSIS — I10 Essential (primary) hypertension: Secondary | ICD-10-CM | POA: Diagnosis not present

## 2021-04-29 DIAGNOSIS — R7989 Other specified abnormal findings of blood chemistry: Secondary | ICD-10-CM | POA: Diagnosis not present

## 2021-04-29 DIAGNOSIS — R509 Fever, unspecified: Secondary | ICD-10-CM | POA: Diagnosis not present

## 2021-04-29 DIAGNOSIS — R319 Hematuria, unspecified: Secondary | ICD-10-CM | POA: Diagnosis not present

## 2021-04-29 DIAGNOSIS — E010 Iodine-deficiency related diffuse (endemic) goiter: Secondary | ICD-10-CM | POA: Diagnosis not present

## 2021-04-30 DIAGNOSIS — N3000 Acute cystitis without hematuria: Secondary | ICD-10-CM | POA: Diagnosis not present

## 2021-04-30 DIAGNOSIS — R7401 Elevation of levels of liver transaminase levels: Secondary | ICD-10-CM | POA: Diagnosis not present

## 2021-04-30 DIAGNOSIS — R509 Fever, unspecified: Secondary | ICD-10-CM | POA: Diagnosis not present

## 2021-04-30 DIAGNOSIS — B9689 Other specified bacterial agents as the cause of diseases classified elsewhere: Secondary | ICD-10-CM | POA: Diagnosis not present

## 2021-04-30 DIAGNOSIS — K8020 Calculus of gallbladder without cholecystitis without obstruction: Secondary | ICD-10-CM | POA: Diagnosis not present

## 2021-04-30 DIAGNOSIS — E0590 Thyrotoxicosis, unspecified without thyrotoxic crisis or storm: Secondary | ICD-10-CM | POA: Diagnosis not present

## 2021-05-01 DIAGNOSIS — N3000 Acute cystitis without hematuria: Secondary | ICD-10-CM | POA: Diagnosis not present

## 2021-05-01 DIAGNOSIS — R7401 Elevation of levels of liver transaminase levels: Secondary | ICD-10-CM | POA: Diagnosis not present

## 2021-05-01 DIAGNOSIS — E0591 Thyrotoxicosis, unspecified with thyrotoxic crisis or storm: Secondary | ICD-10-CM | POA: Diagnosis not present

## 2021-05-01 DIAGNOSIS — R509 Fever, unspecified: Secondary | ICD-10-CM | POA: Diagnosis not present

## 2021-05-06 DIAGNOSIS — E060 Acute thyroiditis: Secondary | ICD-10-CM | POA: Diagnosis not present

## 2021-05-06 DIAGNOSIS — D649 Anemia, unspecified: Secondary | ICD-10-CM | POA: Diagnosis not present

## 2021-05-06 DIAGNOSIS — N3090 Cystitis, unspecified without hematuria: Secondary | ICD-10-CM | POA: Diagnosis not present

## 2021-05-06 DIAGNOSIS — R7401 Elevation of levels of liver transaminase levels: Secondary | ICD-10-CM | POA: Diagnosis not present

## 2021-05-06 DIAGNOSIS — M10071 Idiopathic gout, right ankle and foot: Secondary | ICD-10-CM | POA: Diagnosis not present

## 2021-05-06 DIAGNOSIS — R269 Unspecified abnormalities of gait and mobility: Secondary | ICD-10-CM | POA: Diagnosis not present

## 2021-05-06 DIAGNOSIS — I10 Essential (primary) hypertension: Secondary | ICD-10-CM | POA: Diagnosis not present

## 2021-05-06 DIAGNOSIS — R319 Hematuria, unspecified: Secondary | ICD-10-CM | POA: Diagnosis not present

## 2021-05-16 DIAGNOSIS — E878 Other disorders of electrolyte and fluid balance, not elsewhere classified: Secondary | ICD-10-CM | POA: Diagnosis not present

## 2021-05-16 DIAGNOSIS — R779 Abnormality of plasma protein, unspecified: Secondary | ICD-10-CM | POA: Diagnosis not present

## 2021-05-16 DIAGNOSIS — E060 Acute thyroiditis: Secondary | ICD-10-CM | POA: Diagnosis not present

## 2021-05-16 DIAGNOSIS — D649 Anemia, unspecified: Secondary | ICD-10-CM | POA: Diagnosis not present

## 2021-05-21 DIAGNOSIS — I898 Other specified noninfective disorders of lymphatic vessels and lymph nodes: Secondary | ICD-10-CM | POA: Diagnosis not present

## 2021-05-21 DIAGNOSIS — I10 Essential (primary) hypertension: Secondary | ICD-10-CM | POA: Diagnosis not present

## 2021-05-21 DIAGNOSIS — T8189XA Other complications of procedures, not elsewhere classified, initial encounter: Secondary | ICD-10-CM | POA: Diagnosis not present

## 2021-05-22 DIAGNOSIS — M10071 Idiopathic gout, right ankle and foot: Secondary | ICD-10-CM | POA: Diagnosis not present

## 2021-05-22 DIAGNOSIS — E039 Hypothyroidism, unspecified: Secondary | ICD-10-CM | POA: Diagnosis not present

## 2021-05-22 DIAGNOSIS — I10 Essential (primary) hypertension: Secondary | ICD-10-CM | POA: Diagnosis not present

## 2021-05-22 DIAGNOSIS — Z23 Encounter for immunization: Secondary | ICD-10-CM | POA: Diagnosis not present

## 2021-05-22 DIAGNOSIS — D649 Anemia, unspecified: Secondary | ICD-10-CM | POA: Diagnosis not present

## 2021-06-03 DIAGNOSIS — I898 Other specified noninfective disorders of lymphatic vessels and lymph nodes: Secondary | ICD-10-CM | POA: Diagnosis not present

## 2021-06-06 ENCOUNTER — Telehealth: Payer: Self-pay | Admitting: Hematology and Oncology

## 2021-06-06 NOTE — Telephone Encounter (Signed)
Scheduled appt per 10/13 referral. Pt is aware of appt date and time.  

## 2021-06-10 ENCOUNTER — Inpatient Hospital Stay: Payer: Medicare Other

## 2021-06-10 ENCOUNTER — Inpatient Hospital Stay: Payer: Medicare Other | Attending: Hematology and Oncology | Admitting: Hematology and Oncology

## 2021-06-10 ENCOUNTER — Other Ambulatory Visit: Payer: Self-pay

## 2021-06-10 VITALS — BP 158/79 | HR 77 | Temp 98.4°F | Resp 18 | Wt 241.6 lb

## 2021-06-10 DIAGNOSIS — E785 Hyperlipidemia, unspecified: Secondary | ICD-10-CM | POA: Diagnosis not present

## 2021-06-10 DIAGNOSIS — Z21 Asymptomatic human immunodeficiency virus [HIV] infection status: Secondary | ICD-10-CM | POA: Insufficient documentation

## 2021-06-10 DIAGNOSIS — D649 Anemia, unspecified: Secondary | ICD-10-CM

## 2021-06-10 DIAGNOSIS — I10 Essential (primary) hypertension: Secondary | ICD-10-CM | POA: Diagnosis not present

## 2021-06-10 DIAGNOSIS — K219 Gastro-esophageal reflux disease without esophagitis: Secondary | ICD-10-CM | POA: Insufficient documentation

## 2021-06-10 DIAGNOSIS — R591 Generalized enlarged lymph nodes: Secondary | ICD-10-CM

## 2021-06-10 DIAGNOSIS — D472 Monoclonal gammopathy: Secondary | ICD-10-CM | POA: Insufficient documentation

## 2021-06-10 DIAGNOSIS — Z8673 Personal history of transient ischemic attack (TIA), and cerebral infarction without residual deficits: Secondary | ICD-10-CM | POA: Insufficient documentation

## 2021-06-10 LAB — CMP (CANCER CENTER ONLY)
ALT: 13 U/L (ref 0–44)
AST: 20 U/L (ref 15–41)
Albumin: 3.1 g/dL — ABNORMAL LOW (ref 3.5–5.0)
Alkaline Phosphatase: 92 U/L (ref 38–126)
Anion gap: 9 (ref 5–15)
BUN: 11 mg/dL (ref 8–23)
CO2: 27 mmol/L (ref 22–32)
Calcium: 9.4 mg/dL (ref 8.9–10.3)
Chloride: 106 mmol/L (ref 98–111)
Creatinine: 1.23 mg/dL — ABNORMAL HIGH (ref 0.44–1.00)
GFR, Estimated: 48 mL/min — ABNORMAL LOW (ref >60)
Glucose, Bld: 81 mg/dL (ref 70–99)
Potassium: 3.9 mmol/L (ref 3.5–5.1)
Sodium: 142 mmol/L (ref 135–145)
Total Bilirubin: 0.7 mg/dL (ref 0.3–1.2)
Total Protein: 8.3 g/dL — ABNORMAL HIGH (ref 6.5–8.1)

## 2021-06-10 LAB — CBC WITH DIFFERENTIAL (CANCER CENTER ONLY)
Abs Immature Granulocytes: 0.02 10*3/uL (ref 0.00–0.07)
Basophils Absolute: 0.1 10*3/uL (ref 0.0–0.1)
Basophils Relative: 1 %
Eosinophils Absolute: 0.1 10*3/uL (ref 0.0–0.5)
Eosinophils Relative: 1 %
HCT: 31.1 % — ABNORMAL LOW (ref 36.0–46.0)
Hemoglobin: 9.6 g/dL — ABNORMAL LOW (ref 12.0–15.0)
Immature Granulocytes: 0 %
Lymphocytes Relative: 43 %
Lymphs Abs: 3.1 10*3/uL (ref 0.7–4.0)
MCH: 29 pg (ref 26.0–34.0)
MCHC: 30.9 g/dL (ref 30.0–36.0)
MCV: 94 fL (ref 80.0–100.0)
Monocytes Absolute: 0.5 10*3/uL (ref 0.1–1.0)
Monocytes Relative: 7 %
Neutro Abs: 3.5 10*3/uL (ref 1.7–7.7)
Neutrophils Relative %: 48 %
Platelet Count: 304 10*3/uL (ref 150–400)
RBC: 3.31 MIL/uL — ABNORMAL LOW (ref 3.87–5.11)
RDW: 16.3 % — ABNORMAL HIGH (ref 11.5–15.5)
WBC Count: 7.3 10*3/uL (ref 4.0–10.5)
nRBC: 0 % (ref 0.0–0.2)

## 2021-06-10 LAB — RETIC PANEL
Immature Retic Fract: 25.7 % — ABNORMAL HIGH (ref 2.3–15.9)
RBC.: 3.26 MIL/uL — ABNORMAL LOW (ref 3.87–5.11)
Retic Count, Absolute: 65.5 10*3/uL (ref 19.0–186.0)
Retic Ct Pct: 2 % (ref 0.4–3.1)
Reticulocyte Hemoglobin: 32.6 pg (ref >27.9)

## 2021-06-10 LAB — LACTATE DEHYDROGENASE: LDH: 211 U/L — ABNORMAL HIGH (ref 98–192)

## 2021-06-10 LAB — VITAMIN B12: Vitamin B-12: 492 pg/mL (ref 180–914)

## 2021-06-10 LAB — FOLATE: Folate: 17.2 ng/mL (ref >5.9)

## 2021-06-10 NOTE — Progress Notes (Signed)
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 Total protein 8.7 (nml 6.0-8.3), Iron sat 8%, ferritin 262, TIBC 204 06/10/2021: establish care with Dr. 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   On review of the previous records Ms. Rounsaville was last seen by primary care provider on 05/08/2021.  At that time she was noted to have a hemoglobin 9.7, MCV of 89.6, platelet count of 508.  Her total protein was 8.7 above the reference range.  At the time her iron saturation was 8%, ferritin 262, and total iron binding capacity 204.  Due to concern for these findings the patient was referred to hematology for further evaluation management.  On exam today Mrs. Scioneaux notes she is unsure how long she has had low hemoglobin.  She notes that she has had trouble with her blood for but associates it with her HIV medication.  She notes she is not having any overt signs of bleeding, bruising, or dark stools.  She is tolerating the iron prescription well and notes that she is taking it twice daily without any constipation or stomach upset.  She has been taking it with stool softeners.  She denies any issues with fatigue or shortness of breath.  She notes that she eats a relatively unrestricted diet but tries to avoid shellfish.  On further discussion she notes that she is a  never smoker and does not currently drink any alcohol.  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  She has 2 children 1 of whom unfortunately passed away.  She notes that her mother and father both passed away of old age and she does have some cancer history with 1 aunt having cervical cancer and the one having "bone" cancer.  She currently denies any fevers, chills, sweats, nausea, vomiting or diarrhea.  A full 10 point ROS is listed below.  MEDICAL HISTORY:  Past Medical History:  Diagnosis Date   Anxiety    Arthritis    Cancer (HCC)    CVA (cerebral infarction)    GERD (gastroesophageal reflux disease)    HIV infection (HCC)    HTN (hypertension)    Hyperlipidemia    Morbid obesity with BMI of 40.0-44.9, adult (HCC)    Postmenopausal bleeding    Stroke (HCC)    no residual paralysis    SURGICAL HISTORY: Past Surgical History:  Procedure Laterality Date   COLONOSCOPY  2000   HYSTEROSCOPY WITH D & C N/A 04/19/2013   Procedure: DILATATION AND CURETTAGE /HYSTEROSCOPY;  Surgeon: Allie Bossier, MD;  Location: [facility];  Service: Gynecology;  Laterality: N/A;   LYMPH NODE BIOPSY Right 09/17/2020   Procedure: LYMPH NODE BIOPSY; INGUINAL;  Surgeon: Lucretia Roers, MD;  Location: [facility];  Service: General;  Laterality: Right;    SOCIAL HISTORY: Social History   Socioeconomic History   Marital status: Widowed    Spouse name: Not on file  Number of children: Not on file   Years of education: Not on file   Highest education level: 10th grade  Occupational History   Not on file  Tobacco Use   Smoking status: Former    Packs/day: 1.00    Years: 5.00    Pack years: 5.00    Types: Cigarettes   Smokeless tobacco: Never  Vaping Use   Vaping Use: Never used  Substance and Sexual Activity   Alcohol use: No   Drug use: No   Sexual activity: Not Currently    Birth control/protection: None, Post-menopausal  Other Topics Concern   Not on file  Social History Narrative   Grew  up in [place], [place].   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   2 children, boy and girl. Son is 66s, and special needs.   Daughter in her 19s, is a Marine scientist.   Eats all food groups.   Wears seatbelt.   Attends church.   Enjoys watching tv, listening to [employer], and spending time with friends.   Enjoys going out to eat.    Social Determinants of Health   Financial Resource Strain: Low Risk    Difficulty of Paying Living Expenses: Not hard at all  Food Insecurity: No Food Insecurity   Worried About Charity fundraiser in the Last Year: Never true   [place] in the Last Year: Never true  Transportation Needs: No Transportation Needs   Lack of Transportation (Medical): No   Lack of Transportation (Non-Medical): No  Physical Activity: Sufficiently Active   Days of Exercise per Week: 7 days   Minutes of Exercise per Session: 60 min  Stress: Stress Concern Present   Feeling of Stress : To some extent  Social Connections: Moderately Isolated   Frequency of Communication with Friends and Family: More than three times a week   Frequency of Social Gatherings with Friends and Family: Once a week   Attends Religious Services: More than 4 times per year   Active Member of [employer] or Organizations: No   Attends Archivist Meetings: Never   Marital Status: Widowed  Human resources officer Violence: Not At Risk   Fear of Current or Ex-Partner: No   Emotionally Abused: No   Physically Abused: No   Sexually Abused: No    FAMILY HISTORY: Family History  Problem Relation Age of Onset   Hypertension Mother    Hypertension Sister    Arthritis Sister    Cancer Other    Diabetes Other     ALLERGIES:  is allergic to gadolinium derivatives, amlodipine, ivp dye [iodinated diagnostic agents], penicillins, tape, cephalexin, and latex.  MEDICATIONS:  Current Outpatient Medications  Medication Sig Dispense Refill   ferrous sulfate 325 (65 FE) MG tablet Take 325 mg by  mouth daily with breakfast.     acetaminophen (TYLENOL) 325 MG tablet      allopurinol (ZYLOPRIM) 100 MG tablet SMARTSIG:1 Tablet(s) By Mouth     aspirin (PX ENTERIC ASPIRIN) 81 MG EC tablet Take 1 tablet (81 mg total) by mouth daily. Swallow whole. 30 tablet 12   darunavir (PREZISTA) 600 MG tablet TAKE 1 TABLET (600 MG TOTAL) BY MOUTH 2 (TWO) TIMES DAILY WITH A MEAL. CRUSH AND TAKE BID WITH NORVIR POWDER PACKETS BID 60 tablet 11   dolutegravir (TIVICAY) 50 MG tablet TAKE 1 TABLET (50 MG TOTAL) BY MOUTH DAILY. 30 tablet 11   emtricitabine-tenofovir AF (DESCOVY) 200-25 MG tablet TAKE 1  TABLET BY MOUTH DAILY. (Patient not taking: Reported on 06/10/2021) 30 tablet 11   ibuprofen (ADVIL) 200 MG tablet Take 200 mg by mouth every 8 (eight) hours as needed for moderate pain.     levothyroxine (SYNTHROID) 25 MCG tablet Take 25 mcg by mouth every morning.     metoprolol succinate (TOPROL-XL) 25 MG 24 hr tablet Take 25 mg by mouth at bedtime.     olmesartan (BENICAR) 20 MG tablet Take 40 mg by mouth daily.     oxyCODONE (ROXICODONE) 5 MG immediate release tablet Take 1 tablet (5 mg total) by mouth every 4 (four) hours as needed for severe pain or breakthrough pain. 10 tablet 0   pantoprazole (PROTONIX) 40 MG tablet Take 40 mg by mouth daily.     ritonavir (NORVIR) 100 MG TABS tablet Take 1 tablet (100 mg total) by mouth 2 (two) times daily with a meal. 60 tablet 11   rosuvastatin (CRESTOR) 20 MG tablet SMARTSIG:1 Tablet(s) By Mouth Every Evening     tetrahydrozoline 0.05 % ophthalmic solution Place 1 drop into both eyes daily as needed (red eyes).     triamterene-hydrochlorothiazide (MAXZIDE-25) 37.5-25 MG tablet Take 1 tablet by mouth daily.     vitamin C (ASCORBIC ACID) 500 MG tablet Take 500 mg by mouth daily.     Vitamin D, Ergocalciferol, (DRISDOL) 1.25 MG (50000 UNIT) CAPS capsule Take 50,000 Units by mouth every 7 (seven) days.     No current facility-administered medications for this visit.     REVIEW OF SYSTEMS:   Constitutional: ( - ) fevers, ( - )  chills , ( - ) night sweats Eyes: ( - ) blurriness of vision, ( - ) double vision, ( - ) watery eyes Ears, nose, mouth, throat, and face: ( - ) mucositis, ( - ) sore throat Respiratory: ( - ) cough, ( - ) dyspnea, ( - ) wheezes Cardiovascular: ( - ) palpitation, ( - ) chest discomfort, ( - ) lower extremity swelling Gastrointestinal:  ( - ) nausea, ( - ) heartburn, ( - ) change in bowel habits Skin: ( - ) abnormal skin rashes Lymphatics: ( - ) new lymphadenopathy, ( - ) easy bruising Neurological: ( - ) numbness, ( - ) tingling, ( - ) new weaknesses Behavioral/Psych: ( - ) mood change, ( - ) new changes  All other systems were reviewed with the patient and are negative.  PHYSICAL EXAMINATION: ECOG PERFORMANCE STATUS: 0 - Asymptomatic  Vitals:   06/10/21 1322  BP: (!) 158/79  Pulse: 77  Resp: 18  Temp: 98.4 F (36.9 C)  SpO2: 100%   Filed Weights   06/10/21 1322  Weight: 241 lb 9.6 oz (109.6 kg)    GENERAL: well appearing elderly African-American female in NAD  SKIN: skin color, texture, turgor are normal, no rashes or significant lesions EYES: conjunctiva are pink and non-injected, sclera clear LUNGS: clear to auscultation and percussion with normal breathing effort HEART: regular rate & rhythm and no murmurs and no lower extremity edema Musculoskeletal: no cyanosis of digits and no clubbing  PSYCH: alert & oriented x 3, fluent speech NEURO: no focal motor/sensory deficits  LABORATORY DATA:  I have reviewed the data as listed CBC Latest Ref Rng & Units 03/25/2021 01/04/2021 11/21/2020  WBC 3.8 - 10.8 Thousand/uL 4.6 4.4 4.5  Hemoglobin 11.7 - 15.5 g/dL 67.8 11.5(L) 12.0  Hematocrit 35.0 - 45.0 % 38.2 35.1 37.0  Platelets 140 - 400 Thousand/uL 238 189 125(L)  CMP Latest Ref Rng & Units 03/25/2021 01/04/2021 11/21/2020  Glucose 65 - 99 mg/dL 83 131(H) 93  BUN 7 - 25 mg/dL $Remove'11 13 15  'qGIKHiA$ Creatinine 0.50 - 1.05 mg/dL  0.89 0.92 1.03(H)  Sodium 135 - 146 mmol/L 139 140 137  Potassium 3.5 - 5.3 mmol/L 3.8 3.9 4.4  Chloride 98 - 110 mmol/L 105 105 105  CO2 20 - 32 mmol/L $RemoveB'28 30 27  'gvGvBaoI$ Calcium 8.6 - 10.4 mg/dL 9.3 9.3 9.0  Total Protein 6.1 - 8.1 g/dL 8.5(H) 8.1 8.5(H)  Total Bilirubin 0.2 - 1.2 mg/dL 0.8 0.5 0.5  Alkaline Phos 38 - 126 U/L - - -  AST 10 - 35 U/L $Remo'17 19 16  'hXJwO$ ALT 6 - 29 U/L $Remo'9 13 11    'FMSvn$ RADIOGRAPHIC STUDIES: No results found.  ASSESSMENT & PLAN SUMMAR MCGLOTHLIN 70 y.o. 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   After review of the labs, review of the records, and discussion with the patient the patients findings are most consistent with a normocytic anemia and a mildly elevated serum protein.  Today we will connect a full work-up to include nutritional studies as well as a monoclonal gammopathy work-up.  #Elevated serum protein #Normocytic anemia --Recommend continuing ferrous sulfate 325 mg p.o. daily in order to help bolster her iron levels. --Unclear if iron deficiency is the cause of the patient's anemia.  We will order full nutritional work-up to include iron panel as well as vitamin B12 and folate --Due to concern for elevated protein we will order SPEP, UPEP, and serum free light chains as well as beta-2 microglobulin and LDH --Can consider bone marrow biopsy if no clear etiology for the patient's worsening anemia.  #Lymphadenopathy -- Previously seen by Dr. Delton Coombes for lymphadenopathy.  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 --Continue to monitor  Orders Placed This Encounter  Procedures   CBC with Differential ([place] Only)    Standing Status:   Future    Number of Occurrences:   1    Standing Expiration Date:   06/10/2022   CMP ([place] only)    Standing Status:   Future    Number of  Occurrences:   1    Standing Expiration Date:   06/10/2022   Lactate dehydrogenase (LDH)    Standing Status:   Future    Number of Occurrences:   1    Standing Expiration Date:   06/10/2022   Multiple Myeloma Panel (SPEP&IFE w/QIG)    Standing Status:   Future    Number of Occurrences:   1    Standing Expiration Date:   06/10/2022   Kappa/lambda light chains    Standing Status:   Future    Number of Occurrences:   1    Standing Expiration Date:   06/10/2022   24-Hr Ur UPEP/UIFE/Light Chains/TP    Standing Status:   Future    Standing Expiration Date:   06/10/2022   Beta 2 microglobulin    Standing Status:   Future    Number of Occurrences:   1    Standing Expiration Date:   06/10/2022   Ferritin    Standing Status:   Future    Number of Occurrences:   1    Standing Expiration Date:   06/10/2022   Iron and TIBC    Standing Status:  Future    Number of Occurrences:   1    Standing Expiration Date:   06/10/2022   Retic Panel    Standing Status:   Future    Number of Occurrences:   1    Standing Expiration Date:   06/10/2022   Vitamin B12    Standing Status:   Future    Number of Occurrences:   1    Standing Expiration Date:   06/10/2022   Folate, Serum    Standing Status:   Future    Number of Occurrences:   1    Standing Expiration Date:   06/10/2022   TSH    Standing Status:   Future    Number of Occurrences:   1    Standing Expiration Date:   06/10/2022    All questions were answered. The patient knows to call the clinic with any problems, questions or concerns.  A total of more than 40 minutes were spent on this encounter with face-to-face time and non-face-to-face time, including preparing to see the patient, ordering tests and/or medications, counseling the patient and coordination of care as outlined above.   Ledell Peoples, MD Department of Hematology/Oncology [place] [facility] Phone: [phone] Pager: [phone] Email:  [email or web address]  06/10/2021 2:37 PM

## 2021-06-11 LAB — KAPPA/LAMBDA LIGHT CHAINS
Kappa free light chain: 86.6 mg/L — ABNORMAL HIGH (ref 3.3–19.4)
Kappa, lambda light chain ratio: 1.81 — ABNORMAL HIGH (ref 0.26–1.65)
Lambda free light chains: 47.9 mg/L — ABNORMAL HIGH (ref 5.7–26.3)

## 2021-06-11 LAB — IRON AND TIBC
Iron: 36 ug/dL — ABNORMAL LOW (ref 41–142)
Saturation Ratios: 15 % — ABNORMAL LOW (ref 21–57)
TIBC: 238 ug/dL (ref 236–444)
UIBC: 201 ug/dL (ref 120–384)

## 2021-06-11 LAB — FERRITIN: Ferritin: 187 ng/mL (ref 11–307)

## 2021-06-11 LAB — BETA 2 MICROGLOBULIN, SERUM: Beta-2 Microglobulin: 2.5 mg/L — ABNORMAL HIGH (ref 0.6–2.4)

## 2021-06-11 LAB — TSH: TSH: 69.289 u[IU]/mL — ABNORMAL HIGH (ref 0.308–3.960)

## 2021-06-12 ENCOUNTER — Other Ambulatory Visit: Payer: Self-pay

## 2021-06-12 DIAGNOSIS — K219 Gastro-esophageal reflux disease without esophagitis: Secondary | ICD-10-CM | POA: Diagnosis not present

## 2021-06-12 DIAGNOSIS — Z8673 Personal history of transient ischemic attack (TIA), and cerebral infarction without residual deficits: Secondary | ICD-10-CM | POA: Diagnosis not present

## 2021-06-12 DIAGNOSIS — D649 Anemia, unspecified: Secondary | ICD-10-CM | POA: Diagnosis not present

## 2021-06-12 DIAGNOSIS — E785 Hyperlipidemia, unspecified: Secondary | ICD-10-CM | POA: Diagnosis not present

## 2021-06-12 DIAGNOSIS — I10 Essential (primary) hypertension: Secondary | ICD-10-CM | POA: Diagnosis not present

## 2021-06-12 DIAGNOSIS — D472 Monoclonal gammopathy: Secondary | ICD-10-CM | POA: Diagnosis not present

## 2021-06-12 LAB — MULTIPLE MYELOMA PANEL, SERUM
Albumin SerPl Elph-Mcnc: 3.1 g/dL (ref 2.9–4.4)
Albumin/Glob SerPl: 0.7 (ref 0.7–1.7)
Alpha 1: 0.3 g/dL (ref 0.0–0.4)
Alpha2 Glob SerPl Elph-Mcnc: 0.9 g/dL (ref 0.4–1.0)
B-Globulin SerPl Elph-Mcnc: 1.3 g/dL (ref 0.7–1.3)
Gamma Glob SerPl Elph-Mcnc: 2.1 g/dL — ABNORMAL HIGH (ref 0.4–1.8)
Globulin, Total: 4.6 g/dL — ABNORMAL HIGH (ref 2.2–3.9)
IgA: 337 mg/dL (ref 87–352)
IgG (Immunoglobin G), Serum: 2319 mg/dL — ABNORMAL HIGH (ref 586–1602)
IgM (Immunoglobulin M), Srm: 123 mg/dL (ref 26–217)
Total Protein ELP: 7.7 g/dL (ref 6.0–8.5)

## 2021-06-14 LAB — UPEP/UIFE/LIGHT CHAINS/TP, 24-HR UR
% BETA, Urine: 12.8 %
ALPHA 1 URINE: 6 %
Albumin, U: 52.9 %
Alpha 2, Urine: 17.8 %
Free Kappa Lt Chains,Ur: 7.02 mg/L (ref 1.17–86.46)
Free Kappa/Lambda Ratio: 7.47 (ref 1.83–14.26)
Free Lambda Lt Chains,Ur: 0.94 mg/L (ref 0.27–15.21)
GAMMA GLOBULIN URINE: 10.6 %
Total Protein, Urine-Ur/day: 173 mg/24 hr — ABNORMAL HIGH (ref 30–150)
Total Protein, Urine: 12.8 mg/dL
Total Volume: 1350

## 2021-06-19 ENCOUNTER — Ambulatory Visit: Payer: Medicare Other | Attending: Internal Medicine

## 2021-06-19 ENCOUNTER — Other Ambulatory Visit (HOSPITAL_BASED_OUTPATIENT_CLINIC_OR_DEPARTMENT_OTHER): Payer: Self-pay

## 2021-06-19 DIAGNOSIS — Z23 Encounter for immunization: Secondary | ICD-10-CM

## 2021-06-19 MED ORDER — MODERNA COVID-19 BIVAL BOOSTER 50 MCG/0.5ML IM SUSP
INTRAMUSCULAR | 0 refills | Status: DC
  Filled 2021-06-19: qty 0.5, 1d supply, fill #0

## 2021-06-19 NOTE — Progress Notes (Signed)
   Covid-19 Vaccination Clinic  Name:  Laura Jimenez    MRN: [id number] DOB: Jun 20, 1951  06/19/2021  [name] was observed post Covid-19 immunization for 15 minutes without incident. She was provided with Vaccine Information Sheet and instruction to access the V-Safe system.   [name] was instructed to call 911 with any severe reactions post vaccine: Difficulty breathing  Swelling of face and throat  A fast heartbeat  A bad rash all over body  Dizziness and weakness   Immunizations Administered     Name Date Dose VIS Date Route   Moderna Covid-19 vaccine Bivalent Booster 06/19/2021  3:45 PM 0.5 mL 04/06/2021 Intramuscular   Manufacturer: Moderna   Lot: [id number]   [place]: [id number]

## 2021-06-28 DIAGNOSIS — E0590 Thyrotoxicosis, unspecified without thyrotoxic crisis or storm: Secondary | ICD-10-CM | POA: Diagnosis not present

## 2021-06-28 DIAGNOSIS — E069 Thyroiditis, unspecified: Secondary | ICD-10-CM | POA: Diagnosis not present

## 2021-07-10 ENCOUNTER — Telehealth: Payer: Self-pay | Admitting: *Deleted

## 2021-07-10 NOTE — Telephone Encounter (Signed)
Eived call from patient  regarding her labs from October 17.   Please advise regarding the results.

## 2021-07-11 ENCOUNTER — Telehealth: Payer: Self-pay | Admitting: *Deleted

## 2021-07-11 NOTE — Telephone Encounter (Signed)
TCT patient regarding recent lab results.  Spoke with her and she was advised to f/u with her endocrinologist regarding her her hypothyroidism. She voiced understanding.

## 2021-07-11 NOTE — Telephone Encounter (Signed)
-----   Message from Orson Slick, MD sent at 07/11/2021  2:47 PM EST ----- Patient's results are concerning for hypothyroidism. There were no other abnormalities noted in the blood. Encourage patient to f/u with PCP regarding these findings.   ----- Message ----- From: Buel Ream, Lab In [place] Sent: 06/10/2021   2:52 PM EST To: Orson Slick, MD

## 2021-07-31 ENCOUNTER — Other Ambulatory Visit: Payer: Self-pay

## 2021-07-31 ENCOUNTER — Encounter: Payer: Self-pay | Admitting: Infectious Disease

## 2021-07-31 ENCOUNTER — Ambulatory Visit: Payer: Medicare Other | Admitting: Infectious Disease

## 2021-07-31 ENCOUNTER — Ambulatory Visit (INDEPENDENT_AMBULATORY_CARE_PROVIDER_SITE_OTHER): Payer: Medicare Other | Admitting: Infectious Disease

## 2021-07-31 VITALS — BP 162/93 | HR 61 | Resp 16 | Ht 64.0 in | Wt 249.6 lb

## 2021-07-31 DIAGNOSIS — B20 Human immunodeficiency virus [HIV] disease: Secondary | ICD-10-CM

## 2021-07-31 DIAGNOSIS — I10 Essential (primary) hypertension: Secondary | ICD-10-CM | POA: Diagnosis not present

## 2021-07-31 DIAGNOSIS — E063 Autoimmune thyroiditis: Secondary | ICD-10-CM | POA: Diagnosis not present

## 2021-07-31 DIAGNOSIS — H5789 Other specified disorders of eye and adnexa: Secondary | ICD-10-CM | POA: Diagnosis not present

## 2021-07-31 DIAGNOSIS — R7401 Elevation of levels of liver transaminase levels: Secondary | ICD-10-CM | POA: Diagnosis not present

## 2021-07-31 DIAGNOSIS — E038 Other specified hypothyroidism: Secondary | ICD-10-CM | POA: Diagnosis not present

## 2021-07-31 DIAGNOSIS — E039 Hypothyroidism, unspecified: Secondary | ICD-10-CM | POA: Diagnosis not present

## 2021-07-31 DIAGNOSIS — E069 Thyroiditis, unspecified: Secondary | ICD-10-CM | POA: Diagnosis not present

## 2021-07-31 DIAGNOSIS — F331 Major depressive disorder, recurrent, moderate: Secondary | ICD-10-CM

## 2021-07-31 HISTORY — DX: Other specified disorders of eye and adnexa: H57.89

## 2021-07-31 NOTE — Patient Instructions (Signed)
Please take ALL of the following:  Prezista 600mg  TWICE daily with   Norvir 100mg  TWICE daily with   Tivicay 50mg  once daily and  Descovy one pill once daily  I have scheduled you with [facility] after Christmas  Please bring ALL of your meds with you to that appt

## 2021-07-31 NOTE — Progress Notes (Signed)
Subjective:  Chief complaint: Multiple concerns including that her DESCOVY is potentially causing problems for her.   Patient ID: Laura Jimenez, female    DOB: 05-07-1951, 70 y.o.   MRN: [id number]  HPI   Laura Jimenez is a 70 year-old African-American lady diagnosed with HIV in the 1990s who has a history of multidrug-resistant HIV and intermittent adherence to medications.   While she was at [facility] she was on a regimen of twice daily boosted darunavir (prezista) with norvir along with emtricitabine and isentress BID and with VL <20 fairly consistently in 2016 period, then changed from [place] to [facility] for simplification of her regimen with reduction of pill burden and also giving her an integrase strand transfer inhibitor with higher barrier to resistance.   The patient has told us that she done developed a rash while on TIVICAY.  Certainly I cannot find documentation of her having developed a rash when she was switched to [facility] and she remained undetectable through several visits on this regimen of TIVICAY once daily Emtricitabine  and boosted darunavir ritonavir twice daily.   She is subsequently transferred care [facility] and she is largely been viremic although she did get a viral load down to 653 and November 29 of 2021.   There was a very nice note written at [facility] which Dr. Gale Journey had put into the chart which summarize all of the resistance mutations over time that the patient had documented since 2007.   I put those mutations back into [employer] and the results are below.        Drug resistance interpretation: PR   HIVDB 9.0 (2019-10-17) PI Major Resistance Mutations:   M46I, I54V, I84V, L90IMP   PI Accessory Resistance Mutations : G73S Other Mutations: L10I, L63P, A71V, V77I   Protease Inhibitors atazanavir/r (ATV/r)    High-Level Resistance darunavir/r (DRV/r)     Low-Level  Resistance lopinavir/r (LPV/r)        High-Level Resistance     PR comments PI Major M46I/L are relatively non-polymorphic PI-selected mutations. In combination with other PI-resistance mutations, they are associated with reduced susceptibility to each of the PIs except DRV. I54V is a non-polymorphic PI-selected mutation that contributes reduced susceptibility to each of the PIs except DRV. I84V is a non-polymorphic mutation selected by each of the PIs. It causes high-level resistance to ATV, FPV, IDV, NFV and SQV, intermediate resistance to LPV and TPV, and low-level resistance to DRV. L90M is a non-polymorphic PI-selected mutation that reduces susceptibility to each of the PIs except TPV and DRV. PI Accessory G73S/T/C/A are non-polymorphic accessory PI-selected mutations. They are associated primarily with reduced susceptibility to ATV, SQV, FPV, IDV, and NFV. G73V/D are rare non-polymorphic PI-selected mutations. Other L10I/V are polymorphic, PI-selected accessory mutations that increase the replication of viruses with other PI-resistance mutations. A71V/T are polymorphic, PI-selected accessory mutations that increase the replication of viruses with other PI-resistance mutations. L90M is a non-polymorphic PI-selected mutation which reduces susceptibility to each of the PIs except TPV and DRV. L90IP is a highly unusual mutation at this position. Dosage Considerations There is evidence for low-level DRV resistance. If DRV is administered it should be used twice daily.   Mutation scoring: PR HIVDB 9.0 (2019-10-17) PI         ATV/r   DRV/r  LPV/r M46I    10        0  10 M46I + I84V + L90IMP            5          0          5 M46I + L90IMP           10        0          0 I54V     15        0          15 I54V + L90IMP            10        0          5 G73S   10        0          5 G73S + L90IMP          10        0          0 I84V     60        15        30 L90IMP            25         0          15 Total    155      15        85   Drug resistance interpretation: RT HIVDB 9.0 (2019-10-17)   NRTI Resistance Mutations:   M41L, M184V, L210W, T215Y NNRTI Resistance Mutations: K103N, Y181C, G190A   Other Mutations: None     Nucleoside Reverse Transcriptase Inhibitors abacavir (ABC)           High-Level Resistance zidovudine (AZT)        High-Level Resistance emtricitabine (FTC)     High-Level Resistance lamivudine (3TC)        High-Level Resistance tenofovir (TDF)            Intermediate Resistance Non-nucleoside Reverse Transcriptase Inhibitors doravirine (DOR)         Intermediate Resistance efavirenz (EFV)           High-Level Resistance etravirine (ETR)          Intermediate Resistance nevirapine (NVP)        High-Level Resistance rilpivirine (RPV)           High-Level Resistance     RT comments NRTI M41L is a TAM that usually occurs with T215Y. In combination, M41L plus T215Y confer intermediate / high-level resistance to AZT and d4T and contribute to reduced ddI, ABC and TDF susceptibility. M184V/I cause high-level in vitro resistance to 3TC and FTC and low-level resistance to ddI and ABC. However, M184V/I are not contraindications to continued treatment with 3TC or FTC because they increase susceptibility to AZT, TDF and d4T and are associated with clinically significant reductions in HIV-1 replication. L210W is a TAM that usually occurs in combination with M41L and T215Y. The combination of M41, L210W and T215Y causes high-level resistance to AZT and d4T and intermediate to high-level resistance to ddI, ABC and TDF. T215Y is a TAM that causes intermediate/high-level resistance to AZT and d4T, low-level resistance to ddI, and potentially low-level resistance to ABC and TDF. NNRTI K103N is a non-polymorphic mutation that causes high-level reductions in NVP and EFV susceptibility. Y181C is a non-polymorphic mutation selected in patients receiving NVP, ETR  and RPV. It reduces susceptibility to  NVP, ETR, RPV, and EFV by >50-fold, 5-fold, 3-fold, and 2-fold, respectively. Although Y181C itself reduces EFV susceptibility by only 2-fold, it has been associated with a reduced response to an EFV-containing regimen in NNRTI-experienced patients. Y181C has a weight of 2.5 in the [employer] ETR genotypic susceptibility score. Alone, it does not appear to reduce DOR susceptibility. G190A is a non-polymorphic mutation that causes high-level resistance to NVP and intermediate resistance to EFV. It has a weight of 1.0 in the [employer] ETR genotypic susceptibility score but does not appear to be selected by ETR or RPV or to reduce their in vitro susceptibility in the absence of other NNRTI-resistance mutations. It also does not appear to reduce DOR susceptibility. Mutation scoring: RT HIVDB 9.0 (2019-10-17) NRTI    ABC     AZT     FTC     3TC     TDF M41L   5          15        0          0          5 M41L + L210W           10        10        0          0          10 M41L + L210W + T215Y        5          0          5          5          5  M41L + T215Y            15        10        5          5          10  M184V 15        -10       60        60        -10 L210W 5          15        0          0          5 L210W + T215Y          10        10        5          5          10  T215Y  10        40        0          0          10 Total    75        90        75        75        45 NNRTI DOR    EFV     ETR     NVP     RPV K103N + Y181C          10        0          0  0          0 Y181C 10        30        30         60        45 Y181C + G190A          20        0          10        0          10 K103N 0          60        0          60        0 G190A 0          45        10        60        15 Total    40        135      50        180      70     She appeared previously  to frequently only take the boosted protease inhibitor not take the [facility] because she was  concerned that is causing the rash that she has.  Dr. Gale Journey has been skeptical of this and I am as well.   She was scheduled for a biopsy with dermatology in April.    She was scheduled with me for the first time to consider different regimens and options for her to try to improve her adherence.   She was accompanied by her daughter to clinic  They again detailed the issues she has with esophageal stricture.  She does crush both the Prezista and the Norvir but is able to take the [facility] without difficulty.   I checked with Patrick Jupiter D and he had informed me that Norvir should not be crushed due to it solubility issues.  Endeavored to procure powdered no Norvir and I sent prescriptions to [place] for this along with a prescription for DESCOVY which I thought she really needed added to her regimen.  It turns out that scripts were then converted over to her pharmacy that she prefers and she came to clinic with medications from there.  However the that pharmacy also did not have powdered NORVIR the prescription is written as if it is powder NORVIR that can be dissolved but clearly on examining the tablets they are not powdered nor dissolvable.  I had extensive discussion with Laura Jimenez our pharmacy tech and it looks as if [facility] cannot procure NORVIR powdered packs and we could not obtain it.  The patient in the interim has been taking her medications with crushed Prezista twice daily along with crushed Norvir twice daily TIVICAY once daily and DESCOVY once daily.  In follow-up she had remained suppressed based on her viral loads from March through August of 2022.  She also had been tolerating DESCOVY quite well.   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  She was also treated for cystitis.  She had a transient elevation in transaminases which resolved.  Has been followed by Dr.  Barton Dubois at [facility] with endocrinology.  The patient is told me that she developed eye swelling and nausea and malaise while on a new medication for her thyroid.  She said she stopped this medication and also stopped the DESCOVY because she thought that it also might be to blame.  She has quite a bit of concern about potential liver toxicity from medications though she has had no evidence of hepatotoxicity from her antiretrovirals and had normal transaminases here in the clinic and more recently at discharge when she was hospitalized at [facility].  Of note her viral load while an inpatient was 143 on April 28, 2021 at [facility].     Past Medical History:  Diagnosis Date   Anxiety    Arthritis    Cancer ([place])    CVA (cerebral infarction)    GERD (gastroesophageal reflux disease)    HIV infection ([place])    HTN (hypertension)    Hyperlipidemia    Morbid obesity with BMI of 40.0-44.9, adult ([place])    Postmenopausal bleeding    Stroke ([place])    no residual paralysis    Past Surgical History:  Procedure Laterality Date   COLONOSCOPY  2000   HYSTEROSCOPY WITH D & C N/A 04/19/2013   Procedure: DILATATION AND CURETTAGE /HYSTEROSCOPY;  Surgeon: Emily Filbert, MD;  Location: [place] [facility];  Service: Gynecology;  Laterality: N/A;   LYMPH NODE BIOPSY Right 09/17/2020   Procedure: LYMPH NODE BIOPSY; INGUINAL;  Surgeon: Virl Cagey, MD;  Location: [facility];  Service: General;  Laterality: Right;    Family History  Problem Relation Age of Onset   Hypertension Mother    Hypertension Sister    Arthritis Sister    Cancer Other    Diabetes Other       Social History   Socioeconomic History   Marital status: Widowed    Spouse name: Not on file   Number of children: Not on file   Years of education: Not on file   Highest education  level: 10th grade  Occupational History   Not on file  Tobacco Use   Smoking status: Former    Packs/day: 1.00    Years: 5.00    Pack years: 5.00    Types: Cigarettes   Smokeless tobacco: Never  Vaping Use   Vaping Use: Never used  Substance and Sexual Activity   Alcohol use: No   Drug use: No   Sexual activity: Not Currently    Birth control/protection: None, Post-menopausal  Other Topics Concern   Not on file  Social History Narrative   Grew up in [place], [place].   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   2 children, boy and girl. Son is 71s, and special needs.   Daughter in her 109s, is a Marine scientist.   Eats all food groups.   Wears seatbelt.   Attends church.   Enjoys watching tv, listening to [employer], and spending time with friends.   Enjoys going out to eat.    Social Determinants of Health   Financial Resource Strain: Not on file  Food Insecurity: Not on file  Transportation Needs: Not on file  Physical Activity: Not on file  Stress: Not on file  Social Connections: Not on file    Allergies  Allergen Reactions   Gadolinium Derivatives Nausea And Vomiting    Vomited with multihance 12/05/19 Vomited with multihance 12/05/19   Amlodipine Other (See Comments)   Ivp Dye [Iodinated  Diagnostic Agents] Other (See Comments)    Caused kidney damage   Penicillins Swelling   Tape Dermatitis   Cephalexin Rash   Latex Rash     Current Outpatient Medications:    allopurinol (ZYLOPRIM) 100 MG tablet, 1 tablet, Disp: , Rfl:    aspirin (PX ENTERIC ASPIRIN) 81 MG EC tablet, Take 1 tablet (81 mg total) by mouth daily. Swallow whole., Disp: 30 tablet, Rfl: 12   darunavir (PREZISTA) 600 MG tablet, TAKE 1 TABLET (600 MG TOTAL) BY MOUTH 2 (TWO) TIMES DAILY WITH A MEAL. CRUSH AND TAKE BID WITH NORVIR POWDER PACKETS BID, Disp: 60 tablet, Rfl: 11   dolutegravir (TIVICAY) 50 MG tablet, TAKE 1 TABLET (50 MG TOTAL) BY MOUTH DAILY., Disp: 30 tablet, Rfl: 11   ferrous  sulfate 325 (65 FE) MG tablet, Take 325 mg by mouth daily with breakfast., Disp: , Rfl:    ibuprofen (ADVIL) 200 MG tablet, Take 200 mg by mouth every 8 (eight) hours as needed for moderate pain., Disp: , Rfl:    metoprolol succinate (TOPROL-XL) 25 MG 24 hr tablet, Take 25 mg by mouth at bedtime., Disp: , Rfl:    olmesartan (BENICAR) 20 MG tablet, Take 40 mg by mouth daily., Disp: , Rfl:    pantoprazole (PROTONIX) 40 MG tablet, Take 40 mg by mouth daily., Disp: , Rfl:    PREZISTA 600 MG tablet, Take by mouth., Disp: , Rfl:    ritonavir (NORVIR) 100 MG TABS tablet, Take 1 tablet (100 mg total) by mouth 2 (two) times daily with a meal., Disp: 60 tablet, Rfl: 11   rosuvastatin (CRESTOR) 20 MG tablet, SMARTSIG:1 Tablet(s) By Mouth Every Evening, Disp: , Rfl:    tetrahydrozoline 0.05 % ophthalmic solution, Place 1 drop into both eyes daily as needed (red eyes)., Disp: , Rfl:    triamterene-hydrochlorothiazide (MAXZIDE-25) 37.5-25 MG tablet, Take 1 tablet by mouth daily., Disp: , Rfl:    vitamin C (ASCORBIC ACID) 500 MG tablet, 1 tablet, Disp: , Rfl:    Vitamin D, Ergocalciferol, (DRISDOL) 1.25 MG (50000 UNIT) CAPS capsule, Take 50,000 Units by mouth every 7 (seven) days., Disp: , Rfl:    acetaminophen (TYLENOL) 325 MG tablet, , Disp: , Rfl:    emtricitabine-tenofovir AF (DESCOVY) 200-25 MG tablet, TAKE 1 TABLET BY MOUTH DAILY. (Patient not taking: Reported on 06/10/2021), Disp: 30 tablet, Rfl: 11   levothyroxine (SYNTHROID) 25 MCG tablet, Take 25 mcg by mouth every morning. (Patient not taking: Reported on 07/31/2021), Disp: , Rfl:     Review of Systems  Constitutional:  Negative for activity change, appetite change, chills, diaphoresis, fatigue, fever and unexpected weight change.  HENT:  Negative for congestion, rhinorrhea, sinus pressure, sneezing, sore throat and trouble swallowing.   Eyes:  Negative for photophobia and visual disturbance.  Respiratory:  Negative for cough, chest tightness,  shortness of breath, wheezing and stridor.   Cardiovascular:  Negative for chest pain, palpitations and leg swelling.  Gastrointestinal:  Negative for abdominal distention, abdominal pain, anal bleeding, blood in stool, constipation, diarrhea, nausea and vomiting.  Genitourinary:  Negative for difficulty urinating, dysuria, flank pain and hematuria.  Musculoskeletal:  Negative for arthralgias, back pain, gait problem, joint swelling and myalgias.  Skin:  Negative for color change, pallor, rash and wound.  Neurological:  Negative for dizziness, tremors, weakness and light-headedness.  Hematological:  Negative for adenopathy. Does not bruise/bleed easily.  Psychiatric/Behavioral:  Positive for dysphoric mood. Negative for agitation, behavioral problems, confusion, decreased concentration and sleep disturbance.  The patient is nervous/anxious.       Objective:   Physical Exam Constitutional:      General: She is not in acute distress.    Appearance: Normal appearance. She is well-developed. She is obese. She is not ill-appearing or diaphoretic.  HENT:     Head: Normocephalic and atraumatic.     Right Ear: Hearing and external ear normal.     Left Ear: Hearing and external ear normal.     Nose: No nasal deformity or rhinorrhea.  Eyes:     General: No scleral icterus.    Conjunctiva/sclera: Conjunctivae normal.     Right eye: Right conjunctiva is not injected.     Left eye: Left conjunctiva is not injected.     Pupils: Pupils are equal, round, and reactive to light.  Neck:     Vascular: No JVD.  Cardiovascular:     Rate and Rhythm: Normal rate and regular rhythm.     Heart sounds: S1 normal and S2 normal.  Pulmonary:     Effort: Pulmonary effort is normal.     Breath sounds: No wheezing.  Abdominal:     General: There is no distension.     Tenderness: There is no abdominal tenderness.  Musculoskeletal:        General: Normal range of motion.     Right shoulder: Normal.     Left  shoulder: Normal.     Cervical back: Normal range of motion and neck supple.     Right hip: Normal.     Left hip: Normal.     Right knee: Normal.     Left knee: Normal.  Lymphadenopathy:     Head:     Right side of head: No submandibular, preauricular or posterior auricular adenopathy.     Left side of head: No submandibular, preauricular or posterior auricular adenopathy.     Cervical: No cervical adenopathy.     Right cervical: No superficial or deep cervical adenopathy.    Left cervical: No superficial or deep cervical adenopathy.  Skin:    General: Skin is warm and dry.     Coloration: Skin is not pale.     Findings: No abrasion, bruising, ecchymosis, erythema, lesion or rash.     Nails: There is no clubbing.  Neurological:     General: No focal deficit present.     Mental Status: She is alert and oriented to person, place, and time.     Sensory: No sensory deficit.     Coordination: Coordination normal.     Gait: Gait normal.  Psychiatric:        Attention and Perception: Attention normal. She is attentive.        Mood and Affect: Mood is depressed. Affect is labile.        Speech: Speech normal.        Behavior: Behavior is agitated. Behavior is cooperative.        Cognition and Memory: Cognition and memory normal.        Judgment: Judgment normal.          Assessment & Plan:  Multidrug-resistant HIV disease:  I really think it was a very bad idea to stop her DESCOVY.  She during the course the visit at times said that she was going to stop all of her medications.  I later was able to bring her daughter back to the room and her daughter spent time with me and Mckenna discussing her care.  She  also had a protracted conversation with her daughter in the room after I had left.  She agreed to lab testing today and agreed to plan of me calling her back once we have the labs to decide on whether she needs DESCOVY which she does.  I did discuss other options that could  be tried to take the place of DESCOVY which would include twice daily subcutaneous Fuzeon though I do not think she will like the knots that it typically causes as an injection site reaction.  She is obese so it is possible she might not experience this as some of our obese patients on Fuzeon do not have such significant injection site reactions.  Another option would be infusions of Trogarzo though we had great deal of trouble procuring this medication in the past.  Finally a drug which I think would be a nice 1 to use with 4 would be subcutaneous lenacapravir q 6 months after loading dose AFTER FDA approves  The only downside to this might be if she has it in her system without taking other antiretrovirals that she will risk sure resistance to that compound as well.  Schedule her for follow-up with Estill Bamberg at the end of December so that we can follow-up on how she is doing.  Hypothyroidism: I checked thyroid function test today and she is upcoming appointment with her endocrinologist.  Is not clear to me what medication she was on that she believed caused her eyes to swell.  Certainly I do not think it was any of her antiretrovirals  Recent transaminitis: This seems related to her acute illness with thyroiditis and potential cystitis when she was at [facility] her liver function tests have been normal every time we have been checking it here recently.  I also assured her we were checking liver function test today  Vaginal strictures: These required dilation in the past.  Depressive symptoms: She voiced would seem to me quite a bit of depressive symptoms of saying that no one listens to her and no one cares about her understands what she is going through.  As mentioned I will have her see Estill Bamberg at the end of the month and I intend to follow her closely.  I did not want to introduce new medications besides her antiretrovirals I think that is the 1 thing that I need to focus on and have  her focus on as far as her HIV is concerned.    She at times said that she was ready for the end of her life though she did not seem to me to be genuinely having active suicidal ideation and she is contracted for safety.  I spent 60 minutes with the patient including than 50% of the time in face to face counseling of the patient regarding her multidrug-resistant HIV and need to adhere to her medications or risk further virological failure, progression of her HIV infection with estimation of her immune system and risk for opportunistic infections and malignancies reviewing the different drugs that should be part of her regimen optional drugs that we could consider p reviewing past viral load CD4 counts genotypes along with reviewing along with review of medical records here at [facility] health and at [facility] hospital in preparation for the visit and during the visit and in coordination of her care.

## 2021-08-01 ENCOUNTER — Telehealth: Payer: Self-pay

## 2021-08-01 LAB — T-HELPER CELL (CD4) - (RCID CLINIC ONLY)
CD4 % Helper T Cell: 12 % — ABNORMAL LOW (ref 33–65)
CD4 T Cell Abs: 319 /uL — ABNORMAL LOW (ref 400–1790)

## 2021-08-01 NOTE — Telephone Encounter (Signed)
Discussed with patient the need to follow up with her PCP in regards to thyroid function. She says she's already aware of it and seeing a specialist.   Attempted to counsel patient on importance of taking Descovy in order to ensure a complete HIV regimen. Patient adamant that this medication makes her sick and that she will not take it.   Beryle Flock, RN

## 2021-08-01 NOTE — Telephone Encounter (Signed)
-----   Message from Truman Hayward, MD sent at 08/01/2021  9:03 AM EST ----- Thyroid function is way out of whack. Maybe why she doesn't feel good she claims to have had allergic rxn to thyroid med and then also decided to pick out one of her HIV meds to blame (Descovy) and stopped that too which she should not have ----- Message ----- From: Interface, [facility] Lab Results In Sent: 08/01/2021  12:30 AM EST To: Truman Hayward, MD

## 2021-08-03 LAB — COMPLETE METABOLIC PANEL WITH GFR
AG Ratio: 0.9 (calc) — ABNORMAL LOW (ref 1.0–2.5)
ALT: 10 U/L (ref 6–29)
AST: 19 U/L (ref 10–35)
Albumin: 3.9 g/dL (ref 3.6–5.1)
Alkaline phosphatase (APISO): 76 U/L (ref 37–153)
BUN: 11 mg/dL (ref 7–25)
CO2: 23 mmol/L (ref 20–32)
Calcium: 8.9 mg/dL (ref 8.6–10.4)
Chloride: 104 mmol/L (ref 98–110)
Creat: 0.98 mg/dL (ref 0.50–1.05)
Globulin: 4.3 g/dL (calc) — ABNORMAL HIGH (ref 1.9–3.7)
Glucose, Bld: 85 mg/dL (ref 65–99)
Potassium: 4 mmol/L (ref 3.5–5.3)
Sodium: 139 mmol/L (ref 135–146)
Total Bilirubin: 0.5 mg/dL (ref 0.2–1.2)
Total Protein: 8.2 g/dL — ABNORMAL HIGH (ref 6.1–8.1)
eGFR: 63 mL/min/{1.73_m2} (ref >=60)

## 2021-08-03 LAB — TSH+FREE T4: TSH W/REFLEX TO FT4: 88.65 mIU/L — ABNORMAL HIGH (ref 0.40–4.50)

## 2021-08-03 LAB — RPR: RPR Ser Ql: NONREACTIVE

## 2021-08-03 LAB — T4, FREE: Free T4: 0.3 ng/dL — ABNORMAL LOW (ref 0.8–1.8)

## 2021-08-03 LAB — HIV RNA, RTPCR W/R GT (RTI, PI,INT)
HIV 1 RNA Quant: 355 copies/mL — ABNORMAL HIGH
HIV-1 RNA Quant, Log: 2.55 Log copies/mL — ABNORMAL HIGH

## 2021-08-05 ENCOUNTER — Telehealth: Payer: Self-pay

## 2021-08-05 NOTE — Telephone Encounter (Signed)
Spoke with patient, she says the Descovy makes her throw up, but that she is taking the Tivicay, Norvir, and Prezista.   Relayed that her viral load is 355 and that it would be preferable if she could take the Descovy as well, she states she is unable to do this.   Beryle Flock, RN

## 2021-08-05 NOTE — Telephone Encounter (Signed)
-----   Message from Truman Hayward, MD sent at 08/05/2021  8:37 AM EST ----- VL not too bad without Descovy I would prefer if she took this and her other meds and I hope she has not gone back to simply stopping everytyhing ----- Message ----- From: Cheyenne Adas Lab Results In Sent: 08/01/2021  12:30 AM EST To: Truman Hayward, MD

## 2021-08-06 DIAGNOSIS — I10 Essential (primary) hypertension: Secondary | ICD-10-CM | POA: Diagnosis not present

## 2021-08-06 DIAGNOSIS — I700 Atherosclerosis of aorta: Secondary | ICD-10-CM | POA: Diagnosis not present

## 2021-08-06 DIAGNOSIS — E782 Mixed hyperlipidemia: Secondary | ICD-10-CM | POA: Diagnosis not present

## 2021-08-06 DIAGNOSIS — D649 Anemia, unspecified: Secondary | ICD-10-CM | POA: Diagnosis not present

## 2021-08-21 ENCOUNTER — Other Ambulatory Visit (HOSPITAL_COMMUNITY): Payer: Self-pay

## 2021-08-21 ENCOUNTER — Ambulatory Visit (INDEPENDENT_AMBULATORY_CARE_PROVIDER_SITE_OTHER): Payer: Medicare Other | Admitting: Pharmacist

## 2021-08-21 ENCOUNTER — Other Ambulatory Visit: Payer: Self-pay

## 2021-08-21 DIAGNOSIS — B20 Human immunodeficiency virus [HIV] disease: Secondary | ICD-10-CM | POA: Diagnosis not present

## 2021-08-21 NOTE — Progress Notes (Signed)
08/21/2021  HPI: Laura Jimenez is a 70 y.o. female who presents to the [facility] for HIV follow-up.  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   History of CVA (cerebrovascular accident) 11/13/2011   Post-menopausal bleeding 11/13/2011   HIV INFECTION 09/08/2006   CANDIDIASIS, VAGINAL 09/08/2006   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 Swallow whole.   DARUNAVIR (PREZISTA) 600 MG TABLET    TAKE 1 TABLET (600 MG TOTAL) BY MOUTH 2 (TWO) TIMES DAILY WITH A MEAL. CRUSH AND TAKE BID WITH NORVIR POWDER PACKETS BID   DOLUTEGRAVIR (TIVICAY) 50 MG TABLET    TAKE 1 TABLET (50 MG TOTAL) BY MOUTH DAILY.   EMTRICITABINE-TENOFOVIR AF (DESCOVY) 200-25 MG TABLET    TAKE 1 TABLET BY MOUTH DAILY.   FERROUS SULFATE 325 (65 FE) MG TABLET    Take 325 mg by mouth daily with breakfast.   IBUPROFEN (ADVIL) 200 MG TABLET    Take 200 mg by mouth every 8 (eight) hours as needed for moderate pain.   LEVOTHYROXINE (SYNTHROID) 25 MCG TABLET     Take 25 mcg by mouth every morning.   METOPROLOL SUCCINATE (TOPROL-XL) 25 MG 24 HR TABLET    Take 25 mg by mouth at bedtime.   OLMESARTAN (BENICAR) 20 MG TABLET    Take 40 mg by mouth daily.   PANTOPRAZOLE (PROTONIX) 40 MG TABLET    Take 40 mg by mouth daily.   PREZISTA 600 MG TABLET    Take by mouth.   RITONAVIR (NORVIR) 100 MG TABS TABLET    Take 1 tablet (100 mg total) by mouth 2 (two) times daily with a meal.   ROSUVASTATIN (CRESTOR) 20 MG TABLET    SMARTSIG:1 Tablet(s) By Mouth Every Evening   TETRAHYDROZOLINE 0.05 % OPHTHALMIC SOLUTION    Place 1 drop into both eyes daily as needed (red eyes).   TRIAMTERENE-HYDROCHLOROTHIAZIDE (MAXZIDE-25) 37.5-25 MG TABLET    Take 1 tablet by mouth daily.   VITAMIN C (ASCORBIC ACID) 500 MG TABLET    1 tablet   VITAMIN D, ERGOCALCIFEROL, (DRISDOL) 1.25 MG (50000 UNIT) CAPS CAPSULE    Take 50,000 Units by mouth every 7 (seven) days.  Modified Medications   No medications on file  Discontinued Medications   No medications on file    Allergies: Allergies  Allergen Reactions   Gadolinium Derivatives Nausea And Vomiting    Vomited with multihance 12/05/19 Vomited with multihance 12/05/19   Amlodipine Other (See Comments)  Ivp Dye [Iodinated Contrast Media] Other (See Comments)    Caused kidney damage   Penicillins Swelling   Tape Dermatitis   Cephalexin Rash   Latex Rash    Past Medical History: Past Medical History:  Diagnosis Date   Anxiety    Arthritis    Cancer ([place])    CVA (cerebral infarction)    Eye swelling 07/31/2021   GERD (gastroesophageal reflux disease)    HIV infection (H[facility])    HTN (hypertension)    Hyperlipidemia    Morbid obesity with BMI of 40.0-44.9, adult ([place])    Postmenopausal bleeding    Stroke ([place])    no residual paralysis    Social History: Social History   Socioeconomic History   Marital status: Widowed    Spouse name: Not on file   Number of children: Not on file   Years of education: Not on file    Highest education level: 10th grade  Occupational History   Not on file  Tobacco Use   Smoking status: Former    Packs/day: 1.00    Years: 5.00    Pack years: 5.00    Types: Cigarettes   Smokeless tobacco: Never  Vaping Use   Vaping Use: Never used  Substance and Sexual Activity   Alcohol use: No   Drug use: No   Sexual activity: Not Currently    Birth control/protection: None, Post-menopausal  Other Topics Concern   Not on file  Social History Narrative   Grew up in [place], [place].   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   2 children, boy and girl. Son is 20s, and special needs.   Daughter in her 30s, is a Marine scientist.   Eats all food groups.   Wears seatbelt.   Attends church.   Enjoys watching tv, listening to [employer], and spending time with friends.   Enjoys going out to eat.    Social Determinants of Health   Financial Resource Strain: Not on file  Food Insecurity: Not on file  Transportation Needs: Not on file  Physical Activity: Not on file  Stress: Not on file  Social Connections: Not on file    Labs: Lab Results  Component Value Date   HIV1RNAQUANT 355 (H) 07/31/2021   HIV1RNAQUANT 71 (H) 03/25/2021   HIV1RNAQUANT 83 (H) 01/04/2021   CD4TABS 319 (L) 07/31/2021   CD4TABS 318 (L) 03/25/2021   CD4TABS 239 (L) 11/21/2020    RPR and STI Lab Results  Component Value Date   LABRPR NON-REACTIVE 07/31/2021   LABRPR NON-REACTIVE 03/25/2021   LABRPR NON-REACTIVE 01/04/2021   LABRPR NON-REACTIVE 06/04/2020    STI Results GC CT  01/04/2021 Negative Negative  06/04/2020 Negative Negative    Hepatitis B Lab Results  Component Value Date   HEPBSAB NON-REACTIVE 06/04/2020   HEPBSAG NON-REACTIVE 06/04/2020   HEPBCAB NON-REACTIVE 06/04/2020   Hepatitis C Lab Results  Component Value Date   HEPCAB NON-REACTIVE 06/04/2020   Hepatitis A Lab Results  Component Value Date   HAV REACTIVE (A) 06/04/2020   Lipids: Lab Results  Component  Value Date   CHOL 176 03/25/2021   TRIG 136 03/25/2021   HDL 50 03/25/2021   CHOLHDL 3.5 03/25/2021   LDLCALC 103 (H) 03/25/2021    Cumulative HIV Genotype Data   RT Mutations  M41L, M184V, L210W, T215Y, K103N, Y181C, G190A  PI Mutations  M46I, I54V, I84B, L90M, G73S, L10I, L63P, A71V, V77I  Integrase Mutations      Interpretation  of Genotype Data per [employer] HIV Drug Resistance Database:   Nucleoside RTIs  Abacavir - high level resistance Zidovudine - high level resistance Emtricitabine - high level resistance Lamivudine - high level resistance Tenofovir - intermediate resistance    Non-Nucleoside RTIs  Doravirine - intermediate resistance Efavirenz - high level resistance Etravirine - intermediate resistance Nevirapine - high level resistance Rilpivirine - high level resistance    Protease Inhibitors  Atazanavir - high level resistance Darunavir - low level resistance Lopinavir - high level resistance    Integrase Inhibitors  Bictegravir - N/A Dolutegravir - N/A Elvitegravir - N/A Raltegravir - N/A    Current HIV Regimen: Prezista 600 mg BID Ritonavir 100 mg BID Tivicay 50 mg daily  Assessment: Laura Jimenez is here to follow up for her MDR HIV infection. She has struggled with adherence issues for years and thus has multiple resistant mutations (shown in chart above). She is currently prescribed Prezista and Novir BID, Tivicay daily, and Descovy daily. She states that she will not take Descovy as it made her ill. She started both Descovy and Synthroid at the same time a while back and felt dizzy and ill, so she stopped both. She restarted Descovy within the last few weeks since she saw Dr. [name] and states that it made her vomit. She also states that it made her eyes swollen, and she will not take it.   Discussed starting Truvada instead of Descovy. She states that she tolerated Truvada well in the past but said she would only take it if we stop one of her other  medications. I gently explained that she would be taking Tivicay, Prezista, and Norvir long term and any medication we start would be in addition to those three. She inquired about stopping her Norvir today too stating "I think I'm going to stop taking Norvir soon".  I told her that she could not do that as Prezista and Norvir are a package deal and she can never take one without the other. I also think Laura Jimenez would be a good option for her but she refuses that as well. She has trouble swallowing due to esophageal stricture and Laura Jimenez is quite large. It also cannot be split or crushed since it is extended release. She is crushing Norvir against our advice because she cannot obtain it in liquid form.  Asked her if she would ever be willing to take an injection and at first, she said she would if we stopped her other medications. I again told her that anything we start would be in addition to her other three medications not in place of. She seems semi-interested in [place] q6 month injection. It was approved for MDR HIV last week by the FDA but of course is not commercially available yet (I had Laura Jimenez check).  I will try to reach out to [facility] to see if we can obtain it somehow.   She states that she is going to try and go back to [facility] as she believes they can give her smaller pills to take. I asked her what she was taking before she transferred here, and she says the exact same thing she is taking now. I explained to her that we are going through all of our options but her resistance profile makes it nearly impossible to find available medications to treat her.  I reviewed her labs with her including her viral load of 355 and CD4 count of 319 from 07/31/21. I will have her follow up with Dr.  [facility] in 4-6 weeks to see if we had any luck with obtaining lenacapavir.   Plan: - Continue Prezista BID, Norvir BID, and Tivicay daily - See if lenacapavir is available - F/u with Dr. [name] 2/13 at  2pm  Maily Debarge L. Khianna Blazina, PharmD, BCIDP, AAHIVP, CPP Clinical Pharmacist Practitioner Infectious Diseases [facility] 08/21/2021, 9:35 AM

## 2021-09-10 ENCOUNTER — Other Ambulatory Visit (HOSPITAL_COMMUNITY): Payer: Self-pay

## 2021-09-11 ENCOUNTER — Inpatient Hospital Stay: Payer: Medicare Other | Attending: Hematology and Oncology

## 2021-09-11 ENCOUNTER — Other Ambulatory Visit: Payer: Self-pay

## 2021-09-11 ENCOUNTER — Inpatient Hospital Stay (HOSPITAL_BASED_OUTPATIENT_CLINIC_OR_DEPARTMENT_OTHER): Payer: Medicare Other | Admitting: Hematology and Oncology

## 2021-09-11 ENCOUNTER — Other Ambulatory Visit: Payer: Self-pay | Admitting: Hematology and Oncology

## 2021-09-11 VITALS — BP 164/92 | HR 65 | Temp 96.7°F | Resp 18 | Wt 248.3 lb

## 2021-09-11 DIAGNOSIS — R591 Generalized enlarged lymph nodes: Secondary | ICD-10-CM

## 2021-09-11 DIAGNOSIS — D472 Monoclonal gammopathy: Secondary | ICD-10-CM | POA: Diagnosis not present

## 2021-09-11 DIAGNOSIS — D649 Anemia, unspecified: Secondary | ICD-10-CM

## 2021-09-11 LAB — CMP (CANCER CENTER ONLY)
ALT: 20 U/L (ref 0–44)
AST: 27 U/L (ref 15–41)
Albumin: 4.1 g/dL (ref 3.5–5.0)
Alkaline Phosphatase: 74 U/L (ref 38–126)
Anion gap: 5 (ref 5–15)
BUN: 13 mg/dL (ref 8–23)
CO2: 30 mmol/L (ref 22–32)
Calcium: 9.3 mg/dL (ref 8.9–10.3)
Chloride: 103 mmol/L (ref 98–111)
Creatinine: 1.13 mg/dL — ABNORMAL HIGH (ref 0.44–1.00)
GFR, Estimated: 53 mL/min — ABNORMAL LOW (ref >60)
Glucose, Bld: 89 mg/dL (ref 70–99)
Potassium: 3.6 mmol/L (ref 3.5–5.1)
Sodium: 138 mmol/L (ref 135–145)
Total Bilirubin: 0.7 mg/dL (ref 0.3–1.2)
Total Protein: 8.7 g/dL — ABNORMAL HIGH (ref 6.5–8.1)

## 2021-09-11 LAB — LACTATE DEHYDROGENASE: LDH: 202 U/L — ABNORMAL HIGH (ref 98–192)

## 2021-09-11 LAB — CBC WITH DIFFERENTIAL (CANCER CENTER ONLY)
Abs Immature Granulocytes: 0.02 10*3/uL (ref 0.00–0.07)
Basophils Absolute: 0.1 10*3/uL (ref 0.0–0.1)
Basophils Relative: 1 %
Eosinophils Absolute: 0.1 10*3/uL (ref 0.0–0.5)
Eosinophils Relative: 1 %
HCT: 36.9 % (ref 36.0–46.0)
Hemoglobin: 12.2 g/dL (ref 12.0–15.0)
Immature Granulocytes: 0 %
Lymphocytes Relative: 38 %
Lymphs Abs: 2.9 10*3/uL (ref 0.7–4.0)
MCH: 32.1 pg (ref 26.0–34.0)
MCHC: 33.1 g/dL (ref 30.0–36.0)
MCV: 97.1 fL (ref 80.0–100.0)
Monocytes Absolute: 0.5 10*3/uL (ref 0.1–1.0)
Monocytes Relative: 7 %
Neutro Abs: 4.2 10*3/uL (ref 1.7–7.7)
Neutrophils Relative %: 53 %
Platelet Count: 212 10*3/uL (ref 150–400)
RBC: 3.8 MIL/uL — ABNORMAL LOW (ref 3.87–5.11)
RDW: 15.1 % (ref 11.5–15.5)
WBC Count: 7.8 10*3/uL (ref 4.0–10.5)
nRBC: 0 % (ref 0.0–0.2)

## 2021-09-11 LAB — RETIC PANEL
Immature Retic Fract: 15.6 % (ref 2.3–15.9)
RBC.: 3.83 MIL/uL — ABNORMAL LOW (ref 3.87–5.11)
Retic Count, Absolute: 67 10*3/uL (ref 19.0–186.0)
Retic Ct Pct: 1.8 % (ref 0.4–3.1)
Reticulocyte Hemoglobin: 35.4 pg (ref >27.9)

## 2021-09-11 NOTE — Progress Notes (Signed)
[facility] [phone]   [phone]  PROGRESS NOTE  Patient Care Team: Caren Macadam, MD as PCP - General (Family Medicine) Brien Mates, RN as Oncology Nurse Navigator (Oncology)  Hematological/Oncological History # Elevate Serum Protein # Iron Deficiency Anemia  81/2022: WBC 4.6, Hgb 12.3, MCV 94.8, Plt 238 05/08/2021: WBC 8.7, Hgb 9.7, MCV 89.6, Plt 508. Total protein 8.7 (nml 6.0-8.3), Iron sat 8%, ferritin 262, TIBC 204 06/10/2021: establish care with Dr. Lorenso Courier   Interval History:  Laura Jimenez 71 y.o. female with medical history significant for elevated serum protein and anemia who presents for a follow up visit. The patient's last visit was on 06/10/2021 at which time she established care. In the interim since the last visit her Hgb has improved on PO iron therapy.   Laura Jimenez notes that she has been well in the interim since her last visit.  She reports her energy levels are excellent currently measuring them at a 10 out of 10.  She notes that she is tolerating her iron pills quite well and is not having any issues with constipation or stomach upset.  Her appetite has been good and otherwise she has no questions, concerns or complaints.  She otherwise denies any fevers, chills, sweats, nausea, vomiting or diarrhea.  A full 10 point ROS is listed below.  MEDICAL HISTORY:  Past Medical History:  Diagnosis Date   Anxiety    Arthritis    Cancer ([place])    CVA (cerebral infarction)    Eye swelling 07/31/2021   GERD (gastroesophageal reflux disease)    HIV infection ([facility])    HTN (hypertension)    Hyperlipidemia    Morbid obesity with BMI of 40.0-44.9, adult ([place])    Postmenopausal bleeding    Stroke ([place])    no residual paralysis    SURGICAL HISTORY: Past Surgical History:  Procedure Laterality Date   COLONOSCOPY  2000   HYSTEROSCOPY WITH D & C N/A 04/19/2013   Procedure: DILATATION AND CURETTAGE /HYSTEROSCOPY;  Surgeon: Emily Filbert, MD;  Location: [facility];  Service: Gynecology;  Laterality: N/A;   LYMPH NODE BIOPSY Right 09/17/2020   Procedure: LYMPH NODE BIOPSY; INGUINAL;  Surgeon: Virl Cagey, MD;  Location: [facility];  Service: General;  Laterality: Right;    SOCIAL HISTORY: Social History   Socioeconomic History   Marital status: Widowed    Spouse name: Not on file   Number of children: Not on file   Years of education: Not on file   Highest education level: 10th grade  Occupational History   Not on file  Tobacco Use   Smoking status: Former    Packs/day: 1.00    Years: 5.00    Pack years: 5.00    Types: Cigarettes   Smokeless tobacco: Never  Vaping Use   Vaping Use: Never used  Substance and Sexual Activity   Alcohol use: No   Drug use: No   Sexual activity: Not Currently    Birth control/protection: None, Post-menopausal  Other Topics Concern   Not on file  Social History Narrative   Grew up in [place], [place].   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   2 children, boy and girl. Son is 77s, and special needs.   Daughter in her 72s, is a Marine scientist.   Eats all food groups.   Wears seatbelt.   Attends church.   Enjoys watching tv, listening to [employer], and spending time with  friends.   Enjoys going out to eat.    Social Determinants of Health   Financial Resource Strain: Not on file  Food Insecurity: Not on file  Transportation Needs: Not on file  Physical Activity: Not on file  Stress: Not on file  Social Connections: Not on file  Intimate Partner Violence: Not on file    FAMILY HISTORY: Family History  Problem Relation Age of Onset   Hypertension Mother    Hypertension Sister    Arthritis Sister    Cancer Other    Diabetes Other     ALLERGIES:  is allergic to gadolinium derivatives, amlodipine, ivp dye [iodinated contrast media], penicillins, tape, cephalexin, and latex.  MEDICATIONS:  Current Outpatient Medications  Medication Sig Dispense Refill    acetaminophen (TYLENOL) 325 MG tablet      allopurinol (ZYLOPRIM) 100 MG tablet 1 tablet     aspirin (PX ENTERIC ASPIRIN) 81 MG EC tablet Take 1 tablet (81 mg total) by mouth daily. Swallow whole. 30 tablet 12   darunavir (PREZISTA) 600 MG tablet TAKE 1 TABLET (600 MG TOTAL) BY MOUTH 2 (TWO) TIMES DAILY WITH A MEAL. CRUSH AND TAKE BID WITH NORVIR POWDER PACKETS BID 60 tablet 11   dolutegravir (TIVICAY) 50 MG tablet TAKE 1 TABLET (50 MG TOTAL) BY MOUTH DAILY. 30 tablet 11   ferrous sulfate 325 (65 FE) MG tablet Take 325 mg by mouth daily with breakfast.     ibuprofen (ADVIL) 200 MG tablet Take 200 mg by mouth every 8 (eight) hours as needed for moderate pain.     levothyroxine (SYNTHROID) 25 MCG tablet Take 25 mcg by mouth every morning. (Patient not taking: Reported on 07/31/2021)     metoprolol succinate (TOPROL-XL) 25 MG 24 hr tablet Take 25 mg by mouth at bedtime.     olmesartan (BENICAR) 20 MG tablet Take 40 mg by mouth daily.     pantoprazole (PROTONIX) 40 MG tablet Take 40 mg by mouth daily.     PREZISTA 600 MG tablet Take by mouth.     ritonavir (NORVIR) 100 MG TABS tablet Take 1 tablet (100 mg total) by mouth 2 (two) times daily with a meal. 60 tablet 11   rosuvastatin (CRESTOR) 20 MG tablet SMARTSIG:1 Tablet(s) By Mouth Every Evening     tetrahydrozoline 0.05 % ophthalmic solution Place 1 drop into both eyes daily as needed (red eyes).     triamterene-hydrochlorothiazide (MAXZIDE-25) 37.5-25 MG tablet Take 1 tablet by mouth daily.     vitamin C (ASCORBIC ACID) 500 MG tablet 1 tablet     Vitamin D, Ergocalciferol, (DRISDOL) 1.25 MG (50000 UNIT) CAPS capsule Take 50,000 Units by mouth every 7 (seven) days.     No current facility-administered medications for this visit.    REVIEW OF SYSTEMS:   Constitutional: ( - ) fevers, ( - )  chills , ( - ) night sweats Eyes: ( - ) blurriness of vision, ( - ) double vision, ( - ) watery eyes Ears, nose, mouth, throat, and face: ( - ) mucositis, ( -  ) sore throat Respiratory: ( - ) cough, ( - ) dyspnea, ( - ) wheezes Cardiovascular: ( - ) palpitation, ( - ) chest discomfort, ( - ) lower extremity swelling Gastrointestinal:  ( - ) nausea, ( - ) heartburn, ( - ) change in bowel habits Skin: ( - ) abnormal skin rashes Lymphatics: ( - ) new lymphadenopathy, ( - ) easy bruising Neurological: ( - ) numbness, ( - )  tingling, ( - ) new weaknesses Behavioral/Psych: ( - ) mood change, ( - ) new changes  All other systems were reviewed with the patient and are negative.  PHYSICAL EXAMINATION:  Vitals:   09/11/21 1020  BP: (!) 164/92  Pulse: 65  Resp: 18  Temp: (!) 96.7 F (35.9 C)  SpO2: 100%   Filed Weights   09/11/21 1020  Weight: 248 lb 5 oz (112.6 kg)    GENERAL: alert, no distress and comfortable SKIN: skin color, texture, turgor are normal, no rashes or significant lesions EYES: conjunctiva are pink and non-injected, sclera clear LUNGS: clear to auscultation and percussion with normal breathing effort HEART: regular rate & rhythm and no murmurs and no lower extremity edema Musculoskeletal: no cyanosis of digits and no clubbing  PSYCH: alert & oriented x 3, fluent speech NEURO: no focal motor/sensory deficits  LABORATORY DATA:  I have reviewed the data as listed CBC Latest Ref Rng & Units 09/11/2021 06/10/2021 03/25/2021  WBC 4.0 - 10.5 K/uL 7.8 7.3 4.6  Hemoglobin 12.0 - 15.0 g/dL 12.2 9.6(L) 12.3  Hematocrit 36.0 - 46.0 % 36.9 31.1(L) 38.2  Platelets 150 - 400 K/uL 212 304 238    CMP Latest Ref Rng & Units 09/11/2021 07/31/2021 06/10/2021  Glucose 70 - 99 mg/dL 89 85 81  BUN 8 - 23 mg/dL 13 11 11   Creatinine 0.44 - 1.00 mg/dL 1.13(H) 0.98 1.23(H)  Sodium 135 - 145 mmol/L 138 139 142  Potassium 3.5 - 5.1 mmol/L 3.6 4.0 3.9  Chloride 98 - 111 mmol/L 103 104 106  CO2 22 - 32 mmol/L 30 23 27   Calcium 8.9 - 10.3 mg/dL 9.3 8.9 9.4  Total Protein 6.5 - 8.1 g/dL 8.7(H) 8.2(H) 8.3(H)  Total Bilirubin 0.3 - 1.2 mg/dL 0.7 0.5  0.7  Alkaline Phos 38 - 126 U/L 74 - 92  AST 15 - 41 U/L 27 19 20   ALT 0 - 44 U/L 20 10 13     Lab Results  Component Value Date   MPROTEIN Not Observed 06/10/2021   Lab Results  Component Value Date   KPAFRELGTCHN 86.6 (H) 06/10/2021   KPAFRELGTCHN 105.0 (H) 06/29/2018   LAMBDASER 47.9 (H) 06/10/2021   LAMBDASER 75.2 (H) 06/29/2018   KAPLAMBRATIO 7.47 06/12/2021   KAPLAMBRATIO 1.81 (H) 06/10/2021   KAPLAMBRATIO 1.40 06/29/2018   RADIOGRAPHIC STUDIES: No results found.  ASSESSMENT & PLAN Laura Jimenez 71 y.o. female with medical history significant for elevated serum protein and anemia who presents for a follow up visit.  #Elevated serum protein #Normocytic anemia-improved --Recommend continuing ferrous sulfate 325 mg p.o. daily in order to help bolster her iron levels. Hgb returned to normal levels today. Hgb 12.2, MCV 97.1, Plt 212  --Due to concern for elevated protein we ordered SPEP, UPEP, and serum free light chains as well as beta-2 microglobulin and LDH. No evidence of monoclonal gammopathy.  --there may have been a component of hypothyroidism contributing to her low blood counts.  --RTC PRN   #Lymphadenopathy -- Previously seen by Dr. Delton Coombes for lymphadenopathy.  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   All questions were answered. The patient knows to call the clinic with any problems, questions or concerns.  A total of more than 30 minutes were spent on this encounter with face-to-face time and non-face-to-face time, including preparing to see the patient, ordering tests and/or  medications, counseling the patient and coordination of care as outlined above.   Ledell Peoples, MD Department of Hematology/Oncology [place] [facility] Phone: [phone] Pager: [phone] Email:  [email or web address]  09/22/2021 1:35 PM

## 2021-10-01 DIAGNOSIS — E038 Other specified hypothyroidism: Secondary | ICD-10-CM | POA: Diagnosis not present

## 2021-10-01 DIAGNOSIS — E063 Autoimmune thyroiditis: Secondary | ICD-10-CM | POA: Diagnosis not present

## 2021-10-06 NOTE — Progress Notes (Signed)
Subjective:  Chief complaint: She is here for follow-up for HIV disease on a partially active regimen   Patient ID: Laura Jimenez, female    DOB: 01/01/1951, 71 y.o.   MRN: [id number]  HPI   Laura Jimenez is a 71 year-old African-American lady diagnosed with HIV in the 1990s who has a history of multidrug-resistant HIV and intermittent adherence to medications.   While she was at [facility] she was on a regimen of twice daily boosted darunavir (prezista) with norvir along with emtricitabine and isentress BID and with VL <20 fairly consistently in 2016 period, then changed from [place] to [facility] for simplification of her regimen with reduction of pill burden and also giving her an integrase strand transfer inhibitor with higher barrier to resistance.   The patient has told us that she done developed a rash while on TIVICAY.  Certainly I cannot find documentation of her having developed a rash when she was switched to [facility] and she remained undetectable through several visits on this regimen of TIVICAY once daily Emtricitabine  and boosted darunavir ritonavir twice daily.   She is subsequently transferred care [facility] and she is largely been viremic although she did get a viral load down to 653 and November 29 of 2021.   There was a very nice note written at [facility] which Dr. Gale Journey had put into the chart which summarize all of the resistance mutations over time that the patient had documented since 2007.   I put those mutations back into [employer] and the results are below.        Drug resistance interpretation: PR   HIVDB 9.0 (2019-10-17) PI Major Resistance Mutations:   M46I, I54V, I84V, L90IMP   PI Accessory Resistance Mutations : G73S Other Mutations: L10I, L63P, A71V, V77I   Protease Inhibitors atazanavir/r (ATV/r)    High-Level Resistance darunavir/r (DRV/r)     Low-Level  Resistance lopinavir/r (LPV/r)        High-Level Resistance     PR comments PI Major M46I/L are relatively non-polymorphic PI-selected mutations. In combination with other PI-resistance mutations, they are associated with reduced susceptibility to each of the PIs except DRV. I54V is a non-polymorphic PI-selected mutation that contributes reduced susceptibility to each of the PIs except DRV. I84V is a non-polymorphic mutation selected by each of the PIs. It causes high-level resistance to ATV, FPV, IDV, NFV and SQV, intermediate resistance to LPV and TPV, and low-level resistance to DRV. L90M is a non-polymorphic PI-selected mutation that reduces susceptibility to each of the PIs except TPV and DRV. PI Accessory G73S/T/C/A are non-polymorphic accessory PI-selected mutations. They are associated primarily with reduced susceptibility to ATV, SQV, FPV, IDV, and NFV. G73V/D are rare non-polymorphic PI-selected mutations. Other L10I/V are polymorphic, PI-selected accessory mutations that increase the replication of viruses with other PI-resistance mutations. A71V/T are polymorphic, PI-selected accessory mutations that increase the replication of viruses with other PI-resistance mutations. L90M is a non-polymorphic PI-selected mutation which reduces susceptibility to each of the PIs except TPV and DRV. L90IP is a highly unusual mutation at this position. Dosage Considerations There is evidence for low-level DRV resistance. If DRV is administered it should be used twice daily.   Mutation scoring: PR HIVDB 9.0 (2019-10-17) PI         ATV/r   DRV/r  LPV/r M46I    10        0  10 M46I + I84V + L90IMP            5          0          5 M46I + L90IMP           10        0          0 I54V     15        0          15 I54V + L90IMP            10        0          5 G73S   10        0          5 G73S + L90IMP          10        0          0 I84V     60        15        30 L90IMP            25         0          15 Total    155      15        85   Drug resistance interpretation: RT HIVDB 9.0 (2019-10-17)   NRTI Resistance Mutations:   M41L, M184V, L210W, T215Y NNRTI Resistance Mutations: K103N, Y181C, G190A   Other Mutations: None     Nucleoside Reverse Transcriptase Inhibitors abacavir (ABC)           High-Level Resistance zidovudine (AZT)        High-Level Resistance emtricitabine (FTC)     High-Level Resistance lamivudine (3TC)        High-Level Resistance tenofovir (TDF)            Intermediate Resistance Non-nucleoside Reverse Transcriptase Inhibitors doravirine (DOR)         Intermediate Resistance efavirenz (EFV)           High-Level Resistance etravirine (ETR)          Intermediate Resistance nevirapine (NVP)        High-Level Resistance rilpivirine (RPV)           High-Level Resistance     RT comments NRTI M41L is a TAM that usually occurs with T215Y. In combination, M41L plus T215Y confer intermediate / high-level resistance to AZT and d4T and contribute to reduced ddI, ABC and TDF susceptibility. M184V/I cause high-level in vitro resistance to 3TC and FTC and low-level resistance to ddI and ABC. However, M184V/I are not contraindications to continued treatment with 3TC or FTC because they increase susceptibility to AZT, TDF and d4T and are associated with clinically significant reductions in HIV-1 replication. L210W is a TAM that usually occurs in combination with M41L and T215Y. The combination of M41, L210W and T215Y causes high-level resistance to AZT and d4T and intermediate to high-level resistance to ddI, ABC and TDF. T215Y is a TAM that causes intermediate/high-level resistance to AZT and d4T, low-level resistance to ddI, and potentially low-level resistance to ABC and TDF. NNRTI K103N is a non-polymorphic mutation that causes high-level reductions in NVP and EFV susceptibility. Y181C is a non-polymorphic mutation selected in patients receiving NVP, ETR  and RPV. It reduces susceptibility to  NVP, ETR, RPV, and EFV by >50-fold, 5-fold, 3-fold, and 2-fold, respectively. Although Y181C itself reduces EFV susceptibility by only 2-fold, it has been associated with a reduced response to an EFV-containing regimen in NNRTI-experienced patients. Y181C has a weight of 2.5 in the [employer] ETR genotypic susceptibility score. Alone, it does not appear to reduce DOR susceptibility. G190A is a non-polymorphic mutation that causes high-level resistance to NVP and intermediate resistance to EFV. It has a weight of 1.0 in the [employer] ETR genotypic susceptibility score but does not appear to be selected by ETR or RPV or to reduce their in vitro susceptibility in the absence of other NNRTI-resistance mutations. It also does not appear to reduce DOR susceptibility. Mutation scoring: RT HIVDB 9.0 (2019-10-17) NRTI    ABC     AZT     FTC     3TC     TDF M41L   5          15        0          0          5 M41L + L210W           10        10        0          0          10 M41L + L210W + T215Y        5          0          5          5          5  M41L + T215Y            15        10        5          5          10  M184V 15        -10       60        60        -10 L210W 5          15        0          0          5 L210W + T215Y          10        10        5          5          10  T215Y  10        40        0          0          10 Total    75        90        75        75        45 NNRTI DOR    EFV     ETR     NVP     RPV K103N + Y181C          10        0          0  0          0 Y181C 10        30        30         60        45 Y181C + G190A          20        0          10        0          10 K103N 0          60        0          60        0 G190A 0          45        10        60        15 Total    40        135      50        180      70     She appeared previously  to frequently only take the boosted protease inhibitor not take the [facility] because she was  concerned that is causing the rash that she has.  Dr. Gale Journey has been skeptical of this and I am as well.   She was scheduled for a biopsy with dermatology in April.    She was scheduled with me for the first time to consider different regimens and options for her to try to improve her adherence.   She was accompanied by her daughter to clinic  They again detailed the issues she has with esophageal stricture.  She does crush both the Prezista and the Norvir but is able to take the [facility] without difficulty.   I checked with Patrick Jupiter D and he had informed me that Norvir should not be crushed due to it solubility issues.  Endeavored to procure powdered no Norvir and I sent prescriptions to [facility] for this along with a prescription for DESCOVY which I thought she really needed added to her regimen.  It turns out that scripts were then converted over to her pharmacy that she prefers and she came to clinic with medications from there.  However the that pharmacy also did not have powdered NORVIR the prescription is written as if it is powder NORVIR that can be dissolved but clearly on examining the tablets they are not powdered nor dissolvable.  I had extensive discussion with Butch Penny our pharmacy tech and it looks as if [facility] cannot procure NORVIR powdered packs and we could not obtain it.  The patient in the interim has been taking her medications with crushed Prezista twice daily along with crushed Norvir twice daily TIVICAY once daily and DESCOVY once daily.  In follow-up she had remained suppressed based on her viral loads from March through August of 2022.  She also had been tolerating DESCOVY quite well.   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  She was also treated for cystitis.  She had a transient elevation in transaminases which resolved.  Has been followed by Dr.  Barton Dubois at [facility] with endocrinology.  The patient is told me that she developed eye swelling and nausea and malaise while on a new medication for her thyroid.  She said she stopped this medication and also stopped the DESCOVY because she thought that it also might be to blame.  She had quite a bit of concern about potential liver toxicity from medications though she has had no evidence of hepatotoxicity from her antiretrovirals and had normal transaminases here in the clinic and more recently at discharge when she was hospitalized at [facility].  Of note her viral load while an inpatient was 143 on April 28, 2021 at [place] with [name] and come and is up in the 300s.  She saw Cassie Kuppelweiser   in follow-up who had further discussions with her about DESCOVY and also but the idea of  lenacapravir which she would need oral loading dose and SQ q 6 months Past Medical History:  Diagnosis Date   Anxiety    Arthritis    Cancer ([place])    CVA (cerebral infarction)    Eye swelling 07/31/2021   GERD (gastroesophageal reflux disease)    HIV infection ([place])    HTN (hypertension)    Hyperlipidemia    Morbid obesity with BMI of 40.0-44.9, adult ([place])    Postmenopausal bleeding    Stroke ([place])    no residual paralysis    Past Surgical History:  Procedure Laterality Date   COLONOSCOPY  2000   HYSTEROSCOPY WITH D & C N/A 04/19/2013   Procedure: DILATATION AND CURETTAGE /HYSTEROSCOPY;  Surgeon: Emily Filbert, MD;  Location: [facility];  Service: Gynecology;  Laterality: N/A;   LYMPH NODE BIOPSY Right 09/17/2020   Procedure: LYMPH NODE BIOPSY; INGUINAL;  Surgeon: Virl Cagey, MD;  Location: [facility];  Service: General;  Laterality: Right;    Family History  Problem Relation Age of Onset   Hypertension Mother    Hypertension  Sister    Arthritis Sister    Cancer Other    Diabetes Other       Social History   Socioeconomic History   Marital status: Widowed    Spouse name: Not on file   Number of children: Not on file   Years of education: Not on file   Highest education level: 10th grade  Occupational History   Not on file  Tobacco Use   Smoking status: Former    Packs/day: 1.00    Years: 5.00    Pack years: 5.00    Types: Cigarettes   Smokeless tobacco: Never  Vaping Use   Vaping Use: Never used  Substance and Sexual Activity   Alcohol use: No   Drug use: No   Sexual activity: Not Currently    Birth control/protection: None, Post-menopausal  Other Topics Concern   Not on file  Social History Narrative   Grew up in [place], [place].   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   2 children, boy and girl. Son is 13s, and special needs.   Daughter in her 20s, is a Marine scientist.   Eats all food groups.   Wears seatbelt.   Attends church.   Enjoys watching tv, listening to [employer], and spending time with friends.   Enjoys going out to eat.    Social Determinants of Health   Financial Resource Strain: Not on file  Food Insecurity: Not on file  Transportation Needs: Not on file  Physical  Activity: Not on file  Stress: Not on file  Social Connections: Not on file    Allergies  Allergen Reactions   Gadolinium Derivatives Nausea And Vomiting    Vomited with multihance 12/05/19 Vomited with multihance 12/05/19   Amlodipine Other (See Comments)   Ivp Dye [Iodinated Contrast Media] Other (See Comments)    Caused kidney damage   Penicillins Swelling   Tape Dermatitis   Cephalexin Rash   Latex Rash     Current Outpatient Medications:    acetaminophen (TYLENOL) 325 MG tablet, , Disp: , Rfl:    allopurinol (ZYLOPRIM) 100 MG tablet, 1 tablet, Disp: , Rfl:    aspirin (PX ENTERIC ASPIRIN) 81 MG EC tablet, Take 1 tablet (81 mg total) by mouth daily. Swallow whole., Disp: 30 tablet,  Rfl: 12   darunavir (PREZISTA) 600 MG tablet, TAKE 1 TABLET (600 MG TOTAL) BY MOUTH 2 (TWO) TIMES DAILY WITH A MEAL. CRUSH AND TAKE BID WITH NORVIR POWDER PACKETS BID, Disp: 60 tablet, Rfl: 11   dolutegravir (TIVICAY) 50 MG tablet, TAKE 1 TABLET (50 MG TOTAL) BY MOUTH DAILY., Disp: 30 tablet, Rfl: 11   ferrous sulfate 325 (65 FE) MG tablet, Take 325 mg by mouth daily with breakfast., Disp: , Rfl:    ibuprofen (ADVIL) 200 MG tablet, Take 200 mg by mouth every 8 (eight) hours as needed for moderate pain., Disp: , Rfl:    levothyroxine (SYNTHROID) 25 MCG tablet, Take 25 mcg by mouth every morning. (Patient not taking: Reported on 07/31/2021), Disp: , Rfl:    metoprolol succinate (TOPROL-XL) 25 MG 24 hr tablet, Take 25 mg by mouth at bedtime., Disp: , Rfl:    olmesartan (BENICAR) 20 MG tablet, Take 40 mg by mouth daily., Disp: , Rfl:    pantoprazole (PROTONIX) 40 MG tablet, Take 40 mg by mouth daily., Disp: , Rfl:    PREZISTA 600 MG tablet, Take by mouth., Disp: , Rfl:    ritonavir (NORVIR) 100 MG TABS tablet, Take 1 tablet (100 mg total) by mouth 2 (two) times daily with a meal., Disp: 60 tablet, Rfl: 11   rosuvastatin (CRESTOR) 20 MG tablet, SMARTSIG:1 Tablet(s) By Mouth Every Evening, Disp: , Rfl:    tetrahydrozoline 0.05 % ophthalmic solution, Place 1 drop into both eyes daily as needed (red eyes)., Disp: , Rfl:    triamterene-hydrochlorothiazide (MAXZIDE-25) 37.5-25 MG tablet, Take 1 tablet by mouth daily., Disp: , Rfl:    vitamin C (ASCORBIC ACID) 500 MG tablet, 1 tablet, Disp: , Rfl:    Vitamin D, Ergocalciferol, (DRISDOL) 1.25 MG (50000 UNIT) CAPS capsule, Take 50,000 Units by mouth every 7 (seven) days., Disp: , Rfl:     Review of Systems  Constitutional:  Negative for activity change, appetite change, chills, diaphoresis, fatigue, fever and unexpected weight change.  HENT:  Negative for congestion, rhinorrhea, sinus pressure, sneezing, sore throat and trouble swallowing.   Eyes:  Negative  for photophobia and visual disturbance.  Respiratory:  Negative for cough, chest tightness, shortness of breath, wheezing and stridor.   Cardiovascular:  Negative for chest pain, palpitations and leg swelling.  Gastrointestinal:  Negative for abdominal distention, abdominal pain, anal bleeding, blood in stool, constipation, diarrhea, nausea and vomiting.  Genitourinary:  Negative for difficulty urinating, dysuria, flank pain and hematuria.  Musculoskeletal:  Negative for arthralgias, back pain, gait problem, joint swelling and myalgias.  Skin:  Negative for color change, pallor, rash and wound.  Neurological:  Negative for dizziness, tremors, weakness and light-headedness.  Hematological:  Negative for adenopathy. Does not bruise/bleed easily.  Psychiatric/Behavioral:  Negative for agitation, behavioral problems, confusion, decreased concentration, dysphoric mood and sleep disturbance.       Objective:   Physical Exam Constitutional:      General: She is not in acute distress.    Appearance: She is not diaphoretic.  HENT:     Head: Normocephalic and atraumatic.     Right Ear: External ear normal.     Left Ear: External ear normal.     Nose: Nose normal.     Mouth/Throat:     Pharynx: No oropharyngeal exudate.  Eyes:     General: No scleral icterus.       Right eye: No discharge.        Left eye: No discharge.     Extraocular Movements: Extraocular movements intact.     Conjunctiva/sclera: Conjunctivae normal.  Cardiovascular:     Rate and Rhythm: Normal rate and regular rhythm.     Heart sounds:    No friction rub.  Pulmonary:     Effort: Pulmonary effort is normal. No respiratory distress.     Breath sounds: No wheezing or rales.  Abdominal:     General: There is no distension.     Palpations: Abdomen is soft.     Tenderness: There is no rebound.  Musculoskeletal:        General: No tenderness. Normal range of motion.     Cervical back: Normal range of motion and neck  supple.  Lymphadenopathy:     Cervical: No cervical adenopathy.  Skin:    General: Skin is warm and dry.     Coloration: Skin is not jaundiced or pale.     Findings: No erythema, lesion or rash.  Neurological:     General: No focal deficit present.     Mental Status: She is alert and oriented to person, place, and time.     Coordination: Coordination normal.  Psychiatric:        Attention and Perception: Attention normal.        Mood and Affect: Mood normal. Affect is flat.        Speech: Speech is delayed.        Behavior: Behavior normal.        Thought Content: Thought content normal.        Cognition and Memory: Cognition normal.        Judgment: Judgment normal.          A/P  Multi drug resistant HIV.  It really would be more optimal if she had DESCOVY on board but she is refusing to add at this point in time she wants to see how she adjusts to her thyroid medicine first.  In the interim she will continue on Prezista 600 boosted with Norvir 100 mg twice daily along with TIVICAY once daily.  We will endeavor to procure Lenacapravir to give to her and since it would be SQ would avoid need for pills  Could even consider idea though it is certainly off label and even unstudied of IM Cabenuva (which would have activity with CAB) + LEN    I worry about [place] alone in her with her propensity to stop and start meds  HTN: poorly controlled, needs followup with PCP  Vitals:   10/07/21 1346  BP: (!) 181/96  Pulse: 67  SpO2: 98%     Hypothyroidism started back on thyroid medicines.

## 2021-10-07 ENCOUNTER — Other Ambulatory Visit: Payer: Self-pay

## 2021-10-07 ENCOUNTER — Ambulatory Visit (INDEPENDENT_AMBULATORY_CARE_PROVIDER_SITE_OTHER): Payer: Medicare Other | Admitting: Infectious Disease

## 2021-10-07 ENCOUNTER — Encounter: Payer: Self-pay | Admitting: Infectious Disease

## 2021-10-07 VITALS — BP 181/96 | HR 67 | Wt 248.0 lb

## 2021-10-07 DIAGNOSIS — I639 Cerebral infarction, unspecified: Secondary | ICD-10-CM | POA: Diagnosis not present

## 2021-10-07 DIAGNOSIS — E039 Hypothyroidism, unspecified: Secondary | ICD-10-CM

## 2021-10-07 DIAGNOSIS — I10 Essential (primary) hypertension: Secondary | ICD-10-CM

## 2021-10-07 DIAGNOSIS — B20 Human immunodeficiency virus [HIV] disease: Secondary | ICD-10-CM | POA: Diagnosis not present

## 2021-10-07 MED ORDER — RITONAVIR 100 MG PO TABS: 100.0000 mg | ORAL_TABLET | Freq: Two times a day (BID) | ORAL | 11 refills | Status: DC

## 2021-10-07 MED ORDER — PREZISTA 600 MG PO TABS: 600.0000 mg | ORAL_TABLET | Freq: Two times a day (BID) | ORAL | 11 refills | Status: DC

## 2021-10-07 MED ORDER — DOLUTEGRAVIR SODIUM 50 MG PO TABS: ORAL_TABLET | Freq: Every day | ORAL | 11 refills | Status: AC

## 2021-10-17 LAB — T4, FREE: Free T4: 0.3 ng/dL — ABNORMAL LOW (ref 0.8–1.8)

## 2021-10-17 LAB — CBC WITH DIFFERENTIAL/PLATELET
Absolute Monocytes: 371 cells/uL (ref 200–950)
Basophils Absolute: 71 cells/uL (ref 0–200)
Basophils Relative: 1.5 %
Eosinophils Absolute: 61 cells/uL (ref 15–500)
Eosinophils Relative: 1.3 %
HCT: 34.3 % — ABNORMAL LOW (ref 35.0–45.0)
Hemoglobin: 11.2 g/dL — ABNORMAL LOW (ref 11.7–15.5)
Lymphs Abs: 3285 cells/uL (ref 850–3900)
MCH: 31.8 pg (ref 27.0–33.0)
MCHC: 32.7 g/dL (ref 32.0–36.0)
MCV: 97.4 fL (ref 80.0–100.0)
Monocytes Relative: 7.9 %
Neutro Abs: 912 cells/uL — ABNORMAL LOW (ref 1500–7800)
Neutrophils Relative %: 19.4 %
RBC: 3.52 10*6/uL — ABNORMAL LOW (ref 3.80–5.10)
RDW: 13.7 % (ref 11.0–15.0)
Total Lymphocyte: 69.9 %
WBC: 4.7 10*3/uL (ref 3.8–10.8)

## 2021-10-17 LAB — TSH+FREE T4: TSH W/REFLEX TO FT4: 83.99 mIU/L — ABNORMAL HIGH (ref 0.40–4.50)

## 2021-10-17 LAB — COMPLETE METABOLIC PANEL WITH GFR
AG Ratio: 0.8 (calc) — ABNORMAL LOW (ref 1.0–2.5)
ALT: 11 U/L (ref 6–29)
AST: 22 U/L (ref 10–35)
Albumin: 3.8 g/dL (ref 3.6–5.1)
Alkaline phosphatase (APISO): 60 U/L (ref 37–153)
BUN/Creatinine Ratio: 8 (calc) (ref 6–22)
BUN: 10 mg/dL (ref 7–25)
CO2: 28 mmol/L (ref 20–32)
Calcium: 9.1 mg/dL (ref 8.6–10.4)
Chloride: 105 mmol/L (ref 98–110)
Creat: 1.22 mg/dL — ABNORMAL HIGH (ref 0.50–1.05)
Globulin: 4.8 g/dL (calc) — ABNORMAL HIGH (ref 1.9–3.7)
Glucose, Bld: 77 mg/dL (ref 65–99)
Potassium: 3.7 mmol/L (ref 3.5–5.3)
Sodium: 140 mmol/L (ref 135–146)
Total Bilirubin: 0.7 mg/dL (ref 0.2–1.2)
Total Protein: 8.6 g/dL — ABNORMAL HIGH (ref 6.1–8.1)
eGFR: 48 mL/min/{1.73_m2} — ABNORMAL LOW (ref >=60)

## 2021-10-17 LAB — T-HELPER CELLS (CD4) COUNT (NOT AT ARMC)
Absolute CD4: 427 cells/uL — ABNORMAL LOW (ref 490–1740)
CD4 T Helper %: 14 % — ABNORMAL LOW (ref 30–61)
Total lymphocyte count: 3023 cells/uL (ref 850–3900)

## 2021-10-17 LAB — RPR: RPR Ser Ql: NONREACTIVE

## 2021-10-17 LAB — HIV RNA, RTPCR W/R GT (RTI, PI,INT)
HIV 1 RNA Quant: 389 copies/mL — ABNORMAL HIGH
HIV-1 RNA Quant, Log: 2.59 Log copies/mL — ABNORMAL HIGH

## 2021-11-01 ENCOUNTER — Ambulatory Visit: Payer: Medicare Other | Admitting: Infectious Disease

## 2021-11-18 DIAGNOSIS — E782 Mixed hyperlipidemia: Secondary | ICD-10-CM | POA: Diagnosis not present

## 2021-11-18 DIAGNOSIS — I10 Essential (primary) hypertension: Secondary | ICD-10-CM | POA: Diagnosis not present

## 2021-11-18 DIAGNOSIS — E039 Hypothyroidism, unspecified: Secondary | ICD-10-CM | POA: Diagnosis not present

## 2021-12-18 ENCOUNTER — Ambulatory Visit (INDEPENDENT_AMBULATORY_CARE_PROVIDER_SITE_OTHER): Payer: Medicare Other | Admitting: Infectious Disease

## 2021-12-18 ENCOUNTER — Other Ambulatory Visit: Payer: Self-pay

## 2021-12-18 VITALS — BP 174/99 | HR 64 | Resp 16 | Ht 64.0 in | Wt 246.0 lb

## 2021-12-18 DIAGNOSIS — I10 Essential (primary) hypertension: Secondary | ICD-10-CM | POA: Diagnosis not present

## 2021-12-18 DIAGNOSIS — E782 Mixed hyperlipidemia: Secondary | ICD-10-CM

## 2021-12-18 DIAGNOSIS — Z91199 Patient's noncompliance with other medical treatment and regimen due to unspecified reason: Secondary | ICD-10-CM

## 2021-12-18 DIAGNOSIS — E039 Hypothyroidism, unspecified: Secondary | ICD-10-CM | POA: Diagnosis not present

## 2021-12-18 DIAGNOSIS — B20 Human immunodeficiency virus [HIV] disease: Secondary | ICD-10-CM

## 2021-12-18 DIAGNOSIS — I639 Cerebral infarction, unspecified: Secondary | ICD-10-CM | POA: Diagnosis not present

## 2021-12-18 NOTE — Progress Notes (Signed)
? ?Subjective:  ?Chief complaint: My blood pressure is not well controlled ? ? Patient ID: Laura Jimenez, female    DOB: December 20, 1950, 71 y.o.   MRN: [id number] ? ?HPI  ? ?Laura Jimenez is a 71 year-old African-American lady diagnosed with HIV in the 1990s who has a history of multidrug-resistant HIV and intermittent adherence to medications. ?  ?While she was at [facility] she was on a regimen of twice daily boosted darunavir (prezista) with norvir along with emtricitabine and isentress BID and with VL <20 fairly consistently in 2016 period, then changed from [facility] to [facility] for simplification of her regimen with reduction of pill burden and also giving her an integrase strand transfer inhibitor with higher barrier to resistance. ?  ?The patient has told us that she done developed a rash while on TIVICAY.  Certainly I cannot find documentation of her having developed a rash when she was switched to [facility] and she remained undetectable through several visits on this regimen of TIVICAY once daily Emtricitabine  and boosted darunavir ritonavir twice daily. ?  ?She is subsequently transferred care [facility] and she is largely been viremic although she did get a viral load down to 653 and November 29 of 2021. ?  ?There was a very nice note written at [facility] which Dr. Gale Journey had put into the chart which summarize all of the resistance mutations over time that the patient had documented since 2007. ?  ?I put those mutations back into [employer] and the results are below. ? ?  ?  ?  ?Drug resistance interpretation: PR ?  ?HIVDB 9.0 (2019-10-17) ?PI Major Resistance Mutations: ?  ?M46I, I54V, I84V, L90IMP ?  ?PI Accessory Resistance Mutations ?: ?G73S ?Other Mutations: ?L10I, L63P, A71V, V77I ?  ?Protease Inhibitors ?atazanavir/r (ATV/r)    High-Level Resistance ?darunavir/r (DRV/r)     Low-Level Resistance ?lopinavir/r (LPV/r)        High-Level  Resistance ?e daily. ?   ?Drug resistance interpretation: RT ?HIVDB 9.0 (2019-10-17) ?  ?NRTI Resistance Mutations: ?  ?M41L, M184V, L210W, T215Y ?NNRTI Resistance Mutations: ?K103N, Y181C, G190A ?  ?Other Mutations: ?None ?  ?  ?Nucleoside Reverse Transcriptase Inhibitors ?abacavir (ABC)           High-Level Resistance ?zidovudine (AZT)        High-Level Resistance ?emtricitabine (FTC)     High-Level Resistance ?lamivudine (3TC)        High-Level Resistance ?tenofovir (TDF)            Intermediate Resistance ?Non-nucleoside Reverse Transcriptase Inhibitors ?doravirine (DOR)         Intermediate Resistance ?efavirenz (EFV)           High-Level Resistance ?etravirine (ETR)          Intermediate Resistance ?nevirapine (NVP)        High-Level Resistance ?rilpivirine (RPV)           High-Level Resistance ?  ?Laura Jimenez continues to not take the DESCOVY which she blames for some side effects that are clearly not related to this drug. ? ?She also continues to take Prezista 600 mg with crushed 100 mg Norco tablets twice daily along with TIVICAY once daily.  She is not taking any of her blood pressure medications based on report given to [place]. ? ?She tells me that if her blood pressure cannot be controlled by 3 medicines she will not take more and that it must  be because of the HIV medicines that she also wants to stop now. ? ? ?   ?Past Medical History:  ?Diagnosis Date  ? Anxiety   ? Arthritis   ? Cancer [facility])   ? CVA (cerebral infarction)   ? Eye swelling 07/31/2021  ? GERD (gastroesophageal reflux disease)   ? HIV infection ([place])   ? HTN (hypertension)   ? Hyperlipidemia   ? Morbid obesity with BMI of 40.0-44.9, adult ([place])   ? Postmenopausal bleeding   ? Stroke [facility])   ? no residual paralysis  ? ? ?Past Surgical History:  ?Procedure Laterality Date  ? COLONOSCOPY  2000  ? HYSTEROSCOPY WITH D & C N/A 04/19/2013  ? Procedure: DILATATION AND CURETTAGE /HYSTEROSCOPY;  Surgeon: Emily Filbert, MD;  Location: [facility];  Service:  Gynecology;  Laterality: N/A;  ? LYMPH NODE BIOPSY Right 09/17/2020  ? Procedure: LYMPH NODE BIOPSY; INGUINAL;  Surgeon: Virl Cagey, MD;  Location: [facility];  Service: General;  Laterality: Right;  ? ? ?Family History  ?Problem Relation Age of Onset  ? Hypertension Mother   ? Hypertension Sister   ? Arthritis Sister   ? Cancer Other   ? Diabetes Other   ? ? ?  ?Social History  ? ?Socioeconomic History  ? Marital status: Widowed  ?  Spouse name: Not on file  ? Number of children: Not on file  ? Years of education: Not on file  ? Highest education level: 10th grade  ?Occupational History  ? Not on file  ?Tobacco Use  ? Smoking status: Former  ?  Packs/day: 1.00  ?  Years: 5.00  ?  Pack years: 5.00  ?  Types: Cigarettes  ? Smokeless tobacco: Never  ?Vaping Use  ? Vaping Use: Never used  ?Substance and Sexual Activity  ? Alcohol use: No  ? Drug use: No  ? Sexual activity: Not Currently  ?  Birth control/protection: None, Post-menopausal  ?Other Topics Concern  ? Not on file  ?Social History Narrative  ? Grew up in [place], [place].  ? 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  ? Widowed.  ? 2 children, boy and girl. Son is 64s, and special needs.  ? Daughter in her 22s, is a Marine scientist.  ? Eats all food groups.  ? Wears seatbelt.  ? Attends church.  ? Enjoys watching tv, listening to [employer], and spending time with friends.  ? Enjoys going out to eat.   ? ?Social Determinants of Health  ? ?Financial Resource Strain: Not on file  ?Food Insecurity: Not on file  ?Transportation Needs: Not on file  ?Physical Activity: Not on file  ?Stress: Not on file  ?Social Connections: Not on file  ? ? ?Allergies  ?Allergen Reactions  ? Gadolinium Derivatives Nausea And Vomiting  ?  Vomited with multihance 12/05/19 ?Vomited with multihance 12/05/19  ? Amlodipine Other (See Comments)  ? Ivp Dye [Iodinated Contrast Media] Other (See Comments)  ?  Caused kidney damage  ? Penicillins Swelling  ? Tape Dermatitis  ? Cephalexin Rash  ? Latex  Rash  ? ? ? ?Current Outpatient Medications:  ?  acetaminophen (TYLENOL) 325 MG tablet, , Disp: , Rfl:  ?  allopurinol (ZYLOPRIM) 100 MG tablet, 1 tablet, Disp: , Rfl:  ?  aspirin (PX ENTERIC ASPIRIN) 81 MG EC tablet, Take 1 tablet (81 mg total) by mouth daily. Swallow whole., Disp: 30 tablet, Rfl: 12 ?  dolutegravir (TIVICAY) 50 MG tablet, TAKE 1  TABLET (50 MG TOTAL) BY MOUTH DAILY., Disp: 30 tablet, Rfl: 11 ?  ferrous sulfate 325 (65 FE) MG tablet, Take 325 mg by mouth daily with breakfast., Disp: , Rfl:  ?  ibuprofen (ADVIL) 200 MG tablet, Take 200 mg by mouth every 8 (eight) hours as needed for moderate pain., Disp: , Rfl:  ?  levothyroxine (SYNTHROID) 25 MCG tablet, Take 25 mcg by mouth every morning. (Patient not taking: Reported on 07/31/2021), Disp: , Rfl:  ?  metoprolol succinate (TOPROL-XL) 25 MG 24 hr tablet, Take 25 mg by mouth at bedtime., Disp: , Rfl:  ?  olmesartan (BENICAR) 20 MG tablet, Take 40 mg by mouth daily., Disp: , Rfl:  ?  pantoprazole (PROTONIX) 40 MG tablet, Take 40 mg by mouth daily., Disp: , Rfl:  ?  PREZISTA 600 MG tablet, Take 1 tablet (600 mg total) by mouth 2 (two) times daily with a meal., Disp: 60 tablet, Rfl: 11 ?  ritonavir (NORVIR) 100 MG TABS tablet, Take 1 tablet (100 mg total) by mouth 2 (two) times daily with a meal., Disp: 60 tablet, Rfl: 11 ?  rosuvastatin (CRESTOR) 20 MG tablet, SMARTSIG:1 Tablet(s) By Mouth Every Evening, Disp: , Rfl:  ?  tetrahydrozoline 0.05 % ophthalmic solution, Place 1 drop into both eyes daily as needed (red eyes)., Disp: , Rfl:  ?  triamterene-hydrochlorothiazide (MAXZIDE-25) 37.5-25 MG tablet, Take 1 tablet by mouth daily., Disp: , Rfl:  ?  vitamin C (ASCORBIC ACID) 500 MG tablet, 1 tablet, Disp: , Rfl:  ?  Vitamin D, Ergocalciferol, (DRISDOL) 1.25 MG (50000 UNIT) CAPS capsule, Take 50,000 Units by mouth every 7 (seven) days., Disp: , Rfl:  ? ? ? ?Review of Systems ? ?   ?Objective:  ? Physical Exam ?Constitutional:   ?   General: She is not in  acute distress. ?   Appearance: She is not diaphoretic.  ?HENT:  ?   Head: Normocephalic and atraumatic.  ?   Right Ear: External ear normal.  ?   Left Ear: External ear normal.  ?   Nose: Nose normal.  ?   Mo

## 2021-12-24 DIAGNOSIS — E039 Hypothyroidism, unspecified: Secondary | ICD-10-CM | POA: Diagnosis not present

## 2021-12-24 DIAGNOSIS — E782 Mixed hyperlipidemia: Secondary | ICD-10-CM | POA: Diagnosis not present

## 2021-12-24 DIAGNOSIS — I10 Essential (primary) hypertension: Secondary | ICD-10-CM | POA: Diagnosis not present

## 2021-12-31 DIAGNOSIS — I10 Essential (primary) hypertension: Secondary | ICD-10-CM | POA: Diagnosis not present

## 2022-01-03 DIAGNOSIS — E038 Other specified hypothyroidism: Secondary | ICD-10-CM | POA: Diagnosis not present

## 2022-01-03 DIAGNOSIS — E063 Autoimmune thyroiditis: Secondary | ICD-10-CM | POA: Diagnosis not present

## 2022-01-10 LAB — HIV-1 INTEGRASE GENOTYPE

## 2022-01-10 LAB — HIV RNA, RTPCR W/R GT (RTI, PI,INT)
HIV 1 RNA Quant: 649 copies/mL — ABNORMAL HIGH
HIV-1 RNA Quant, Log: 2.81 Log copies/mL — ABNORMAL HIGH

## 2022-01-10 LAB — HIV-1 GENOTYPE: HIV-1 Genotype: DETECTED — AB

## 2022-02-14 DIAGNOSIS — I10 Essential (primary) hypertension: Secondary | ICD-10-CM | POA: Diagnosis not present

## 2022-02-14 DIAGNOSIS — Z0000 Encounter for general adult medical examination without abnormal findings: Secondary | ICD-10-CM | POA: Diagnosis not present

## 2022-02-14 DIAGNOSIS — M8580 Other specified disorders of bone density and structure, unspecified site: Secondary | ICD-10-CM | POA: Diagnosis not present

## 2022-02-14 DIAGNOSIS — M25512 Pain in left shoulder: Secondary | ICD-10-CM | POA: Diagnosis not present

## 2022-02-14 DIAGNOSIS — E782 Mixed hyperlipidemia: Secondary | ICD-10-CM | POA: Diagnosis not present

## 2022-02-14 DIAGNOSIS — Z23 Encounter for immunization: Secondary | ICD-10-CM | POA: Diagnosis not present

## 2022-02-14 DIAGNOSIS — Z79899 Other long term (current) drug therapy: Secondary | ICD-10-CM | POA: Diagnosis not present

## 2022-02-14 DIAGNOSIS — I700 Atherosclerosis of aorta: Secondary | ICD-10-CM | POA: Diagnosis not present

## 2022-02-14 DIAGNOSIS — E039 Hypothyroidism, unspecified: Secondary | ICD-10-CM | POA: Diagnosis not present

## 2022-02-14 LAB — CMP 10231: EGFR: 53

## 2022-02-14 LAB — MICROALBUMIN / CREATININE URINE RATIO
Albumin, Urine POC: 59.5
Creatinine, Random Urine: 35.1
Microalb Creat Ratio: 170

## 2022-02-26 ENCOUNTER — Ambulatory Visit (INDEPENDENT_AMBULATORY_CARE_PROVIDER_SITE_OTHER): Payer: Medicare Other | Admitting: Orthopaedic Surgery

## 2022-02-26 ENCOUNTER — Encounter: Payer: Self-pay | Admitting: Orthopaedic Surgery

## 2022-02-26 DIAGNOSIS — M19012 Primary osteoarthritis, left shoulder: Secondary | ICD-10-CM | POA: Diagnosis not present

## 2022-02-26 MED ORDER — METHYLPREDNISOLONE ACETATE 40 MG/ML IJ SUSP
80.0000 mg | INTRAMUSCULAR | Status: AC | PRN
  Administered 2022-02-26: 80 mg via INTRA_ARTICULAR

## 2022-02-26 MED ORDER — BUPIVACAINE HCL 0.25 % IJ SOLN
2.0000 mL | INTRAMUSCULAR | Status: AC | PRN
  Administered 2022-02-26: 2 mL via INTRA_ARTICULAR

## 2022-02-26 MED ORDER — LIDOCAINE HCL 1 % IJ SOLN
2.0000 mL | INTRAMUSCULAR | Status: AC | PRN
  Administered 2022-02-26: 2 mL

## 2022-02-26 NOTE — Progress Notes (Signed)
Office Visit Note   Patient: Laura Jimenez           Date of Birth: [birth year]           MRN: [id number] Visit Date: 02/26/2022              Requested by: Caren Macadam, [address] [place],  [place] [id number] PCP: Caren Macadam, MD   Assessment & Plan: Visit Diagnoses:  1. Arthritis of left shoulder region     Plan: Patient is a pleasant 71 year old woman with a history of left shoulder arthritis.  She was last seen in 2020 at which time she received a cortisone injection and found it quite helpful.  She has had the return of sharp aching and increased pain especially at night.  She denies any injury we will follow-up in 2 to 3 weeks if she does not get adequate relief  Follow-Up Instructions: Return if symptoms worsen or fail to improve.   Orders:  Orders Placed This Encounter  Procedures   Large Joint Inj: L glenohumeral   No orders of the defined types were placed in this encounter.     Procedures: Large Joint Inj: L glenohumeral on 02/26/2022 2:22 PM Indications: diagnostic evaluation and pain Details: 25 G 1.5 in needle, anterior approach  Arthrogram: No  Medications: 2 mL lidocaine 1 %; 80 mg methylPREDNISolone acetate 40 MG/ML; 2 mL bupivacaine 0.25 % Outcome: tolerated well, no immediate complications Procedure, treatment alternatives, risks and benefits explained, specific risks discussed. Consent was given by the patient.      Clinical Data: No additional findings.   Subjective: Chief Complaint  Patient presents with   Left Shoulder - New Patient (Initial Visit)   Patient presents today for evaluation of her left shoulder. She states that she has been having left shoulder pain for around 10 years and has gradually been getting worse. Patient was previously given injections which has been helping with her pain. Patient is limited with ROM and overhead reaching motion.   Review of Systems   Objective: Vital Signs: There were no vitals  taken for this visit.  Physical Exam Constitutional:      Appearance: Normal appearance.  Pulmonary:     Effort: Pulmonary effort is normal.  Skin:    General: Skin is warm and dry.  Neurological:     General: No focal deficit present.     Mental Status: She is alert.     Ortho Exam Examination of her left shoulder she has pain with abduction and forward elevation.  Pain is global no specific impingement findings.  She does have some stiffness in her shoulder as well.  Distal circulation is intact.  Distal sensation is intact. Specialty Comments:  No specialty comments available.  Imaging: No results found.   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   History of CVA (cerebrovascular accident) 11/13/2011   Post-menopausal bleeding 11/13/2011   HIV INFECTION 09/08/2006   CANDIDIASIS, VAGINAL 09/08/2006   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  PERIPHERAL NEUROPATHY 09/08/2006   Essential hypertension 09/08/2006   REFLUX ESOPHAGITIS 09/08/2006   OVARIAN CYST 09/08/2006   DYSPLASIA, CERVIX NOS 39/76/7341   FOLLICULITIS 93/79/0240   Enlarged lymph nodes 09/08/2006   PAP SMEAR, ABNORMAL 10/09/2000   Past Medical History:  Diagnosis Date   Anxiety    Arthritis    Cancer ([place])    CVA (cerebral infarction)    Eye swelling 07/31/2021   GERD (gastroesophageal reflux disease)    HIV infection ([facility])    HTN (hypertension)    Hyperlipidemia    Morbid obesity with BMI of 40.0-44.9, adult ([place])     Postmenopausal bleeding    Stroke ([place])    no residual paralysis    Family History  Problem Relation Age of Onset   Hypertension Mother    Hypertension Sister    Arthritis Sister    Cancer Other    Diabetes Other     Past Surgical History:  Procedure Laterality Date   COLONOSCOPY  2000   HYSTEROSCOPY WITH D & C N/A 04/19/2013   Procedure: DILATATION AND CURETTAGE /HYSTEROSCOPY;  Surgeon: Emily Filbert, MD;  Location: [facility];  Service: Gynecology;  Laterality: N/A;   LYMPH NODE BIOPSY Right 09/17/2020   Procedure: LYMPH NODE BIOPSY; INGUINAL;  Surgeon: Virl Cagey, MD;  Location: [facility];  Service: General;  Laterality: Right;   Social History   Occupational History   Not on file  Tobacco Use   Smoking status: Former    Packs/day: 1.00    Years: 5.00    Total pack years: 5.00    Types: Cigarettes   Smokeless tobacco: Never  Vaping Use   Vaping Use: Never used  Substance and Sexual Activity   Alcohol use: No   Drug use: No   Sexual activity: Not Currently    Birth control/protection: None, Post-menopausal

## 2022-03-18 DIAGNOSIS — E039 Hypothyroidism, unspecified: Secondary | ICD-10-CM | POA: Diagnosis not present

## 2022-03-18 DIAGNOSIS — I10 Essential (primary) hypertension: Secondary | ICD-10-CM | POA: Diagnosis not present

## 2022-03-18 DIAGNOSIS — E782 Mixed hyperlipidemia: Secondary | ICD-10-CM | POA: Diagnosis not present

## 2022-03-18 DIAGNOSIS — K219 Gastro-esophageal reflux disease without esophagitis: Secondary | ICD-10-CM | POA: Diagnosis not present

## 2022-03-26 ENCOUNTER — Ambulatory Visit: Payer: Medicare Other | Admitting: Infectious Disease

## 2022-04-03 ENCOUNTER — Ambulatory Visit
Admission: EM | Admit: 2022-04-03 | Discharge: 2022-04-03 | Disposition: A | Discharge status: Home / Self Care | Payer: Medicare Other | Attending: Urgent Care | Admitting: Urgent Care

## 2022-04-03 DIAGNOSIS — R030 Elevated blood-pressure reading, without diagnosis of hypertension: Secondary | ICD-10-CM | POA: Diagnosis not present

## 2022-04-03 DIAGNOSIS — I10 Essential (primary) hypertension: Secondary | ICD-10-CM

## 2022-04-03 DIAGNOSIS — Z8673 Personal history of transient ischemic attack (TIA), and cerebral infarction without residual deficits: Secondary | ICD-10-CM

## 2022-04-03 DIAGNOSIS — R7989 Other specified abnormal findings of blood chemistry: Secondary | ICD-10-CM | POA: Diagnosis not present

## 2022-04-03 DIAGNOSIS — M109 Gout, unspecified: Secondary | ICD-10-CM | POA: Diagnosis not present

## 2022-04-03 DIAGNOSIS — M79674 Pain in right toe(s): Secondary | ICD-10-CM | POA: Diagnosis not present

## 2022-04-03 DIAGNOSIS — M79671 Pain in right foot: Secondary | ICD-10-CM | POA: Diagnosis not present

## 2022-04-03 HISTORY — DX: Gout, unspecified: M10.9

## 2022-04-03 MED ORDER — PREDNISONE 20 MG PO TABS: 40.0000 mg | ORAL_TABLET | Freq: Every day | ORAL | 0 refills | Status: DC

## 2022-04-03 NOTE — ED Provider Notes (Signed)
[place]   MRN: [id number] DOB: May 30, 1951  Subjective:   Laura Jimenez is a 71 y.o. female presenting for 2 day history of acute onset persistent severe right foot pain, swelling. Patient has a history of gout and usually gets a gout attack in this area. Tries to avoid gout causing foods. Does not practice a hypertensive friendly diet. Has a history of cerebrovascular disease, stroke, elevated creatinine levels.   No current facility-administered medications for this encounter.  Current Outpatient Medications:    acetaminophen (TYLENOL) 325 MG tablet, , Disp: , Rfl:    allopurinol (ZYLOPRIM) 100 MG tablet, 1 tablet, Disp: , Rfl:    ascorbic acid (VITAMIN C) 500 MG tablet, Take 1 tablet by mouth daily., Disp: , Rfl:    aspirin (PX ENTERIC ASPIRIN) 81 MG EC tablet, Take 1 tablet (81 mg total) by mouth daily. Swallow whole., Disp: 30 tablet, Rfl: 12   Aspirin-Salicylamide-Caffeine (BC HEADACHE POWDER PO), Take by mouth., Disp: , Rfl:    atorvastatin (LIPITOR) 10 MG tablet, Take 1 tablet by mouth daily., Disp: , Rfl:    dolutegravir (TIVICAY) 50 MG tablet, TAKE 1 TABLET (50 MG TOTAL) BY MOUTH DAILY., Disp: 30 tablet, Rfl: 11   ferrous sulfate 325 (65 FE) MG tablet, Take 325 mg by mouth daily with breakfast., Disp: , Rfl:    ibuprofen (ADVIL) 200 MG tablet, Take 200 mg by mouth every 8 (eight) hours as needed for moderate pain. (Patient not taking: Reported on 12/18/2021), Disp: , Rfl:    levothyroxine (SYNTHROID) 25 MCG tablet, Take 25 mcg by mouth every morning., Disp: , Rfl:    metoprolol succinate (TOPROL-XL) 25 MG 24 hr tablet, Take 25 mg by mouth at bedtime. (Patient not taking: Reported on 12/18/2021), Disp: , Rfl:    olmesartan (BENICAR) 20 MG tablet, Take 40 mg by mouth daily., Disp: , Rfl:    pantoprazole (PROTONIX) 40 MG tablet, Take 40 mg by mouth daily., Disp: , Rfl:    PREZISTA 600 MG tablet, Take 1 tablet (600 mg total) by mouth 2 (two) times daily with a  meal., Disp: 60 tablet, Rfl: 11   ritonavir (NORVIR) 100 MG TABS tablet, Take 1 tablet (100 mg total) by mouth 2 (two) times daily with a meal., Disp: 60 tablet, Rfl: 11   tetrahydrozoline 0.05 % ophthalmic solution, Place 1 drop into both eyes daily as needed (red eyes)., Disp: , Rfl:    triamterene-hydrochlorothiazide (MAXZIDE-25) 37.5-25 MG tablet, Take 1 tablet by mouth daily., Disp: , Rfl:    Vitamin D, Ergocalciferol, (DRISDOL) 1.25 MG (50000 UNIT) CAPS capsule, Take 50,000 Units by mouth every 7 (seven) days., Disp: , Rfl:    Allergies  Allergen Reactions   Gadolinium Derivatives Nausea And Vomiting    Vomited with multihance 12/05/19 Vomited with multihance 12/05/19   Amlodipine Other (See Comments)   Ivp Dye [Iodinated Contrast Media] Other (See Comments)    Caused kidney damage   Penicillins Swelling   Tape Dermatitis   Cephalexin Rash   Latex Rash    Past Medical History:  Diagnosis Date   Anxiety    Arthritis    Cancer ([place])    CVA (cerebral infarction)    Eye swelling 07/31/2021   GERD (gastroesophageal reflux disease)    HIV infection ([facility])    HTN (hypertension)    Hyperlipidemia    Morbid obesity with BMI of 40.0-44.9, adult ([place])    Postmenopausal bleeding    Stroke ([place])    no  residual paralysis     Past Surgical History:  Procedure Laterality Date   COLONOSCOPY  2000   HYSTEROSCOPY WITH D & C N/A 04/19/2013   Procedure: DILATATION AND CURETTAGE /HYSTEROSCOPY;  Surgeon: Emily Filbert, MD;  Location: [facility];  Service: Gynecology;  Laterality: N/A;   LYMPH NODE BIOPSY Right 09/17/2020   Procedure: LYMPH NODE BIOPSY; INGUINAL;  Surgeon: Virl Cagey, MD;  Location: [facility];  Service: General;  Laterality: Right;    Family History  Problem Relation Age of Onset   Hypertension Mother    Hypertension Sister    Arthritis Sister    Cancer Other    Diabetes Other     Social History   Tobacco Use   Smoking status: Former    Packs/day: 1.00    Years:  5.00    Total pack years: 5.00    Types: Cigarettes   Smokeless tobacco: Never  Vaping Use   Vaping Use: Never used  Substance Use Topics   Alcohol use: No   Drug use: No    ROS   Objective:   Vitals: BP (!) 167/101 (BP Location: Right Arm)   Pulse 77   Temp 98.1 F (36.7 C) (Oral)   Resp 16   SpO2 97%   BP Readings from Last 3 Encounters:  04/03/22 (!) 167/101  12/18/21 (!) 174/99  10/07/21 (!) 181/96   Physical Exam Constitutional:      General: She is not in acute distress.    Appearance: Normal appearance. She is well-developed. She is not ill-appearing, toxic-appearing or diaphoretic.  HENT:     Head: Normocephalic and atraumatic.     Nose: Nose normal.     Mouth/Throat:     Mouth: Mucous membranes are moist.  Eyes:     General: No scleral icterus.       Right eye: No discharge.        Left eye: No discharge.     Extraocular Movements: Extraocular movements intact.  Cardiovascular:     Rate and Rhythm: Normal rate.  Pulmonary:     Effort: Pulmonary effort is normal.  Musculoskeletal:     Right foot: Decreased range of motion. Normal capillary refill. Swelling, tenderness and bony tenderness present. No deformity, bunion, laceration or crepitus.       Feet:  Skin:    General: Skin is warm and dry.  Neurological:     General: No focal deficit present.     Mental Status: She is alert and oriented to person, place, and time.     Cranial Nerves: No cranial nerve deficit.     Motor: No weakness.     Coordination: Coordination abnormal.     Gait: Gait abnormal (favoring her right foot).  Psychiatric:        Mood and Affect: Mood normal.        Behavior: Behavior normal.    Assessment and Plan :   PDMP not reviewed this encounter.  1. Acute gout of right foot, unspecified cause   2. Great toe pain, right   3. Right foot pain   4. Essential hypertension   5. Elevated blood pressure reading   6. History of stroke   7. Elevated serum creatinine     Patient requested a steroid injection and then I advised against this.  She has severely elevated blood pressure and is well-controlled.  Has a history of cerebrovascular disease and stroke.  I counseled that steroid injections are high-dose and can raise  her blood pressure higher still beyond her already unsafe levels.  I offered an oral prednisone course as a more reasonable option and emphasized need for hypertensive friendly diet.  There is no fall or trauma and therefore deferred imaging.  Discussed low purine foods as well.  Follow-up with PCP. Counseled patient on potential for adverse effects with medications prescribed/recommended today, ER and return-to-clinic precautions discussed, patient verbalized understanding.    Jaynee Eagles, PA-C 04/03/22 1404

## 2022-04-03 NOTE — Discharge Instructions (Addendum)

## 2022-04-03 NOTE — ED Triage Notes (Signed)
Pt states right foot pain for the past 2 days hx of gout.

## 2022-04-10 DIAGNOSIS — E063 Autoimmune thyroiditis: Secondary | ICD-10-CM | POA: Diagnosis not present

## 2022-04-10 DIAGNOSIS — E038 Other specified hypothyroidism: Secondary | ICD-10-CM | POA: Diagnosis not present

## 2022-04-21 DIAGNOSIS — U071 COVID-19: Secondary | ICD-10-CM | POA: Diagnosis not present

## 2022-04-29 NOTE — Progress Notes (Deleted)
Subjective:  Chief complaint: followp for HIV disease    Patient ID: Laura Jimenez, female    DOB: 1951/04/01, 71 y.o.   MRN: [id number]  HPI   Weltha is a 71 year-old African-American lady diagnosed with HIV in the 1990s who has a history of multidrug-resistant HIV and intermittent adherence to medications.   While she was at [facility] she was on a regimen of twice daily boosted darunavir (prezista) with norvir along with emtricitabine and isentress BID and with VL <20 fairly consistently in 2016 period, then changed from [place] to [facility] for simplification of her regimen with reduction of pill burden and also giving her an integrase strand transfer inhibitor with higher barrier to resistance.   The patient has told us that she done developed a rash while on TIVICAY.  Certainly I cannot find documentation of her having developed a rash when she was switched to [facility] and she remained undetectable through several visits on this regimen of TIVICAY once daily Emtricitabine  and boosted darunavir ritonavir twice daily.   She is subsequently transferred [facility] and she is largely been viremic although she did get a viral load down to 653 and November 29 of 2021.   There was a very nice note written at [facility] which Dr. Gale Journey had put into the chart which summarize all of the resistance mutations over time that the patient had documented since 2007.   I put those mutations back into [employer] and the results are below.        Drug resistance interpretation: PR   HIVDB 9.0 (2019-10-17) PI Major Resistance Mutations:   M46I, I54V, I84V, L90IMP   PI Accessory Resistance Mutations : G73S Other Mutations: L10I, L63P, A71V, V77I   Protease Inhibitors atazanavir/r (ATV/r)    High-Level Resistance darunavir/r (DRV/r)     Low-Level Resistance lopinavir/r (LPV/r)        High-Level Resistance e  daily.    Drug resistance interpretation: RT HIVDB 9.0 (2019-10-17)   NRTI Resistance Mutations:   M41L, M184V, L210W, T215Y NNRTI Resistance Mutations: K103N, Y181C, G190A   Other Mutations: None     Nucleoside Reverse Transcriptase Inhibitors abacavir (ABC)           High-Level Resistance zidovudine (AZT)        High-Level Resistance emtricitabine (FTC)     High-Level Resistance lamivudine (3TC)        High-Level Resistance tenofovir (TDF)            Intermediate Resistance Non-nucleoside Reverse Transcriptase Inhibitors doravirine (DOR)         Intermediate Resistance efavirenz (EFV)           High-Level Resistance etravirine (ETR)          Intermediate Resistance nevirapine (NVP)        High-Level Resistance rilpivirine (RPV)           High-Level Resistance   Emma continues to not take the DESCOVY which she blames for some side effects that are clearly not related to this drug.  She also continues to take Prezista 600 mg with crushed 100 mg Norco tablets twice daily along with TIVICAY once daily.  She is not taking any of her blood pressure medications based on report given to Christie.  She tells me that if her blood pressure cannot be controlled by 3 medicines she will not take more and that it must be because  of the HIV medicines that she also wants to stop now.      Past Medical History:  Diagnosis Date   Anxiety    Arthritis    Cancer ([place])    CVA (cerebral infarction)    Eye swelling 07/31/2021   GERD (gastroesophageal reflux disease)    Gout    HIV infection ([place])    HTN (hypertension)    Hyperlipidemia    Morbid obesity with BMI of 40.0-44.9, adult ([place])    Postmenopausal bleeding    Stroke ([place])    no residual paralysis    Past Surgical History:  Procedure Laterality Date   COLONOSCOPY  2000   HYSTEROSCOPY WITH D & C N/A 04/19/2013   Procedure: DILATATION AND CURETTAGE /HYSTEROSCOPY;  Surgeon: Emily Filbert, MD;  Location: [facility];  Service:  Gynecology;  Laterality: N/A;   LYMPH NODE BIOPSY Right 09/17/2020   Procedure: LYMPH NODE BIOPSY; INGUINAL;  Surgeon: Virl Cagey, MD;  Location: [facility];  Service: General;  Laterality: Right;    Family History  Problem Relation Age of Onset   Hypertension Mother    Hypertension Sister    Arthritis Sister    Cancer Other    Diabetes Other       Social History   Socioeconomic History   Marital status: Widowed    Spouse name: Not on file   Number of children: Not on file   Years of education: Not on file   Highest education level: 10th grade  Occupational History   Not on file  Tobacco Use   Smoking status: Former    Packs/day: 1.00    Years: 5.00    Total pack years: 5.00    Types: Cigarettes   Smokeless tobacco: Never  Vaping Use   Vaping Use: Never used  Substance and Sexual Activity   Alcohol use: No   Drug use: No   Sexual activity: Not Currently    Birth control/protection: None, Post-menopausal  Other Topics Concern   Not on file  Social History Narrative   Grew up in [place], [place].   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   2 children, boy and girl. Son is 71s, and special needs.   Daughter in her 19s, is a Marine scientist.   Eats all food groups.   Wears seatbelt.   Attends church.   Enjoys watching tv, listening to [employer], and spending time with friends.   Enjoys going out to eat.    Social Determinants of Health   Financial Resource Strain: Low Risk  (06/27/2020)   Overall Financial Resource Strain (CARDIA)    Difficulty of Paying Living Expenses: Not hard at all  Food Insecurity: No Food Insecurity (06/27/2020)   Hunger Vital Sign    Worried About Running Out of Food in the Last Year: Never true    Ran Out of Food in the Last Year: Never true  Transportation Needs: No Transportation Needs (06/27/2020)   PRAPARE - Hydrologist (Medical): No    Lack of Transportation (Non-Medical): No  Physical Activity:  Sufficiently Active (06/27/2020)   Exercise Vital Sign    Days of Exercise per Week: 7 days    Minutes of Exercise per Session: 60 min  Stress: Stress Concern Present (07/03/2020)   [place]    Feeling of Stress : To some extent  Social Connections: Moderately Isolated (06/27/2020)   Social Connection and  Isolation Panel [NHANES]    Frequency of Communication with Friends and Family: More than three times a week    Frequency of Social Gatherings with Friends and Family: Once a week    Attends Religious Services: More than 4 times per year    Active Member of [employer] or Organizations: No    Attends Archivist Meetings: Never    Marital Status: Widowed    Allergies  Allergen Reactions   Gadolinium Derivatives Nausea And Vomiting    Vomited with multihance 12/05/19 Vomited with multihance 12/05/19   Amlodipine Other (See Comments)   Ivp Dye [Iodinated Contrast Media] Other (See Comments)    Caused kidney damage   Penicillins Swelling   Tape Dermatitis   Cephalexin Rash   Latex Rash     Current Outpatient Medications:    acetaminophen (TYLENOL) 325 MG tablet, , Disp: , Rfl:    allopurinol (ZYLOPRIM) 100 MG tablet, 1 tablet, Disp: , Rfl:    ascorbic acid (VITAMIN C) 500 MG tablet, Take 1 tablet by mouth daily., Disp: , Rfl:    aspirin (PX ENTERIC ASPIRIN) 81 MG EC tablet, Take 1 tablet (81 mg total) by mouth daily. Swallow whole., Disp: 30 tablet, Rfl: 12   Aspirin-Salicylamide-Caffeine (BC HEADACHE POWDER PO), Take by mouth., Disp: , Rfl:    atorvastatin (LIPITOR) 10 MG tablet, Take 1 tablet by mouth daily., Disp: , Rfl:    dolutegravir (TIVICAY) 50 MG tablet, TAKE 1 TABLET (50 MG TOTAL) BY MOUTH DAILY., Disp: 30 tablet, Rfl: 11   ferrous sulfate 325 (65 FE) MG tablet, Take 325 mg by mouth daily with breakfast., Disp: , Rfl:    ibuprofen (ADVIL) 200 MG tablet, Take 200 mg by mouth every 8 (eight) hours as  needed for moderate pain. (Patient not taking: Reported on 12/18/2021), Disp: , Rfl:    levothyroxine (SYNTHROID) 25 MCG tablet, Take 25 mcg by mouth every morning., Disp: , Rfl:    metoprolol succinate (TOPROL-XL) 25 MG 24 hr tablet, Take 25 mg by mouth at bedtime. (Patient not taking: Reported on 12/18/2021), Disp: , Rfl:    olmesartan (BENICAR) 20 MG tablet, Take 40 mg by mouth daily., Disp: , Rfl:    pantoprazole (PROTONIX) 40 MG tablet, Take 40 mg by mouth daily., Disp: , Rfl:    predniSONE (DELTASONE) 20 MG tablet, Take 2 tablets (40 mg total) by mouth daily with breakfast., Disp: 10 tablet, Rfl: 0   PREZISTA 600 MG tablet, Take 1 tablet (600 mg total) by mouth 2 (two) times daily with a meal., Disp: 60 tablet, Rfl: 11   ritonavir (NORVIR) 100 MG TABS tablet, Take 1 tablet (100 mg total) by mouth 2 (two) times daily with a meal., Disp: 60 tablet, Rfl: 11   tetrahydrozoline 0.05 % ophthalmic solution, Place 1 drop into both eyes daily as needed (red eyes)., Disp: , Rfl:    triamterene-hydrochlorothiazide (MAXZIDE-25) 37.5-25 MG tablet, Take 1 tablet by mouth daily., Disp: , Rfl:    Vitamin D, Ergocalciferol, (DRISDOL) 1.25 MG (50000 UNIT) CAPS capsule, Take 50,000 Units by mouth every 7 (seven) days., Disp: , Rfl:     Review of Systems  Constitutional:  Negative for activity change, appetite change, chills, diaphoresis, fatigue, fever and unexpected weight change.  HENT:  Negative for congestion, rhinorrhea, sinus pressure, sneezing, sore throat and trouble swallowing.   Eyes:  Negative for photophobia and visual disturbance.  Respiratory:  Negative for cough, chest tightness, shortness of breath, wheezing and stridor.  Cardiovascular:  Negative for chest pain, palpitations and leg swelling.  Gastrointestinal:  Negative for abdominal distention, abdominal pain, anal bleeding, blood in stool, constipation, diarrhea, nausea and vomiting.  Genitourinary:  Negative for difficulty urinating,  dysuria, flank pain and hematuria.  Musculoskeletal:  Negative for arthralgias, back pain, gait problem, joint swelling and myalgias.  Skin:  Negative for color change, pallor, rash and wound.  Neurological:  Negative for dizziness, tremors, weakness and light-headedness.  Hematological:  Negative for adenopathy. Does not bruise/bleed easily.  Psychiatric/Behavioral:  Negative for agitation, behavioral problems, confusion, decreased concentration, dysphoric mood and sleep disturbance.        Objective:   Physical Exam Constitutional:      General: She is not in acute distress.    Appearance: Normal appearance. She is well-developed. She is not ill-appearing or diaphoretic.  HENT:     Head: Normocephalic and atraumatic.     Right Ear: Hearing and external ear normal.     Left Ear: Hearing and external ear normal.     Nose: No nasal deformity or rhinorrhea.  Eyes:     General: No scleral icterus.    Conjunctiva/sclera: Conjunctivae normal.     Right eye: Right conjunctiva is not injected.     Left eye: Left conjunctiva is not injected.     Pupils: Pupils are equal, round, and reactive to light.  Neck:     Vascular: No JVD.  Cardiovascular:     Rate and Rhythm: Normal rate and regular rhythm.     Heart sounds: Normal heart sounds, S1 normal and S2 normal. No murmur heard.    No friction rub.  Abdominal:     General: Bowel sounds are normal. There is no distension.     Palpations: Abdomen is soft.     Tenderness: There is no abdominal tenderness.  Musculoskeletal:        General: Normal range of motion.     Right shoulder: Normal.     Left shoulder: Normal.     Cervical back: Normal range of motion and neck supple.     Right hip: Normal.     Left hip: Normal.     Right knee: Normal.     Left knee: Normal.  Lymphadenopathy:     Head:     Right side of head: No submandibular, preauricular or posterior auricular adenopathy.     Left side of head: No submandibular, preauricular  or posterior auricular adenopathy.     Cervical: No cervical adenopathy.     Right cervical: No superficial or deep cervical adenopathy.    Left cervical: No superficial or deep cervical adenopathy.  Skin:    General: Skin is warm and dry.     Coloration: Skin is not pale.     Findings: No abrasion, bruising, ecchymosis, erythema, lesion or rash.     Nails: There is no clubbing.  Neurological:     Mental Status: She is alert and oriented to person, place, and time.     Sensory: No sensory deficit.     Coordination: Coordination normal.     Gait: Gait normal.  Psychiatric:        Attention and Perception: She is attentive.        Speech: Speech normal.        Behavior: Behavior normal. Behavior is cooperative.        Thought Content: Thought content normal.        Judgment: Judgment normal.  A/P  Multidrug resistant HIV:  She continues to tinker with her meds not taking the DESCOVY crushing the NORVIR and on a suboptimal regimen of Prezista Norvir twice daily and TIVICAY once daily with undoubtedly poor adherence even to this.  I will check viral load w reflex to genotype, CD4, CBC and CMP  I will continue her   Hypertension:   Hypothyroidism:

## 2022-04-30 ENCOUNTER — Ambulatory Visit: Payer: Medicare Other | Admitting: Infectious Disease

## 2022-04-30 DIAGNOSIS — E039 Hypothyroidism, unspecified: Secondary | ICD-10-CM

## 2022-04-30 DIAGNOSIS — B20 Human immunodeficiency virus [HIV] disease: Secondary | ICD-10-CM

## 2022-04-30 DIAGNOSIS — I10 Essential (primary) hypertension: Secondary | ICD-10-CM

## 2022-04-30 DIAGNOSIS — E782 Mixed hyperlipidemia: Secondary | ICD-10-CM

## 2022-05-01 DIAGNOSIS — E782 Mixed hyperlipidemia: Secondary | ICD-10-CM | POA: Diagnosis not present

## 2022-05-01 DIAGNOSIS — M1A9XX0 Chronic gout, unspecified, without tophus (tophi): Secondary | ICD-10-CM | POA: Diagnosis not present

## 2022-05-01 DIAGNOSIS — I10 Essential (primary) hypertension: Secondary | ICD-10-CM | POA: Diagnosis not present

## 2022-05-01 LAB — BASIC METABOLIC PANEL: EGFR: 73

## 2022-05-18 ENCOUNTER — Ambulatory Visit (HOSPITAL_COMMUNITY)
Admission: EM | Admit: 2022-05-18 | Discharge: 2022-05-18 | Disposition: A | Discharge status: Home / Self Care | Payer: Medicare Other | Attending: Physician Assistant | Admitting: Physician Assistant

## 2022-05-18 ENCOUNTER — Ambulatory Visit (INDEPENDENT_AMBULATORY_CARE_PROVIDER_SITE_OTHER): Payer: Medicare Other

## 2022-05-18 ENCOUNTER — Encounter (HOSPITAL_COMMUNITY): Payer: Self-pay | Admitting: Physician Assistant

## 2022-05-18 DIAGNOSIS — R062 Wheezing: Secondary | ICD-10-CM

## 2022-05-18 DIAGNOSIS — R059 Cough, unspecified: Secondary | ICD-10-CM

## 2022-05-18 DIAGNOSIS — J40 Bronchitis, not specified as acute or chronic: Secondary | ICD-10-CM

## 2022-05-18 DIAGNOSIS — R0602 Shortness of breath: Secondary | ICD-10-CM | POA: Diagnosis not present

## 2022-05-18 DIAGNOSIS — J329 Chronic sinusitis, unspecified: Secondary | ICD-10-CM

## 2022-05-18 DIAGNOSIS — R051 Acute cough: Secondary | ICD-10-CM

## 2022-05-18 MED ORDER — DOXYCYCLINE HYCLATE 100 MG PO CAPS: 100.0000 mg | ORAL_CAPSULE | Freq: Two times a day (BID) | ORAL | 0 refills | Status: DC

## 2022-05-18 MED ORDER — ALBUTEROL SULFATE HFA 108 (90 BASE) MCG/ACT IN AERS
2.0000 | INHALATION_SPRAY | Freq: Once | RESPIRATORY_TRACT | Status: AC
  Administered 2022-05-18: 2 via RESPIRATORY_TRACT

## 2022-05-18 MED ORDER — ALBUTEROL SULFATE HFA 108 (90 BASE) MCG/ACT IN AERS
INHALATION_SPRAY | RESPIRATORY_TRACT | Status: AC
  Filled 2022-05-18: qty 6.7

## 2022-05-18 NOTE — Discharge Instructions (Addendum)
Your chest x-ray showed no evidence of pneumonia.  Please start doxycycline 100 mg to cover for any kind of bacterial infection.  Continue using albuterol every 4-6 hours as needed for shortness of breath.  Continue over-the-counter medication for symptom management.  Follow-up with your primary care next week.  If you have any worsening symptoms you need to be seen immediately including high fever, weakness, shortness of breath, chest pain, worsening cough.

## 2022-05-18 NOTE — ED Triage Notes (Signed)
Pt reports a cough ,SHOB and wheezing. Pt was positive for COVID and has had cough ,SHOB and wheezing. Pt ha sseen PCP and was instructed to take OTC.

## 2022-05-18 NOTE — ED Provider Notes (Signed)
[place]    CSN: [id number] Arrival date & time: 05/18/22  1409      History   Chief Complaint Chief Complaint  Patient presents with   Cough   Shortness of Breath   Wheezing    HPI Laura Jimenez is a 71 y.o. female.   Patient presents today accompanied by her daughter who provided the majority of history.  Reports a month-long history of cough, wheezing, shortness of breath.  She did have COVID approximately a month ago and was prescribed Paxlovid by her primary care.  She took this medication but continues to have ongoing cough and wheezing.  She denies history of asthma or COPD.  She does not smoke.  She was seen by her primary care approximately 2 weeks ago and they recommended she continue over-the-counter medications for symptom management.  She has been doing this taking Mucinex, DayQuil high blood pressure, Coricidin HBP without improvement of symptoms.  Denies any recent antibiotic or steroid.  She is having difficulty with the activities as result of symptoms.    Past Medical History:  Diagnosis Date   Anxiety    Arthritis    Cancer ([place])    CVA (cerebral infarction)    Eye swelling 07/31/2021   GERD (gastroesophageal reflux disease)    Gout    HIV infection ([place])    HTN (hypertension)    Hyperlipidemia    Morbid obesity with BMI of 40.0-44.9, adult ([place])    Postmenopausal bleeding    Stroke [facility])    no residual paralysis    Patient Active Problem List   Diagnosis Date Noted   Arthritis of left shoulder region 02/26/2022   Eye swelling 07/31/2021   HIV disease ([place]) 11/21/2020   Postoperative seroma of skin after non-dermatologic procedure 10/09/2020   Right leg swelling 10/09/2020   Acute pain of left shoulder 10/06/2018   Hypothyroidism 10/23/2017   Hyperlipidemia, mixed 08/11/2017   CVA (cerebral vascular accident) ([place]) 08/11/2017   Morbid obesity ([place]) 08/11/2017   Vulvar cancer ([place]) 11/13/2011   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   History of CVA (cerebrovascular accident) 11/13/2011   Post-menopausal bleeding 11/13/2011   HIV INFECTION 09/08/2006   CANDIDIASIS, VAGINAL 09/08/2006   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   PERIPHERAL NEUROPATHY 09/08/2006   Essential hypertension 09/08/2006   REFLUX ESOPHAGITIS 09/08/2006   OVARIAN CYST 09/08/2006   DYSPLASIA, CERVIX NOS 86/76/7209   FOLLICULITIS 47/04/6282   Enlarged lymph nodes 09/08/2006   PAP SMEAR, ABNORMAL 10/09/2000    Past Surgical History:  Procedure Laterality Date   COLONOSCOPY  2000   HYSTEROSCOPY WITH D & C N/A 04/19/2013   Procedure: DILATATION AND CURETTAGE /HYSTEROSCOPY;  Surgeon: Emily Filbert, MD;  Location: [facility];  Service: Gynecology;  Laterality: N/A;   LYMPH NODE BIOPSY Right 09/17/2020   Procedure: LYMPH NODE BIOPSY; INGUINAL;  Surgeon: Virl Cagey, MD;  Location: [facility];  Service: General;  Laterality: Right;    OB History     Gravida  3   Para  2   Term  2   Preterm      AB  1   Living         SAB  1   IAB      Ectopic      Multiple      Live Births               Home Medications    Prior to Admission medications   Medication Sig  Start Date End Date Taking? Authorizing Provider  doxycycline (VIBRAMYCIN) 100 MG capsule Take 1 capsule (100 mg total) by mouth 2 (two) times daily. 05/18/22  Yes Kameron Glazebrook, Derry Skill, PA-C  acetaminophen (TYLENOL) 325 MG tablet     [provider]  allopurinol (ZYLOPRIM) 100 MG tablet 1 tablet 05/22/21   [provider]  ascorbic acid (VITAMIN C) 500 MG tablet Take 1 tablet by mouth daily.    [provider]  aspirin (PX ENTERIC ASPIRIN) 81 MG EC tablet Take 1 tablet (81 mg total) by mouth daily. Swallow whole. 09/25/17   Caren Macadam, MD  Aspirin-Salicylamide-Caffeine (BC HEADACHE POWDER PO) Take by mouth.    [provider]  atorvastatin (LIPITOR) 10 MG tablet Take 1 tablet by mouth daily. 10/09/20   [provider]  dolutegravir (TIVICAY)  50 MG tablet TAKE 1 TABLET (50 MG TOTAL) BY MOUTH DAILY. 10/07/21 10/07/22  Truman Hayward, MD  ferrous sulfate 325 (65 FE) MG tablet Take 325 mg by mouth daily with breakfast.    [provider]  ibuprofen (ADVIL) 200 MG tablet Take 200 mg by mouth every 8 (eight) hours as needed for moderate pain. Patient not taking: Reported on 12/18/2021    [provider]  levothyroxine (SYNTHROID) 25 MCG tablet Take 25 mcg by mouth every morning. 02/12/21   [provider]  metoprolol succinate (TOPROL-XL) 25 MG 24 hr tablet Take 25 mg by mouth at bedtime. Patient not taking: Reported on 12/18/2021 10/24/20   [provider]  olmesartan (BENICAR) 20 MG tablet Take 40 mg by mouth daily.    [provider]  pantoprazole (PROTONIX) 40 MG tablet Take 40 mg by mouth daily. 01/19/20   [provider]  predniSONE (DELTASONE) 20 MG tablet Take 2 tablets (40 mg total) by mouth daily with breakfast. 04/03/22   Jaynee Eagles, PA-C  PREZISTA 600 MG tablet Take 1 tablet (600 mg total) by mouth 2 (two) times daily with a meal. 10/07/21   [name], Lavell Islam, MD  ritonavir (NORVIR) 100 MG TABS tablet Take 1 tablet (100 mg total) by mouth 2 (two) times daily with a meal. 10/07/21   [name], Lavell Islam, MD  tetrahydrozoline 0.05 % ophthalmic solution Place 1 drop into both eyes daily as needed (red eyes).    [provider]  triamterene-hydrochlorothiazide (MAXZIDE-25) 37.5-25 MG tablet Take 1 tablet by mouth daily.    [provider]  Vitamin D, Ergocalciferol, (DRISDOL) 1.25 MG (50000 UNIT) CAPS capsule Take 50,000 Units by mouth every 7 (seven) days.    [provider]    Family History Family History  Problem Relation Age of Onset   Hypertension Mother    Hypertension Sister    Arthritis Sister    Cancer Other    Diabetes Other     Social History Social History   Tobacco Use   Smoking status: Former    Packs/day: 1.00    Years: 5.00     Total pack years: 5.00    Types: Cigarettes   Smokeless tobacco: Never  Vaping Use   Vaping Use: Never used  Substance Use Topics   Alcohol use: No   Drug use: No     Allergies   Gadolinium derivatives, Amlodipine, Ivp dye [iodinated contrast media], Penicillins, Tape, Cephalexin, and Latex   Review of Systems Review of Systems  Constitutional:  Positive for activity change. Negative for appetite change, fatigue and fever.  HENT:  Positive for congestion  and sinus pressure. Negative for sneezing and sore throat.   Respiratory:  Positive for cough, chest tightness and shortness of breath. Negative for wheezing.   Cardiovascular:  Negative for chest pain.  Gastrointestinal:  Negative for abdominal pain, diarrhea, nausea and vomiting.  Neurological:  Negative for dizziness, light-headedness and headaches.     Physical Exam Triage Vital Signs ED Triage Vitals  Enc Vitals Group     BP 05/18/22 1547 (!) 192/92     Pulse Rate 05/18/22 1547 69     Resp 05/18/22 1547 (!) 22     Temp --      Temp src --      SpO2 05/18/22 1545 98 %     Weight --      Height --      Head Circumference --      Peak Flow --      Pain Score 05/18/22 1544 0     Pain Loc --      Pain Edu? --      Excl. in [place]? --    No data found.  Updated Vital Signs BP (!) 164/83   Pulse 64   Temp 98 F (36.7 C)   Resp 20   SpO2 97%   Visual Acuity Right Eye Distance:   Left Eye Distance:   Bilateral Distance:    Right Eye Near:   Left Eye Near:    Bilateral Near:     Physical Exam Vitals reviewed.  Constitutional:      General: She is awake. She is not in acute distress.    Appearance: Normal appearance. She is well-developed. She is not ill-appearing.     Comments: Very pleasant female appears stated age in no acute distress sitting comfortably in exam room  HENT:     Head: Normocephalic and atraumatic.     Right Ear: Tympanic membrane, ear canal and external ear normal. Tympanic  membrane is not erythematous or bulging.     Left Ear: Tympanic membrane, ear canal and external ear normal. Tympanic membrane is not erythematous or bulging.     Mouth/Throat:     Pharynx: Uvula midline. Posterior oropharyngeal erythema present. No oropharyngeal exudate.     Comments: Erythema and drainage in posterior oropharynx Cardiovascular:     Rate and Rhythm: Normal rate and regular rhythm.     Heart sounds: Normal heart sounds, S1 normal and S2 normal. No murmur heard. Pulmonary:     Effort: Pulmonary effort is normal.     Breath sounds: Wheezing present. No rhonchi or rales.     Comments: Scattered wheezing; decreased aeration bilateral bases.  Significant improvement with albuterol in clinic Psychiatric:        Behavior: Behavior is cooperative.      UC Treatments / Results  Labs (all labs ordered are listed, but only abnormal results are displayed) Labs Reviewed - No data to display  EKG   Radiology DG Chest 2 View  Result Date: 05/18/2022 CLINICAL DATA:  Cough, shortness of breath, wheezing EXAM: CHEST - 2 VIEW COMPARISON:  08/06/2007 FINDINGS: The heart size and mediastinal contours are within normal limits. Both lungs are clear. The visualized skeletal structures are unremarkable. IMPRESSION: No active cardiopulmonary disease. Electronically Signed   By: Elmer Picker M.D.   On: 05/18/2022 16:13    Procedures Procedures (including critical care time)  Medications Ordered in UC Medications  albuterol (VENTOLIN HFA) 108 (90 Base) MCG/ACT inhaler 2 puff (2 puffs Inhalation Given 05/18/22 1607)  Initial Impression / Assessment and Plan / UC Course  I have reviewed the triage vital signs and the nursing notes.  Pertinent labs & imaging results that were available during my care of the patient were reviewed by me and considered in my medical decision making (see chart for details).     Patient is well-appearing, afebrile, nontoxic, nontachycardic.  She  was hypertensive on intake but this improved after she was sitting quietly for several hours.  Chest x-ray was obtained given prolonged history of symptoms which showed no acute cardiopulmonary disease.  We will start doxycycline 100 mg twice daily to cover for sinobronchitis.  She was instructed not to stay out of the sun while on this medication due to associated photosensitivity.  She is to rest and drink plenty of fluid.  Recommended follow-up with her primary care next week.  She was given albuterol inhaler in clinic with improvement of wheezing/cough symptoms.  This was sent home with her with instruction to use this every 4-6 hours as needed.  Recommended she use over-the-counter medications including Mucinex, Flonase, Tylenol.  She is to rest and drink plenty of fluid.  Discussed that if anything worsens and she develops worsening cough, shortness of breath, fever, chest pain, nausea, vomiting she needs to be seen immediately.  Strict return precautions given.  Final Clinical Impressions(s) / UC Diagnoses   Final diagnoses:  Sinobronchitis  Acute cough  Wheezing     Discharge Instructions      Your chest x-ray showed no evidence of pneumonia.  Please start doxycycline 100 mg to cover for any kind of bacterial infection.  Continue using albuterol every 4-6 hours as needed for shortness of breath.  Continue over-the-counter medication for symptom management.  Follow-up with your primary care next week.  If you have any worsening symptoms you need to be seen immediately including high fever, weakness, shortness of breath, chest pain, worsening cough.     ED Prescriptions     Medication Sig Dispense Auth. Provider   doxycycline (VIBRAMYCIN) 100 MG capsule Take 1 capsule (100 mg total) by mouth 2 (two) times daily. 20 capsule Amnah Breuer, Derry Skill, PA-C      PDMP not reviewed this encounter.   Terrilee Croak, PA-C 05/18/22 1707

## 2022-06-24 ENCOUNTER — Other Ambulatory Visit: Payer: Self-pay

## 2022-06-24 ENCOUNTER — Ambulatory Visit
Admission: EM | Admit: 2022-06-24 | Discharge: 2022-06-24 | Disposition: A | Discharge status: Home / Self Care | Payer: Medicare Other | Attending: Nurse Practitioner | Admitting: Nurse Practitioner

## 2022-06-24 ENCOUNTER — Encounter: Payer: Self-pay | Admitting: Emergency Medicine

## 2022-06-24 DIAGNOSIS — M25476 Effusion, unspecified foot: Secondary | ICD-10-CM | POA: Diagnosis not present

## 2022-06-24 DIAGNOSIS — M79671 Pain in right foot: Secondary | ICD-10-CM | POA: Diagnosis not present

## 2022-06-24 DIAGNOSIS — M79674 Pain in right toe(s): Secondary | ICD-10-CM

## 2022-06-24 MED ORDER — PREDNISONE 20 MG PO TABS: 40.0000 mg | ORAL_TABLET | Freq: Every day | ORAL | 0 refills | Status: AC

## 2022-06-24 NOTE — ED Triage Notes (Signed)
Pt reports right foot pain since this am. Pt reports history of similar and reports feels like "gout" flare-up.   Impaired gait and increased pain with ambulation.denies any known injury. Moderate swelling noted to distal join of right great toe and top of right foot.

## 2022-06-24 NOTE — Discharge Instructions (Addendum)
Take medication as prescribed. Begin to monitor your dietary intake to cut back on foods that may be causing a gout flare. May take over-the-counter Tylenol as needed for pain, fever, or general discomfort. Plan to follow-up with your primary care physician as scheduled for evaluation for gout. If your symptoms worsen or do not improve after this medication, please follow-up with your primary care physician. Follow-up as needed.

## 2022-06-24 NOTE — ED Provider Notes (Signed)
[facility]    CSN: [id number] Arrival date & time: 06/24/22  1549      History   Chief Complaint Chief Complaint  Patient presents with   Foot Pain    HPI Laura Jimenez is a 71 y.o. female.   The history is provided by the patient.   Patient presents with a 1 day history of acute right foot pain and swelling.  Patient reports previous history of gout.  She states that she was last seen for the same or similar symptoms approximately 2 months ago.  She states that she is scheduled to see her PCP next month for lab work for gout.  She states that the last time she came, she did get a steroid which helped her symptoms.  She denies eating a diet to prevent her gout.  She states that the last time she came, her blood pressure was too high to get a steroid shot.  Patient reports history of CVA, hypertension, denies history of diabetes. Past Medical History:  Diagnosis Date   Anxiety    Arthritis    Cancer ([place])    CVA (cerebral infarction)    Eye swelling 07/31/2021   GERD (gastroesophageal reflux disease)    Gout    HIV infection ([place])    HTN (hypertension)    Hyperlipidemia    Morbid obesity with BMI of 40.0-44.9, adult ([place])    Postmenopausal bleeding    Stroke [facility])    no residual paralysis    Patient Active Problem List   Diagnosis Date Noted   Arthritis of left shoulder region 02/26/2022   Eye swelling 07/31/2021   HIV disease ([place]) 11/21/2020   Postoperative seroma of skin after non-dermatologic procedure 10/09/2020   Right leg swelling 10/09/2020   Acute pain of left shoulder 10/06/2018   Hypothyroidism 10/23/2017   Hyperlipidemia, mixed 08/11/2017   CVA (cerebral vascular accident) ([place]) 08/11/2017   Morbid obesity ([place]) 08/11/2017   Vulvar cancer ([place]) 11/13/2011   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   History of CVA (cerebrovascular accident) 11/13/2011   Post-menopausal bleeding 11/13/2011   HIV INFECTION 09/08/2006   CANDIDIASIS,  VAGINAL 09/08/2006   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   PERIPHERAL NEUROPATHY 09/08/2006   Essential hypertension 09/08/2006   REFLUX ESOPHAGITIS 09/08/2006   OVARIAN CYST 09/08/2006   DYSPLASIA, CERVIX NOS 00/34/9179   FOLLICULITIS 15/12/6977   Enlarged lymph nodes 09/08/2006   PAP SMEAR, ABNORMAL 10/09/2000    Past Surgical History:  Procedure Laterality Date   COLONOSCOPY  2000   HYSTEROSCOPY WITH D & C N/A 04/19/2013   Procedure: DILATATION AND CURETTAGE /HYSTEROSCOPY;  Surgeon: Emily Filbert, MD;  Location: [facility];  Service: Gynecology;  Laterality: N/A;   LYMPH NODE BIOPSY Right 09/17/2020   Procedure: LYMPH NODE BIOPSY; INGUINAL;  Surgeon: Virl Cagey, MD;  Location: [facility];  Service: General;  Laterality: Right;    OB History     Gravida  3   Para  2   Term  2   Preterm      AB  1   Living         SAB  1   IAB      Ectopic      Multiple      Live Births               Home Medications    Prior to Admission medications   Medication Sig Start Date End Date Taking? Authorizing Provider  predniSONE (DELTASONE) 20  MG tablet Take 2 tablets (40 mg total) by mouth daily with breakfast for 5 days. 06/24/22 06/29/22 Yes Diella Gillingham-Warren, Alda Lea, NP  acetaminophen (TYLENOL) 325 MG tablet     [provider]  allopurinol (ZYLOPRIM) 100 MG tablet Prescription ran out 05/22/21   [provider]  ascorbic acid (VITAMIN C) 500 MG tablet Take 1 tablet by mouth daily.    [provider]  aspirin (PX ENTERIC ASPIRIN) 81 MG EC tablet Take 1 tablet (81 mg total) by mouth daily. Swallow whole. 09/25/17   Caren Macadam, MD  Aspirin-Salicylamide-Caffeine (BC HEADACHE POWDER PO) Take by mouth.    [provider]  atorvastatin (LIPITOR) 10 MG tablet Take 1 tablet by mouth daily. 10/09/20   [provider]  dolutegravir (TIVICAY) 50 MG tablet TAKE 1 TABLET (50 MG TOTAL) BY MOUTH DAILY. 10/07/21 10/07/22  Truman Hayward, MD  doxycycline  (VIBRAMYCIN) 100 MG capsule Take 1 capsule (100 mg total) by mouth 2 (two) times daily. 05/18/22   Raspet, Derry Skill, PA-C  ferrous sulfate 325 (65 FE) MG tablet Take 325 mg by mouth daily with breakfast.    [provider]  ibuprofen (ADVIL) 200 MG tablet Take 200 mg by mouth every 8 (eight) hours as needed for moderate pain. Patient not taking: Reported on 06/24/2022    [provider]  levothyroxine (SYNTHROID) 25 MCG tablet Take 25 mcg by mouth every morning. 02/12/21   [provider]  metoprolol succinate (TOPROL-XL) 25 MG 24 hr tablet Take 25 mg by mouth at bedtime. 10/24/20   [provider]  olmesartan (BENICAR) 20 MG tablet Take 40 mg by mouth daily.    [provider]  pantoprazole (PROTONIX) 40 MG tablet Take 40 mg by mouth daily. 01/19/20   [provider]  PREZISTA 600 MG tablet Take 1 tablet (600 mg total) by mouth 2 (two) times daily with a meal. 10/07/21   [name], Lavell Islam, MD  ritonavir (NORVIR) 100 MG TABS tablet Take 1 tablet (100 mg total) by mouth 2 (two) times daily with a meal. 10/07/21   [name], Lavell Islam, MD  tetrahydrozoline 0.05 % ophthalmic solution Place 1 drop into both eyes daily as needed (red eyes). Patient not taking: Reported on 06/24/2022    [provider]  triamterene-hydrochlorothiazide (MAXZIDE-25) 37.5-25 MG tablet Take 1 tablet by mouth daily.    [provider]  Vitamin D, Ergocalciferol, (DRISDOL) 1.25 MG (50000 UNIT) CAPS capsule Take 50,000 Units by mouth every 7 (seven) days.    [provider]    Family History Family History  Problem Relation Age of Onset   Hypertension Mother    Hypertension Sister    Arthritis Sister    Cancer Other    Diabetes Other     Social History Social History   Tobacco Use   Smoking status: Former    Packs/day: 1.00    Years: 5.00    Total pack years: 5.00    Types: Cigarettes   Smokeless tobacco: Never  Vaping Use   Vaping  Use: Never used  Substance Use Topics   Alcohol use: No   Drug use: No     Allergies   Gadolinium derivatives, Amlodipine, Ivp dye [iodinated contrast media], Penicillins, Tape, Cephalexin, and Latex   Review of Systems Review of Systems Per HPI  Physical Exam Triage Vital Signs ED Triage Vitals  Enc Vitals Group     BP 06/24/22 1650 (!) 175/99  Pulse Rate 06/24/22 1650 70     Resp 06/24/22 1650 20     Temp 06/24/22 1650 99.3 F (37.4 C)     Temp Source 06/24/22 1650 Oral     SpO2 06/24/22 1650 97 %     Weight --      Height --      Head Circumference --      Peak Flow --      Pain Score 06/24/22 1642 6     Pain Loc --      Pain Edu? --      Excl. in [place]? --    No data found.  Updated Vital Signs BP (!) 175/99 (BP Location: Right Arm)   Pulse 70   Temp 99.3 F (37.4 C) (Oral)   Resp 20   SpO2 97%   Visual Acuity Right Eye Distance:   Left Eye Distance:   Bilateral Distance:    Right Eye Near:   Left Eye Near:    Bilateral Near:     Physical Exam Vitals and nursing note reviewed.  Constitutional:      General: She is not in acute distress.    Appearance: Normal appearance.  HENT:     Head: Normocephalic.  Eyes:     Extraocular Movements: Extraocular movements intact.     Pupils: Pupils are equal, round, and reactive to light.  Pulmonary:     Effort: Pulmonary effort is normal.  Abdominal:     General: Bowel sounds are normal.     Palpations: Abdomen is soft.     Tenderness: There is no abdominal tenderness.  Musculoskeletal:     Right foot: Decreased range of motion. Normal capillary refill. Swelling and tenderness present. Normal pulse.       Legs:     Comments: Decreased range of motion to the right foot/great toe. Normal capillary refill. Swelling, tenderness and bony tenderness present to the first metatarsal.  Area is warm to palpation and erythematous.  No deformity, bunion, laceration or crepitus.   Skin:    General: Skin is warm and  dry.  Neurological:     General: No focal deficit present.     Mental Status: She is alert and oriented to person, place, and time.  Psychiatric:        Mood and Affect: Mood normal.        Behavior: Behavior normal.      UC Treatments / Results  Labs (all labs ordered are listed, but only abnormal results are displayed) Labs Reviewed - No data to display  EKG   Radiology No results found.  Procedures Procedures (including critical care time)  Medications Ordered in UC Medications - No data to display  Initial Impression / Assessment and Plan / UC Course  I have reviewed the triage vital signs and the nursing notes.  Pertinent labs & imaging results that were available during my care of the patient were reviewed by me and considered in my medical decision making (see chart for details).  Patient is well-appearing, she is in no acute distress.  Blood pressure remains elevated at this time, vital signs are otherwise stable.  Symptoms appear to be consistent with a gout flare.  We will start patient on prednisone 40 mg for 5 days.  Counseled patient regarding dietary changes to avoid gout flares.  Also advised patient to continue with follow-up with her primary care for evaluation of her suspected gout.  Supportive care recommendations were discussed with the patient.  Patient verbalizes understanding.  All questions were answered.  Follow-up as needed.  Patient is stable for discharge.   Final Clinical Impressions(s) / UC Diagnoses   Final diagnoses:  Great toe pain, right  Soft tissue swelling of joint of toe  Right foot pain     Discharge Instructions      Take medication as prescribed. Begin to monitor your dietary intake to cut back on foods that may be causing a gout flare. May take over-the-counter Tylenol as needed for pain, fever, or general discomfort. Plan to follow-up with your primary care physician as scheduled for evaluation for gout. If your symptoms  worsen or do not improve after this medication, please follow-up with your primary care physician. Follow-up as needed.      ED Prescriptions     Medication Sig Dispense Auth. Provider   predniSONE (DELTASONE) 20 MG tablet Take 2 tablets (40 mg total) by mouth daily with breakfast for 5 days. 10 tablet Amisadai Woodford-Warren, Alda Lea, NP      PDMP not reviewed this encounter.   Tish Men, NP 06/24/22 1723

## 2022-08-04 DIAGNOSIS — H25813 Combined forms of age-related cataract, bilateral: Secondary | ICD-10-CM | POA: Diagnosis not present

## 2022-08-04 DIAGNOSIS — H5203 Hypermetropia, bilateral: Secondary | ICD-10-CM | POA: Diagnosis not present

## 2022-08-04 DIAGNOSIS — H524 Presbyopia: Secondary | ICD-10-CM | POA: Diagnosis not present

## 2022-08-04 DIAGNOSIS — H16223 Keratoconjunctivitis sicca, not specified as Sjogren's, bilateral: Secondary | ICD-10-CM | POA: Diagnosis not present

## 2022-08-05 ENCOUNTER — Encounter: Payer: Self-pay | Admitting: Orthopaedic Surgery

## 2022-08-05 ENCOUNTER — Ambulatory Visit (INDEPENDENT_AMBULATORY_CARE_PROVIDER_SITE_OTHER): Payer: Medicare Other

## 2022-08-05 ENCOUNTER — Ambulatory Visit (INDEPENDENT_AMBULATORY_CARE_PROVIDER_SITE_OTHER): Payer: Medicare Other | Admitting: Orthopaedic Surgery

## 2022-08-05 DIAGNOSIS — M19012 Primary osteoarthritis, left shoulder: Secondary | ICD-10-CM

## 2022-08-05 DIAGNOSIS — M19011 Primary osteoarthritis, right shoulder: Secondary | ICD-10-CM | POA: Diagnosis not present

## 2022-08-05 DIAGNOSIS — M19019 Primary osteoarthritis, unspecified shoulder: Secondary | ICD-10-CM | POA: Insufficient documentation

## 2022-08-05 NOTE — Progress Notes (Signed)
Office Visit Note   Patient: Laura Jimenez           Date of Birth: [birth year]           MRN: [id number] Visit Date: 08/05/2022              Requested by: Garald Balding, [place],   [id number] PCP: Caren Macadam, MD   Assessment & Plan: Visit Diagnoses:  1. Arthritis of left shoulder region   2. Primary osteoarthritis, right shoulder     Plan: Laura Jimenez presents today with ongoing pain in her left shoulder.  X-rays demonstrate significant arthritic changes.  She does have limited motion especially with external rotation.  She currently has been using Goody powders but finds them difficult to find.  Long discussion was had with her regarding further treatment options.  She is not interested in surgery right now.  Also discussed a cortisone injection which she deferred.  She may benefit from a round of physical therapy and she is interested in going forward with this.  May follow-up as needed for further discussion.  Will set up therapy in [place] at [facility] where she lives.  Encouraged her to call or return should she want to consider intra-articular cortisone injection  Follow-Up Instructions: Return if symptoms worsen or fail to improve.   Orders:  Orders Placed This Encounter  Procedures   XR Shoulder Left   Ambulatory referral to Physical Therapy   No orders of the defined types were placed in this encounter.     Procedures: No procedures performed   Clinical Data: No additional findings.   Subjective: Chief Complaint  Patient presents with   Left Shoulder - Pain    HPI Laura Jimenez comes in today complaining of left shoulder pain and stiffness.  No injury.  She uses Goody powders but is finding them difficult to find.  She is wondering if there is any other options medication wise.  Has been experiencing some limitation of motion but denies any numbness or tingling. no history of injury or trauma Review of Systems  All other  systems reviewed and are negative.    Objective: Vital Signs: There were no vitals taken for this visit.  Physical Exam Constitutional:      Appearance: Normal appearance.  Pulmonary:     Effort: Pulmonary effort is normal.  Skin:    General: Skin is warm and dry.  Neurological:     Mental Status: She is alert.     Ortho Exam Examination of her left shoulder she is neurovascularly intact sensation is intact.  She has pain with forward elevation.  She has pain with resisted abduction.  She has very limited external rotation.  No neck pain.  Able to passively flex the shoulder about 110 to 20 degrees and external rotation of only about 15 degrees with when she experienced some pain along the axillary line.  No grating or crepitation.  Abduction about 80 degrees.  Large arms.  Biceps appears to be intact.  Good grip and release Specialty Comments:  No specialty comments available.  Imaging: XR Shoulder Left  Result Date: 08/05/2022 Radiographs of her left shoulder were obtained today.  She has advanced degenerative changes of the shoulder with significant osteophyte formation in the inferior humeral head with associated cysts and sclerotic changes loss of joint space.  Humeral head appears centered in the glenoid.  Appears to have normal space between the humeral head and the acromion.  Films are consistent with primary osteoarthritis of the glenohumeral joint    PMFS History: Patient Active Problem List   Diagnosis Date Noted   Primary osteoarthritis, right shoulder 08/05/2022   Arthritis of left shoulder region 02/26/2022   Eye swelling 07/31/2021   HIV disease ([employer]) 11/21/2020   Postoperative seroma of skin after non-dermatologic procedure 10/09/2020   Right leg swelling 10/09/2020   Acute pain of left shoulder 10/06/2018   Hypothyroidism 10/23/2017   Hyperlipidemia, mixed 08/11/2017   CVA (cerebral vascular accident) ([place]) 08/11/2017   Morbid obesity ([place]) 08/11/2017    Vulvar cancer ([place]) 11/13/2011   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   History of CVA (cerebrovascular accident) 11/13/2011   Post-menopausal bleeding 11/13/2011   HIV INFECTION 09/08/2006   CANDIDIASIS, VAGINAL 09/08/2006   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   PERIPHERAL NEUROPATHY 09/08/2006   Essential hypertension 09/08/2006   REFLUX ESOPHAGITIS 09/08/2006   OVARIAN CYST 09/08/2006   DYSPLASIA, CERVIX NOS 67/07/4579   FOLLICULITIS 99/83/3825   Enlarged lymph nodes 09/08/2006   PAP SMEAR, ABNORMAL 10/09/2000   Past Medical History:  Diagnosis Date   Anxiety    Arthritis    Cancer ([place])    CVA (cerebral infarction)    Eye swelling 07/31/2021   GERD (gastroesophageal reflux disease)    Gout    HIV infection ([place])    HTN (hypertension)    Hyperlipidemia    Morbid obesity with BMI of 40.0-44.9, adult ([place])    Postmenopausal bleeding    Stroke ([place])    no residual paralysis    Family History  Problem Relation Age of Onset   Hypertension Mother    Hypertension Sister    Arthritis Sister    Cancer Other    Diabetes Other     Past Surgical History:  Procedure Laterality Date   COLONOSCOPY  2000   HYSTEROSCOPY WITH D & C N/A 04/19/2013   Procedure: DILATATION AND CURETTAGE /HYSTEROSCOPY;  Surgeon: Emily Filbert, MD;  Location: [facility];  Service: Gynecology;  Laterality: N/A;   LYMPH NODE BIOPSY Right 09/17/2020   Procedure: LYMPH NODE BIOPSY; INGUINAL;  Surgeon: Virl Cagey, MD;  Location: [facility];  Service: General;  Laterality: Right;   Social History   Occupational History   Not on file  Tobacco Use   Smoking status: Former    Packs/day: 1.00    Years: 5.00    Total pack years: 5.00    Types: Cigarettes   Smokeless tobacco: Never  Vaping Use   Vaping Use: Never used  Substance and Sexual Activity   Alcohol use: No   Drug use: No   Sexual activity: Not Currently    Birth control/protection: None, Post-menopausal

## 2022-08-06 ENCOUNTER — Other Ambulatory Visit: Payer: Self-pay

## 2022-08-06 DIAGNOSIS — M19012 Primary osteoarthritis, left shoulder: Secondary | ICD-10-CM

## 2022-08-07 DIAGNOSIS — E063 Autoimmune thyroiditis: Secondary | ICD-10-CM | POA: Diagnosis not present

## 2022-08-07 DIAGNOSIS — E038 Other specified hypothyroidism: Secondary | ICD-10-CM | POA: Diagnosis not present

## 2022-08-08 DIAGNOSIS — E782 Mixed hyperlipidemia: Secondary | ICD-10-CM | POA: Diagnosis not present

## 2022-08-08 DIAGNOSIS — E039 Hypothyroidism, unspecified: Secondary | ICD-10-CM | POA: Diagnosis not present

## 2022-08-08 DIAGNOSIS — I700 Atherosclerosis of aorta: Secondary | ICD-10-CM | POA: Diagnosis not present

## 2022-08-08 DIAGNOSIS — M1A9XX0 Chronic gout, unspecified, without tophus (tophi): Secondary | ICD-10-CM | POA: Diagnosis not present

## 2022-08-08 DIAGNOSIS — I10 Essential (primary) hypertension: Secondary | ICD-10-CM | POA: Diagnosis not present

## 2022-08-08 DIAGNOSIS — Z23 Encounter for immunization: Secondary | ICD-10-CM | POA: Diagnosis not present

## 2022-08-08 DIAGNOSIS — M19011 Primary osteoarthritis, right shoulder: Secondary | ICD-10-CM | POA: Diagnosis not present

## 2022-08-08 DIAGNOSIS — E559 Vitamin D deficiency, unspecified: Secondary | ICD-10-CM | POA: Diagnosis not present

## 2022-08-08 DIAGNOSIS — D649 Anemia, unspecified: Secondary | ICD-10-CM | POA: Diagnosis not present

## 2022-08-19 ENCOUNTER — Ambulatory Visit (HOSPITAL_COMMUNITY): Payer: Medicare Other | Attending: Occupational Therapy | Admitting: Occupational Therapy

## 2022-09-04 DIAGNOSIS — M25511 Pain in right shoulder: Secondary | ICD-10-CM | POA: Diagnosis not present

## 2022-09-04 DIAGNOSIS — M25512 Pain in left shoulder: Secondary | ICD-10-CM | POA: Diagnosis not present

## 2022-09-19 DIAGNOSIS — M25512 Pain in left shoulder: Secondary | ICD-10-CM | POA: Diagnosis not present

## 2022-09-30 DIAGNOSIS — M19012 Primary osteoarthritis, left shoulder: Secondary | ICD-10-CM | POA: Diagnosis not present

## 2022-10-22 DIAGNOSIS — H25812 Combined forms of age-related cataract, left eye: Secondary | ICD-10-CM | POA: Diagnosis not present

## 2022-11-14 DIAGNOSIS — H25812 Combined forms of age-related cataract, left eye: Secondary | ICD-10-CM | POA: Diagnosis not present

## 2022-11-14 DIAGNOSIS — Z01818 Encounter for other preprocedural examination: Secondary | ICD-10-CM | POA: Diagnosis not present

## 2022-11-14 DIAGNOSIS — H25811 Combined forms of age-related cataract, right eye: Secondary | ICD-10-CM | POA: Diagnosis not present

## 2022-11-24 ENCOUNTER — Other Ambulatory Visit (HOSPITAL_COMMUNITY): Payer: Self-pay

## 2022-11-24 ENCOUNTER — Encounter: Payer: Self-pay | Admitting: Infectious Disease

## 2022-11-24 ENCOUNTER — Other Ambulatory Visit: Payer: Self-pay

## 2022-11-24 ENCOUNTER — Ambulatory Visit (INDEPENDENT_AMBULATORY_CARE_PROVIDER_SITE_OTHER): Payer: 59 | Admitting: Infectious Disease

## 2022-11-24 VITALS — BP 137/79 | HR 80 | Resp 16 | Ht 64.0 in | Wt 252.0 lb

## 2022-11-24 DIAGNOSIS — I10 Essential (primary) hypertension: Secondary | ICD-10-CM

## 2022-11-24 DIAGNOSIS — E785 Hyperlipidemia, unspecified: Secondary | ICD-10-CM

## 2022-11-24 DIAGNOSIS — B20 Human immunodeficiency virus [HIV] disease: Secondary | ICD-10-CM | POA: Diagnosis not present

## 2022-11-24 DIAGNOSIS — Z91199 Patient's noncompliance with other medical treatment and regimen due to unspecified reason: Secondary | ICD-10-CM

## 2022-11-24 MED ORDER — BIKTARVY 50-200-25 MG PO TABS
1.0000 | ORAL_TABLET | Freq: Every day | ORAL | 0 refills | Status: DC
  Filled 2022-11-24 (×3): qty 30, 30d supply, fill #0

## 2022-11-24 NOTE — Progress Notes (Signed)
Subjective:  Chief complaint: followup for HIV disease--not taking medications  Patient ID: Laura Jimenez, female    DOB: May 05, 1951, 72 y.o.   MRN: [id number]  HPI   Laura Jimenez is a 72 year-old African-American lady diagnosed with HIV in the 1990s who has a history of multidrug-resistant HIV and intermittent adherence to medications.   While she was at [facility] she was on a regimen of twice daily boosted darunavir (prezista) with norvir along with emtricitabine and isentress BID and with VL <20 fairly consistently in 2016 period, then changed from [place] to [facility] for simplification of her regimen with reduction of pill burden and also giving her an integrase strand transfer inhibitor with higher barrier to resistance.   The patient has told us that she done developed a rash while on TIVICAY.  Certainly I cannot find documentation of her having developed a rash when she was switched to [facility] and she remained undetectable through several visits on this regimen of TIVICAY once daily Emtricitabine  and boosted darunavir ritonavir twice daily.   She  subsequently [facility] and she is largely been viremic although she did get a viral load down to 653 and November 29 of 2021.   There was a very nice note written at [facility] which Dr. Gale Journey had put into the chart which summarize all of the resistance mutations over time that the patient had documented since 2007.   I put those mutations back into [employer] and the results are below.        Drug resistance interpretation: PR   HIVDB 9.0 (2019-10-17) PI Major Resistance Mutations:   M46I, I54V, I84V, L90IMP   PI Accessory Resistance Mutations : G73S Other Mutations: L10I, L63P, A71V, V77I   Protease Inhibitors atazanavir/r (ATV/r)    High-Level Resistance darunavir/r (DRV/r)     Low-Level Resistance lopinavir/r (LPV/r)         High-Level Resistance e daily.    Drug resistance interpretation: RT HIVDB 9.0 (2019-10-17)   NRTI Resistance Mutations:   M41L, M184V, L210W, T215Y NNRTI Resistance Mutations: K103N, Y181C, G190A   Other Mutations: None     Nucleoside Reverse Transcriptase Inhibitors abacavir (ABC)           High-Level Resistance zidovudine (AZT)        High-Level Resistance emtricitabine (FTC)     High-Level Resistance lamivudine (3TC)        High-Level Resistance tenofovir (TDF)            Intermediate Resistance Non-nucleoside Reverse Transcriptase Inhibitors doravirine (DOR)         Intermediate Resistance efavirenz (EFV)           High-Level Resistance etravirine (ETR)          Intermediate Resistance nevirapine (NVP)        High-Level Resistance rilpivirine (RPV)           High-Level Resistance   [name] continued to not take the DESCOVY which she blames for some side effects that are clearly not related to this drug.  She did continue to take Prezista 600 mg with crushed 100 mg Norco tablets twice daily along with TIVICAY once daily.   As we last saw her she has stopped filling her antiretrovirals altogether.  Tells me that she is going to take 1 pill once a day.  Unfortunately only pill that we could do 1 pill once a day would be  BIKTARVY and this would only have 1 fully active drug.  That being said I am willing to try this with very close monitoring.  She is also interested in long-acting treatment though with long-acting we would have to go down the route of Cabotegravir + Lenacapravir and she would HAVE TO MAKE HER APPTS and not go missing for months     Past Medical History:  Diagnosis Date   Anxiety    Arthritis    Cancer    CVA (cerebral infarction)    Eye swelling 07/31/2021   GERD (gastroesophageal reflux disease)    Gout    HIV infection    HTN (hypertension)    Hyperlipidemia    Morbid obesity with BMI of 40.0-44.9, adult    Postmenopausal bleeding     Stroke    no residual paralysis    Past Surgical History:  Procedure Laterality Date   COLONOSCOPY  2000   HYSTEROSCOPY WITH D & C N/A 04/19/2013   Procedure: DILATATION AND CURETTAGE /HYSTEROSCOPY;  Surgeon: Emily Filbert, MD;  Location: [facility];  Service: Gynecology;  Laterality: N/A;   LYMPH NODE BIOPSY Right 09/17/2020   Procedure: LYMPH NODE BIOPSY; INGUINAL;  Surgeon: Virl Cagey, MD;  Location: [facility];  Service: General;  Laterality: Right;    Family History  Problem Relation Age of Onset   Hypertension Mother    Hypertension Sister    Arthritis Sister    Cancer Other    Diabetes Other       Social History   Socioeconomic History   Marital status: Widowed    Spouse name: Not on file   Number of children: Not on file   Years of education: Not on file   Highest education level: 10th grade  Occupational History   Not on file  Tobacco Use   Smoking status: Former    Packs/day: 1.00    Years: 5.00    Additional pack years: 0.00    Total pack years: 5.00    Types: Cigarettes   Smokeless tobacco: Never  Vaping Use   Vaping Use: Never used  Substance and Sexual Activity   Alcohol use: No   Drug use: No   Sexual activity: Not Currently    Birth control/protection: None, Post-menopausal  Other Topics Concern   Not on file  Social History Narrative   Grew up in [place], [place].   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   2 children, boy and girl. Son is 30s, and special needs.   Daughter in her 29s, is a Marine scientist.   Eats all food groups.   Wears seatbelt.   Attends church.   Enjoys watching tv, listening to [employer], and spending time with friends.   Enjoys going out to eat.    Social Determinants of Health   Financial Resource Strain: Low Risk  (06/27/2020)   Overall Financial Resource Strain (CARDIA)    Difficulty of Paying Living Expenses: Not hard at all  Food Insecurity: No Food Insecurity (06/27/2020)   Hunger Vital Sign    Worried  About Running Out of Food in the Last Year: Never true    Ran Out of Food in the Last Year: Never true  Transportation Needs: No Transportation Needs (06/27/2020)   PRAPARE - Hydrologist (Medical): No    Lack of Transportation (Non-Medical): No  Physical Activity: Sufficiently Active (06/27/2020)   Exercise Vital Sign    Days of Exercise  per Week: 7 days    Minutes of Exercise per Session: 60 min  Stress: Stress Concern Present (07/03/2020)   [place]    Feeling of Stress : To some extent  Social Connections: Moderately Isolated (06/27/2020)   Social Connection and Isolation Panel [NHANES]    Frequency of Communication with Friends and Family: More than three times a week    Frequency of Social Gatherings with Friends and Family: Once a week    Attends Religious Services: More than 4 times per year    Active Member of [employer] or Organizations: No    Attends Archivist Meetings: Never    Marital Status: Widowed    Allergies  Allergen Reactions   Gadolinium Derivatives Nausea And Vomiting    Vomited with multihance 12/05/19 Vomited with multihance 12/05/19   Amlodipine Other (See Comments)   Ivp Dye [Iodinated Contrast Media] Other (See Comments)    Caused kidney damage   Penicillins Swelling   Tape Dermatitis   Cephalexin Rash   Latex Rash     Current Outpatient Medications:    acetaminophen (TYLENOL) 325 MG tablet, , Disp: , Rfl:    allopurinol (ZYLOPRIM) 100 MG tablet, Prescription ran out, Disp: , Rfl:    ascorbic acid (VITAMIN C) 500 MG tablet, Take 1 tablet by mouth daily., Disp: , Rfl:    aspirin (PX ENTERIC ASPIRIN) 81 MG EC tablet, Take 1 tablet (81 mg total) by mouth daily. Swallow whole., Disp: 30 tablet, Rfl: 12   Aspirin-Salicylamide-Caffeine (BC HEADACHE POWDER PO), Take by mouth., Disp: , Rfl:    atorvastatin (LIPITOR) 10 MG tablet, Take 1 tablet by mouth daily.,  Disp: , Rfl:    bictegravir-emtricitabine-tenofovir AF (BIKTARVY) 50-200-25 MG TABS tablet, Take 1 tablet by mouth daily. Patient needs to make her appt with Dr. [name] to get further prescriptions, this is the ONLY medicine she will be on for now, Disp: 30 tablet, Rfl: 0   doxycycline (VIBRAMYCIN) 100 MG capsule, Take 1 capsule (100 mg total) by mouth 2 (two) times daily., Disp: 20 capsule, Rfl: 0   levothyroxine (SYNTHROID) 25 MCG tablet, Take 25 mcg by mouth every morning., Disp: , Rfl:    metoprolol succinate (TOPROL-XL) 25 MG 24 hr tablet, Take 25 mg by mouth at bedtime., Disp: , Rfl:    olmesartan (BENICAR) 20 MG tablet, Take 40 mg by mouth daily., Disp: , Rfl:    pantoprazole (PROTONIX) 40 MG tablet, Take 40 mg by mouth daily., Disp: , Rfl:    tetrahydrozoline 0.05 % ophthalmic solution, Place 1 drop into both eyes daily as needed (red eyes)., Disp: , Rfl:    triamterene-hydrochlorothiazide (MAXZIDE-25) 37.5-25 MG tablet, Take 1 tablet by mouth daily., Disp: , Rfl:    Vitamin D, Ergocalciferol, (DRISDOL) 1.25 MG (50000 UNIT) CAPS capsule, Take 50,000 Units by mouth every 7 (seven) days., Disp: , Rfl:     Review of Systems  Constitutional:  Negative for activity change, appetite change, chills, diaphoresis, fatigue, fever and unexpected weight change.  HENT:  Negative for congestion, ear pain, rhinorrhea, sinus pressure, sneezing, sore throat and trouble swallowing.   Eyes:  Negative for photophobia and visual disturbance.  Respiratory:  Negative for cough, chest tightness, shortness of breath, wheezing and stridor.   Cardiovascular:  Negative for chest pain, palpitations and leg swelling.  Gastrointestinal:  Negative for abdominal distention, abdominal pain, anal bleeding, blood in stool, constipation, diarrhea, nausea and vomiting.  Genitourinary:  Negative for difficulty urinating, dysuria, flank pain and hematuria.  Musculoskeletal:  Negative for arthralgias, back pain, gait problem,  joint swelling and myalgias.  Skin:  Positive for rash. Negative for color change, pallor and wound.  Neurological:  Negative for dizziness, tremors, weakness and light-headedness.  Hematological:  Negative for adenopathy. Does not bruise/bleed easily.  Psychiatric/Behavioral:  Negative for agitation, behavioral problems, confusion, decreased concentration, dysphoric mood and sleep disturbance.        Objective:   Physical Exam Constitutional:      General: She is not in acute distress.    Appearance: Normal appearance. She is well-developed. She is not ill-appearing or diaphoretic.  HENT:     Head: Normocephalic and atraumatic.     Right Ear: Hearing and external ear normal.     Left Ear: Hearing and external ear normal.     Nose: No nasal deformity or rhinorrhea.  Eyes:     General: No scleral icterus.    Conjunctiva/sclera: Conjunctivae normal.     Right eye: Right conjunctiva is not injected.     Left eye: Left conjunctiva is not injected.     Pupils: Pupils are equal, round, and reactive to light.  Neck:     Vascular: No JVD.  Cardiovascular:     Rate and Rhythm: Normal rate and regular rhythm.     Heart sounds: Normal heart sounds, S1 normal and S2 normal. No murmur heard.    No friction rub.  Abdominal:     General: Bowel sounds are normal. There is no distension.     Palpations: Abdomen is soft.     Tenderness: There is no abdominal tenderness.  Musculoskeletal:        General: Normal range of motion.     Right shoulder: Normal.     Left shoulder: Normal.     Cervical back: Normal range of motion and neck supple.     Right hip: Normal.     Left hip: Normal.     Right knee: Normal.     Left knee: Normal.  Lymphadenopathy:     Head:     Right side of head: No submandibular, preauricular or posterior auricular adenopathy.     Left side of head: No submandibular, preauricular or posterior auricular adenopathy.     Cervical: No cervical adenopathy.     Right  cervical: No superficial or deep cervical adenopathy.    Left cervical: No superficial or deep cervical adenopathy.  Skin:    General: Skin is warm and dry.     Coloration: Skin is not pale.     Findings: No abrasion, bruising, ecchymosis, erythema, lesion or rash.     Nails: There is no clubbing.  Neurological:     Mental Status: She is alert and oriented to person, place, and time.     Sensory: No sensory deficit.     Coordination: Coordination normal.     Gait: Gait normal.  Psychiatric:        Attention and Perception: Attention normal. She is attentive.        Mood and Affect: Mood and affect normal.        Speech: Speech normal.        Behavior: Behavior normal. Behavior is cooperative.        Thought Content: Thought content normal.        Judgment: Judgment normal.           A/P  Multidrug resistant HIV:   I check  an HIV with reflex to genotype today was well CD4 count.  I am willing to trial her on Biktarvy alone keeping in mind there is only 1 fully active drug but there is data to support it working in a patient such as her if she will actually take the medicines consistently.  I will have her come back and follow-up with me prior to 1 month ending.  In the interim we can also work on procuring long-acting LEN and CAB though I have anxiety that she could fail this if she fails to show up for her appts   HTN: better controlled Vitals:   11/24/22 1050  BP: 137/79  Pulse: 80  Resp: 16  SpO2: 100%   Hyperlipidemia: She has been prescribed atorvastatin but does not appear to have been feeling it recently  I spent 42 minutes with the patient including than 50% of the time in face to face counseling of the patient and her son regarding her HIV disease or multidrug-resistant virus options for treatment we have as far as pills and long-acting treatments along with review of medical records in preparation for the visit and during the visit and in coordination of her  care.

## 2022-11-25 ENCOUNTER — Other Ambulatory Visit (HOSPITAL_COMMUNITY): Payer: Self-pay

## 2022-11-25 NOTE — Progress Notes (Signed)
Office Visit Note  Patient: Laura Jimenez             Date of Birth: 1951-04-05           MRN: [id number]             PCP: Aliene Beams, MD Referring: Aliene Beams, MD Visit Date: 12/09/2022 Occupation: @  Subjective:  Pain in right foot  History of Present Illness: Laura Jimenez is a 72 y.o. female seen in consultation per request of her PCP for the evaluation of gout.  According the patient her symptoms started about 2 years ago in her foot.  She states that initially she was having flares every 6 months and then it became more frequent.  She states her symptoms are related to consuming red meat and shellfish.  She does not drink any alcohol.  She was seen by an orthopedic surgeon was started on colchicine which she was taking on as needed basis.  She states then she was seen by her PCP was started on allopurinol.  She does not like taking allopurinol and stopped allopurinol.  She has not been taking colchicine.  She states recently she has been having a flare in her right foot.  Patient states she was given prednisone for the right foot pain which resolved.  She wants to do dietary modifications to control gout.  She states she had rash on her abdomen for many years.  She was referred to dermatologist who did the skin biopsy.  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  She states she did not have a follow-up with the dermatologist her PCP did not tell her that she had cutaneous lupus.  She states that her daughter who is a Engineer, civil (consulting) gave her some topical agent which has helped with the rash.    Activities of Daily Living:  Patient reports morning stiffness for 0 minutes.   Patient Reports nocturnal pain.  Difficulty dressing/grooming: Denies Difficulty climbing stairs: Denies Difficulty getting out of chair: Denies Difficulty using hands for taps, buttons, cutlery, and/or writing: Denies  Review of Systems  Constitutional:   Positive for fatigue.  HENT:  Positive for mouth dryness. Negative for mouth sores.   Eyes:  Positive for dryness. Negative for photophobia.  Respiratory:  Negative for difficulty breathing.   Cardiovascular:  Negative for chest pain and palpitations.  Gastrointestinal:  Negative for blood in stool, constipation and diarrhea.  Endocrine: Negative for increased urination.  Genitourinary:  Negative for involuntary urination.  Musculoskeletal:  Positive for joint pain, joint pain, joint swelling, myalgias, morning stiffness, muscle tenderness and myalgias. Negative for gait problem and muscle weakness.  Skin:  Positive for rash. Negative for color change, hair loss and sensitivity to sunlight.  Allergic/Immunologic: Negative for susceptible to infections.  Neurological:  Negative for dizziness and headaches.  Hematological:  Negative for swollen glands.  Psychiatric/Behavioral:  Negative for depressed mood and sleep disturbance. The patient is not nervous/anxious.      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   History of CVA (cerebrovascular accident) 11/13/2011   Post-menopausal bleeding 11/13/2011   HIV INFECTION 09/08/2006   CANDIDIASIS, VAGINAL 09/08/2006   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   PERIPHERAL NEUROPATHY 09/08/2006   Essential hypertension 09/08/2006   REFLUX ESOPHAGITIS 09/08/2006   OVARIAN CYST 09/08/2006   DYSPLASIA, CERVIX NOS 09/08/2006   FOLLICULITIS  09/08/2006   Enlarged lymph nodes 09/08/2006   PAP SMEAR, ABNORMAL 10/09/2000    Past Medical History:  Diagnosis Date   Anxiety    Arthritis    Cancer    CVA (cerebral infarction)    Eye swelling 07/31/2021   GERD (gastroesophageal reflux disease)    Gout    HIV infection    HTN (hypertension)    Hyperlipidemia    Morbid obesity with BMI of 40.0-44.9, adult    Postmenopausal bleeding    Stroke    no residual paralysis    Family History  Problem Relation Age of Onset   Hypertension Mother    Hypertension Sister    Arthritis Sister    Cancer Other    Diabetes Other    Healthy Daughter    Past Surgical History:  Procedure Laterality Date   COLONOSCOPY  2000   HYSTEROSCOPY WITH D & C N/A 04/19/2013   Procedure: DILATATION AND CURETTAGE /HYSTEROSCOPY;  Surgeon: Allie Bossier, MD;  Location: [facility];  Service: Gynecology;  Laterality: N/A;   LYMPH NODE BIOPSY Right 09/17/2020   Procedure: LYMPH NODE BIOPSY; INGUINAL;  Surgeon: Lucretia Roers, MD;  Location: [facility];  Service: General;  Laterality: Right;   Social History   Social History Narrative   Grew up in [place], [place].   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   2 children, boy and girl. Son is 40s, and special needs.   Daughter in her 65s, is a Engineer, civil (consulting).   Eats all food groups.   Wears seatbelt.   Attends church.   Enjoys watching tv, listening to [employer], and spending time with friends.   Enjoys going out to eat.    Immunization History  Administered Date(s) Administered   Influenza Split 06/08/2010, 05/26/2012, 05/31/2013, 05/29/2014, 05/23/2015   Influenza Whole 05/26/2005   Influenza, High Dose Seasonal PF 05/12/2018, 05/28/2020   Influenza, Quadrivalent, Recombinant, Inj, Pf 09/03/2016, 05/21/2017   Influenza,inj,Quad PF,6+ Mos 09/03/2016   Influenza-Unspecified 06/08/2010, 05/26/2012, 05/31/2013, 05/29/2014, 05/23/2015, 09/03/2016, 05/21/2017, 05/30/2020, 05/22/2021   Moderna Covid-19  Vaccine Bivalent Booster 76yrs & up 06/19/2021   PFIZER(Purple Top)SARS-COV-2 Vaccination 10/08/2019, 11/01/2019, 07/26/2020   PPD Test 06/08/2009   Pneumococcal Conjugate-13 03/20/2014   Pneumococcal Polysaccharide-23 05/31/2013, 04/27/2019   Pneumococcal-Unspecified 05/31/2013, 03/20/2014   Tdap 03/20/2014, 09/25/2017   Zoster, Live 11/21/2020     Objective: Vital Signs: BP (!) 148/80 (BP Location: Left Arm, Patient Position: Sitting, Cuff Size: Large)   Pulse 65   Resp 17   Ht 5\' 4"  (1.626 m)   Wt 259 lb 12.8 oz (117.8 kg)   BMI 44.59 kg/m    Physical Exam Vitals and nursing note reviewed.  Constitutional:      Appearance: She is well-developed.  HENT:     Head: Normocephalic and atraumatic.  Eyes:     Conjunctiva/sclera: Conjunctivae normal.  Cardiovascular:     Rate and Rhythm: Normal rate and regular rhythm.     Heart sounds: Normal heart sounds.  Pulmonary:     Effort: Pulmonary effort is normal.  Breath sounds: Normal breath sounds.  Abdominal:     General: Bowel sounds are normal.     Palpations: Abdomen is soft.  Musculoskeletal:     Cervical back: Normal range of motion.  Lymphadenopathy:     Cervical: No cervical adenopathy.  Skin:    General: Skin is warm and dry.     Capillary Refill: Capillary refill takes less than 2 seconds.     Comments: Hyperpigmented macules were noted on the abdomen.  Neurological:     Mental Status: She is alert and oriented to person, place, and time.  Psychiatric:        Behavior: Behavior normal.      Musculoskeletal Exam: Cervical spine was in good range of motion.  She had discomfort with range of motion of her lumbar spine.  Her right shoulder joint was in full range of motion.  She had very limited range of motion of her left shoulder joint.  Elbow joints, wrist joints, MCPs PIPs and DIPs were in good range of motion with no synovitis.  Hip joints and knee joints were in good range of motion.  There was no tenderness  over ankles or MTPs.  CDAI Exam: CDAI Score: -- Patient Global: --; Provider Global: -- Swollen: --; Tender: -- Joint Exam 12/09/2022   No joint exam has been documented for this visit   There is currently no information documented on the homunculus. Go to the Rheumatology activity and complete the homunculus joint exam.  Investigation: No additional findings.  Imaging: No results found.  Recent Labs: Lab Results  Component Value Date   WBC 4.9 11/24/2022   HGB 12.4 11/24/2022   PLT 167 11/24/2022   NA 136 11/24/2022   K 4.0 11/24/2022   CL 103 11/24/2022   CO2 27 11/24/2022   GLUCOSE 96 11/24/2022   BUN 22 11/24/2022   CREATININE 1.06 (H) 11/24/2022   BILITOT 0.5 11/24/2022   ALKPHOS 74 09/11/2021   AST 23 11/24/2022   ALT 14 11/24/2022   PROT 9.1 (H) 11/24/2022   ALBUMIN 4.1 09/11/2021   CALCIUM 9.7 11/24/2022   GFRAA 75 01/04/2021   QFTBGOLDPLUS NEGATIVE 06/04/2020    Speciality Comments: No specialty comments available.  Procedures:  No procedures performed Allergies: Gadolinium derivatives, Amlodipine, Ivp dye [iodinated contrast media], Penicillins, Tape, Cephalexin, and Latex   Assessment / Plan:     Visit Diagnoses: Idiopathic chronic gout of multiple sites without tophus - History of recurrent chronic gout of the foot May 01, 2022 uric acid 7.6 -patient reports that she has been having gout flares for the last 2 years.  She states that flares were infrequent initially but now they have become more frequent.  The last flare was in her right first toe which responded to prednisone taper given by her PCP.  She was given colchicine in the past which she used to take on as needed basis.  Then she was given a prescription for allopurinol.  She states she was taking it initially but she did not like when the dose of allopurinol was increased as she believes that higher dose of allopurinol is not good for her.  I did detailed discussion with the patient.  She is  currently not taking colchicine or allopurinol.  We discussed starting on colchicine 0.6 mg, half tablet p.o. daily for the prevention of gout.  We discussed that she may also initiate allopurinol 100 mg p.o. daily.  The dose of allopurinol could be adjusted in the future  based on her uric acid level.  Patient refused to take allopurinol.  Patient states that allopurinol caused hypertension.  Will obtain labs today.  Side effects of both medications were discussed at length.  A handout was placed in the AVS for her review.  Dietary modifications were also discussed.  Plan: CBC with Differential/Platelet, COMPLETE METABOLIC PANEL WITH GFR  Pain in right foot -patient states the right foot pain is started last week.  It responded to prednisone taper.  Notes swelling was noted on the examination today.  I offered x-rays of her bilateral feet today which she declined.  Plan: Sedimentation rate, Uric acid, Rheumatoid factor, Cyclic citrul peptide antibody, IgG  Chronic left shoulder pain -patient had very minimal range of motion of her left shoulder joint.  Patient states she is followed by Dr. Debby Bud and will have surgery in the near future.  Patient is followed by Dr. Debby Bud  DDD (degenerative disc disease), lumbar-she is off and on discomfort in the lower back.  I reviewed MRI of her lumbar spine from September 05, 2020 which was consistent with degenerative disc disease.  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  She was referred to the dermatologist who did biopsy.  She states she was told by her PCP and ID doctor that she has cutaneous lupus.  I do not have the results to confirm the diagnosis.  I advised patient to follow-up with the dermatologist.  I will obtain following labs today.  Plan: ANA, RNP Antibody, Anti-Smith antibody, Sjogrens syndrome-A extractable nuclear antibody, Sjogrens syndrome-B extractable nuclear antibody, Anti-DNA antibody,  double-stranded, C3 and C4, Anti-scleroderma antibody, Beta-2 glycoprotein antibodies, Cardiolipin antibodies, IgG, IgM, IgA  Age-related osteoporosis without current pathological fracture - March 06, 2021 DEXA scan :[facility] Spine L1-L2 03/06/2021 68.1 Osteopenia -2.1 0.907 g/cm2 -11.9%.  Use of calcium rich diet and vitamin D was advised.  She had vitamin D deficiency in the past.  Vitamin D deficiency - February 14, 2022 vitamin D22.9.  Over-the-counter vitamin D was discussed.  Essential hypertension-blood pressure was rated at 163/89.  Repeat blood pressure was elevated at 157/85.  She was advised to monitor blood pressure closely and follow-up with the PCP.  History of CVA (cerebrovascular accident)  Hyperlipidemia, mixed  Vulvar cancer  MGUS (monoclonal gammopathy of unknown significance) - Followed by Dr. Leonides Schanz  Lymphadenopathy - Excisional biopsy consistent with HIV  HIV INFECTION - Multidrug resistant HIV, followed by Dr. Daiva Eves  History of esophageal reflux  Acquired hypothyroidism - History of thyroiditis  Anxiety and depression  Morbid obesity  Orders: Orders Placed This Encounter  Procedures   CBC with Differential/Platelet   COMPLETE METABOLIC PANEL WITH GFR   Sedimentation rate   Uric acid   Rheumatoid factor   Cyclic citrul peptide antibody, IgG   ANA   RNP Antibody   Anti-Smith antibody   Sjogrens syndrome-A extractable nuclear antibody   Sjogrens syndrome-B extractable nuclear antibody   Anti-DNA antibody, double-stranded   C3 and C4   Anti-scleroderma antibody   Beta-2 glycoprotein antibodies   Cardiolipin antibodies, IgG, IgM, IgA   Meds ordered this encounter  Medications   colchicine 0.6 MG tablet    Sig: Take 0.5 tablets (0.3 mg total) by mouth daily.    Dispense:  45 tablet    Refill:  0     Follow-Up Instructions: Return for Gout.   Pollyann Savoy, MD  Note - This record has been created using  Animal nutritionist.  Chart creation errors  have been sought, but may not always  have been located. Such creation errors do not reflect on  the standard of medical care.

## 2022-11-26 LAB — T-HELPER CELLS (CD4) COUNT (NOT AT ARMC)
CD4 % Helper T Cell: 10 % — ABNORMAL LOW (ref 33–65)
CD4 T Cell Abs: 224 /uL — ABNORMAL LOW (ref 400–1790)

## 2022-11-28 LAB — CBC WITH DIFFERENTIAL/PLATELET
Absolute Monocytes: 402 cells/uL (ref 200–950)
Basophils Absolute: 49 cells/uL (ref 0–200)
Basophils Relative: 1 %
Eosinophils Absolute: 69 cells/uL (ref 15–500)
Eosinophils Relative: 1.4 %
HCT: 38.3 % (ref 35.0–45.0)
Hemoglobin: 12.4 g/dL (ref 11.7–15.5)
Lymphs Abs: 2626 cells/uL (ref 850–3900)
MCH: 29.6 pg (ref 27.0–33.0)
MCHC: 32.4 g/dL (ref 32.0–36.0)
MCV: 91.4 fL (ref 80.0–100.0)
MPV: 10.7 fL (ref 7.5–12.5)
Monocytes Relative: 8.2 %
Neutro Abs: 1754 cells/uL (ref 1500–7800)
Neutrophils Relative %: 35.8 %
Platelets: 167 10*3/uL (ref 140–400)
RBC: 4.19 10*6/uL (ref 3.80–5.10)
RDW: 13.5 % (ref 11.0–15.0)
Total Lymphocyte: 53.6 %
WBC: 4.9 10*3/uL (ref 3.8–10.8)

## 2022-11-28 LAB — COMPLETE METABOLIC PANEL WITH GFR
AG Ratio: 0.8 (calc) — ABNORMAL LOW (ref 1.0–2.5)
ALT: 14 U/L (ref 6–29)
AST: 23 U/L (ref 10–35)
Albumin: 4 g/dL (ref 3.6–5.1)
Alkaline phosphatase (APISO): 65 U/L (ref 37–153)
BUN/Creatinine Ratio: 21 (calc) (ref 6–22)
BUN: 22 mg/dL (ref 7–25)
CO2: 27 mmol/L (ref 20–32)
Calcium: 9.7 mg/dL (ref 8.6–10.4)
Chloride: 103 mmol/L (ref 98–110)
Creat: 1.06 mg/dL — ABNORMAL HIGH (ref 0.50–1.05)
Globulin: 5.1 g/dL (calc) — ABNORMAL HIGH (ref 1.9–3.7)
Glucose, Bld: 96 mg/dL (ref 65–99)
Potassium: 4 mmol/L (ref 3.5–5.3)
Sodium: 136 mmol/L (ref 135–146)
Total Bilirubin: 0.5 mg/dL (ref 0.2–1.2)
Total Protein: 9.1 g/dL — ABNORMAL HIGH (ref 6.1–8.1)
eGFR: 57 mL/min/{1.73_m2} — ABNORMAL LOW (ref >=60)

## 2022-11-28 LAB — HIV RNA, RTPCR W/R GT (RTI, PI,INT)
HIV 1 RNA Quant: 335 copies/mL — ABNORMAL HIGH
HIV-1 RNA Quant, Log: 2.53 Log copies/mL — ABNORMAL HIGH

## 2022-11-28 LAB — RPR: RPR Ser Ql: NONREACTIVE

## 2022-12-08 ENCOUNTER — Other Ambulatory Visit (HOSPITAL_COMMUNITY): Payer: Self-pay

## 2022-12-08 ENCOUNTER — Other Ambulatory Visit: Payer: Self-pay | Admitting: Pharmacist

## 2022-12-08 ENCOUNTER — Other Ambulatory Visit: Payer: Self-pay

## 2022-12-08 DIAGNOSIS — B20 Human immunodeficiency virus [HIV] disease: Secondary | ICD-10-CM

## 2022-12-08 MED ORDER — LENACAPAVIR SODIUM 4 X 300 MG PO TBPK
2.0000 | ORAL_TABLET | ORAL | 0 refills | Status: DC
  Filled 2022-12-08: qty 4, fill #0
  Filled 2022-12-08: qty 4, 15d supply, fill #0

## 2022-12-08 MED ORDER — LENACAPAVIR SODIUM 463.5 MG/1.5ML ~~LOC~~ SOLN
927.0000 mg | SUBCUTANEOUS | 2 refills | Status: DC
  Filled 2022-12-08: qty 3, 180d supply, fill #0
  Filled 2022-12-08: qty 3, 90d supply, fill #0
  Filled 2023-06-01: qty 3, 90d supply, fill #1
  Filled 2023-08-07: qty 3, 90d supply, fill #2

## 2022-12-08 MED ORDER — CABOTEGRAVIR & RILPIVIRINE ER 600 & 900 MG/3ML IM SUER
1.0000 | INTRAMUSCULAR | 1 refills | Status: DC
  Filled 2022-12-08 (×2): qty 6, 30d supply, fill #0

## 2022-12-08 MED ORDER — CABOTEGRAVIR & RILPIVIRINE ER 600 & 900 MG/3ML IM SUER
1.0000 | INTRAMUSCULAR | 5 refills | Status: DC
  Filled 2022-12-08: qty 6, 60d supply, fill #0

## 2022-12-09 ENCOUNTER — Ambulatory Visit: Payer: 59 | Attending: Rheumatology | Admitting: Rheumatology

## 2022-12-09 ENCOUNTER — Encounter: Payer: Self-pay | Admitting: Rheumatology

## 2022-12-09 ENCOUNTER — Telehealth: Payer: Self-pay

## 2022-12-09 ENCOUNTER — Other Ambulatory Visit: Payer: Self-pay

## 2022-12-09 ENCOUNTER — Other Ambulatory Visit (HOSPITAL_COMMUNITY): Payer: Self-pay

## 2022-12-09 VITALS — BP 148/80 | HR 65 | Resp 17 | Ht 64.0 in | Wt 259.8 lb

## 2022-12-09 DIAGNOSIS — Z8673 Personal history of transient ischemic attack (TIA), and cerebral infarction without residual deficits: Secondary | ICD-10-CM | POA: Diagnosis not present

## 2022-12-09 DIAGNOSIS — M5136 Other intervertebral disc degeneration, lumbar region: Secondary | ICD-10-CM

## 2022-12-09 DIAGNOSIS — C519 Malignant neoplasm of vulva, unspecified: Secondary | ICD-10-CM

## 2022-12-09 DIAGNOSIS — E559 Vitamin D deficiency, unspecified: Secondary | ICD-10-CM | POA: Diagnosis not present

## 2022-12-09 DIAGNOSIS — M79671 Pain in right foot: Secondary | ICD-10-CM | POA: Diagnosis not present

## 2022-12-09 DIAGNOSIS — F419 Anxiety disorder, unspecified: Secondary | ICD-10-CM

## 2022-12-09 DIAGNOSIS — E782 Mixed hyperlipidemia: Secondary | ICD-10-CM

## 2022-12-09 DIAGNOSIS — D472 Monoclonal gammopathy: Secondary | ICD-10-CM | POA: Diagnosis not present

## 2022-12-09 DIAGNOSIS — G8929 Other chronic pain: Secondary | ICD-10-CM

## 2022-12-09 DIAGNOSIS — L932 Other local lupus erythematosus: Secondary | ICD-10-CM | POA: Diagnosis not present

## 2022-12-09 DIAGNOSIS — I10 Essential (primary) hypertension: Secondary | ICD-10-CM | POA: Diagnosis not present

## 2022-12-09 DIAGNOSIS — M1A09X0 Idiopathic chronic gout, multiple sites, without tophus (tophi): Secondary | ICD-10-CM | POA: Diagnosis not present

## 2022-12-09 DIAGNOSIS — F32A Depression, unspecified: Secondary | ICD-10-CM

## 2022-12-09 DIAGNOSIS — M25512 Pain in left shoulder: Secondary | ICD-10-CM | POA: Diagnosis not present

## 2022-12-09 DIAGNOSIS — Z8719 Personal history of other diseases of the digestive system: Secondary | ICD-10-CM

## 2022-12-09 DIAGNOSIS — M810 Age-related osteoporosis without current pathological fracture: Secondary | ICD-10-CM | POA: Diagnosis not present

## 2022-12-09 DIAGNOSIS — E039 Hypothyroidism, unspecified: Secondary | ICD-10-CM

## 2022-12-09 DIAGNOSIS — M19011 Primary osteoarthritis, right shoulder: Secondary | ICD-10-CM

## 2022-12-09 DIAGNOSIS — R591 Generalized enlarged lymph nodes: Secondary | ICD-10-CM

## 2022-12-09 DIAGNOSIS — B20 Human immunodeficiency virus [HIV] disease: Secondary | ICD-10-CM

## 2022-12-09 MED ORDER — COLCHICINE 0.6 MG PO TABS: 0.3000 mg | ORAL_TABLET | Freq: Every day | ORAL | 0 refills | Status: DC

## 2022-12-09 NOTE — Telephone Encounter (Signed)
[facility] Patient Adv[facility] Encounter  Patient's medication (Cabenuva) have been couriered to [facility] from [facility] and will be administered on the patient next office visit.     Elwyn Lowden , CPhT [facility] Phone: [phone] Fax:  [phone]  

## 2022-12-09 NOTE — Progress Notes (Signed)
Counseled patient on the purpose proper use, and adverse effects of colchicine.  Reviewed importance of avoiding alcohol. Discussed possible side effects of nausea, diarrhea which are dose-related. Discussed possible for muscle weakness after long period of use.  Discussed importance of low purine diet - avoiding alcohol, shellfish, red meats, etc.  Provided patient with medication education material and answered all questions.    Reviewed drug-drug interaction with Sunlenca which can increase colchicine levels  Patient's dose will be: 0.3 mg once daily. Rx has been sent to pharmacy today.  Discussed allopurinol at low dose for gout flare ppx however patient states that tis medication causes high blood pressure and she is not interested in this.   Chesley Mires, PharmD, MPH, BCPS, CPP Clinical Pharmacist (Rheumatology and Pulmonology)

## 2022-12-09 NOTE — Patient Instructions (Addendum)
Gout  Gout is painful swelling of your joints. Gout is a type of arthritis. It is caused by having too much uric acid in your body. Uric acid is a chemical that is made when your body breaks down substances called purines. If your body has too much uric acid, sharp crystals can form and build up in your joints. This causes pain and swelling. Gout attacks can happen quickly and be very painful (acute gout). Over time, the attacks can affect more joints and happen more often (chronic gout). What are the causes? Gout is caused by too much uric acid in your blood. This can happen because: Your kidneys do not remove enough uric acid from your blood. Your body makes too much uric acid. You eat too many foods that are high in purines. These foods include organ meats, some seafood, and beer. Trauma or stress can bring on an attack. What increases the risk? Having a family history of gout. Being female and middle-aged. Being female and having gone through menopause. Having an organ transplant. Taking certain medicines. Having certain conditions, such as: Being very overweight (obese). Lead poisoning. Kidney disease. A skin condition called psoriasis. Other risks include: Losing weight too quickly. Not having enough water in the body (being dehydrated). Drinking alcohol, especially beer. Drinking beverages that are sweetened with a type of sugar called fructose. What are the signs or symptoms? An attack of acute gout often starts at night and usually happens in just one joint. The most common place is the big toe. Other joints that may be affected include joints of the feet, ankle, knee, fingers, wrist, or elbow. Symptoms may include: Very bad pain. Warmth. Swelling. Stiffness. Tenderness. The affected joint may be very painful to touch. Shiny, red, or purple skin. Chills and fever. Chronic gout may cause symptoms more often. More joints may be involved. You may also have white or yellow lumps  (tophi) on your hands or feet or in other areas near your joints. How is this treated? Treatment for an acute attack may include medicines for pain and swelling, such as: NSAIDs, such as ibuprofen. Steroids taken by mouth or injected into a joint. Colchicine. This can be given by mouth or through an IV tube. Treatment to prevent future attacks may include: Taking small doses of NSAIDs or colchicine daily. Using a medicine that reduces uric acid levels in your blood, such as allopurinol. Making changes to your diet. You may need to see a food expert (dietitian) about what to eat and drink to prevent gout. Follow these instructions at home: During a gout attack  If told, put ice on the painful area. To do this: Put ice in a plastic bag. Place a towel between your skin and the bag. Leave the ice on for 20 minutes, 2-3 times a day. Take off the ice if your skin turns bright red. This is very important. If you cannot feel pain, heat, or cold, you have a greater risk of damage to the area. Raise the painful joint above the level of your heart as often as you can. Rest the joint as much as possible. If the joint is in your leg, you may be given crutches. Follow instructions from your doctor about what you cannot eat or drink. Avoiding future gout attacks Eat a low-purine diet. Avoid foods and drinks such as: Liver. Kidney. Anchovies. Asparagus. Herring. Mushrooms. Mussels. Beer. Stay at a healthy weight. If you want to lose weight, talk with your doctor. Do not   lose weight too fast. Start or continue an exercise plan as told by your doctor. Eating and drinking Avoid drinks sweetened by fructose. Drink enough fluids to keep your pee (urine) pale yellow. If you drink alcohol: Limit how much you have to: 0-1 drink a day for women who are not pregnant. 0-2 drinks a day for men. Know how much alcohol is in a drink. In the [place] one drink equals one 12 oz bottle of beer (355 mL), one 5 oz  glass of wine (148 mL), or one 1 oz glass of hard liquor (44 mL). General instructions Take over-the-counter and prescription medicines only as told by your doctor. Ask your doctor if you should avoid driving or using machines while you are taking your medicine. Return to your normal activities when your doctor says that it is safe. Keep all follow-up visits. Where to find more information [employer]: www.niams.http://www.myers.net/ Contact a doctor if: You have another gout attack. You still have symptoms of a gout attack after 10 days of treatment. You have problems (side effects) because of your medicines. You have chills or a fever. You have burning pain when you pee (urinate). You have pain in your lower back or belly. Get help right away if: You have very bad pain. Your pain cannot be controlled. You cannot pee. Summary Gout is painful swelling of the joints. The most common site of pain is the big toe, but it can affect other joints. Medicines and avoiding some foods can help to prevent and treat gout attacks. This information is not intended to replace advice given to you by your health care provider. Make sure you discuss any questions you have with your health care provider. Document Revised: 05/15/2021 Document Reviewed: 05/15/2021 Elsevier Patient Education  2023 [employer]. Information for patients with Gout  Gout defined-Gout occurs when urate crystals accumulate in your joint causing the inflammation and intense pain of gout attack.  Urate crystals can form when you have high levels of uric acid in your blood.  Your body produces uric acid when it breaks down prurines-substances that are found naturally in your body, as well as in certain foods such as organ meats, anchioves, herring, asparagus, and mushrooms.  Normally uric acid dissolves in your blood and passes through your kidneys into your urine.  But sometimes your body either produces too much uric acid or  your kidneys excrete too little uric acid.  When this happens, uric acid can build up, forming sharp needle-like urate crystals in a joint or surrounding tissue that cause pain, inflammation and swelling.    Gout is characterized by sudden, severe attacks of pain, redness and tenderness in joints, often the joint at the base of the big toe.  Gout is complex form of arthritis that can affect anyone.  Men are more likely to get gout but women become increasingly more susceptible to gout after menopause.  An acute attack of gout can wake you up in the middle of the night with the sensation that your big toe is on fire.  The affected joint is hot, swollen and so tender that even the weight or the sheet on it may seem intolerable.  If you experience symptoms of an acute gout attack it is important to your doctor as soon as the symptoms start.  Gout that goes untreated can lead to worsening pain and joint damage.  Risk Factors:  You are more likely to develop gout if you have high levels of  uric acid in your body.    Factors that increase the uric acid level in your body include:  Lifestyle factors.  Excessive alcohol use-generally more than two drinks a day for men and more than one for women increase the risk of gout.  Medical conditions.  Certain conditions make it more likely that you will develop gout.  These include hypertension, and chronic conditions such as diabetes, high levels of fat and cholesterol in the blood, and narrowing of the arteries.  Certain medications.  The uses of Thiazide diuretics- commonly used to treat hypertension and low dose aspirin can also increase uric acid levels.  Family history of gout.  If other members of your family have had gout, you are more likely to develop the disease.  Age and sex. Gout occurs more often in men than it does in women, primarily because women tend to have lower uric acid levels than men do.  Men are more likely to develop gout earlier  usually between the ages of 70-50- whereas women generally develop signs and symptoms after menopause.    Tests and diagnosis:  Tests to help diagnose gout may include:  Blood test.  Your doctor may recommend a blood test to measure the uric acid level in your blood .  Blood tests can be misleading, though.  Some people have high uric acid levels but never experience gout.  And some people have signs and symptoms of gout, but don't have unusual levels of uric acid in their blood.  Joint fluid test.  Your doctor may use a needle to draw fluid from your affected joint.  When examined under the microscope, your joint fluid may reveal urate crystals.  Treatment:  Treatment for gout usually involves medications.  What medications you and your doctor choose will be based on your current health and other medications you currently take.  Gout medications can be used to treat acute gout attacks and prevent future attacks as well as reduce your risk of complications from gout such as the development of tophi from urate crystal deposits.  Alternative medicine:   Certain foods have been studied for their potential to lower uric acid levels, including:  Coffee.  Studies have found an association between coffee drinking (regular and decaf) and lower uric acid levels.  The evidence is not enough to encourage non-coffee drinkers to start, but it may give clues to new ways of treating gout in the future.  Vitamin C.  Supplements containing vitamin C may reduce the levels of uric acid in your blood.  However, vitamin as a treatment for gout. Don't assume that if a little vitamin C is good, than lots is better.  Megadoses of vitamin C may increase your bodies uric acid levels.  Cherries.  Cherries have been associated with lower levels of uric acid in studies, but it isn't clear if they have any effect on gout signs and symptoms.  Eating more cherries and other dar-colored fruits, such as blackberries,  blueberries, purple grapes and raspberries, may be a safe way to support your gout treatment.    Lifestyle/Diet Recommendations:  Drink 8 to 16 cups ( about 2 to 4 liters) of fluid each day, with at least half being water. Avoid alcohol Eat a moderate amount of protein, preferably from healthy sources, such as low-fat or fat-free dairy, tofu, eggs, and nut butters. Limit you daily intake of meat, fish, and poultry to 4 to 6 ounces. Avoid high fat meats and desserts. Decrease you intake  of shellfish, beef, lamb, pork, eggs and cheese. Choose a good source of vitamin C daily such as citrus fruits, strawberries, broccoli,  brussel sprouts, papaya, and cantaloupe.  Choose a good source of vitamin A every other day such as yellow fruits, or dark green/yellow vegetables. Avoid drastic weigh reduction or fasting.  If weigh loss is desired lose it over a period of several months. See "dietary considerations.." chart for specific food recommendations.  Dietary Considerations for people with Gout  Food with negligible purine content (0-15 mg of purine nitrogen per 100 grams food)  May use as desired except on calorie variations  Non fat milk Cocoa Cereals (except in list II) Hard candies  Buttermilk Carbonated drinks Vegetables (except in list II) Sherbet  Coffee Fruits Sugar Honey  Tea Cottage Cheese Gelatin-jell-o Salt  Fruit juice Breads Angel food Cake   Herbs/spices Jams/Jellies [employer]    Foods that do not contain excessive purine content, but must be limited due to fat content  Cream Eggs Oil and Salad Dressing  Half and Half Peanut Butter Chocolate  Whole Milk Cakes Potato Chips  Butter Ice Cream Fried Foods  Cheese Nuts Waffles, pancakes   List II: Food with moderate purine content (50-150 mg of purine nitrogen per 100 grams of food)  Limit total amount each day to 5 oz. cooked Lean meat, other than those on list III   Poultry, other than those on list  III Fish, other than those on list III   Seafood, other than those on list III  These foods may be used occasionally  Peas Lentils Bran  Spinach Oatmeal Dried Beans and Peas  Asparagus Wheat Germ Mushrooms   Additional information about meat choices  Choose fish and poultry, particularly without skin, often.  Select lean, well trimmed cuts of meat.  Avoid all fatty meats, bacon , sausage, fried meats, fried fish, or poultry, luncheon meats, cold cuts, hot dogs, meats canned or frozen in gravy, spareribs and frozen and packaged prepared meats.   List III: Foods with HIGH purine content / Foods to AVOID (150-800 mg of purine nitrogen per 100 grams of food)  Anchovies Herring Meat Broths  Liver Mackerel Meat Extracts  Kidney Scallops Meat Drippings  Sardines Wild Game Mincemeat  Sweetbreads Goose Gravy  Heart Tongue Yeast, baker's and brewers   Commercial soups made with any of the foods listed in List II or List III  In addition avoid all alcoholic beverages    Colchicine Tablets What is this medication? COLCHICINE (KOL chi seen) prevents and treats gout attacks. It may also be used to treat familial Mediterranean fever (FMF). It works by decreasing inflammation and reducing the buildup of uric acid in your joints. This medicine may be used for other purposes; ask your health care provider or pharmacist if you have questions. COMMON BRAND NAME(S): Colcrys, LODOCO What should I tell my care team before I take this medication? They need to know if you have any of these conditions: Kidney disease Liver disease An unusual or allergic reaction to colchicine, other medications, foods, dyes, or preservatives Pregnant or trying to get pregnant Breast-feeding How should I use this medication? Take this medication by mouth with a full glass of water. Take it as directed on the prescription label at the same time every day. You can take it with or without food. If it upsets your stomach,  take it with food. A special MedGuide will be given to you by the pharmacist with  each prescription and refill. Be sure to read this information carefully each time. Talk to your care team about the use of this medication in children. While it may be prescribed for children as young as 4 years for selected conditions, precautions do apply. People 65 years and older may have a stronger reaction and need a smaller dose. Overdosage: If you think you have taken too much of this medicine contact a poison control center or emergency room at once. NOTE: This medicine is only for you. Do not share this medicine with others. What if I miss a dose? If you miss a dose, take it as soon as you can. If it is almost time for your next dose, take only that dose. Do not take double or extra doses. What may interact with this medication? Do not take this medication with any of the following: Certain antivirals for HIV or hepatitis This medication may also interact with the following: Certain antibiotics, such as erythromycin or clarithromycin Certain medications for blood pressure, heart disease, irregular heartbeat Certain medications for cholesterol, such as atorvastatin, lovastatin, or simvastatin Certain medications for fungal infections, such as ketoconazole, itraconazole, or posaconazole Cyclosporine Grapefruit or grapefruit juice This list may not describe all possible interactions. Give your health care provider a list of all the medicines, herbs, non-prescription drugs, or dietary supplements you use. Also tell them if you smoke, drink alcohol, or use illegal drugs. Some items may interact with your medicine. What should I watch for while using this medication? Visit your care team for regular checks on your progress. Tell your care team if your symptoms do not start to get better or if they get worse. You should make sure you get enough vitamin B12 while you are taking this medication. Discuss the foods  you eat and the vitamins you take with your care team. This medication may increase your risk to bruise or bleed. Call you care team if you notice any unusual bleeding. What side effects may I notice from receiving this medication? Side effects that you should report to your care team as soon as possible: Allergic reactions--skin rash, itching, hives, swelling of the face, lips, tongue, or throat Infection--fever, chills, cough, sore throat, wounds that don't heal, pain or trouble when passing urine, general feeling of discomfort or being unwell Muscle injury--unusual weakness or fatigue, muscle pain, dark yellow or brown urine, decrease in the amount of urine Pain, tingling, or numbness in the hands or feet Unusual bleeding or bruising Side effects that usually do not require medical attention (report these to your care team if they continue or are bothersome): Diarrhea Nausea Vomiting This list may not describe all possible side effects. Call your doctor for medical advice about side effects. You may report side effects to FDA at 1-800-FDA-1088. Where should I keep my medication? Keep out of the reach of children and pets. Store at room temperature between 15 and 30 degrees C (59 and 86 degrees F). Keep container tightly closed. Protect from light. Throw away any unused medication after the expiration date. NOTE: This sheet is a summary. It may not cover all possible information. If you have questions about this medicine, talk to your doctor, pharmacist, or health care provider.  2023 Elsevier/Gold Standard (2007-10-02 00:00:00)

## 2022-12-11 ENCOUNTER — Telehealth: Payer: Self-pay

## 2022-12-11 NOTE — Telephone Encounter (Signed)
Detectable Viral Load Intervention   Most recent VL:  HIV 1 RNA Quant  Date Value Ref Range Status  11/24/2022 335 (H) copies/mL Final  12/18/2021 649 (H) copies/mL Final  10/07/2021 389 (H) copies/mL Final    Current ART regimen: Biktarvy  Appointment status: patient has future appointment scheduled  Called patient to discuss medication adherence and possible barriers to care.   Medication last dispensed (per chart review): 11/25/22  Medication Adherence   What pharmacy do you use for your ART? [facility]  Do you pick up your medication at the pharmacy or is it mailed to you? pick up at pharmacy  How often do you miss a dose your ART? never or almost never  Are you experiencing any side effects with your ART? No  Since your last visit, have you experienced any changes that have negatively impacted your health? NO  Are you having any trouble remembering what medication(s) you are supposed to take or how you are supposed to take them? No  What helps you remember to take your medication(s)?    Barriers to Care   Are you experiencing any of the following?:  Lack of transportation to medical appointments? No  Housing instability? No  If you are currently employed, are you having difficulty taking time off of work for medical appointments? No  Financial concerns (rent, utilities, etc.) No  Lack of consistent access to food? No  Trouble remembering and attending your appointments? No  Are you experiencing any other barriers that make it hard for you to come to appointments or take medication regularly? No    Interventions: Reminded of future follow up appointment and discussed the importance of keeping scheduled appointment and taking medication daily as prescribed.   Valarie Cones, LPN

## 2022-12-12 ENCOUNTER — Telehealth: Payer: Self-pay

## 2022-12-12 NOTE — Telephone Encounter (Signed)
[facility] Patient Advocate Encounter  Patient's medications have been couriered to [facility] from [facility] and will be administered on the patient next office visit.  Sunlenca oral tablets Sunlenca injection   Clearance Coots , CPhT [facility] Pharmacy Patient [facility] Phone: [phone] Fax:  [phone]

## 2022-12-12 NOTE — Telephone Encounter (Signed)
Patient contacted the office and inquired where her Colchicine prescription was sent. Advised patient Colchicine was sent to [facility] on [address] in [place].

## 2022-12-14 LAB — BETA-2 GLYCOPROTEIN ANTIBODIES
Beta-2 Glyco 1 IgA: <2 U/mL (ref <20.0)
Beta-2 Glyco 1 IgM: <2 U/mL (ref <20.0)
Beta-2 Glyco I IgG: <2 U/mL (ref <20.0)

## 2022-12-14 LAB — CARDIOLIPIN ANTIBODIES, IGG, IGM, IGA
Anticardiolipin IgA: <2 APL-U/mL (ref <20.0)
Anticardiolipin IgG: <2 GPL-U/mL (ref <20.0)
Anticardiolipin IgM: <2 MPL-U/mL (ref <20.0)

## 2022-12-14 LAB — CBC WITH DIFFERENTIAL/PLATELET
Absolute Monocytes: 593 cells/uL (ref 200–950)
Basophils Absolute: 39 cells/uL (ref 0–200)
Basophils Relative: 0.5 %
Eosinophils Absolute: 46 cells/uL (ref 15–500)
Eosinophils Relative: 0.6 %
HCT: 33.8 % — ABNORMAL LOW (ref 35.0–45.0)
Hemoglobin: 11.1 g/dL — ABNORMAL LOW (ref 11.7–15.5)
Lymphs Abs: 3403 cells/uL (ref 850–3900)
MCH: 30.1 pg (ref 27.0–33.0)
MCHC: 32.8 g/dL (ref 32.0–36.0)
MCV: 91.6 fL (ref 80.0–100.0)
MPV: 10.1 fL (ref 7.5–12.5)
Monocytes Relative: 7.7 %
Neutro Abs: 3619 cells/uL (ref 1500–7800)
Neutrophils Relative %: 47 %
Platelets: 69 10*3/uL — ABNORMAL LOW (ref 140–400)
RBC: 3.69 10*6/uL — ABNORMAL LOW (ref 3.80–5.10)
RDW: 13.4 % (ref 11.0–15.0)
Total Lymphocyte: 44.2 %
WBC: 7.7 10*3/uL (ref 3.8–10.8)

## 2022-12-14 LAB — COMPLETE METABOLIC PANEL WITH GFR
AG Ratio: 0.8 (calc) — ABNORMAL LOW (ref 1.0–2.5)
ALT: 12 U/L (ref 6–29)
AST: 20 U/L (ref 10–35)
Albumin: 3.8 g/dL (ref 3.6–5.1)
Alkaline phosphatase (APISO): 64 U/L (ref 37–153)
BUN/Creatinine Ratio: 15 (calc) (ref 6–22)
BUN: 18 mg/dL (ref 7–25)
CO2: 27 mmol/L (ref 20–32)
Calcium: 9.4 mg/dL (ref 8.6–10.4)
Chloride: 104 mmol/L (ref 98–110)
Creat: 1.17 mg/dL — ABNORMAL HIGH (ref 0.50–1.05)
Globulin: 4.9 g/dL (calc) — ABNORMAL HIGH (ref 1.9–3.7)
Glucose, Bld: 93 mg/dL (ref 65–99)
Potassium: 3.7 mmol/L (ref 3.5–5.3)
Sodium: 137 mmol/L (ref 135–146)
Total Bilirubin: 0.5 mg/dL (ref 0.2–1.2)
Total Protein: 8.7 g/dL — ABNORMAL HIGH (ref 6.1–8.1)
eGFR: 51 mL/min/{1.73_m2} — ABNORMAL LOW (ref >=60)

## 2022-12-14 LAB — SEDIMENTATION RATE: Sed Rate: 67 mm/h — ABNORMAL HIGH (ref 0–30)

## 2022-12-14 LAB — SJOGRENS SYNDROME-A EXTRACTABLE NUCLEAR ANTIBODY: SSA (Ro) (ENA) Antibody, IgG: <1 AI

## 2022-12-14 LAB — ANA: Anti Nuclear Antibody (ANA): NEGATIVE

## 2022-12-14 LAB — CYCLIC CITRUL PEPTIDE ANTIBODY, IGG: Cyclic Citrullin Peptide Ab: <16 UNITS

## 2022-12-14 LAB — C3 AND C4
C3 Complement: 153 mg/dL (ref 83–193)
C4 Complement: 21 mg/dL (ref 15–57)

## 2022-12-14 LAB — RNP ANTIBODY: Ribonucleic Protein(ENA) Antibody, IgG: <1 AI

## 2022-12-14 LAB — SJOGRENS SYNDROME-B EXTRACTABLE NUCLEAR ANTIBODY: SSB (La) (ENA) Antibody, IgG: <1 AI

## 2022-12-14 LAB — ANTI-DNA ANTIBODY, DOUBLE-STRANDED: ds DNA Ab: <1 IU/mL

## 2022-12-14 LAB — ANTI-SMITH ANTIBODY: ENA SM Ab Ser-aCnc: <1 AI

## 2022-12-14 LAB — RHEUMATOID FACTOR: Rheumatoid fact SerPl-aCnc: <10 IU/mL (ref <14)

## 2022-12-14 LAB — ANTI-SCLERODERMA ANTIBODY: Scleroderma (Scl-70) (ENA) Antibody, IgG: <1 AI

## 2022-12-14 LAB — URIC ACID: Uric Acid, Serum: 8.6 mg/dL — ABNORMAL HIGH (ref 2.5–7.0)

## 2022-12-14 NOTE — Progress Notes (Signed)
Labs are consistent with gout.  All autoimmune workup is negative.  Please forward results to her PCP.  I will discuss results at the follow-up visit.

## 2022-12-22 ENCOUNTER — Ambulatory Visit (INDEPENDENT_AMBULATORY_CARE_PROVIDER_SITE_OTHER): Payer: 59 | Admitting: Infectious Disease

## 2022-12-22 ENCOUNTER — Encounter: Payer: Self-pay | Admitting: Infectious Disease

## 2022-12-22 ENCOUNTER — Other Ambulatory Visit: Payer: Self-pay

## 2022-12-22 VITALS — BP 165/81 | HR 63 | Temp 97.8°F | Ht 66.0 in | Wt 255.0 lb

## 2022-12-22 DIAGNOSIS — Z91199 Patient's noncompliance with other medical treatment and regimen due to unspecified reason: Secondary | ICD-10-CM | POA: Diagnosis not present

## 2022-12-22 DIAGNOSIS — B20 Human immunodeficiency virus [HIV] disease: Secondary | ICD-10-CM | POA: Diagnosis not present

## 2022-12-22 DIAGNOSIS — E785 Hyperlipidemia, unspecified: Secondary | ICD-10-CM

## 2022-12-22 DIAGNOSIS — I10 Essential (primary) hypertension: Secondary | ICD-10-CM

## 2022-12-22 NOTE — Progress Notes (Signed)
Subjective:  Chief complaint: Follow-up for HIV disease stating that she is taking BIKTARVY without trouble Patient ID: Laura Jimenez, female    DOB: September 03, 1950, 72 y.o.   MRN: [id number]  HPI   Laura Jimenez is a 72 year-old African-American lady diagnosed with HIV in the 1990s who has a history of multidrug-resistant HIV and intermittent adherence to medications.   While she was at [facility] she was on a regimen of twice daily boosted darunavir (prezista) with norvir along with emtricitabine and isentress BID and with VL <20 fairly consistently in 2016 period, then changed from Isentress to [facility] for simplification of her regimen with reduction of pill burden and also giving her an integrase strand transfer inhibitor with higher barrier to resistance.   The patient has told us that she done developed a rash while on TIVICAY.  Certainly I cannot find documentation of her having developed a rash when she was switched to [facility] and she remained undetectable through several visits on this regimen of TIVICAY once daily Emtricitabine  and boosted darunavir ritonavir twice daily.   She  subsequently [facility] and she is largely been viremic although she did get a viral load down to 653 and November 29 of 2021.   There was a very nice note written at [facility] which Dr. Renold Don had put into the chart which summarize all of the resistance mutations over time that the patient had documented since 2007.   I put those mutations back into [employer] and the results are below.        Drug resistance interpretation: PR   HIVDB 9.0 (2019-10-17) PI Major Resistance Mutations:   M46I, I54V, I84V, L90IMP   PI Accessory Resistance Mutations : G73S Other Mutations: L10I, L63P, A71V, V77I   Protease Inhibitors atazanavir/r (ATV/r)    High-Level Resistance darunavir/r (DRV/r)     Low-Level  Resistance lopinavir/r (LPV/r)        High-Level Resistance e daily.    Drug resistance interpretation: RT HIVDB 9.0 (2019-10-17)   NRTI Resistance Mutations:   M41L, M184V, L210W, T215Y NNRTI Resistance Mutations: K103N, Y181C, G190A   Other Mutations: None     Nucleoside Reverse Transcriptase Inhibitors abacavir (ABC)           High-Level Resistance zidovudine (AZT)        High-Level Resistance emtricitabine (FTC)     High-Level Resistance lamivudine (3TC)        High-Level Resistance tenofovir (TDF)            Intermediate Resistance Non-nucleoside Reverse Transcriptase Inhibitors doravirine (DOR)         Intermediate Resistance efavirenz (EFV)           High-Level Resistance etravirine (ETR)          Intermediate Resistance nevirapine (NVP)        High-Level Resistance rilpivirine (RPV)           High-Level Resistance   Laelia continued to not take the DESCOVY which she blames for some side effects that are clearly not related to this drug.  She did continue to take Prezista 600 mg with crushed 100 mg Norco tablets twice daily along with TIVICAY once daily.   As we last saw her she has stopped filling her antiretrovirals altogether.  Tells me that she is going to take 1 pill once a day.  Unfortunately only pill that we could do 1 pill  once a day would be BIKTARVY and this would only have 1 fully active drug.  That being said I am willing to try this with very close monitoring.  She is also interested in long-acting treatment though with long-acting we would have to go down the route of Cabotegravir + Lenacapravir and she wo is since here in the clinic ready to give to her.  However when she was presented with the size of the needle for injection she did not want to have them done today but still wants to think about it until her next visit.      Past Medical History:  Diagnosis Date   Anxiety    Arthritis    Cancer (HCC)    CVA (cerebral infarction)    Eye  swelling 07/31/2021   GERD (gastroesophageal reflux disease)    Gout    HIV infection (HCC)    HTN (hypertension)    Hyperlipidemia    Morbid obesity with BMI of 40.0-44.9, adult (HCC)    Postmenopausal bleeding    Stroke (HCC)    no residual paralysis    Past Surgical History:  Procedure Laterality Date   COLONOSCOPY  2000   HYSTEROSCOPY WITH D & C N/A 04/19/2013   Procedure: DILATATION AND CURETTAGE /HYSTEROSCOPY;  Surgeon: Allie Bossier, MD;  Location: [facility];  Service: Gynecology;  Laterality: N/A;   LYMPH NODE BIOPSY Right 09/17/2020   Procedure: LYMPH NODE BIOPSY; INGUINAL;  Surgeon: Lucretia Roers, MD;  Location: [facility];  Service: General;  Laterality: Right;    Family History  Problem Relation Age of Onset   Hypertension Mother    Hypertension Sister    Arthritis Sister    Cancer Other    Diabetes Other    Healthy Daughter       Social History   Socioeconomic History   Marital status: Widowed    Spouse name: Not on file   Number of children: Not on file   Years of education: Not on file   Highest education level: 10th grade  Occupational History   Not on file  Tobacco Use   Smoking status: Former    Packs/day: 1.00    Years: 5.00    Additional pack years: 0.00    Total pack years: 5.00    Types: Cigarettes    Passive exposure: Never   Smokeless tobacco: Never  Vaping Use   Vaping Use: Never used  Substance and Sexual Activity   Alcohol use: No   Drug use: No   Sexual activity: Not Currently    Birth control/protection: None, Post-menopausal  Other Topics Concern   Not on file  Social History Narrative   Grew up in [place], [place].   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   2 children, boy and girl. Son is 40s, and special needs.   Daughter in her 19s, is a Engineer, civil (consulting).   Eats all food groups.   Wears seatbelt.   Attends church.   Enjoys watching tv, listening to [employer], and spending time with friends.   Enjoys going out to eat.     Social Determinants of Health   Financial Resource Strain: Low Risk  (06/27/2020)   Overall Financial Resource Strain (CARDIA)    Difficulty of Paying Living Expenses: Not hard at all  Food Insecurity: No Food Insecurity (06/27/2020)   Hunger Vital Sign    Worried About Running Out of Food in the Last Year: Never true  Ran Out of Food in the Last Year: Never true  Transportation Needs: No Transportation Needs (06/27/2020)   PRAPARE - Administrator, Civil Service (Medical): No    Lack of Transportation (Non-Medical): No  Physical Activity: Sufficiently Active (06/27/2020)   Exercise Vital Sign    Days of Exercise per Week: 7 days    Minutes of Exercise per Session: 60 min  Stress: Stress Concern Present (07/03/2020)   [employer] of Occupational Health - Occupational Stress Questionnaire    Feeling of Stress : To some extent  Social Connections: Moderately Isolated (06/27/2020)   Social Connection and Isolation Panel [NHANES]    Frequency of Communication with Friends and Family: More than three times a week    Frequency of Social Gatherings with Friends and Family: Once a week    Attends Religious Services: More than 4 times per year    Active Member of [employer] or Organizations: No    Attends Banker Meetings: Never    Marital Status: Widowed    Allergies  Allergen Reactions   Gadolinium Derivatives Nausea And Vomiting    Vomited with multihance 12/05/19 Vomited with multihance 12/05/19   Amlodipine Other (See Comments)   Ivp Dye [Iodinated Contrast Media] Other (See Comments)    Caused kidney damage   Penicillins Swelling   Tape Dermatitis   Cephalexin Rash   Latex Rash     Current Outpatient Medications:    acetaminophen (TYLENOL) 325 MG tablet, as needed., Disp: , Rfl:    ascorbic acid (VITAMIN C) 500 MG tablet, Take 1 tablet by mouth daily., Disp: , Rfl:    aspirin (PX ENTERIC ASPIRIN) 81 MG EC tablet, Take 1 tablet (81 mg total) by mouth  daily. Swallow whole., Disp: 30 tablet, Rfl: 12   Aspirin-Salicylamide-Caffeine (BC HEADACHE POWDER PO), Take by mouth., Disp: , Rfl:    atorvastatin (LIPITOR) 10 MG tablet, Take 1 tablet by mouth daily., Disp: , Rfl:    bictegravir-emtricitabine-tenofovir AF (BIKTARVY) 50-200-25 MG TABS tablet, Take 1 tablet by mouth daily. Patient needs to make her appt with Dr. Daiva Eves to get further prescriptions, this is the ONLY medicine she will be on for now, Disp: 30 tablet, Rfl: 0   colchicine 0.6 MG tablet, Take 0.5 tablets (0.3 mg total) by mouth daily., Disp: 45 tablet, Rfl: 0   levothyroxine (SYNTHROID) 25 MCG tablet, Take 25 mcg by mouth every morning., Disp: , Rfl:    olmesartan (BENICAR) 20 MG tablet, Take 40 mg by mouth daily., Disp: , Rfl:    triamterene-hydrochlorothiazide (MAXZIDE-25) 37.5-25 MG tablet, Take 1 tablet by mouth daily., Disp: , Rfl:    Vitamin D, Ergocalciferol, (DRISDOL) 1.25 MG (50000 UNIT) CAPS capsule, Take 50,000 Units by mouth every 7 (seven) days., Disp: , Rfl:    allopurinol (ZYLOPRIM) 100 MG tablet, Prescription ran out (Patient not taking: Reported on 12/09/2022), Disp: , Rfl:    cabotegravir & rilpivirine ER (CABENUVA) 600 & 900 MG/3ML injection, Inject 1 kit into the muscle every 2 (two) months. (Patient not taking: Reported on 12/09/2022), Disp: 6 mL, Rfl: 5   cabotegravir & rilpivirine ER (CABENUVA) 600 & 900 MG/3ML injection, Inject 1 kit into the muscle every 30 (thirty) days. (Patient not taking: Reported on 12/09/2022), Disp: 6 mL, Rfl: 1   doxycycline (VIBRAMYCIN) 100 MG capsule, Take 1 capsule (100 mg total) by mouth 2 (two) times daily. (Patient not taking: Reported on 12/09/2022), Disp: 20 capsule, Rfl: 0   lenacapavir (SUNLENCA)  300 mg tablet, Take 2 tablets by mouth See admin instructions. This is the 2-day initiation kit: Take two 300-mg tablets on Day 1 & Day 2 with or without food. (Patient not taking: Reported on 12/09/2022), Disp: 4 each, Rfl: 0   metoprolol  succinate (TOPROL-XL) 25 MG 24 hr tablet, Take 25 mg by mouth at bedtime. (Patient not taking: Reported on 12/09/2022), Disp: , Rfl:    pantoprazole (PROTONIX) 40 MG tablet, Take 40 mg by mouth daily. (Patient not taking: Reported on 12/09/2022), Disp: , Rfl:    SQ injection lenacapavir (SUNLENCA) 463.5 MG/1.5ML SQ injection, Inject 3 mLs (927 mg total) into the skin every 6 (six) months. Administer each injection subcutaneously at separate sites in the abdomen (more or equal to 2 inches from the navel). (Patient not taking: Reported on 12/09/2022), Disp: 3 mL, Rfl: 2   tetrahydrozoline 0.05 % ophthalmic solution, Place 1 drop into both eyes daily as needed (red eyes). (Patient not taking: Reported on 12/22/2022), Disp: , Rfl:     Review of Systems  Constitutional:  Negative for activity change, appetite change, chills, diaphoresis, fatigue, fever and unexpected weight change.  HENT:  Negative for congestion, rhinorrhea, sinus pressure, sneezing, sore throat and trouble swallowing.   Eyes:  Negative for photophobia and visual disturbance.  Respiratory:  Negative for cough, chest tightness, shortness of breath, wheezing and stridor.   Cardiovascular:  Negative for chest pain, palpitations and leg swelling.  Gastrointestinal:  Negative for abdominal distention, abdominal pain, anal bleeding, blood in stool, constipation, diarrhea, nausea and vomiting.  Genitourinary:  Negative for difficulty urinating, dysuria, flank pain and hematuria.  Musculoskeletal:  Negative for arthralgias, back pain, gait problem, joint swelling and myalgias.  Skin:  Negative for color change, pallor, rash and wound.  Neurological:  Negative for dizziness, tremors, weakness, light-headedness and headaches.  Hematological:  Negative for adenopathy. Does not bruise/bleed easily.  Psychiatric/Behavioral:  Negative for agitation, behavioral problems, confusion, decreased concentration, dysphoric mood, sleep disturbance and suicidal  ideas.        Objective:   Physical Exam Constitutional:      General: She is not in acute distress.    Appearance: She is not diaphoretic.  HENT:     Head: Normocephalic and atraumatic.     Right Ear: External ear normal.     Left Ear: External ear normal.     Nose: Nose normal.     Mouth/Throat:     Pharynx: No oropharyngeal exudate.  Eyes:     General: No scleral icterus.       Right eye: No discharge.        Left eye: No discharge.     Extraocular Movements: Extraocular movements intact.     Conjunctiva/sclera: Conjunctivae normal.  Cardiovascular:     Rate and Rhythm: Normal rate and regular rhythm.  Pulmonary:     Effort: Pulmonary effort is normal. No respiratory distress.     Breath sounds: No wheezing or rales.  Abdominal:     General: There is no distension.     Palpations: Abdomen is soft.  Musculoskeletal:        General: No tenderness. Normal range of motion.     Cervical back: Normal range of motion and neck supple.  Lymphadenopathy:     Cervical: No cervical adenopathy.  Skin:    General: Skin is warm and dry.     Coloration: Skin is not jaundiced or pale.     Findings: No erythema, lesion or rash.  Neurological:     General: No focal deficit present.     Mental Status: She is alert and oriented to person, place, and time.     Coordination: Coordination normal.  Psychiatric:        Mood and Affect: Mood normal.        Behavior: Behavior normal.        Thought Content: Thought content normal.        Judgment: Judgment normal.           A/P  HIV disease:  I will check HIV RNA quant and if she is doing well continue her Biktarvy "monotherapy" while she considers LA ttherapy. It might be very helpful if she would agree even to LEN SQ along with daily Biktarvy  HTN: poorly controlled  Hyperlipidemia: continue atorvastatin  I have personally spent 42 minutes involved in face-to-face and non-face-to-face activities for this patient on the day  of the visit. Professional time spent includes the following activities: Preparing to see the patient (review of tests), Obtaining and/or reviewing separately obtained history (admission/discharge record), Performing a medically appropriate examination and/or evaluation , Ordering medications/tests/procedures, referring and communicating with other health care professionals, Documenting clinical information in the EMR, Independently interpreting results (not separately reported), Communicating results to the patient/family/caregiver, Counseling and educating the patient/family/caregiver and Care coordination (not separately reported).

## 2022-12-23 DIAGNOSIS — E063 Autoimmune thyroiditis: Secondary | ICD-10-CM | POA: Diagnosis not present

## 2022-12-23 DIAGNOSIS — E038 Other specified hypothyroidism: Secondary | ICD-10-CM | POA: Diagnosis not present

## 2022-12-23 NOTE — Progress Notes (Signed)
Office Visit Note  Patient: Laura Jimenez             Date of Birth: 1951/07/01           MRN: [id number]             PCP: Aliene Beams, MD Referring: Aliene Beams, MD Visit Date: 01/06/2023 Occupation: @GUAROCC @  Subjective:  Joint pain and rash  History of Present Illness: Laura Jimenez is a 72 y.o. female with history of gouty arthropathy and rash.  She states she had a flare in her right foot since the last visit which responded to prednisone taper.  She states she also has a prescription for colchicine which she takes it on as-needed basis.  She continues to consume red meat and shellfish.  She states she has rash on her abdomen for which she had seen dermatologist in the past but did not go for the follow-up visit.    Activities of Daily Living:  Patient reports morning stiffness for 0 minutes.   Patient Reports nocturnal pain.  Difficulty dressing/grooming: Denies Difficulty climbing stairs: Denies Difficulty getting out of chair: Denies Difficulty using hands for taps, buttons, cutlery, and/or writing: Denies  Review of Systems  Constitutional:  Negative for fatigue.  HENT:  Positive for mouth dryness. Negative for mouth sores.   Eyes:  Positive for dryness.  Respiratory:  Negative for shortness of breath.   Cardiovascular:  Negative for chest pain and palpitations.  Gastrointestinal:  Negative for blood in stool, constipation and diarrhea.  Endocrine: Negative for increased urination.  Genitourinary:  Negative for involuntary urination.  Musculoskeletal:  Positive for joint pain and joint pain. Negative for gait problem, joint swelling, myalgias, muscle weakness, morning stiffness, muscle tenderness and myalgias.  Skin:  Negative for color change, rash, hair loss and sensitivity to sunlight.  Allergic/Immunologic: Negative for susceptible to infections.  Neurological:  Negative for dizziness and headaches.  Hematological:  Negative for swollen glands.   Psychiatric/Behavioral:  Negative for depressed mood and sleep disturbance. The patient is not nervous/anxious.     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   History of CVA (cerebrovascular accident) 11/13/2011   Post-menopausal bleeding 11/13/2011   HIV INFECTION 09/08/2006   CANDIDIASIS, VAGINAL 09/08/2006   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   PERIPHERAL NEUROPATHY 09/08/2006   Essential hypertension 09/08/2006   REFLUX ESOPHAGITIS 09/08/2006   OVARIAN CYST 09/08/2006   DYSPLASIA, CERVIX NOS 09/08/2006   FOLLICULITIS 09/08/2006   Enlarged lymph nodes 09/08/2006   PAP SMEAR, ABNORMAL 10/09/2000    Past Medical History:  Diagnosis Date   Anxiety    Arthritis    Cancer (HCC)    CVA (cerebral infarction)    Eye swelling 07/31/2021   GERD (gastroesophageal reflux disease)    Gout    HIV infection (HCC)    HTN (hypertension)    Hyperlipidemia    Morbid obesity with BMI of 40.0-44.9, adult (HCC)    Postmenopausal bleeding  Stroke [facility])    no residual paralysis    Family History  Problem Relation Age of Onset   Hypertension Mother    Hypertension Sister    Arthritis Sister    Cancer Other    Diabetes Other    Healthy Daughter    Past Surgical History:  Procedure Laterality Date   COLONOSCOPY  2000   HYSTEROSCOPY WITH D & Laura N/A 04/19/2013   Procedure: DILATATION AND CURETTAGE /HYSTEROSCOPY;  Surgeon: Allie Bossier, MD;  Location:  [facility];  Service: Gynecology;  Laterality: N/A;   LYMPH NODE BIOPSY Right 09/17/2020   Procedure: LYMPH NODE BIOPSY; INGUINAL;  Surgeon: Lucretia Roers, MD;  Location: [facility];  Service: General;  Laterality: Right;   Social History   Social History Narrative   Grew up in [place], [place].   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   2 children, boy and girl. Son is 40s, and special needs.   Daughter in her 37s, is a Engineer, civil (consulting).   Eats all food groups.   Wears seatbelt.   Attends church.   Enjoys watching tv, listening to [employer], and spending time with friends.   Enjoys going out to eat.    Immunization History  Administered Date(s) Administered   Influenza Split 06/08/2010, 05/26/2012, 05/31/2013, 05/29/2014, 05/23/2015   Influenza Whole 05/26/2005   Influenza, High Dose Seasonal PF 05/12/2018, 05/28/2020   Influenza, Quadrivalent, Recombinant, Inj, Pf 09/03/2016, 05/21/2017   Influenza,inj,Quad PF,6+ Mos 09/03/2016   Influenza-Unspecified 06/08/2010, 05/26/2012, 05/31/2013, 05/29/2014, 05/23/2015, 09/03/2016, 05/21/2017, 05/30/2020, 05/22/2021   Moderna Covid-19 Vaccine Bivalent Booster 83yrs & up 06/19/2021   PFIZER(Purple Top)SARS-COV-2 Vaccination 10/08/2019, 11/01/2019, 07/26/2020   PPD Test 06/08/2009   Pneumococcal Conjugate-13 03/20/2014   Pneumococcal Polysaccharide-23 05/31/2013, 04/27/2019   Pneumococcal-Unspecified 05/31/2013, 03/20/2014   Tdap 03/20/2014, 09/25/2017   Zoster, Live 11/21/2020     Objective: Vital Signs: BP (!) 182/90 (BP Location: Right Arm, Patient Position: Sitting, Cuff Size: Large)   Pulse 69   Resp 16   Ht 5\' 7"  (1.702 m)   Wt 255 lb (115.7 kg)   BMI 39.94 kg/m    Physical Exam Vitals and nursing note reviewed.  Constitutional:      Appearance: She is well-developed.  HENT:     Head: Normocephalic and atraumatic.  Eyes:     Conjunctiva/sclera: Conjunctivae normal.  Cardiovascular:     Rate and Rhythm: Normal rate and  regular rhythm.     Heart sounds: Normal heart sounds.  Pulmonary:     Effort: Pulmonary effort is normal.     Breath sounds: Normal breath sounds.  Abdominal:     General: Bowel sounds are normal.     Palpations: Abdomen is soft.  Musculoskeletal:     Cervical back: Normal range of motion.  Lymphadenopathy:     Cervical: No cervical adenopathy.  Skin:    General: Skin is warm and dry.     Capillary Refill: Capillary refill takes less than 2 seconds.     Comments: Hyperpigmented macular and papular lesions noted on the abdomen.  Neurological:     Mental Status: She is alert and oriented to person, place, and time.  Psychiatric:        Behavior: Behavior normal.      Musculoskeletal Exam: Cervical spine was in good range of motion.  She had discomfort range of motion of the lumbar spine.  Shoulder left shoulder joint had limited abduction and forward flexion.  Right shoulder joint  was in good range of motion.  Elbow joints wrist joints, MCPs PIPs and DIPs were in good range of motion with no synovitis.  Hip joints and knee joints in good range of motion.  She had no tenderness over ankles or MTPs.  CDAI Exam: CDAI Score: -- Patient Global: --; Provider Global: -- Swollen: --; Tender: -- Joint Exam 01/06/2023   No joint exam has been documented for this visit   There is currently no information documented on the homunculus. Go to the Rheumatology activity and complete the homunculus joint exam.  Investigation: No additional findings.  Imaging: No results found.  Recent Labs: Lab Results  Component Value Date   WBC 7.7 12/09/2022   HGB 11.1 (L) 12/09/2022   PLT 69 (L) 12/09/2022   NA 137 12/09/2022   K 3.7 12/09/2022   CL 104 12/09/2022   CO2 27 12/09/2022   GLUCOSE 93 12/09/2022   BUN 18 12/09/2022   CREATININE 1.17 (H) 12/09/2022   BILITOT 0.5 12/09/2022   ALKPHOS 74 09/11/2021   AST 20 12/09/2022   ALT 12 12/09/2022   PROT 8.7 (H) 12/09/2022   ALBUMIN 4.1  09/11/2021   CALCIUM 9.4 12/09/2022   GFRAA 75 01/04/2021   QFTBGOLDPLUS NEGATIVE 06/04/2020   December 09, 2022 beta-2 GP 1 negative, anticardiolipin negative, C3-C4 normal, ANA negative, ENA (RNP, Smith, SSA, SSB, dsDNA, SCL 70) negative, RF negative, anti-CCP negative, ESR 67, uric acid 8.6    Speciality Comments: No specialty comments available.  Procedures:  No procedures performed Allergies: Gadolinium derivatives, Amlodipine, Ivp dye [iodinated contrast media], Penicillins, Tape, Cephalexin, and Latex   Assessment / Plan:     Visit Diagnoses: Idiopathic chronic gout of multiple sites without tophus - Symptoms started September 2023.  Patient declined allopurinol at the last visit.  She takes prednisone as needed.  Patient states that she had recent flare in her foot for which she took prednisone.  She was advised to take colchicine 0.3 mg every day but she has not been taking.  She takes colchicine only on.  Basis per patient.  She continues to consume red meat and seafood.  I also had a detailed discussion with the patient regarding hyperuricemia and use of Uloric.  Patient declined Uloric.  Patient will follow-up with her PCP and get prednisone on as needed basis.  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  Pain in right foot - Patient declined x-rays at the last visit.  RF negative, anti-CCP negative, sed rate elevated, uric acid-elevated.  Patient states she had a recent flare.  Chronic left shoulder pain - Patient had limited range of motion.  She is followed by Dr. Debby Bud who advised surgery per patient.  DDD (degenerative disc disease), lumbar - MRI 2022  Rash - Rash on abdomen, diagnosed by dermatology per patient.  Patient states that she had biopsy at [facility] and did not go back for the follow-up visit.  Patient refused to go back to the same dermatologist.  We will request the biopsy.  I received the skin biopsy report he eventually towards  the end of the day the path report final microscopic diagnosis was hypertrophic scar.  Patient will need another biopsy from a new lesion.  Will refer her to another dermatology practice per patient's request.    Age-related osteoporosis without current pathological fracture - March 06, 2021 DEXA scan :[facility] Spine L1-L2 03/06/2021 68.1 Osteopenia -2.1 0.907 g/cm2 -11.9%.  Vitamin D deficiency - Vitamin  D 22.9 on February 14, 2022.  Other medical problems listed as follows:  History of CVA (cerebrovascular accident)  Hyperlipidemia, mixed  Essential hypertension-blood pressure was rated at 182/90 and today.  Repeat blood pressure was 183/91.  Patient was advised to contact her PCP and monitor blood pressure closely.  Patient had previous stroke.  MGUS (monoclonal gammopathy of unknown significance) - Followed by Dr. Leonides Schanz.  Vulvar cancer (HCC)  Lymphadenopathy  HIV INFECTION - Followed by Dr. Daiva Eves  Acquired hypothyroidism  History of esophageal reflux  Anxiety and depression  Morbid obesity (HCC)  Orders: Orders Placed This Encounter  Procedures   Ambulatory referral to Dermatology   No orders of the defined types were placed in this encounter.    Follow-Up Instructions: Return in 4 months (on 05/09/2023) for Gout, CLE?Marland Kitchen   Pollyann Savoy, MD  Note - This record has been created using Animal nutritionist.  Chart creation errors have been sought, but may not always  have been located. Such creation errors do not reflect on  the standard of medical care.

## 2022-12-24 ENCOUNTER — Other Ambulatory Visit: Payer: Self-pay | Admitting: Infectious Disease

## 2022-12-24 ENCOUNTER — Other Ambulatory Visit (HOSPITAL_COMMUNITY): Payer: Self-pay

## 2022-12-24 ENCOUNTER — Other Ambulatory Visit: Payer: Self-pay

## 2022-12-24 MED ORDER — BIKTARVY 50-200-25 MG PO TABS
1.0000 | ORAL_TABLET | Freq: Every day | ORAL | 2 refills | Status: DC
  Filled 2022-12-24: qty 30, 30d supply, fill #0
  Filled 2023-01-16: qty 30, 30d supply, fill #1

## 2022-12-25 LAB — HIV RNA, RTPCR W/R GT (RTI, PI,INT)
HIV 1 RNA Quant: 113 copies/mL — ABNORMAL HIGH
HIV-1 RNA Quant, Log: 2.05 Log copies/mL — ABNORMAL HIGH

## 2023-01-06 ENCOUNTER — Ambulatory Visit: Payer: 59 | Attending: Rheumatology | Admitting: Rheumatology

## 2023-01-06 ENCOUNTER — Encounter: Payer: Self-pay | Admitting: Rheumatology

## 2023-01-06 VITALS — BP 182/90 | HR 69 | Resp 16 | Ht 67.0 in | Wt 255.0 lb

## 2023-01-06 DIAGNOSIS — M810 Age-related osteoporosis without current pathological fracture: Secondary | ICD-10-CM

## 2023-01-06 DIAGNOSIS — E782 Mixed hyperlipidemia: Secondary | ICD-10-CM | POA: Diagnosis not present

## 2023-01-06 DIAGNOSIS — L932 Other local lupus erythematosus: Secondary | ICD-10-CM

## 2023-01-06 DIAGNOSIS — R591 Generalized enlarged lymph nodes: Secondary | ICD-10-CM

## 2023-01-06 DIAGNOSIS — M25512 Pain in left shoulder: Secondary | ICD-10-CM

## 2023-01-06 DIAGNOSIS — M5136 Other intervertebral disc degeneration, lumbar region: Secondary | ICD-10-CM | POA: Diagnosis not present

## 2023-01-06 DIAGNOSIS — G8929 Other chronic pain: Secondary | ICD-10-CM

## 2023-01-06 DIAGNOSIS — M79671 Pain in right foot: Secondary | ICD-10-CM | POA: Diagnosis not present

## 2023-01-06 DIAGNOSIS — D472 Monoclonal gammopathy: Secondary | ICD-10-CM | POA: Diagnosis not present

## 2023-01-06 DIAGNOSIS — M1A09X0 Idiopathic chronic gout, multiple sites, without tophus (tophi): Secondary | ICD-10-CM

## 2023-01-06 DIAGNOSIS — M51369 Other intervertebral disc degeneration, lumbar region without mention of lumbar back pain or lower extremity pain: Secondary | ICD-10-CM

## 2023-01-06 DIAGNOSIS — Z8673 Personal history of transient ischemic attack (TIA), and cerebral infarction without residual deficits: Secondary | ICD-10-CM | POA: Diagnosis not present

## 2023-01-06 DIAGNOSIS — B20 Human immunodeficiency virus [HIV] disease: Secondary | ICD-10-CM

## 2023-01-06 DIAGNOSIS — I10 Essential (primary) hypertension: Secondary | ICD-10-CM

## 2023-01-06 DIAGNOSIS — E039 Hypothyroidism, unspecified: Secondary | ICD-10-CM

## 2023-01-06 DIAGNOSIS — F32A Depression, unspecified: Secondary | ICD-10-CM

## 2023-01-06 DIAGNOSIS — E559 Vitamin D deficiency, unspecified: Secondary | ICD-10-CM | POA: Diagnosis not present

## 2023-01-06 DIAGNOSIS — Z8719 Personal history of other diseases of the digestive system: Secondary | ICD-10-CM

## 2023-01-06 DIAGNOSIS — C519 Malignant neoplasm of vulva, unspecified: Secondary | ICD-10-CM

## 2023-01-06 DIAGNOSIS — F419 Anxiety disorder, unspecified: Secondary | ICD-10-CM

## 2023-01-06 DIAGNOSIS — R21 Rash and other nonspecific skin eruption: Secondary | ICD-10-CM | POA: Diagnosis not present

## 2023-01-06 DIAGNOSIS — E790 Hyperuricemia without signs of inflammatory arthritis and tophaceous disease: Secondary | ICD-10-CM

## 2023-01-09 ENCOUNTER — Other Ambulatory Visit (HOSPITAL_COMMUNITY): Payer: Self-pay

## 2023-01-13 DIAGNOSIS — G510 Bell's palsy: Secondary | ICD-10-CM | POA: Diagnosis not present

## 2023-01-13 DIAGNOSIS — H25813 Combined forms of age-related cataract, bilateral: Secondary | ICD-10-CM | POA: Diagnosis not present

## 2023-01-14 ENCOUNTER — Other Ambulatory Visit: Payer: Self-pay

## 2023-01-14 ENCOUNTER — Other Ambulatory Visit (HOSPITAL_COMMUNITY): Payer: Self-pay

## 2023-01-16 ENCOUNTER — Other Ambulatory Visit (HOSPITAL_COMMUNITY): Payer: Self-pay

## 2023-01-21 ENCOUNTER — Ambulatory Visit: Payer: 59 | Admitting: Infectious Disease

## 2023-01-21 ENCOUNTER — Other Ambulatory Visit (HOSPITAL_COMMUNITY): Payer: Self-pay

## 2023-01-22 ENCOUNTER — Other Ambulatory Visit (HOSPITAL_COMMUNITY): Payer: Self-pay

## 2023-01-22 ENCOUNTER — Ambulatory Visit
Admission: EM | Admit: 2023-01-22 | Discharge: 2023-01-22 | Disposition: A | Discharge status: Home / Self Care | Payer: 59 | Attending: Nurse Practitioner | Admitting: Nurse Practitioner

## 2023-01-22 ENCOUNTER — Ambulatory Visit (HOSPITAL_COMMUNITY)
Admission: RE | Admit: 2023-01-22 | Discharge: 2023-01-22 | Disposition: A | Discharge status: Home / Self Care | Payer: 59 | Source: Ambulatory Visit | Attending: Nurse Practitioner | Admitting: Nurse Practitioner

## 2023-01-22 DIAGNOSIS — R509 Fever, unspecified: Secondary | ICD-10-CM | POA: Insufficient documentation

## 2023-01-22 DIAGNOSIS — Z21 Asymptomatic human immunodeficiency virus [HIV] infection status: Secondary | ICD-10-CM | POA: Insufficient documentation

## 2023-01-22 DIAGNOSIS — B20 Human immunodeficiency virus [HIV] disease: Secondary | ICD-10-CM | POA: Diagnosis present

## 2023-01-22 DIAGNOSIS — Z1152 Encounter for screening for COVID-19: Secondary | ICD-10-CM | POA: Insufficient documentation

## 2023-01-22 DIAGNOSIS — R059 Cough, unspecified: Secondary | ICD-10-CM | POA: Insufficient documentation

## 2023-01-22 MED ORDER — FLUTICASONE PROPIONATE 50 MCG/ACT NA SUSP: 2.0000 | Freq: Every day | NASAL | 0 refills | Status: DC

## 2023-01-22 MED ORDER — PSEUDOEPH-BROMPHEN-DM 30-2-10 MG/5ML PO SYRP: 5.0000 mL | ORAL_SOLUTION | Freq: Four times a day (QID) | ORAL | 0 refills | Status: DC | PRN

## 2023-01-22 NOTE — ED Provider Notes (Signed)
[facility]    CSN: [id number] Arrival date & time: 01/22/23  1108      History   Chief Complaint No chief complaint on file.   HPI Laura Jimenez is a 72 y.o. female.   The history is provided by the patient.   The patient presents for complaints of fever, chills, chest congestion, confusion, vomiting, and unsteady gait.  Patient states symptoms started 2 days ago.  She states that she was "so out of it" but she cannot get to her thermometer to check her temperature.  She also states that she had difficult time standing.  She states that she did have vomiting for the first 2 days, but states that yesterday, she was able to keep Jell-O down.  She also states that she has not vomited today.  Patient also states that she does have a cough that is productive.  She denies headache, ear pain, wheezing, difficulty breathing, chest pain, abdominal pain, or diarrhea.  Patient reports that she has not taken any medication for her symptoms other than Tylenol.  She reports that she does have a history of HIV, she is currently on Biktarvy.  Past Medical History:  Diagnosis Date   Anxiety    Arthritis    Cancer (HCC)    CVA (cerebral infarction)    Eye swelling 07/31/2021   GERD (gastroesophageal reflux disease)    Gout    HIV infection (HCC)    HTN (hypertension)    Hyperlipidemia    Morbid obesity with BMI of 40.0-44.9, adult (HCC)    Postmenopausal bleeding    Stroke [facility])    no residual paralysis    Patient Active Problem List   Diagnosis Date Noted   Primary osteoarthritis, right shoulder 08/05/2022   Arthritis of left shoulder region 02/26/2022   Eye swelling 07/31/2021   HIV disease (HCC) 11/21/2020   Postoperative seroma of skin after non-dermatologic procedure 10/09/2020   Right leg swelling 10/09/2020   Acute pain of left shoulder 10/06/2018   Hypothyroidism 10/23/2017   Hyperlipidemia, mixed 08/11/2017   CVA (cerebral vascular accident) (HCC) 08/11/2017    Morbid obesity (HCC) 08/11/2017   Vulvar cancer (HCC) 11/13/2011   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   History of CVA (cerebrovascular accident) 11/13/2011   Post-menopausal bleeding 11/13/2011   HIV INFECTION 09/08/2006   CANDIDIASIS, VAGINAL 09/08/2006   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   PERIPHERAL NEUROPATHY 09/08/2006   Essential hypertension 09/08/2006   REFLUX ESOPHAGITIS 09/08/2006   OVARIAN CYST 09/08/2006   DYSPLASIA, CERVIX NOS 09/08/2006   FOLLICULITIS 09/08/2006   Enlarged lymph nodes 09/08/2006   PAP SMEAR, ABNORMAL 10/09/2000    Past Surgical History:  Procedure Laterality Date   COLONOSCOPY  2000   HYSTEROSCOPY WITH D & C N/A 04/19/2013   Procedure: DILATATION AND CURETTAGE /HYSTEROSCOPY;  Surgeon: Allie Bossier, MD;  Location: [facility];  Service: Gynecology;  Laterality: N/A;   LYMPH NODE BIOPSY Right 09/17/2020   Procedure: LYMPH NODE BIOPSY; INGUINAL;  Surgeon: Lucretia Roers, MD;  Location: [facility];  Service: General;  Laterality: Right;    OB History     Gravida  3   Para  2   Term  2   Preterm      AB  1   Living         SAB  1   IAB      Ectopic      Multiple      Live Births  Home Medications    Prior to Admission medications   Medication Sig Start Date End Date Taking? Authorizing Provider  brompheniramine-pseudoephedrine-DM 30-2-10 MG/5ML syrup Take 5 mLs by mouth 4 (four) times daily as needed. 01/22/23  Yes Nawaf Strange-Warren, Sadie Haber, NP  fluticasone (FLONASE) 50 MCG/ACT nasal spray Place 2 sprays into both nostrils daily. 01/22/23  Yes Lataya Varnell-Warren, Sadie Haber, NP  acetaminophen (TYLENOL) 325 MG tablet as needed.    [provider]  allopurinol (ZYLOPRIM) 100 MG tablet Prescription ran out Patient not taking: Reported on 12/09/2022 05/22/21   [provider]  ascorbic acid (VITAMIN C) 500 MG tablet Take 1 tablet by mouth daily.    [provider]  aspirin (PX ENTERIC ASPIRIN) 81 MG EC tablet  Take 1 tablet (81 mg total) by mouth daily. Swallow whole. 09/25/17   Aliene Beams, MD  Aspirin-Salicylamide-Caffeine (BC HEADACHE POWDER PO) Take by mouth.    [provider]  atorvastatin (LIPITOR) 10 MG tablet Take 1 tablet by mouth daily. 10/09/20   [provider]  bictegravir-emtricitabine-tenofovir AF (BIKTARVY) 50-200-25 MG TABS tablet Take 1 tablet by mouth daily. Patient needs to make her appt with Dr. Daiva Eves to get further prescriptions, this is the ONLY medicine she will be on for now 12/24/22   Daiva Eves, Lisette Grinder, MD  cabotegravir & rilpivirine ER (CABENUVA) 600 & 900 MG/3ML injection Inject 1 kit into the muscle every 2 (two) months. Patient not taking: Reported on 12/09/2022 12/08/22   Kuppelweiser, Cassie L, RPH-CPP  cabotegravir & rilpivirine ER (CABENUVA) 600 & 900 MG/3ML injection Inject 1 kit into the muscle every 30 (thirty) days. Patient not taking: Reported on 12/09/2022 12/08/22   Kuppelweiser, Cassie L, RPH-CPP  colchicine 0.6 MG tablet Take 0.5 tablets (0.3 mg total) by mouth daily. 12/09/22   Pollyann Savoy, MD  doxycycline (VIBRAMYCIN) 100 MG capsule Take 1 capsule (100 mg total) by mouth 2 (two) times daily. Patient not taking: Reported on 12/09/2022 05/18/22   Raspet, Noberto Retort, PA-C  lenacapavir (SUNLENCA) 300 mg tablet Take 2 tablets by mouth See admin instructions. This is the 2-day initiation kit: Take two 300-mg tablets on Day 1 & Day 2 with or without food. Patient not taking: Reported on 12/09/2022 12/08/22   Kuppelweiser, Cassie L, RPH-CPP  levothyroxine (SYNTHROID) 25 MCG tablet Take 25 mcg by mouth every morning. Patient not taking: Reported on 01/06/2023 02/12/21   [provider]  metoprolol succinate (TOPROL-XL) 25 MG 24 hr tablet Take 25 mg by mouth at bedtime. Patient not taking: Reported on 12/09/2022 10/24/20   [provider]  olmesartan (BENICAR) 20 MG tablet Take 40 mg by mouth daily.    [provider]  pantoprazole  (PROTONIX) 40 MG tablet Take 40 mg by mouth daily. Patient not taking: Reported on 12/09/2022 01/19/20   [provider]  SQ injection lenacapavir (SUNLENCA) 463.5 MG/1.5ML SQ injection Inject 3 mLs (927 mg total) into the skin every 6 (six) months. Administer each injection subcutaneously at separate sites in the abdomen (more or equal to 2 inches from the navel). Patient not taking: Reported on 12/09/2022 12/08/22   Kuppelweiser, Cassie L, RPH-CPP  tetrahydrozoline 0.05 % ophthalmic solution Place 1 drop into both eyes daily as needed (red eyes). Patient not taking: Reported on 12/22/2022    [provider]  triamterene-hydrochlorothiazide (MAXZIDE-25) 37.5-25 MG tablet Take 1 tablet by mouth daily.    [provider]  Vitamin D, Ergocalciferol, (DRISDOL) 1.25 MG (50000 UNIT) CAPS capsule  Take 50,000 Units by mouth every 7 (seven) days.    [provider]    Family History Family History  Problem Relation Age of Onset   Hypertension Mother    Hypertension Sister    Arthritis Sister    Cancer Other    Diabetes Other    Healthy Daughter     Social History Social History   Tobacco Use   Smoking status: Former    Packs/day: 1.00    Years: 5.00    Additional pack years: 0.00    Total pack years: 5.00    Types: Cigarettes    Passive exposure: Never   Smokeless tobacco: Never  Vaping Use   Vaping Use: Never used  Substance Use Topics   Alcohol use: No   Drug use: No     Allergies   Gadolinium derivatives, Amlodipine, Ivp dye [iodinated contrast media], Penicillins, Tape, Cephalexin, and Latex   Review of Systems Review of Systems Per HPI  Physical Exam Triage Vital Signs ED Triage Vitals  Enc Vitals Group     BP 01/22/23 1112 138/81     Pulse Rate 01/22/23 1112 (!) 103     Resp 01/22/23 1112 (!) 22     Temp 01/22/23 1112 98.6 F (37 C)     Temp src --      SpO2 01/22/23 1112 95 %     Weight --      Height --      Head  Circumference --      Peak Flow --      Pain Score 01/22/23 1115 0     Pain Loc --      Pain Edu? --      Excl. in GC? --    No data found.  Updated Vital Signs BP 138/81   Pulse (!) 103   Temp 98.6 F (37 C)   Resp (!) 22   SpO2 95%   Visual Acuity Right Eye Distance:   Left Eye Distance:   Bilateral Distance:    Right Eye Near:   Left Eye Near:    Bilateral Near:     Physical Exam Vitals and nursing note reviewed.  Constitutional:      General: She is not in acute distress.    Appearance: Normal appearance.  HENT:     Head: Normocephalic.     Right Ear: Tympanic membrane, ear canal and external ear normal.     Left Ear: Tympanic membrane, ear canal and external ear normal.  Eyes:     Extraocular Movements: Extraocular movements intact.     Pupils: Pupils are equal, round, and reactive to light.  Cardiovascular:     Rate and Rhythm: Regular rhythm. Tachycardia present.     Pulses: Normal pulses.     Heart sounds: Normal heart sounds.  Pulmonary:     Effort: Pulmonary effort is normal.     Breath sounds: Normal breath sounds.  Abdominal:     General: Bowel sounds are normal.     Palpations: Abdomen is soft.     Tenderness: There is no abdominal tenderness.  Musculoskeletal:     Cervical back: Normal range of motion.  Lymphadenopathy:     Cervical: No cervical adenopathy.  Skin:    General: Skin is warm and dry.  Neurological:     General: No focal deficit present.     Mental Status: She is alert and oriented to person, place, and time.  Psychiatric:  Mood and Affect: Mood normal.        Behavior: Behavior normal.      UC Treatments / Results  Labs (all labs ordered are listed, but only abnormal results are displayed) Labs Reviewed  SARS CORONAVIRUS 2 (TAT 6-24 HRS)    EKG   Radiology DG Chest 2 View  Result Date: 01/22/2023 CLINICAL DATA:  Fever, cough. EXAM: CHEST - 2 VIEW COMPARISON:  Chest radiograph 05/14/2022 FINDINGS: The  cardiomediastinal silhouette is stable and within normal limits. There is no focal consolidation or pulmonary edema. There is no pleural effusion or pneumothorax There is no acute osseous abnormality. IMPRESSION: No radiographic evidence of acute cardiopulmonary process. Electronically Signed   By: Lesia Hausen M.D.   On: 01/22/2023 13:09    Procedures Procedures (including critical care time)  Medications Ordered in UC Medications - No data to display  Initial Impression / Assessment and Plan / UC Course  I have reviewed the triage vital signs and the nursing notes.  Pertinent labs & imaging results that were available during my care of the patient were reviewed by me and considered in my medical decision making (see chart for details).  Patient was sent to [facility] for an outpatient chest x-ray.  Would like to rule out pneumonia given her underlying history of HIV.  Patient is in agreement with this plan.  Patient will be contacted when the results of the chest x-ray are received.  COVID test is pending.  Patient is able to receive molnupiravir if her COVID test is positive.  Patient was given supportive care recommendations to include use of a humidifier in her bedroom at nighttime during sleep, being elevated on pillows, use of Pedialyte or Gatorolyte like to prevent dehydration, and normal saline nasal spray to help with nasal congestion and runny nose.  Patient was advised she will be contacted when the results of the chest x-ray are received, and at that time, we will send in appropriate treatment for her symptoms to her preferred pharmacy.  Patient was also advised that it is recommended that she follow-up with her primary care physician within the next 7 to 10 days for reevaluation.  Patient was given strict ER follow-up precautions to include reoccurring episode of confusion or change in her mental status, shortness of breath, difficulty breathing, fever greater than 102, or other  concerns.  Patient is in agreement with this plan of care and verbalizes understanding.  All questions were answered.  Patient stable for discharge.  Received patient's chest x-ray, which was negative for pneumonia or other active cardiopulmonary disease.  Called patient to inform of results of her chest x-ray.  Spoke with patient, using 2 patient identifiers, advised of the negative chest x-ray result.  Patient was informed a prescription for Bromfed-DM was provided for her cough, and fluticasone 50 mcg nasal spray for nasal congestion and runny nose.  Patient was advised to follow the recommendations that were provided on her discharge paperwork, along with following the indications of when she would need to go to the emergency department.  Patient is in agreement with this plan and verbalized understanding.  All questions were answered.  Final Clinical Impressions(s) / UC Diagnoses   Final diagnoses:  Fever, unspecified  Cough, unspecified type  History of HIV infection (HCC)  Encounter for screening for COVID-19     Discharge Instructions      I would like for you to go to [facility] to have a chest x-ray performed.  Once results of the x-ray are received, I will contact you to discuss treatment. COVID test is pending.  You will be contacted if your pending test result is positive. Increase your fluid intake.  I do recommend use of Pedialyte or Gatorolyte while symptoms persist to prevent dehydration. May take over-the-counter Tylenol as needed for pain, fever, general discomfort. Recommend normal saline nasal spray to help with nasal congestion and runny nose. Warm salt water gargles 3-4 times daily while symptoms persist. Recommend using a humidifier in your bedroom at nighttime during sleep and sleeping elevated on pillows persist. Recommend an appointment with your primary care physician within the next 7 to 10 days for reevaluation. Go to the emergency department  immediately if you experience confusion or change in your mental status, shortness of breath, difficulty breathing, fever greater than 102, or other concerns. Follow-up as needed.     ED Prescriptions     Medication Sig Dispense Auth. Provider   brompheniramine-pseudoephedrine-DM 30-2-10 MG/5ML syrup Take 5 mLs by mouth 4 (four) times daily as needed. 140 mL Ayelen Sciortino-Warren, Sadie Haber, NP   fluticasone (FLONASE) 50 MCG/ACT nasal spray Place 2 sprays into both nostrils daily. 16 g Jaedynn Bohlken-Warren, Sadie Haber, NP      PDMP not reviewed this encounter.   Abran Cantor, NP 01/22/23 1332

## 2023-01-22 NOTE — Discharge Instructions (Addendum)
I would like for you to go to [facility] to have a chest x-ray performed.  Once results of the x-ray are received, I will contact you to discuss treatment. COVID test is pending.  You will be contacted if your pending test result is positive. Increase your fluid intake.  I do recommend use of Pedialyte or Gatorolyte while symptoms persist to prevent dehydration. May take over-the-counter Tylenol as needed for pain, fever, general discomfort. Recommend normal saline nasal spray to help with nasal congestion and runny nose. Warm salt water gargles 3-4 times daily while symptoms persist. Recommend using a humidifier in your bedroom at nighttime during sleep and sleeping elevated on pillows persist. Recommend an appointment with your primary care physician within the next 7 to 10 days for reevaluation. Go to the emergency department immediately if you experience confusion or change in your mental status, shortness of breath, difficulty breathing, fever greater than 102, or other concerns. Follow-up as needed.

## 2023-01-22 NOTE — ED Triage Notes (Signed)
Pt reports since Tuesday she could not keep any food down, had cold chills, fever, confusion, unsteady balance. She reports yesterday she felt a little better. She had coughing and vomiting up a lot of mucus x 1 day.

## 2023-01-23 LAB — SARS CORONAVIRUS 2 (TAT 6-24 HRS): SARS Coronavirus 2: NEGATIVE

## 2023-01-30 DIAGNOSIS — J069 Acute upper respiratory infection, unspecified: Secondary | ICD-10-CM | POA: Diagnosis not present

## 2023-02-05 ENCOUNTER — Other Ambulatory Visit (HOSPITAL_COMMUNITY): Payer: Self-pay

## 2023-02-06 ENCOUNTER — Other Ambulatory Visit: Payer: Self-pay

## 2023-02-10 ENCOUNTER — Ambulatory Visit (INDEPENDENT_AMBULATORY_CARE_PROVIDER_SITE_OTHER): Payer: 59 | Admitting: Infectious Disease

## 2023-02-10 ENCOUNTER — Other Ambulatory Visit: Payer: Self-pay

## 2023-02-10 ENCOUNTER — Other Ambulatory Visit (HOSPITAL_COMMUNITY)
Admission: RE | Admit: 2023-02-10 | Discharge: 2023-02-10 | Disposition: A | Discharge status: Home / Self Care | Payer: 59 | Source: Ambulatory Visit | Attending: Infectious Disease | Admitting: Infectious Disease

## 2023-02-10 ENCOUNTER — Other Ambulatory Visit (HOSPITAL_COMMUNITY): Payer: Self-pay

## 2023-02-10 ENCOUNTER — Telehealth: Payer: Self-pay | Admitting: Pharmacist

## 2023-02-10 ENCOUNTER — Encounter: Payer: Self-pay | Admitting: Infectious Disease

## 2023-02-10 VITALS — BP 169/81 | HR 67 | Temp 98.1°F | Wt 254.0 lb

## 2023-02-10 DIAGNOSIS — I10 Essential (primary) hypertension: Secondary | ICD-10-CM | POA: Insufficient documentation

## 2023-02-10 DIAGNOSIS — Z91199 Patient's noncompliance with other medical treatment and regimen due to unspecified reason: Secondary | ICD-10-CM

## 2023-02-10 DIAGNOSIS — B20 Human immunodeficiency virus [HIV] disease: Secondary | ICD-10-CM

## 2023-02-10 HISTORY — DX: Essential (primary) hypertension: I10

## 2023-02-10 MED ORDER — BICTEGRAVIR-EMTRICITAB-TENOFOV 50-200-25 MG PO TABS
1.0000 | ORAL_TABLET | Freq: Every day | ORAL | 1 refills | Status: DC
Start: 2023-02-10 — End: 2023-04-10
  Filled 2023-02-10 – 2023-02-18 (×2): qty 30, 30d supply, fill #0
  Filled 2023-03-16: qty 30, 30d supply, fill #1

## 2023-02-10 MED ORDER — RITONAVIR 100 MG PO TABS
100.0000 mg | ORAL_TABLET | Freq: Every day | ORAL | 4 refills | Status: DC
Start: 2023-02-10 — End: 2023-02-10

## 2023-02-10 MED ORDER — TIVICAY 50 MG PO TABS
50.0000 mg | ORAL_TABLET | Freq: Every day | ORAL | 4 refills | Status: DC
Start: 2023-02-10 — End: 2023-02-10

## 2023-02-10 MED ORDER — DARUNAVIR 600 MG PO TABS: 600.0000 mg | ORAL_TABLET | Freq: Two times a day (BID) | ORAL | 4 refills | Status: DC

## 2023-02-10 NOTE — Telephone Encounter (Signed)
Met with patient during her visit with Dr. Daiva Eves. She stated she experienced a stomach bug and viral-like respiratory illness at the end of May and assumed it was due to the [place]. She did admit she knew it could be other factors as well. Her daughter called during our discussion, and she helped provide further clarity to the situation. Stated her mom had not taken Biktarvy in over 2-3 weeks and that Kimia still had ongoing nausea and appetite changes, thus she could not blame these symptoms on Biktarvy. Adelyna agreed, and her daughter is working on getting her an appointment with [facility]. She agrees to restart [employer]. She was able to swallow these tablets whole without issues. Resending script to [facility] she will have it mailed. Will see her in month for adherence check-in.   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 The goal is to start [facility] next month when she follows up with me after she proves she can continue oral adherence. The main risk with starting Sunlenca is that she would not take oral therapy and only receive Sunlenca which is an incomplete regimen and could lead to [facility] resistance. This would eliminate an excellent salvage therapy for her HIV care.  She acknowledges that taking HIV medication is important and that she wants to take care of herself. She also states she is grieving as she recently lost two family members back-to-back. Discussed that we are here to help her and provide her the highest level of care so that this can be lower on her list of concerns in life. She states is more likely to die by a car accident than by stopping her HIV medications. Emphasized that HIV can rapidly progress off of medication and can cause detrimental effects on her health as her risk for opportunistic infections increases.   Margarite Gouge, PharmD, CPP, BCIDP, AAHIVP Clinical Pharmacist Practitioner Infectious Diseases Clinical  Pharmacist [facility]

## 2023-02-10 NOTE — Progress Notes (Signed)
Subjective:  Chief complaint: multiple concerns re Biktarvy having caused a myriad of symptoms including nausea, vomiting, and itching   Patient ID: Laura Jimenez, female    DOB: 08/23/1951, 72 y.o.   MRN: [id number]  HPI   Laura Jimenez is a 72 year-old African-American lady diagnosed with HIV in the 1990s who has a history of multidrug-resistant HIV and intermittent adherence to medications.   While she was at [facility] she was on a regimen of twice daily boosted darunavir (prezista) with norvir along with emtricitabine and isentress BID and with VL <20 fairly consistently in 2016 period, then changed from Isentress to [facility] for simplification of her regimen with reduction of pill burden and also giving her an integrase strand transfer inhibitor with higher barrier to resistance.   The patient has told us that she done developed a rash while on TIVICAY.  Certainly I cannot find documentation of her having developed a rash when she was switched to [facility] and she remained undetectable through several visits on this regimen of TIVICAY once daily Emtricitabine  and boosted darunavir ritonavir twice daily.   She  subsequently [facility] and she is largely been viremic although she did get a viral load down to 653 and November 29 of 2021.   There was a very nice note written at [facility] which Dr. Renold Don had put into the chart which summarize all of the resistance mutations over time that the patient had documented since 2007.   I put those mutations back into [employer] and the results are below.        Drug resistance interpretation: PR   HIVDB 9.0 (2019-10-17) PI Major Resistance Mutations:   M46I, I54V, I84V, L90IMP   PI Accessory Resistance Mutations : G73S Other Mutations: L10I, L63P, A71V, V77I   Protease Inhibitors atazanavir/r (ATV/r)    High-Level Resistance darunavir/r (DRV/r)      Low-Level Resistance lopinavir/r (LPV/r)        High-Level Resistance e daily.    Drug resistance interpretation: RT HIVDB 9.0 (2019-10-17)   NRTI Resistance Mutations:   M41L, M184V, L210W, T215Y NNRTI Resistance Mutations: K103N, Y181C, G190A   Other Mutations: None     Nucleoside Reverse Transcriptase Inhibitors abacavir (ABC)           High-Level Resistance zidovudine (AZT)        High-Level Resistance emtricitabine (FTC)     High-Level Resistance lamivudine (3TC)        High-Level Resistance tenofovir (TDF)            Intermediate Resistance Non-nucleoside Reverse Transcriptase Inhibitors doravirine (DOR)         Intermediate Resistance efavirenz (EFV)           High-Level Resistance etravirine (ETR)          Intermediate Resistance nevirapine (NVP)        High-Level Resistance rilpivirine (RPV)           High-Level Resistance   Laura Jimenez continued to not take the DESCOVY which she blames for some side effects that are clearly not related to this drug.  She did continue to take Prezista 600 mg with crushed 100 mg  tablets twice daily along with TIVICAY once daily.   Several visits ago she  stopped filling her antiretrovirals altogether.  She told me she could only take one pill once daily  Unfortunately only pill that we could do 1  pill once a day would be BIKTARVY and this would only have 1 fully active drug.  That being said I am willing to try this with very close monitoring.  She was  also interested in long-acting treatment though with long-acting we would have to go down the route of Cabotegravir + Lenacapravir and she wo is since here in the clinic ready to give to her.  However when she was presented with the size of the needle for injection she did not want to have them done    Since then she had what sounds like a GI illness.  She attributed this to [facility] as well as some low-grade itching that she was experiencing she says even at the last visit with me  she therefore stopped the BIKTARVY altogether.  Initially I was going to try to put her on darunavir ritonavir twice daily along with TIVICAY once daily.  However after Marchelle Folks  met with the patient and patient's daughter called Marchelle Folks and the daughter where able to convince Riely that her GI symptoms and itching were NOT from [facility].  The patient was then willing to restart Biktarvy while we endeavor to obtain [facility] and begin this.        Past Medical History:  Diagnosis Date   Anxiety    Arthritis    Cancer (HCC)    CVA (cerebral infarction)    Eye swelling 07/31/2021   GERD (gastroesophageal reflux disease)    Gout    HIV infection (HCC)    HTN (hypertension)    Hyperlipidemia    Morbid obesity with BMI of 40.0-44.9, adult (HCC)    Postmenopausal bleeding    Stroke (HCC)    no residual paralysis    Past Surgical History:  Procedure Laterality Date   COLONOSCOPY  2000   HYSTEROSCOPY WITH D & C N/A 04/19/2013   Procedure: DILATATION AND CURETTAGE /HYSTEROSCOPY;  Surgeon: Allie Bossier, MD;  Location: [facility];  Service: Gynecology;  Laterality: N/A;   LYMPH NODE BIOPSY Right 09/17/2020   Procedure: LYMPH NODE BIOPSY; INGUINAL;  Surgeon: Lucretia Roers, MD;  Location: [facility];  Service: General;  Laterality: Right;    Family History  Problem Relation Age of Onset   Hypertension Mother    Hypertension Sister    Arthritis Sister    Cancer Other    Diabetes Other    Healthy Daughter       Social History   Socioeconomic History   Marital status: Widowed    Spouse name: Not on file   Number of children: Not on file   Years of education: Not on file   Highest education level: 10th grade  Occupational History   Not on file  Tobacco Use   Smoking status: Former    Packs/day: 1.00    Years: 5.00    Additional pack years: 0.00    Total pack years: 5.00    Types: Cigarettes    Passive exposure: Never   Smokeless tobacco: Never  Vaping Use   Vaping Use:  Never used  Substance and Sexual Activity   Alcohol use: No   Drug use: No   Sexual activity: Not Currently    Birth control/protection: None, Post-menopausal  Other Topics Concern   Not on file  Social History Narrative   Grew up in [place], [place].   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   2 children, boy and girl. Son is 40s, and special needs.   Daughter  in her 30s, is a Engineer, civil (consulting).   Eats all food groups.   Wears seatbelt.   Attends church.   Enjoys watching tv, listening to [employer], and spending time with friends.   Enjoys going out to eat.    Social Determinants of Health   Financial Resource Strain: Low Risk  (06/27/2020)   Overall Financial Resource Strain (CARDIA)    Difficulty of Paying Living Expenses: Not hard at all  Food Insecurity: No Food Insecurity (06/27/2020)   Hunger Vital Sign    Worried About Running Out of Food in the Last Year: Never true    Ran Out of Food in the Last Year: Never true  Transportation Needs: No Transportation Needs (06/27/2020)   PRAPARE - Administrator, Civil Service (Medical): No    Lack of Transportation (Non-Medical): No  Physical Activity: Sufficiently Active (06/27/2020)   Exercise Vital Sign    Days of Exercise per Week: 7 days    Minutes of Exercise per Session: 60 min  Stress: Stress Concern Present (07/03/2020)   [employer] of Occupational Health - Occupational Stress Questionnaire    Feeling of Stress : To some extent  Social Connections: Moderately Isolated (06/27/2020)   Social Connection and Isolation Panel [NHANES]    Frequency of Communication with Friends and Family: More than three times a week    Frequency of Social Gatherings with Friends and Family: Once a week    Attends Religious Services: More than 4 times per year    Active Member of [employer] or Organizations: No    Attends Banker Meetings: Never    Marital Status: Widowed    Allergies  Allergen Reactions    Gadolinium Derivatives Nausea And Vomiting    Vomited with multihance 12/05/19 Vomited with multihance 12/05/19   Amlodipine Other (See Comments)   Ivp Dye [Iodinated Contrast Media] Other (See Comments)    Caused kidney damage   Penicillins Swelling   Tape Dermatitis   Cephalexin Rash   Latex Rash     Current Outpatient Medications:    acetaminophen (TYLENOL) 325 MG tablet, as needed., Disp: , Rfl:    ascorbic acid (VITAMIN C) 500 MG tablet, Take 1 tablet by mouth daily., Disp: , Rfl:    aspirin (PX ENTERIC ASPIRIN) 81 MG EC tablet, Take 1 tablet (81 mg total) by mouth daily. Swallow whole., Disp: 30 tablet, Rfl: 12   Aspirin-Salicylamide-Caffeine (BC HEADACHE POWDER PO), Take by mouth., Disp: , Rfl:    atorvastatin (LIPITOR) 10 MG tablet, Take 1 tablet by mouth daily., Disp: , Rfl:    brompheniramine-pseudoephedrine-DM 30-2-10 MG/5ML syrup, Take 5 mLs by mouth 4 (four) times daily as needed., Disp: 140 mL, Rfl: 0   colchicine 0.6 MG tablet, Take 0.5 tablets (0.3 mg total) by mouth daily., Disp: 45 tablet, Rfl: 0   fluticasone (FLONASE) 50 MCG/ACT nasal spray, Place 2 sprays into both nostrils daily., Disp: 16 g, Rfl: 0   olmesartan (BENICAR) 20 MG tablet, Take 40 mg by mouth daily., Disp: , Rfl:    triamterene-hydrochlorothiazide (MAXZIDE-25) 37.5-25 MG tablet, Take 1 tablet by mouth daily., Disp: , Rfl:    Vitamin D, Ergocalciferol, (DRISDOL) 1.25 MG (50000 UNIT) CAPS capsule, Take 50,000 Units by mouth every 7 (seven) days., Disp: , Rfl:    allopurinol (ZYLOPRIM) 100 MG tablet, Prescription ran out (Patient not taking: Reported on 12/09/2022), Disp: , Rfl:    azithromycin (ZITHROMAX) 250 MG tablet, Take by mouth. (Patient not taking: Reported on  02/10/2023), Disp: , Rfl:    bictegravir-emtricitabine-tenofovir AF (BIKTARVY) 50-200-25 MG TABS tablet, Take 1 tablet by mouth daily. Patient needs to make her appt with Dr. Daiva Eves to get further prescriptions, this is the ONLY medicine she will  be on for now (Patient not taking: Reported on 02/10/2023), Disp: 30 tablet, Rfl: 2   cabotegravir & rilpivirine ER (CABENUVA) 600 & 900 MG/3ML injection, Inject 1 kit into the muscle every 2 (two) months. (Patient not taking: Reported on 12/09/2022), Disp: 6 mL, Rfl: 5   cabotegravir & rilpivirine ER (CABENUVA) 600 & 900 MG/3ML injection, Inject 1 kit into the muscle every 30 (thirty) days. (Patient not taking: Reported on 12/09/2022), Disp: 6 mL, Rfl: 1   doxycycline (VIBRAMYCIN) 100 MG capsule, Take 1 capsule (100 mg total) by mouth 2 (two) times daily. (Patient not taking: Reported on 12/09/2022), Disp: 20 capsule, Rfl: 0   lenacapavir (SUNLENCA) 300 mg tablet, Take 2 tablets by mouth See admin instructions. This is the 2-day initiation kit: Take two 300-mg tablets on Day 1 & Day 2 with or without food. (Patient not taking: Reported on 12/09/2022), Disp: 4 each, Rfl: 0   levothyroxine (SYNTHROID) 25 MCG tablet, Take 25 mcg by mouth every morning. (Patient not taking: Reported on 01/06/2023), Disp: , Rfl:    metoprolol succinate (TOPROL-XL) 25 MG 24 hr tablet, Take 25 mg by mouth at bedtime. (Patient not taking: Reported on 12/09/2022), Disp: , Rfl:    pantoprazole (PROTONIX) 40 MG tablet, Take 40 mg by mouth daily. (Patient not taking: Reported on 12/09/2022), Disp: , Rfl:    SQ injection lenacapavir (SUNLENCA) 463.5 MG/1.5ML SQ injection, Inject 3 mLs (927 mg total) into the skin every 6 (six) months. Administer each injection subcutaneously at separate sites in the abdomen (more or equal to 2 inches from the navel). (Patient not taking: Reported on 12/09/2022), Disp: 3 mL, Rfl: 2   tetrahydrozoline 0.05 % ophthalmic solution, Place 1 drop into both eyes daily as needed (red eyes). (Patient not taking: Reported on 12/22/2022), Disp: , Rfl:     Review of Systems  Constitutional:  Positive for fever. Negative for activity change, appetite change, chills, diaphoresis, fatigue and unexpected weight change.   HENT:  Negative for congestion, rhinorrhea, sinus pressure, sneezing, sore throat and trouble swallowing.   Eyes:  Negative for photophobia and visual disturbance.  Respiratory:  Negative for cough, chest tightness, shortness of breath, wheezing and stridor.   Cardiovascular:  Negative for chest pain, palpitations and leg swelling.  Gastrointestinal:  Positive for abdominal distention, nausea and vomiting. Negative for abdominal pain, anal bleeding, blood in stool, constipation and diarrhea.  Genitourinary:  Negative for difficulty urinating, dysuria, flank pain and hematuria.  Musculoskeletal:  Negative for arthralgias, back pain, gait problem, joint swelling and myalgias.  Skin:  Negative for color change, pallor, rash and wound.  Neurological:  Negative for dizziness, tremors, weakness and light-headedness.  Hematological:  Negative for adenopathy. Does not bruise/bleed easily.  Psychiatric/Behavioral:  Negative for agitation, behavioral problems, confusion, decreased concentration, dysphoric mood and sleep disturbance.        Objective:   Physical Exam Constitutional:      General: She is not in acute distress.    Appearance: Normal appearance. She is well-developed. She is not ill-appearing or diaphoretic.  HENT:     Head: Normocephalic and atraumatic.     Right Ear: Hearing and external ear normal.     Left Ear: Hearing and external ear normal.  Nose: No nasal deformity or rhinorrhea.  Eyes:     General: No scleral icterus.    Conjunctiva/sclera: Conjunctivae normal.     Right eye: Right conjunctiva is not injected.     Left eye: Left conjunctiva is not injected.     Pupils: Pupils are equal, round, and reactive to light.  Neck:     Vascular: No JVD.  Cardiovascular:     Rate and Rhythm: Normal rate and regular rhythm.     Heart sounds: Normal heart sounds, S1 normal and S2 normal. No murmur heard.    No friction rub.  Abdominal:     General: Bowel sounds are normal.  There is no distension.     Palpations: Abdomen is soft.     Tenderness: There is no abdominal tenderness.  Musculoskeletal:        General: Normal range of motion.     Right shoulder: Normal.     Left shoulder: Normal.     Cervical back: Normal range of motion and neck supple.     Right hip: Normal.     Left hip: Normal.     Right knee: Normal.     Left knee: Normal.  Lymphadenopathy:     Head:     Right side of head: No submandibular, preauricular or posterior auricular adenopathy.     Left side of head: No submandibular, preauricular or posterior auricular adenopathy.     Cervical: No cervical adenopathy.     Right cervical: No superficial or deep cervical adenopathy.    Left cervical: No superficial or deep cervical adenopathy.  Skin:    General: Skin is warm and dry.     Coloration: Skin is not pale.     Findings: No abrasion, bruising, ecchymosis, erythema, lesion or rash.     Nails: There is no clubbing.  Neurological:     General: No focal deficit present.     Mental Status: She is alert and oriented to person, place, and time.     Sensory: No sensory deficit.     Coordination: Coordination normal.     Gait: Gait normal.  Psychiatric:        Attention and Perception: Attention normal. She is attentive.        Mood and Affect: Mood is depressed.        Speech: Speech normal.        Behavior: Behavior normal. Behavior is cooperative.        Thought Content: Thought content normal.        Cognition and Memory: Memory normal.        Judgment: Judgment normal.           A/P   HIV disease:  I will add order HIV viral load CD4 count CBC with differential CMP, RPR GC and chlamydia and we will restart Biktarvy and plan on adding SUNLENCA  Hypertension: poorly controlled but will defer to PCP  Nausea, vomiting, fevers: she was seen in ER as mentioned and symptoms have resolved.  Virtually as mentioned she had stopped her BIKTARVY because of all of this.  I have  personally spent 44 minutes involved in face-to-face and non-face-to-face activities for this patient on the day of the visit. Professional time spent includes the following activities: Preparing to see the patient (review of tests), Obtaining and/or reviewing separately obtained history (admission/discharge record), Performing a medically appropriate examination and/or evaluation , Ordering medications/tests/procedures, referring and communicating with other health care professionals, Documenting clinical information  in the EMR, Independently interpreting results (not separately reported), Communicating results to the patient/family/caregiver, Counseling and educating the patient/family/caregiver and Care coordination (not separately reported).

## 2023-02-11 LAB — T-HELPER CELLS (CD4) COUNT (NOT AT ARMC)
CD4 % Helper T Cell: 12 % — ABNORMAL LOW (ref 33–65)
CD4 T Cell Abs: 283 /uL — ABNORMAL LOW (ref 400–1790)

## 2023-02-11 LAB — COMPLETE METABOLIC PANEL WITH GFR
AST: 20 U/L (ref 10–35)
Albumin: 3.8 g/dL (ref 3.6–5.1)
Alkaline phosphatase (APISO): 69 U/L (ref 37–153)
CO2: 25 mmol/L (ref 20–32)
Calcium: 9.1 mg/dL (ref 8.6–10.4)
Chloride: 108 mmol/L (ref 98–110)
Glucose, Bld: 84 mg/dL (ref 65–99)
Potassium: 4.1 mmol/L (ref 3.5–5.3)

## 2023-02-11 LAB — CBC WITH DIFFERENTIAL/PLATELET
Absolute Monocytes: 484 cells/uL (ref 200–950)
Basophils Absolute: 40 cells/uL (ref 0–200)
Basophils Relative: 0.9 %
Lymphs Abs: 2702 cells/uL (ref 850–3900)
MCH: 29.7 pg (ref 27.0–33.0)
RBC: 3.97 10*6/uL (ref 3.80–5.10)
WBC: 4.4 10*3/uL (ref 3.8–10.8)

## 2023-02-12 LAB — URINE CYTOLOGY ANCILLARY ONLY
Chlamydia: NEGATIVE
Comment: NEGATIVE
Comment: NORMAL
Neisseria Gonorrhea: NEGATIVE

## 2023-02-15 LAB — HIV RNA, RTPCR W/R GT (RTI, PI,INT)
HIV 1 RNA Quant: 530 copies/mL — ABNORMAL HIGH
HIV-1 RNA Quant, Log: 2.72 Log copies/mL — ABNORMAL HIGH

## 2023-02-18 ENCOUNTER — Other Ambulatory Visit: Payer: Self-pay

## 2023-02-18 ENCOUNTER — Other Ambulatory Visit (HOSPITAL_COMMUNITY): Payer: Self-pay

## 2023-02-20 ENCOUNTER — Telehealth: Payer: Self-pay

## 2023-02-20 NOTE — Telephone Encounter (Signed)
Received call from [facility] wanting to confirm patient's ART regimen. Advised per Margarite Gouge, RPH she should be on Biktarvy only for now as we are working to get her on Sunlenca injections in addition to this.   Sandie Ano, RN

## 2023-02-21 LAB — CBC WITH DIFFERENTIAL/PLATELET
Eosinophils Absolute: 62 cells/uL (ref 15–500)
Eosinophils Relative: 1.4 %
HCT: 36.5 % (ref 35.0–45.0)
Hemoglobin: 11.8 g/dL (ref 11.7–15.5)
MCHC: 32.3 g/dL (ref 32.0–36.0)
MCV: 91.9 fL (ref 80.0–100.0)
MPV: 10.3 fL (ref 7.5–12.5)
Monocytes Relative: 11 %
Neutro Abs: 1113 cells/uL — ABNORMAL LOW (ref 1500–7800)
Neutrophils Relative %: 25.3 %
Platelets: 183 10*3/uL (ref 140–400)
RDW: 13.5 % (ref 11.0–15.0)
Total Lymphocyte: 61.4 %

## 2023-02-21 LAB — LIPID PANEL
Cholesterol: 220 mg/dL — ABNORMAL HIGH (ref <200)
HDL: 44 mg/dL — ABNORMAL LOW (ref >=50)
LDL Cholesterol (Calc): 144 mg/dL (calc) — ABNORMAL HIGH
Non-HDL Cholesterol (Calc): 176 mg/dL (calc) — ABNORMAL HIGH (ref <130)
Total CHOL/HDL Ratio: 5 (calc) — ABNORMAL HIGH (ref <5.0)
Triglycerides: 184 mg/dL — ABNORMAL HIGH (ref <150)

## 2023-02-21 LAB — RPR: RPR Ser Ql: NONREACTIVE

## 2023-02-21 LAB — HIV-1 INTEGRASE GENOTYPE

## 2023-02-21 LAB — COMPLETE METABOLIC PANEL WITH GFR
AG Ratio: 1 (calc) (ref 1.0–2.5)
ALT: 13 U/L (ref 6–29)
BUN: 11 mg/dL (ref 7–25)
Creat: 0.88 mg/dL (ref 0.60–1.00)
Globulin: 4 g/dL (calc) — ABNORMAL HIGH (ref 1.9–3.7)
Sodium: 140 mmol/L (ref 135–146)
Total Bilirubin: 0.5 mg/dL (ref 0.2–1.2)
Total Protein: 7.8 g/dL (ref 6.1–8.1)
eGFR: 71 mL/min/{1.73_m2} (ref >=60)

## 2023-02-21 LAB — HIV-1 GENOTYPE: HIV-1 Genotype: DETECTED — AB

## 2023-02-27 DIAGNOSIS — H2512 Age-related nuclear cataract, left eye: Secondary | ICD-10-CM | POA: Diagnosis not present

## 2023-03-02 NOTE — H&P (Signed)
Surgical History & Physical  Patient Name: Laura Jimenez  DOB: [birth year]  Surgery: Cataract extraction with intraocular lens implant phacoemulsification; Left Eye Surgeon: Pecolia Ades MD Surgery Date: 03/06/2023 Pre-Op Date: 01/13/2023  HPI: A 69 Yr. old female patient . Patient states she had an eye exam in [place] around the first of the year, but knew she had cataracts from prior eye exams. Referred by Dr. Daphine Deutscher. Pt states "headlights normally blind me at night". Patient states her OS vision seems worse than her right eye. Patient states she has glasses, but does not wear them often. "I cant see good with the glasses anymore. I cant see print to read now". Pt denies having any new eye pain or discomfort Pt denies having any new flashes or floaters. 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 HIV diagnosis 1994.  Medical History: Dry Eyes Cataracts  Arthritis HIV since 1994 Bells palsy, Lt side of face 2021, gall bladder disease, liver disease, acid reflux  Social Never Smoked  Medication Doxycycline, Olmesartan, Paxlovid, Prezista, Ritonavir, Rosuvastatin, Tirosint, Tivicay, Triamterene, Clonidine, Transdermal  Sx/Procedures None  Drug Allergies  Contrast dye  History & Physical: Heent: cataracts NECK: supple without bruits LUNGS: lungs clear to auscultation CV: regular rate and rhythm Abdomen: soft and non-tender  Impression & Plan: Assessment: 1.  CATARACT AGE-RELATED COMBINED FORMS; Both Eyes (H25.813) 2.  Hyperopia ; Both Eyes (H52.03) 3.  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  Cataracts are visually significant and account for the patient's complaints. Discussed all risks, benefits, procedures and recovery, including infection, loss of vision and eye, need for glasses after surgery or additional procedures. Patient understands changing glasses will not improve vision. Patient indicated understanding of procedure. All questions  answered. Patient desires to have surgery, recommend phacoemulsification with intraocular lens. Patient to have preliminary testing necessary ([employer]/IOL Master, Mac OCT, TOPO) Educational materials provided.  Plan: - Proceed with surgery OS, followed by OD - Plan for DIB00 with -0.25 target - no fuchs, no DM - able to lie flat - no prior eye surgeries  2.  Continue with current glasses for now  3.  Hx of Bells OS in 2021 that has now completely resolved. No signs of corneal dryness on exam today.

## 2023-03-04 ENCOUNTER — Encounter (HOSPITAL_COMMUNITY)
Admission: RE | Admit: 2023-03-04 | Discharge: 2023-03-04 | Disposition: A | Discharge status: Home / Self Care | Payer: 59 | Source: Ambulatory Visit | Attending: Optometry | Admitting: Optometry

## 2023-03-04 HISTORY — DX: Bell's palsy: G51.0

## 2023-03-04 NOTE — Pre-Procedure Instructions (Signed)
Attempted pre-op phonecall. Left VM for her to call us back. 

## 2023-03-05 ENCOUNTER — Encounter (HOSPITAL_COMMUNITY): Payer: Self-pay

## 2023-03-05 ENCOUNTER — Other Ambulatory Visit: Payer: Self-pay

## 2023-03-06 ENCOUNTER — Encounter (HOSPITAL_COMMUNITY): Payer: Self-pay | Admitting: Optometry

## 2023-03-06 ENCOUNTER — Encounter (HOSPITAL_COMMUNITY)
Admission: RE | Disposition: A | Discharge status: Home / Self Care | Payer: Self-pay | Source: Home / Self Care | Attending: Optometry

## 2023-03-06 ENCOUNTER — Ambulatory Visit (HOSPITAL_BASED_OUTPATIENT_CLINIC_OR_DEPARTMENT_OTHER): Payer: 59 | Admitting: Anesthesiology

## 2023-03-06 ENCOUNTER — Ambulatory Visit (HOSPITAL_COMMUNITY)
Admission: RE | Admit: 2023-03-06 | Discharge: 2023-03-06 | Disposition: A | Discharge status: Home / Self Care | Payer: 59 | Attending: Optometry | Admitting: Optometry

## 2023-03-06 ENCOUNTER — Ambulatory Visit (HOSPITAL_COMMUNITY): Payer: 59 | Admitting: Anesthesiology

## 2023-03-06 DIAGNOSIS — F419 Anxiety disorder, unspecified: Secondary | ICD-10-CM | POA: Insufficient documentation

## 2023-03-06 DIAGNOSIS — Z21 Asymptomatic human immunodeficiency virus [HIV] infection status: Secondary | ICD-10-CM | POA: Diagnosis not present

## 2023-03-06 DIAGNOSIS — G510 Bell's palsy: Secondary | ICD-10-CM | POA: Insufficient documentation

## 2023-03-06 DIAGNOSIS — Z87891 Personal history of nicotine dependence: Secondary | ICD-10-CM | POA: Diagnosis not present

## 2023-03-06 DIAGNOSIS — K219 Gastro-esophageal reflux disease without esophagitis: Secondary | ICD-10-CM | POA: Diagnosis not present

## 2023-03-06 DIAGNOSIS — H2512 Age-related nuclear cataract, left eye: Secondary | ICD-10-CM

## 2023-03-06 DIAGNOSIS — I10 Essential (primary) hypertension: Secondary | ICD-10-CM | POA: Diagnosis not present

## 2023-03-06 DIAGNOSIS — E785 Hyperlipidemia, unspecified: Secondary | ICD-10-CM | POA: Insufficient documentation

## 2023-03-06 DIAGNOSIS — M1990 Unspecified osteoarthritis, unspecified site: Secondary | ICD-10-CM | POA: Insufficient documentation

## 2023-03-06 DIAGNOSIS — H25812 Combined forms of age-related cataract, left eye: Secondary | ICD-10-CM

## 2023-03-06 DIAGNOSIS — Z6839 Body mass index (BMI) 39.0-39.9, adult: Secondary | ICD-10-CM | POA: Diagnosis not present

## 2023-03-06 DIAGNOSIS — E039 Hypothyroidism, unspecified: Secondary | ICD-10-CM | POA: Diagnosis not present

## 2023-03-06 HISTORY — PX: CATARACT EXTRACTION W/PHACO: SHX586

## 2023-03-06 SURGERY — PHACOEMULSIFICATION, CATARACT, WITH IOL INSERTION
Anesthesia: Monitor Anesthesia Care | Site: Eye | Laterality: Left

## 2023-03-06 MED ORDER — MIDAZOLAM HCL 2 MG/2ML IJ SOLN
INTRAMUSCULAR | Status: AC
  Filled 2023-03-06: qty 2

## 2023-03-06 MED ORDER — SIGHTPATH DOSE#1 NA HYALUR & NA CHOND-NA HYALUR IO KIT
PACK | INTRAOCULAR | Status: DC | PRN
  Administered 2023-03-06: 1 via OPHTHALMIC

## 2023-03-06 MED ORDER — TROPICAMIDE 1 % OP SOLN
1.0000 [drp] | OPHTHALMIC | Status: AC | PRN
  Administered 2023-03-06 (×3): 1 [drp] via OPHTHALMIC

## 2023-03-06 MED ORDER — LIDOCAINE HCL 3.5 % OP GEL
1.0000 | Freq: Once | OPHTHALMIC | Status: AC
  Administered 2023-03-06: 1 via OPHTHALMIC

## 2023-03-06 MED ORDER — STERILE WATER FOR IRRIGATION IR SOLN
Status: DC | PRN
  Administered 2023-03-06: 250 mL

## 2023-03-06 MED ORDER — TETRACAINE HCL 0.5 % OP SOLN
1.0000 [drp] | OPHTHALMIC | Status: AC | PRN
  Administered 2023-03-06 (×3): 1 [drp] via OPHTHALMIC

## 2023-03-06 MED ORDER — POVIDONE-IODINE 5 % OP SOLN
OPHTHALMIC | Status: DC | PRN
  Administered 2023-03-06: 1 via OPHTHALMIC

## 2023-03-06 MED ORDER — BSS IO SOLN
INTRAOCULAR | Status: DC | PRN
  Administered 2023-03-06: 15 mL via INTRAOCULAR

## 2023-03-06 MED ORDER — PHENYLEPHRINE HCL 2.5 % OP SOLN
1.0000 [drp] | OPHTHALMIC | Status: AC | PRN
  Administered 2023-03-06 (×3): 1 [drp] via OPHTHALMIC

## 2023-03-06 MED ORDER — LIDOCAINE HCL (PF) 1 % IJ SOLN
INTRAMUSCULAR | Status: DC | PRN
  Administered 2023-03-06: 1 mL

## 2023-03-06 MED ORDER — PHENYLEPHRINE-KETOROLAC 1-0.3 % IO SOLN
INTRAOCULAR | Status: DC | PRN
  Administered 2023-03-06: 500 mL via OPHTHALMIC

## 2023-03-06 MED ORDER — NEOMYCIN-POLYMYXIN-DEXAMETH 3.5-10000-0.1 OP SUSP
OPHTHALMIC | Status: DC | PRN
  Administered 2023-03-06: 2 [drp] via OPHTHALMIC

## 2023-03-06 SURGICAL SUPPLY — 14 items
CATARACT SUITE SIGHTPATH (MISCELLANEOUS) ×1 IMPLANT
CLOTH BEACON ORANGE TIMEOUT ST (SAFETY) ×2 IMPLANT
DRSG TEGADERM 4X4.75 (GAUZE/BANDAGES/DRESSINGS) ×2 IMPLANT
EYE SHIELD UNIVERSAL CLEAR (GAUZE/BANDAGES/DRESSINGS) IMPLANT
FEE CATARACT SUITE SIGHTPATH (MISCELLANEOUS) ×2 IMPLANT
GLOVE BIOGEL PI IND STRL 7.0 (GLOVE) ×4 IMPLANT
LENS IOL TECNIS EYHANCE 24.0 (Intraocular Lens) IMPLANT
NDL HYPO 18GX1.5 BLUNT FILL (NEEDLE) ×2 IMPLANT
NEEDLE HYPO 18GX1.5 BLUNT FILL (NEEDLE) ×1 IMPLANT
PAD ARMBOARD 7.5X6 YLW CONV (MISCELLANEOUS) ×2 IMPLANT
POSITIONER HEAD 8X9X4 ADT (SOFTGOODS) ×2 IMPLANT
SYR TB 1ML LL NO SAFETY (SYRINGE) ×2 IMPLANT
TAPE SURG TRANSPORE 1 IN (GAUZE/BANDAGES/DRESSINGS) IMPLANT
WATER STERILE IRR 250ML POUR (IV SOLUTION) ×2 IMPLANT

## 2023-03-06 NOTE — Op Note (Signed)
Date of procedure: 03/06/23  Pre-operative diagnosis: Visually significant age-related nuclear cataract, Left Eye (H25.12)  Post-operative diagnosis: Visually significant age-related nuclear cataract, Left Eye  Procedure: Removal of cataract via phacoemulsification and insertion of intra-ocular lens J&J DIB00 +24.0D into the capsular bag of the Left Eye  Attending surgeon: Ronal Fear, MD  Anesthesia: MAC, Topical Akten  Complications: None  Estimated Blood Loss: <95mL (minimal)  Specimens: None  Implants:  Implant Name Type Inv. Item Serial No. Manufacturer Lot No. LRB No. Used Action  LENS IOL TECNIS EYHANCE 24.0 - [id number] Intraocular Lens LENS IOL TECNIS EYHANCE 24.0 [id number] [employer]  Left 1 Implanted    Indications:  Visually significant age-related cataract, Left Eye  Procedure:  The patient was seen and identified in the pre-operative area. The operative eye was identified and dilated.  The operative eye was marked.  Topical anesthesia was administered to the operative eye.     The patient was then to the operative suite and placed in the supine position.  A timeout was performed confirming the patient, procedure to be performed, and all other relevant information.   The patient's face was prepped and draped in the usual fashion for intra-ocular surgery.  A lid speculum was placed into the operative eye and the surgical microscope moved into place and focused.  An inferotemporal paracentesis was created using a 20 gauge paracentesis blade.  BSS mixed with Omidria, followed by 1% lidocaine was injected into the anterior chamber.  Viscoelastic was injected into the anterior chamber.  A temporal clear-corneal main wound incision was created using a 2.25mm microkeratome.  A continuous curvilinear capsulorrhexis was initiated using an irrigating cystitome and completed using capsulorrhexis forceps.  Hydrodissection and hydrodeliniation were performed.  Viscoelastic was  injected into the anterior chamber.  A phacoemulsification handpiece and a chopper as a second instrument were used to remove the nucleus and epinucleus. The irrigation/aspiration handpiece was used to remove any remaining cortical material.   The capsular bag was reinflated with viscoelastic, checked, and found to be intact.  The intraocular lens was inserted into the capsular bag.  The irrigation/aspiration handpiece was used to remove any remaining viscoelastic.  The clear corneal wound and paracentesis wounds were then hydrated and checked with Weck-Cels to be watertight.  The lid-speculum and drape was removed, and the patient's face was cleaned with a wet and dry 4x4.  Maxitrol drops were instilled onto the eye. A clear shield was taped over the eye. The patient was taken to the post-operative care unit in good condition, having tolerated the procedure well.  Post-Op Instructions: The patient will follow up at [facility] for a same day post-operative evaluation and will receive all other orders and instructions.

## 2023-03-06 NOTE — Anesthesia Preprocedure Evaluation (Addendum)
Anesthesia Evaluation  Patient identified by MRN, date of birth, ID band Patient awake    Reviewed: Allergy & Precautions, H&P , NPO status , Patient's Chart, lab work & pertinent test results, reviewed documented beta blocker date and time   Airway Mallampati: II  TM Distance: >3 FB Neck ROM: Full    Dental  (+) Edentulous Upper, Edentulous Lower   Pulmonary neg pulmonary ROS, former smoker   Pulmonary exam normal breath sounds clear to auscultation       Cardiovascular Exercise Tolerance: Good hypertension, Pt. on medications and Pt. on home beta blockers Normal cardiovascular exam Rhythm:Regular Rate:Normal     Neuro/Psych  PSYCHIATRIC DISORDERS Anxiety      Neuromuscular disease CVA    GI/Hepatic Neg liver ROS,GERD  Medicated and Controlled,,  Endo/Other  Hypothyroidism    Renal/GU negative Renal ROS  negative genitourinary   Musculoskeletal  (+) Arthritis , Osteoarthritis,    Abdominal   Peds negative pediatric ROS (+)  Hematology  (+) HIV  Anesthesia Other Findings   Reproductive/Obstetrics negative OB ROS                             Anesthesia Physical Anesthesia Plan  ASA: 3  Anesthesia Plan: General and MAC   Post-op Pain Management: Minimal or no pain anticipated   Induction: Intravenous  PONV Risk Score and Plan: Treatment may vary due to age or medical condition  Airway Management Planned: Natural Airway and Nasal Cannula  Additional Equipment:   Intra-op Plan:   Post-operative Plan:   Informed Consent: I have reviewed the patients History and Physical, chart, labs and discussed the procedure including the risks, benefits and alternatives for the proposed anesthesia with the patient or authorized representative who has indicated his/her understanding and acceptance.     Dental advisory given  Plan Discussed with: CRNA and Surgeon  Anesthesia Plan Comments:         Anesthesia Quick Evaluation

## 2023-03-06 NOTE — Interval H&P Note (Signed)
History and Physical Interval Note:  03/06/2023 11:59 AM  The H and P was reviewed and updated. The patient was examined.  No changes were found after exam.  The surgical eye was marked.  Elfie Costanza

## 2023-03-06 NOTE — Anesthesia Postprocedure Evaluation (Signed)
Anesthesia Post Note  Patient: Laura Jimenez  Procedure(s) Performed: CATARACT EXTRACTION PHACO AND INTRAOCULAR LENS PLACEMENT (IOC) (Left: Eye)  Patient location during evaluation: Short Stay Anesthesia Type: MAC Level of consciousness: awake and alert Pain management: pain level controlled Vital Signs Assessment: post-procedure vital signs reviewed and stable Respiratory status: spontaneous breathing Cardiovascular status: blood pressure returned to baseline and stable Postop Assessment: no apparent nausea or vomiting Anesthetic complications: no   No notable events documented.   Last Vitals:  Vitals:   03/06/23 1158 03/06/23 1345  BP: (!) 164/72 (!) 154/77  Pulse: 60 (!) 54  Resp: 11 16  Temp: 36.8 C (!) 36.4 C  SpO2: 100% 100%    Last Pain:  Vitals:   03/06/23 1345  TempSrc:   PainSc: 0-No pain                 Dawanda Mapel

## 2023-03-06 NOTE — Discharge Instructions (Signed)
Please discharge patient when stable, will follow up today with Dr. Avonelle Viveros at the [facility] office immediately following discharge.  Leave shield in place until visit.  All paperwork with discharge instructions will be given at the office.  [facility] Address:  [address]  [place], [place] [address]  Dr. Druscilla Petsch's Phone: [phone]  

## 2023-03-06 NOTE — Transfer of Care (Signed)
Immediate Anesthesia Transfer of Care Note  Patient: Laura Jimenez  Procedure(s) Performed: CATARACT EXTRACTION PHACO AND INTRAOCULAR LENS PLACEMENT (IOC) (Left: Eye)  Patient Location: Short Stay  Anesthesia Type:MAC  Level of Consciousness: awake  Airway & Oxygen Therapy: Patient Spontanous Breathing  Post-op Assessment: Report given to RN  Post vital signs: Reviewed and stable  Last Vitals:  Vitals Value Taken Time  BP    Temp    Pulse    Resp    SpO2      Last Pain:  Vitals:   03/06/23 1345  TempSrc:   PainSc: 0-No pain         Complications: No notable events documented.

## 2023-03-09 ENCOUNTER — Other Ambulatory Visit: Payer: Self-pay

## 2023-03-10 ENCOUNTER — Encounter (HOSPITAL_COMMUNITY): Payer: Self-pay | Admitting: Optometry

## 2023-03-12 ENCOUNTER — Other Ambulatory Visit (HOSPITAL_COMMUNITY): Payer: Self-pay

## 2023-03-12 ENCOUNTER — Other Ambulatory Visit: Payer: Self-pay

## 2023-03-12 ENCOUNTER — Ambulatory Visit: Payer: 59 | Admitting: Pharmacist

## 2023-03-12 DIAGNOSIS — B20 Human immunodeficiency virus [HIV] disease: Secondary | ICD-10-CM | POA: Diagnosis not present

## 2023-03-12 MED ORDER — LENACAPAVIR SODIUM 463.5 MG/1.5ML ~~LOC~~ SOLN
927.0000 mg | Freq: Once | SUBCUTANEOUS | Status: AC
Start: 2023-03-12 — End: 2023-03-12
  Administered 2023-03-12: 927 mg via SUBCUTANEOUS

## 2023-03-12 NOTE — H&P (Signed)
Surgical History & Physical  Patient Name: Laura Jimenez  DOB: Jul 30, 1951  Surgery: Cataract extraction with intraocular lens implant phacoemulsification; Right Eye Surgeon: Pecolia Ades MD Surgery Date: 03/20/2023 Pre-Op Date: 03/10/2023  HPI: A 53 Yr. old female patient 1. The patient is returning after cataract surgery. The left eye is affected. Since the last visit, the affected area is doing well. The patient's vision is improved. The condition's severity is constant. Patient is following medication instructions. Difficulties watching TV, reading fine print, glare when driving at night and during the day due to glare because of the cataract OD. This is negatively affecting the patient's quality of life and the patient is unable to function adequately in life with the current level of vision. HPI was performed by Pecolia Ades .  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  CATARACT EXTRACTION STATUS; Left Eye (Z98.42) 2.  INTRAOCULAR LENS IOL (Z96.1) 3.  CATARACT AGE-RELATED COMBINED FORMS; Both Eyes (H25.813)  Plan: 1.  POD4 exam, Doing well. All post-op precautions discussed and instructions reviewed. Written instructions given.  2.  See ablove  3.  Cataracts are visually significant and account for the patient's complaints. Discussed all risks, benefits, procedures and recovery, including  infection, loss of vision and eye, need for glasses after surgery or additional procedures. Patient understands changing glasses will not improve vision. Patient indicated understanding of procedure. All questions answered. Patient desires to have surgery, recommend phacoemulsification with intraocular lens. Patient to have preliminary testing necessary ([employer]/IOL Master, Mac OCT, TOPO) Educational materials provided.  Plan: - Proceed with surgery OD - Plan for DIB00 with -0.25 target - no fuchs, no DM - able to lie flat - no prior eye surgeries

## 2023-03-12 NOTE — Progress Notes (Signed)
HPI: Laura Jimenez is a 72 y.o. female who presents to the [facility] pharmacy clinic for [facility] administration.  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   ALLOPURINOL (ZYLOPRIM) 100 MG TABLET    Prescription ran out   ASCORBIC ACID (VITAMIN C) 500 MG TABLET    Take 1 tablet by mouth daily.   ASPIRIN (PX ENTERIC ASPIRIN) 81 MG EC TABLET    Take 1 tablet (81 mg total) by mouth daily. Swallow whole.   ASPIRIN-SALICYLAMIDE-CAFFEINE (BC HEADACHE POWDER PO)    Take by mouth.   ATORVASTATIN (LIPITOR) 10 MG TABLET    Take 1 tablet by mouth daily.   AZITHROMYCIN (ZITHROMAX) 250 MG TABLET    Take by mouth.   BICTEGRAVIR-EMTRICITABINE-TENOFOVIR AF (BIKTARVY) 50-200-25 MG TABS TABLET    Take 1 tablet by mouth daily.   BROMPHENIRAMINE-PSEUDOEPHEDRINE-DM  30-2-10 MG/5ML SYRUP    Take 5 mLs by mouth 4 (four) times daily as needed.   LENACAPAVIR (SUNLENCA) 300 MG TABLET    Take 2 tablets by mouth See admin instructions. This is the 2-day initiation kit: Take two 300-mg tablets on Day 1 & Day 2 with or without food.   LEVOTHYROXINE (SYNTHROID) 25 MCG TABLET    Take 25 mcg by mouth every morning.   METOPROLOL SUCCINATE (TOPROL-XL) 25 MG 24 HR TABLET    Take 25 mg by mouth at bedtime.   OLMESARTAN (BENICAR) 20 MG TABLET    Take 40 mg by mouth daily.   PANTOPRAZOLE (PROTONIX) 40 MG TABLET    Take 40 mg by mouth daily.   SQ INJECTION LENACAPAVIR (SUNLENCA) 463.5 MG/1.5ML SQ INJECTION    Inject 3 mLs (927 mg total) into the skin every 6 (six) months. Administer each injection subcutaneously at separate sites in the abdomen (more or equal to 2 inches from the navel).   TETRAHYDROZOLINE 0.05 % OPHTHALMIC SOLUTION    Place 1 drop into both eyes daily as needed (red eyes).   TRIAMTERENE-HYDROCHLOROTHIAZIDE (MAXZIDE-25) 37.5-25 MG TABLET    Take 1 tablet by mouth daily.   VITAMIN D, ERGOCALCIFEROL, (DRISDOL) 1.25 MG (50000 UNIT) CAPS CAPSULE    Take 50,000 Units by mouth every 7 (seven) days.  Modified Medications   No medications on file  Discontinued Medications   DOXYCYCLINE (VIBRAMYCIN) 100 MG CAPSULE    Take 1 capsule (100 mg total) by mouth 2 (two) times  daily.    Allergies: Allergies  Allergen Reactions   Gadolinium Derivatives Nausea And Vomiting    Vomited with multihance 12/05/19 Vomited with multihance 12/05/19   Amlodipine Other (See Comments)   Ivp Dye [Iodinated Contrast Media] Other (See Comments)    Caused kidney damage   Penicillins Swelling   Tape Dermatitis   Cephalexin Rash   Latex Rash    Past Medical History: Past Medical History:  Diagnosis Date   Anxiety    Arthritis    Bell's palsy    left eye   Cancer (HCC)    CVA (cerebral infarction)    Eye swelling 07/31/2021   GERD (gastroesophageal reflux disease)    Gout     HIV infection (HCC)    HTN (hypertension)    HTN (hypertension) 02/10/2023   Hyperlipidemia    Morbid obesity with BMI of 40.0-44.9, adult (HCC)    Postmenopausal bleeding     Social History: Social History   Socioeconomic History   Marital status: Widowed    Spouse name: Not on file   Number of children: Not on file   Years of education: Not on file   Highest education level: 10th grade  Occupational History   Not on file  Tobacco Use   Smoking status: Former    Current packs/day: 0.00    Average packs/day: 1 pack/day for 5.0 years (5.0 ttl pk-yrs)    Types: Cigarettes    Quit date: 34    Years since quitting: 34.5    Passive exposure: Never   Smokeless tobacco: Never  Vaping Use   Vaping status: Never Used  Substance and Sexual Activity   Alcohol use: No   Drug use: No   Sexual activity: Not Currently    Birth control/protection: None, Post-menopausal  Other Topics Concern   Not on file  Social History Narrative   Grew up in [place], [place].   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   2 children, boy and girl. Son is 40s, and special needs.   Daughter in her 96s, is a Engineer, civil (consulting).   Eats all food groups.   Wears seatbelt.   Attends church.   Enjoys watching tv, listening to [employer], and spending time with friends.   Enjoys going out to eat.    Social Determinants of Health   Financial Resource Strain: Low Risk  (06/27/2020)   Overall Financial Resource Strain (CARDIA)    Difficulty of Paying Living Expenses: Not hard at all  Food Insecurity: No Food Insecurity (06/27/2020)   Hunger Vital Sign    Worried About Running Out of Food in the Last Year: Never true    Ran Out of Food in the Last Year: Never true  Transportation Needs: No Transportation Needs (06/27/2020)   PRAPARE - Administrator, Civil Service (Medical): No    Lack of Transportation (Non-Medical): No  Physical Activity: Sufficiently Active (06/27/2020)   Exercise Vital Sign     Days of Exercise per Week: 7 days    Minutes of Exercise per Session: 60 min  Stress: Stress Concern Present (07/03/2020)   [employer] of Occupational Health - Occupational Stress Questionnaire    Feeling of Stress : To some extent  Social Connections: Moderately Isolated (06/27/2020)   Social Connection and Isolation Panel [NHANES]    Frequency of Communication with Friends and Family: More than three times a week    Frequency of Social Gatherings with Friends and Family: Once a week  Attends Religious Services: More than 4 times per year    Active Member of Clubs or Organizations: No    Attends Banker Meetings: Never    Marital Status: Widowed    Labs: Lab Results  Component Value Date   HIV1RNAQUANT 530 (H) 02/10/2023   HIV1RNAQUANT 113 (H) 12/22/2022   HIV1RNAQUANT 335 (H) 11/24/2022   CD4TABS 283 (L) 02/10/2023   CD4TABS 224 (L) 11/24/2022   CD4TABS 319 (L) 07/31/2021    RPR and STI Lab Results  Component Value Date   LABRPR NON-REACTIVE 02/10/2023   LABRPR NON-REACTIVE 11/24/2022   LABRPR NON-REACTIVE 10/07/2021   LABRPR NON-REACTIVE 07/31/2021   LABRPR NON-REACTIVE 03/25/2021    STI Results GC CT  02/10/2023  2:33 PM Negative  Negative   01/04/2021  1:52 PM Negative  Negative   06/04/2020 10:53 AM Negative  Negative     Hepatitis B Lab Results  Component Value Date   HEPBSAB NON-REACTIVE 06/04/2020   HEPBSAG NON-REACTIVE 06/04/2020   HEPBCAB NON-REACTIVE 06/04/2020   Hepatitis C Lab Results  Component Value Date   HEPCAB NON-REACTIVE 06/04/2020   Hepatitis A Lab Results  Component Value Date   HAV REACTIVE (A) 06/04/2020   Lipids: Lab Results  Component Value Date   CHOL 220 (H) 02/10/2023   TRIG 184 (H) 02/10/2023   HDL 44 (L) 02/10/2023   CHOLHDL 5.0 (H) 02/10/2023   LDLCALC 144 (H) 02/10/2023    Current HIV Regimen: Biktarvy  TARGET DATE: The 18th of the month  Assessment: Burnice presents today for  initiation of Sunlenca injections. Counseled patient that she will need to complete an oral loading dose. Counseled that patient will take two Sunlenca 300 mg tablets (600 mg total) orally today and will take two Sunlenca 300 mg tablets (600 mg total) orally tomorrow regardless of meals. Counseled patient that Ginette Pitman is two separate subcutaneous injections in the abdomen every 6 months. Will administer Sunlenca on left side of abdomen.    Observed patient take two tablets of lenacapavir 300 mg (600 mg total) in room. Gave patient the two tablets for tomorrow's dose to bring home. She had trouble initially swallowing whole but was able to swallow once it dissolved in her mouth for a few minutes. She states she will take the next two tablets when she takes [employer].    Administered lenacapavir 463.5/1.51mL subcutaneously in upper left abdomen. Administered lenacapavir 463.5/1.78mL subcutaneously in lower left abdomen more than two inches away from first injection site. Monitored patient for 10 minutes after injection. Injections were tolerated well without issue. Will need a follow up appointment for injections in 6 months.   She also states she has been taking Biktarvy every day without missed doses and denies any further issues with nausea and vomiting since her last visit. States her primary care provider is assessing further gastrointestinal concerns. Will check viral load today. She states that she feels as though she will never be undetectable while following at [facility] and that she was undetectable when following at [facility]. Reviewed her labs and demonstrated she has not been undetectable for long periods of time at either clinic. Also discussed that Biktarvy monotherapy would not sufficiently reduce her viral load as it is not fully active given prior resistance. She verbalized understanding and hopes Sunlenca will help her achieve undetectable status. She will see me in one month to check in and  reassess viral load; will follow up with Dr. Daiva Eves in 4 months.  Plan: - Continue Biktarvy once daily  - Two Sunlenca 300 mg tablets (600 mg total) given in clinic - First Sunlenca injections given - Take two Sunlenca 300 mg tablets (600 mg total) tomorrow - Check HIV RNA  - Follow up with me on 8/20 for adherence check-in and viral load - Follow up with Dr. Daiva Eves on 11/18 - Schedule Sunlenca injections in 6 months (on 09/03/2023)  - Contact for any issues or questions  Margarite Gouge, PharmD, CPP, BCIDP, AAHIVP Clinical Pharmacist Practitioner Infectious Diseases Clinical Pharmacist [facility]

## 2023-03-15 LAB — HIV-1 RNA QUANT-NO REFLEX-BLD
HIV 1 RNA Quant: 36 Copies/mL — ABNORMAL HIGH
HIV-1 RNA Quant, Log: 1.56 Log cps/mL — ABNORMAL HIGH

## 2023-03-16 ENCOUNTER — Encounter (HOSPITAL_COMMUNITY)
Admission: RE | Admit: 2023-03-16 | Discharge: 2023-03-16 | Disposition: A | Discharge status: Home / Self Care | Payer: 59 | Source: Ambulatory Visit | Attending: Optometry | Admitting: Optometry

## 2023-03-16 ENCOUNTER — Encounter (HOSPITAL_COMMUNITY): Payer: Self-pay

## 2023-03-16 ENCOUNTER — Other Ambulatory Visit (HOSPITAL_COMMUNITY): Payer: Self-pay

## 2023-03-16 ENCOUNTER — Other Ambulatory Visit: Payer: Self-pay

## 2023-03-17 ENCOUNTER — Other Ambulatory Visit (HOSPITAL_COMMUNITY): Payer: Self-pay

## 2023-03-20 ENCOUNTER — Ambulatory Visit (HOSPITAL_COMMUNITY)
Admission: RE | Admit: 2023-03-20 | Discharge: 2023-03-20 | Disposition: A | Discharge status: Home / Self Care | Payer: 59 | Attending: Optometry | Admitting: Optometry

## 2023-03-20 ENCOUNTER — Encounter (HOSPITAL_COMMUNITY)
Admission: RE | Disposition: A | Discharge status: Home / Self Care | Payer: Self-pay | Source: Home / Self Care | Attending: Optometry

## 2023-03-20 ENCOUNTER — Ambulatory Visit (HOSPITAL_COMMUNITY): Payer: 59 | Admitting: Certified Registered"

## 2023-03-20 ENCOUNTER — Ambulatory Visit (HOSPITAL_BASED_OUTPATIENT_CLINIC_OR_DEPARTMENT_OTHER): Payer: 59 | Admitting: Certified Registered"

## 2023-03-20 DIAGNOSIS — G709 Myoneural disorder, unspecified: Secondary | ICD-10-CM | POA: Insufficient documentation

## 2023-03-20 DIAGNOSIS — E785 Hyperlipidemia, unspecified: Secondary | ICD-10-CM | POA: Insufficient documentation

## 2023-03-20 DIAGNOSIS — Z6839 Body mass index (BMI) 39.0-39.9, adult: Secondary | ICD-10-CM | POA: Diagnosis not present

## 2023-03-20 DIAGNOSIS — E039 Hypothyroidism, unspecified: Secondary | ICD-10-CM | POA: Insufficient documentation

## 2023-03-20 DIAGNOSIS — I10 Essential (primary) hypertension: Secondary | ICD-10-CM | POA: Insufficient documentation

## 2023-03-20 DIAGNOSIS — Z87891 Personal history of nicotine dependence: Secondary | ICD-10-CM | POA: Insufficient documentation

## 2023-03-20 DIAGNOSIS — Z79899 Other long term (current) drug therapy: Secondary | ICD-10-CM | POA: Diagnosis not present

## 2023-03-20 DIAGNOSIS — M1990 Unspecified osteoarthritis, unspecified site: Secondary | ICD-10-CM | POA: Diagnosis not present

## 2023-03-20 DIAGNOSIS — Z21 Asymptomatic human immunodeficiency virus [HIV] infection status: Secondary | ICD-10-CM | POA: Diagnosis not present

## 2023-03-20 DIAGNOSIS — F419 Anxiety disorder, unspecified: Secondary | ICD-10-CM | POA: Insufficient documentation

## 2023-03-20 DIAGNOSIS — H25811 Combined forms of age-related cataract, right eye: Secondary | ICD-10-CM | POA: Diagnosis not present

## 2023-03-20 DIAGNOSIS — H2511 Age-related nuclear cataract, right eye: Secondary | ICD-10-CM | POA: Insufficient documentation

## 2023-03-20 DIAGNOSIS — K219 Gastro-esophageal reflux disease without esophagitis: Secondary | ICD-10-CM | POA: Insufficient documentation

## 2023-03-20 HISTORY — PX: CATARACT EXTRACTION W/PHACO: SHX586

## 2023-03-20 SURGERY — PHACOEMULSIFICATION, CATARACT, WITH IOL INSERTION
Anesthesia: Monitor Anesthesia Care | Site: Eye | Laterality: Right

## 2023-03-20 MED ORDER — MIDAZOLAM HCL 2 MG/2ML IJ SOLN
INTRAMUSCULAR | Status: AC
  Filled 2023-03-20: qty 2

## 2023-03-20 MED ORDER — SIGHTPATH DOSE#1 NA HYALUR & NA CHOND-NA HYALUR IO KIT
PACK | INTRAOCULAR | Status: DC | PRN
  Administered 2023-03-20: 1 via OPHTHALMIC

## 2023-03-20 MED ORDER — PHENYLEPHRINE-KETOROLAC 1-0.3 % IO SOLN
INTRAOCULAR | Status: DC | PRN
  Administered 2023-03-20: 500 mL via OPHTHALMIC

## 2023-03-20 MED ORDER — BSS IO SOLN
INTRAOCULAR | Status: DC | PRN
  Administered 2023-03-20: 15 mL via INTRAOCULAR

## 2023-03-20 MED ORDER — NEOMYCIN-POLYMYXIN-DEXAMETH 3.5-10000-0.1 OP SUSP
OPHTHALMIC | Status: DC | PRN
  Administered 2023-03-20: 2 [drp] via OPHTHALMIC

## 2023-03-20 MED ORDER — TETRACAINE HCL 0.5 % OP SOLN
1.0000 [drp] | OPHTHALMIC | Status: AC
  Administered 2023-03-20 (×3): 1 [drp] via OPHTHALMIC

## 2023-03-20 MED ORDER — STERILE WATER FOR IRRIGATION IR SOLN
Status: DC | PRN
  Administered 2023-03-20: 1

## 2023-03-20 MED ORDER — PHENYLEPHRINE HCL 2.5 % OP SOLN
1.0000 [drp] | OPHTHALMIC | Status: AC
  Administered 2023-03-20 (×3): 1 [drp] via OPHTHALMIC

## 2023-03-20 MED ORDER — POVIDONE-IODINE 5 % OP SOLN
OPHTHALMIC | Status: DC | PRN
  Administered 2023-03-20: 1 via OPHTHALMIC

## 2023-03-20 MED ORDER — LIDOCAINE HCL 3.5 % OP GEL
1.0000 | Freq: Once | OPHTHALMIC | Status: AC
  Administered 2023-03-20: 1 via OPHTHALMIC

## 2023-03-20 MED ORDER — LIDOCAINE HCL (PF) 1 % IJ SOLN
INTRAMUSCULAR | Status: DC | PRN
  Administered 2023-03-20: 1 mL

## 2023-03-20 MED ORDER — MIDAZOLAM HCL 2 MG/2ML IJ SOLN
INTRAMUSCULAR | Status: DC | PRN
  Administered 2023-03-20: 1 mg via INTRAVENOUS

## 2023-03-20 MED ORDER — TROPICAMIDE 1 % OP SOLN
1.0000 [drp] | OPHTHALMIC | Status: AC
  Administered 2023-03-20 (×3): 1 [drp] via OPHTHALMIC

## 2023-03-20 MED ORDER — SODIUM CHLORIDE 0.9% FLUSH
INTRAVENOUS | Status: DC | PRN
  Administered 2023-03-20: 6 mL via INTRAVENOUS

## 2023-03-20 SURGICAL SUPPLY — 14 items
CATARACT SUITE SIGHTPATH (MISCELLANEOUS) ×1 IMPLANT
CLOTH BEACON ORANGE TIMEOUT ST (SAFETY) ×2 IMPLANT
DRSG TEGADERM 4X4.75 (GAUZE/BANDAGES/DRESSINGS) ×2 IMPLANT
EYE SHIELD UNIVERSAL CLEAR (GAUZE/BANDAGES/DRESSINGS) IMPLANT
FEE CATARACT SUITE SIGHTPATH (MISCELLANEOUS) ×2 IMPLANT
GLOVE BIOGEL PI IND STRL 7.0 (GLOVE) ×4 IMPLANT
LENS IOL TECNIS EYHANCE 24.5 (Intraocular Lens) IMPLANT
NDL HYPO 18GX1.5 BLUNT FILL (NEEDLE) ×2 IMPLANT
NEEDLE HYPO 18GX1.5 BLUNT FILL (NEEDLE) ×1 IMPLANT
PAD ARMBOARD 7.5X6 YLW CONV (MISCELLANEOUS) ×2 IMPLANT
POSITIONER HEAD 8X9X4 ADT (SOFTGOODS) ×2 IMPLANT
SYR TB 1ML LL NO SAFETY (SYRINGE) ×2 IMPLANT
TAPE PAPER 2X10 WHT MICROPORE (GAUZE/BANDAGES/DRESSINGS) IMPLANT
WATER STERILE IRR 250ML POUR (IV SOLUTION) ×2 IMPLANT

## 2023-03-20 NOTE — Anesthesia Preprocedure Evaluation (Signed)
Anesthesia Evaluation  Patient identified by MRN, date of birth, ID band Patient awake    Reviewed: Allergy & Precautions, H&P , NPO status , Patient's Chart, lab work & pertinent test results, reviewed documented beta blocker date and time   Airway Mallampati: II  TM Distance: >3 FB Neck ROM: Full    Dental  (+) Edentulous Upper, Edentulous Lower   Pulmonary neg pulmonary ROS, former smoker   Pulmonary exam normal breath sounds clear to auscultation       Cardiovascular Exercise Tolerance: Good hypertension, Pt. on medications and Pt. on home beta blockers Normal cardiovascular exam Rhythm:Regular Rate:Normal     Neuro/Psych  PSYCHIATRIC DISORDERS Anxiety      Neuromuscular disease CVA    GI/Hepatic Neg liver ROS,GERD  Medicated and Controlled,,  Endo/Other  Hypothyroidism    Renal/GU negative Renal ROS  negative genitourinary   Musculoskeletal  (+) Arthritis , Osteoarthritis,    Abdominal   Peds negative pediatric ROS (+)  Hematology  (+) HIV  Anesthesia Other Findings   Reproductive/Obstetrics negative OB ROS                             Anesthesia Physical Anesthesia Plan  ASA: 3  Anesthesia Plan: General and MAC   Post-op Pain Management: Minimal or no pain anticipated   Induction: Intravenous  PONV Risk Score and Plan: Treatment may vary due to age or medical condition  Airway Management Planned: Natural Airway and Nasal Cannula  Additional Equipment:   Intra-op Plan:   Post-operative Plan:   Informed Consent: I have reviewed the patients History and Physical, chart, labs and discussed the procedure including the risks, benefits and alternatives for the proposed anesthesia with the patient or authorized representative who has indicated his/her understanding and acceptance.     Dental advisory given  Plan Discussed with: CRNA and Surgeon  Anesthesia Plan Comments:         Anesthesia Quick Evaluation

## 2023-03-20 NOTE — Discharge Instructions (Signed)
Please discharge patient when stable, will follow up today with Dr. Snyder at the [facility] [place] office immediately following discharge.  Leave shield in place until visit.  All paperwork with discharge instructions will be given at the office.  [facility] Address:  [address]  [place], [place] [address]  Dr. Snyder's Phone: [phone]  

## 2023-03-20 NOTE — Transfer of Care (Addendum)
Immediate Anesthesia Transfer of Care Note  Patient: Laura Jimenez  Procedure(s) Performed: CATARACT EXTRACTION PHACO AND INTRAOCULAR LENS PLACEMENT (IOC) (Right: Eye)  Patient Location: PACU and Short Stay  Anesthesia Type:MAC  Level of Consciousness: awake and patient cooperative  Airway & Oxygen Therapy: Patient Spontanous Breathing  Post-op Assessment: Report given to RN and Post -op Vital signs reviewed and stable  Post vital signs: Reviewed and stable  Last Vitals:  Vitals Value Taken Time  BP 139/77 03/20/23   1000  Temp 36.5 03/20/23   1000  Pulse 56 03/20/23   1000  Resp 16 03/20/23   1000  SpO2 100% 03/20/23   1000    Last Pain:  Vitals:   03/20/23 0832  TempSrc: (P) Oral  PainSc: (P) 0-No pain      Patients Stated Pain Goal: (P) 6 (03/20/23 7564)  Complications: No notable events documented.

## 2023-03-20 NOTE — Interval H&P Note (Signed)
History and Physical Interval Note:  03/20/2023 9:04 AM  The H and P was reviewed and updated. The patient was examined.  No changes were found after exam.  The surgical eye was marked.  Laura Jimenez

## 2023-03-20 NOTE — Op Note (Signed)
Date of procedure: 03/20/23  Pre-operative diagnosis: Visually significant age-related nuclear cataract, Right Eye (H25.11)  Post-operative diagnosis: Visually significant age-related nuclear cataract, Right Eye  Procedure: Removal of cataract via phacoemulsification and insertion of intra-ocular lens J&J DIBOO +24.5D into the capsular bag of the Right Eye  Attending surgeon: Pecolia Ades, MD  Anesthesia: MAC, Topical Akten  Complications: None  Estimated Blood Loss: <50mL (minimal)  Specimens: None  Implants:  Implant Name Type Inv. Item Serial No. Manufacturer Lot No. LRB No. Used Action  LENS IOL TECNIS EYHANCE 24.5 - [id number] Intraocular Lens LENS IOL TECNIS EYHANCE 24.5 [id number] [employer]IGHT[employer]  Right 1 Implanted    Indications:  Visually significant age-related cataract, Right Eye  Procedure:  The patient was seen and identified in the pre-operative area. The operative eye was identified and dilated.  The operative eye was marked.  Topical anesthesia was administered to the operative eye.     The patient was then to the operative suite and placed in the supine position.  A timeout was performed confirming the patient, procedure to be performed, and all other relevant information.   The patient's face was prepped and draped in the usual fashion for intra-ocular surgery.  A lid speculum was placed into the operative eye and the surgical microscope moved into place and focused.  A superotemporal paracentesis was created using a 20 gauge paracentesis blade.  BSS mixed with Omidria, followed by 1% lidocaine was injected into the anterior chamber.  Viscoelastic was injected into the anterior chamber.  A temporal clear-corneal main wound incision was created using a 2.42mm microkeratome.  A continuous curvilinear capsulorrhexis was initiated using an irrigating cystitome and completed using capsulorrhexis forceps.  Hydrodissection and hydrodeliniation were performed.  Viscoelastic was  injected into the anterior chamber.  A phacoemulsification handpiece and a chopper as a second instrument were used to remove the nucleus and epinucleus. The irrigation/aspiration handpiece was used to remove any remaining cortical material.   The capsular bag was reinflated with viscoelastic, checked, and found to be intact.  The intraocular lens was inserted into the capsular bag.  The irrigation/aspiration handpiece was used to remove any remaining viscoelastic.  The clear corneal wound and paracentesis wounds were then hydrated and checked with Weck-Cels to be watertight.  The lid-speculum and drape was removed, and the patient's face was cleaned with a wet and dry 4x4.  Maxitrol drops were instilled onto the eye. A clear shield was taped over the eye. The patient was taken to the post-operative care unit in good condition, having tolerated the procedure well.  Post-Op Instructions: The patient will follow up at [facility] for a same day post-operative evaluation and will receive all other orders and instructions.

## 2023-03-23 ENCOUNTER — Encounter (HOSPITAL_COMMUNITY): Payer: Self-pay | Admitting: Optometry

## 2023-03-23 NOTE — Anesthesia Postprocedure Evaluation (Signed)
Anesthesia Post Note  Patient: Laura Jimenez  Procedure(s) Performed: CATARACT EXTRACTION PHACO AND INTRAOCULAR LENS PLACEMENT (IOC) (Right: Eye)  Patient location during evaluation: Phase II Anesthesia Type: MAC Level of consciousness: awake Pain management: pain level controlled Vital Signs Assessment: post-procedure vital signs reviewed and stable Respiratory status: spontaneous breathing and respiratory function stable Cardiovascular status: blood pressure returned to baseline and stable Postop Assessment: no headache and no apparent nausea or vomiting Anesthetic complications: no Comments: Late entry   No notable events documented.   Last Vitals:  Vitals:   03/20/23 0832 03/20/23 1000  BP: (!) (P) 158/95 139/77  Pulse: (!) (P) 59 (!) 56  Resp:  16  Temp: (P) 36.6 C 36.5 C  SpO2: (P) 100% 100%    Last Pain:  Vitals:   03/20/23 1000  TempSrc:   PainSc: 0-No pain                 Windell Norfolk

## 2023-03-31 ENCOUNTER — Other Ambulatory Visit (HOSPITAL_COMMUNITY): Payer: Self-pay | Admitting: Family Medicine

## 2023-03-31 DIAGNOSIS — E782 Mixed hyperlipidemia: Secondary | ICD-10-CM | POA: Diagnosis not present

## 2023-03-31 DIAGNOSIS — E559 Vitamin D deficiency, unspecified: Secondary | ICD-10-CM | POA: Diagnosis not present

## 2023-03-31 DIAGNOSIS — Z1231 Encounter for screening mammogram for malignant neoplasm of breast: Secondary | ICD-10-CM

## 2023-03-31 DIAGNOSIS — Z0000 Encounter for general adult medical examination without abnormal findings: Secondary | ICD-10-CM | POA: Diagnosis not present

## 2023-03-31 DIAGNOSIS — Z780 Asymptomatic menopausal state: Secondary | ICD-10-CM

## 2023-03-31 DIAGNOSIS — I10 Essential (primary) hypertension: Secondary | ICD-10-CM | POA: Diagnosis not present

## 2023-03-31 DIAGNOSIS — Z79899 Other long term (current) drug therapy: Secondary | ICD-10-CM | POA: Diagnosis not present

## 2023-03-31 DIAGNOSIS — M8580 Other specified disorders of bone density and structure, unspecified site: Secondary | ICD-10-CM | POA: Diagnosis not present

## 2023-03-31 DIAGNOSIS — I700 Atherosclerosis of aorta: Secondary | ICD-10-CM | POA: Diagnosis not present

## 2023-04-08 ENCOUNTER — Other Ambulatory Visit (HOSPITAL_COMMUNITY): Payer: Self-pay

## 2023-04-09 ENCOUNTER — Ambulatory Visit (INDEPENDENT_AMBULATORY_CARE_PROVIDER_SITE_OTHER): Payer: 59 | Admitting: Dermatology

## 2023-04-09 ENCOUNTER — Encounter: Payer: Self-pay | Admitting: Dermatology

## 2023-04-09 VITALS — BP 150/95

## 2023-04-09 DIAGNOSIS — L810 Postinflammatory hyperpigmentation: Secondary | ICD-10-CM

## 2023-04-09 DIAGNOSIS — R238 Other skin changes: Secondary | ICD-10-CM | POA: Diagnosis not present

## 2023-04-09 DIAGNOSIS — L729 Follicular cyst of the skin and subcutaneous tissue, unspecified: Secondary | ICD-10-CM

## 2023-04-09 MED ORDER — CLOBETASOL PROPIONATE 0.05 % EX CREA: 1.0000 | TOPICAL_CREAM | Freq: Two times a day (BID) | CUTANEOUS | 0 refills | Status: AC

## 2023-04-09 NOTE — Progress Notes (Signed)
   New Patient Visit   Subjective  Laura Jimenez is a 72 y.o. female who presents for the following: Rash on the abdomen and breasts x 5 + years. She states she gets blisters. It can be a 5 out 10 for itch. She states she has not received medication for this. She did see Dr. Rocky Link for this in 2022. She states he did not get medication for it. She applies Triple paste cream when they get irritated.    The following portions of the chart were reviewed this encounter and updated as appropriate: medications, allergies, medical history  Review of Systems:  No other skin or systemic complaints except as noted in HPI or Assessment and Plan.  Objective  Well appearing patient in no apparent distress; mood and affect are within normal limits.     A focused examination was performed of the following areas: abdomen  Relevant exam findings are noted in the Assessment and Plan.    Assessment & Plan    1. Recurrent Lumps / blisters     - Assessment: Patient reports a history of recurrent lumps under the skin for the past two years, experiencing occasional pain, tenderness, and drainage. Some relief was noted with the use of a zinc-based diaper rash cream.    - Plan: Advise patient to use Clobetasol cream daily for 2 weeks, followed by a 2-week break. If a new spot appears, the patient is to come in for a biopsy.  2. Post Inflammatory Hyperpigmentation     - Assessment: Patient has scattered hyperpigmented plaques in areas of prior active lesions, reporting itchiness.    - Plan: Advise patient to use Clobetasol cream for itch relief. If itching subsides after two days, the patient may stop using the cream early. The cream can be used for up to two weeks if needed.  3. Follow-Up and Biopsy for New Lesions    - Plan: Instruct patient to send a MyChart message if a new spot appears. An emergency appointment will be scheduled for a biopsy to determine the underlying cause of the recurrent lumps  and inform further treatment plans. Jaclynn Guarneri, CMA, am acting as scribe for [employer], DO.   Documentation: I have reviewed the above documentation for accuracy and completeness, and I agree with the above.  Langston Reusing, DO

## 2023-04-09 NOTE — Patient Instructions (Addendum)
Hello Laura Jimenez,  Thank you for visiting our clinic today. Your dedication to addressing your skin concerns is greatly appreciated, and we are here to support you in your journey towards improved health. Below is a summary of the key instructions from today's consultation:  - Medication: Clobetasol Cream for Itchy Spots   - Application: Apply daily for 2 weeks.   - Pause: Discontinue use for 2 weeks after the initial application period.   - Early Discontinuation: If itching subsides within two days, you may stop using the cream earlier than planned.   - Resumption: Resume use if necessary, for up to two weeks.  - Follow-Up   - Emergency Contact: If a new spot appears, please send a message via MyChart immediately to schedule an emergency appointment for a biopsy.   - Regular Check-Ins: Follow up as needed or if symptoms persist or worsen.  - General Advice   - Zinc-Based Cream: Continue using the zinc-based cream for new lesions, as it has been effective in drying them up.  Your prescription for Clobetasol has been sent to your pharmacy. Should you have any questions or require further assistance, please do not hesitate to reach out through MyChart.  Best regards,  Dr. Langston Reusing Dermatology Important Information  Due to recent changes in healthcare laws, you may see results of your pathology and/or laboratory studies on MyChart before the doctors have had a chance to review them. We understand that in some cases there may be results that are confusing or concerning to you. Please understand that not all results are received at the same time and often the doctors may need to interpret multiple results in order to provide you with the best plan of care or course of treatment. Therefore, we ask that you please give [name] 2 business days to thoroughly review all your results before contacting the office for clarification. Should we see a critical lab result, you will be contacted  sooner.   If You Need Anything After Your Visit  If you have any questions or concerns for your doctor, please call our main line at [phone] If no one answers, please leave a voicemail as directed and we will return your call as soon as possible. Messages left after 4 pm will be answered the following business day.   You may also send [name] a message via MyChart. We typically respond to MyChart messages within 1-2 business days.  For prescription refills, please ask your pharmacy to contact our office. Our fax number is [phone].  If you have an urgent issue when the clinic is closed that cannot wait until the next business day, you can page your doctor at the number below.    Please note that while we do our best to be available for urgent issues outside of office hours, we are not available 24/7.   If you have an urgent issue and are unable to reach [name], you may choose to seek medical care at your doctor's office, retail clinic, urgent care center, or emergency room.  If you have a medical emergency, please immediately call 911 or go to the emergency department. In the event of inclement weather, please call our main line at [phone] for an update on the status of any delays or closures.  Dermatology Medication Tips: Please keep the boxes that topical medications come in in order to help keep track of the instructions about where and how to use these. Pharmacies typically print the medication instructions only on  the boxes and not directly on the medication tubes.   If your medication is too expensive, please contact our office at [phone] or send [name] a message through MyChart.   We are unable to tell what your co-pay for medications will be in advance as this is different depending on your insurance coverage. However, we may be able to find a substitute medication at lower cost or fill out paperwork to get insurance to cover a needed medication.   If a prior authorization is  required to get your medication covered by your insurance company, please allow [name] 1-2 business days to complete this process.  Drug prices often vary depending on where the prescription is filled and some pharmacies may offer cheaper prices.  The website www.goodrx.com contains coupons for medications through different pharmacies. The prices here do not account for what the cost may be with help from insurance (it may be cheaper with your insurance), but the website can give you the price if you did not use any insurance.  - You can print the associated coupon and take it with your prescription to the pharmacy.  - You may also stop by our office during regular business hours and pick up a GoodRx coupon card.  - If you need your prescription sent electronically to a different pharmacy, notify our office through [facility] or by phone at [phone]

## 2023-04-10 ENCOUNTER — Other Ambulatory Visit (HOSPITAL_COMMUNITY): Payer: Self-pay

## 2023-04-10 ENCOUNTER — Other Ambulatory Visit: Payer: Self-pay | Admitting: Pharmacist

## 2023-04-10 DIAGNOSIS — B20 Human immunodeficiency virus [HIV] disease: Secondary | ICD-10-CM

## 2023-04-13 ENCOUNTER — Other Ambulatory Visit (HOSPITAL_COMMUNITY): Payer: Self-pay

## 2023-04-13 NOTE — Progress Notes (Incomplete)
04/13/2023  HPI: Laura BOWLDS is a 72 y.o. female who presents to the [facility] pharmacy clinic for HIV follow-up.  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   ALLOPURINOL (ZYLOPRIM) 100 MG TABLET    Prescription ran out   ASCORBIC ACID (VITAMIN C) 500 MG TABLET    Take 1 tablet by mouth daily.   ASPIRIN (PX ENTERIC ASPIRIN) 81 MG EC TABLET    Take 1 tablet (81 mg total) by mouth daily. Swallow whole.   ASPIRIN-SALICYLAMIDE-CAFFEINE (BC HEADACHE POWDER PO)    Take by mouth.   BICTEGRAVIR-EMTRICITABINE-TENOFOVIR AF (BIKTARVY) 50-200-25 MG TABS TABLET    Take 1 tablet by mouth daily.   BROMPHENIRAMINE-PSEUDOEPHEDRINE-DM 30-2-10 MG/5ML SYRUP    Take 5 mLs by mouth 4 (four) times daily as needed.   CLOBETASOL CREAM (TEMOVATE) 0.05 %    Apply 1  Application topically 2 (two) times daily. Apply to affected areas twice daily for up to 2 weeks for flares   LEVOTHYROXINE (SYNTHROID) 25 MCG TABLET    Take 25 mcg by mouth every morning.   METOPROLOL SUCCINATE (TOPROL-XL) 25 MG 24 HR TABLET    Take 25 mg by mouth at bedtime.   OLMESARTAN (BENICAR) 20 MG TABLET    Take 40 mg by mouth daily.   PANTOPRAZOLE (PROTONIX) 40 MG TABLET    Take 40 mg by mouth daily.   ROSUVASTATIN (CRESTOR) 20 MG TABLET    Take 20 mg by mouth daily.   SQ INJECTION LENACAPAVIR (SUNLENCA) 463.5 MG/1.5ML SQ INJECTION    Inject 3 mLs (927 mg total) into the skin every 6 (six) months. Administer each injection subcutaneously at separate sites in the abdomen (more or equal to 2 inches from the navel).   TETRAHYDROZOLINE 0.05 % OPHTHALMIC SOLUTION    Place 1 drop into both eyes daily as needed (red eyes).   TRIAMTERENE-HYDROCHLOROTHIAZIDE (MAXZIDE-25) 37.5-25 MG TABLET    Take 1 tablet by mouth daily.   VITAMIN D, ERGOCALCIFEROL, (DRISDOL) 1.25 MG (50000 UNIT) CAPS CAPSULE    Take 50,000 Units by mouth every 7 (seven) days.  Modified Medications   No medications on file  Discontinued Medications   No medications on file    Labs: Lab Results  Component Value Date   HIV1RNAQUANT 36 (H) 03/12/2023   HIV1RNAQUANT 530 (H) 02/10/2023   HIV1RNAQUANT 113 (H) 12/22/2022   CD4TABS 283 (L)  02/10/2023   CD4TABS 224 (L) 11/24/2022   CD4TABS 319 (L) 07/31/2021    RPR and STI Lab Results  Component Value Date   LABRPR NON-REACTIVE 02/10/2023   LABRPR NON-REACTIVE 11/24/2022   LABRPR NON-REACTIVE 10/07/2021   LABRPR NON-REACTIVE 07/31/2021   LABRPR NON-REACTIVE 03/25/2021    STI Results GC CT  02/10/2023  2:33 PM Negative  Negative   01/04/2021  1:52 PM Negative  Negative   06/04/2020 10:53 AM Negative  Negative     Hepatitis B Lab Results  Component Value Date   HEPBSAB NON-REACTIVE 06/04/2020   HEPBSAG NON-REACTIVE 06/04/2020   HEPBCAB NON-REACTIVE 06/04/2020    Hepatitis C Lab Results  Component Value Date   HEPCAB NON-REACTIVE 06/04/2020   Hepatitis A Lab Results  Component Value Date   HAV REACTIVE (A) 06/04/2020   Lipids: Lab Results  Component Value Date   CHOL 220 (H) 02/10/2023   TRIG 184 (H) 02/10/2023   HDL 44 (L) 02/10/2023   CHOLHDL 5.0 (H) 02/10/2023   LDLCALC 144 (H) 02/10/2023    Current HIV Regimen: Biktarvy + Sunlenca every 6 months  Assessment: ***  Plan: ***  Lennie Muckle, PharmD PGY1 Pharmacy Resident 04/13/2023 11:29 AM

## 2023-04-13 NOTE — Telephone Encounter (Signed)
Appt 8/20

## 2023-04-14 ENCOUNTER — Ambulatory Visit: Payer: 59 | Admitting: Pharmacist

## 2023-04-15 ENCOUNTER — Other Ambulatory Visit (HOSPITAL_COMMUNITY): Payer: 59

## 2023-04-15 ENCOUNTER — Ambulatory Visit (HOSPITAL_COMMUNITY): Payer: 59

## 2023-04-15 ENCOUNTER — Other Ambulatory Visit (HOSPITAL_COMMUNITY): Payer: Self-pay

## 2023-04-15 NOTE — Telephone Encounter (Signed)
LVM for patient to call to reschedule missed appt on 04/14/23

## 2023-04-16 ENCOUNTER — Other Ambulatory Visit: Payer: Self-pay

## 2023-04-17 ENCOUNTER — Other Ambulatory Visit: Payer: Self-pay

## 2023-04-17 ENCOUNTER — Other Ambulatory Visit (HOSPITAL_COMMUNITY): Payer: Self-pay

## 2023-04-17 MED ORDER — BIKTARVY 50-200-25 MG PO TABS
1.0000 | ORAL_TABLET | Freq: Every day | ORAL | 2 refills | Status: DC
Start: 2023-04-17 — End: 2023-08-28
  Filled 2023-04-17 – 2023-06-08 (×2): qty 30, 30d supply, fill #0
  Filled 2023-06-30: qty 30, 30d supply, fill #1
  Filled 2023-07-16: qty 30, 30d supply, fill #2

## 2023-04-24 NOTE — Progress Notes (Deleted)
Office Visit Note  Patient: Laura Jimenez             Date of Birth: April 22, 1951           MRN: [id number]             PCP: Aliene Beams, MD Referring: Aliene Beams, MD Visit Date: 05/06/2023 Occupation: @GUAROCC @  Subjective:  No chief complaint on file.   History of Present Illness: Laura Jimenez is a 72 y.o. female ***     Activities of Daily Living:  Patient reports morning stiffness for *** {minute/hour:19697}.   Patient {ACTIONS;DENIES/REPORTS:21021675::"Denies"} nocturnal pain.  Difficulty dressing/grooming: {ACTIONS;DENIES/REPORTS:21021675::"Denies"} Difficulty climbing stairs: {ACTIONS;DENIES/REPORTS:21021675::"Denies"} Difficulty getting out of chair: {ACTIONS;DENIES/REPORTS:21021675::"Denies"} Difficulty using hands for taps, buttons, cutlery, and/or writing: {ACTIONS;DENIES/REPORTS:21021675::"Denies"}  No Rheumatology ROS completed.   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   History of CVA (cerebrovascular accident) 11/13/2011   Post-menopausal bleeding 11/13/2011   HIV INFECTION 09/08/2006   CANDIDIASIS, VAGINAL 09/08/2006   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   PERIPHERAL NEUROPATHY 09/08/2006   Essential hypertension 09/08/2006   REFLUX ESOPHAGITIS 09/08/2006   OVARIAN CYST 09/08/2006   DYSPLASIA, CERVIX NOS 09/08/2006   FOLLICULITIS 09/08/2006   Enlarged lymph nodes  09/08/2006   PAP SMEAR, ABNORMAL 10/09/2000    Past Medical History:  Diagnosis Date   Anxiety    Arthritis    Bell's palsy    left eye   Cancer (HCC)    CVA (cerebral infarction)    Eye swelling 07/31/2021   GERD (gastroesophageal reflux disease)    Gout    HIV infection (HCC)    HTN (hypertension)    HTN (hypertension) 02/10/2023   Hyperlipidemia    Morbid obesity with BMI of 40.0-44.9, adult (HCC)    Postmenopausal bleeding     Family History  Problem Relation Age of Onset   Hypertension Mother    Hypertension Sister    Arthritis Sister    Cancer Other    Diabetes Other    Healthy Daughter    Past Surgical History:  Procedure Laterality Date   CATARACT EXTRACTION W/PHACO Left 03/06/2023   Procedure: CATARACT EXTRACTION PHACO AND INTRAOCULAR LENS PLACEMENT (IOC);  Surgeon: Pecolia Ades, MD;  Location: [facility];  Service: Ophthalmology;  Laterality: Left;  CDE 4.01   CATARACT EXTRACTION W/PHACO Right 03/20/2023   Procedure: CATARACT EXTRACTION PHACO AND INTRAOCULAR LENS PLACEMENT (IOC);  Surgeon: Pecolia Ades, MD;  Location: [facility];  Service: Ophthalmology;  Laterality: Right;  CDE 2.61   COLONOSCOPY  2000   HYSTEROSCOPY WITH D & C N/A 04/19/2013   Procedure: DILATATION AND CURETTAGE /HYSTEROSCOPY;  Surgeon: Allie Bossier, MD;  Location: [facility];  Service: Gynecology;  Laterality: N/A;   LYMPH NODE BIOPSY Right 09/17/2020   Procedure: LYMPH NODE BIOPSY; INGUINAL;  Surgeon: Lucretia Roers, MD;  Location: [facility];  Service: General;  Laterality: Right;   Social History   Social History Narrative   Grew  up in [place], [place].   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   2 children, boy and girl. Son is 40s, and special needs.   Daughter in her 22s, is a Engineer, civil (consulting).   Eats all food groups.   Wears seatbelt.   Attends church.   Enjoys watching tv, listening to [employer], and spending time with friends.   Enjoys going out to eat.    Immunization History   Administered Date(s) Administered   Influenza Split 06/08/2010, 05/26/2012, 05/31/2013, 05/29/2014, 05/23/2015   Influenza Whole 05/26/2005   Influenza, High Dose Seasonal PF 05/12/2018, 05/28/2020   Influenza, Quadrivalent, Recombinant, Inj, Pf 09/03/2016, 05/21/2017   Influenza,inj,Quad PF,6+ Mos 09/03/2016   Influenza-Unspecified 06/08/2010, 05/26/2012, 05/31/2013, 05/29/2014, 05/23/2015, 09/03/2016, 05/21/2017, 05/30/2020, 05/22/2021   Moderna Covid-19 Vaccine Bivalent Booster 47yrs & up 06/19/2021   PFIZER(Purple Top)SARS-COV-2 Vaccination 10/08/2019, 11/01/2019, 07/26/2020   PPD Test 06/08/2009   Pneumococcal Conjugate-13 03/20/2014   Pneumococcal Polysaccharide-23 05/31/2013, 04/27/2019   Pneumococcal-Unspecified 05/31/2013, 03/20/2014   Tdap 03/20/2014, 09/25/2017   Zoster, Live 11/21/2020     Objective: Vital Signs: There were no vitals taken for this visit.   Physical Exam   Musculoskeletal Exam: ***  CDAI Exam: CDAI Score: -- Patient Global: --; Provider Global: -- Swollen: --; Tender: -- Joint Exam 05/06/2023   No joint exam has been documented for this visit   There is currently no information documented on the homunculus. Go to the Rheumatology activity and complete the homunculus joint exam.  Investigation: No additional findings.  Imaging: No results found.  Recent Labs: Lab Results  Component Value Date   WBC 4.4 02/10/2023   HGB 11.8 02/10/2023   PLT 183 02/10/2023   NA 140 02/10/2023   K 4.1 02/10/2023   CL 108 02/10/2023   CO2 25 02/10/2023   GLUCOSE 84 02/10/2023   BUN 11 02/10/2023   CREATININE 0.88 02/10/2023   BILITOT 0.5 02/10/2023   ALKPHOS 74 09/11/2021   AST 20 02/10/2023   ALT 13 02/10/2023   PROT 7.8 02/10/2023   ALBUMIN 4.1 09/11/2021   CALCIUM 9.1 02/10/2023   GFRAA 75 01/04/2021   QFTBGOLDPLUS NEGATIVE 06/04/2020    Speciality Comments: No specialty comments available.  Procedures:  No procedures  performed Allergies: Gadolinium derivatives, Amlodipine, Ivp dye [iodinated contrast media], Penicillins, Tape, Cephalexin, and Latex   Assessment / Plan:     Visit Diagnoses: No diagnosis found.  Orders: No orders of the defined types were placed in this encounter.  No orders of the defined types were placed in this encounter.   Face-to-face time spent with patient was *** minutes. Greater than 50% of time was spent in counseling and coordination of care.  Follow-Up Instructions: No follow-ups on file.   Ellen Henri, CMA  Note - This record has been created using Animal nutritionist.  Chart creation errors have been sought, but may not always  have been located. Such creation errors do not reflect on  the standard of medical care.

## 2023-05-04 ENCOUNTER — Other Ambulatory Visit (HOSPITAL_COMMUNITY): Payer: Self-pay

## 2023-05-06 ENCOUNTER — Ambulatory Visit: Payer: 59 | Admitting: Rheumatology

## 2023-05-06 DIAGNOSIS — Z8673 Personal history of transient ischemic attack (TIA), and cerebral infarction without residual deficits: Secondary | ICD-10-CM

## 2023-05-06 DIAGNOSIS — I10 Essential (primary) hypertension: Secondary | ICD-10-CM

## 2023-05-06 DIAGNOSIS — Z8719 Personal history of other diseases of the digestive system: Secondary | ICD-10-CM

## 2023-05-06 DIAGNOSIS — M1A09X0 Idiopathic chronic gout, multiple sites, without tophus (tophi): Secondary | ICD-10-CM

## 2023-05-06 DIAGNOSIS — R21 Rash and other nonspecific skin eruption: Secondary | ICD-10-CM

## 2023-05-06 DIAGNOSIS — F419 Anxiety disorder, unspecified: Secondary | ICD-10-CM

## 2023-05-06 DIAGNOSIS — M79671 Pain in right foot: Secondary | ICD-10-CM

## 2023-05-06 DIAGNOSIS — C519 Malignant neoplasm of vulva, unspecified: Secondary | ICD-10-CM

## 2023-05-06 DIAGNOSIS — D472 Monoclonal gammopathy: Secondary | ICD-10-CM

## 2023-05-06 DIAGNOSIS — E782 Mixed hyperlipidemia: Secondary | ICD-10-CM

## 2023-05-06 DIAGNOSIS — M5136 Other intervertebral disc degeneration, lumbar region: Secondary | ICD-10-CM

## 2023-05-06 DIAGNOSIS — E559 Vitamin D deficiency, unspecified: Secondary | ICD-10-CM

## 2023-05-06 DIAGNOSIS — B20 Human immunodeficiency virus [HIV] disease: Secondary | ICD-10-CM

## 2023-05-06 DIAGNOSIS — E790 Hyperuricemia without signs of inflammatory arthritis and tophaceous disease: Secondary | ICD-10-CM

## 2023-05-06 DIAGNOSIS — M810 Age-related osteoporosis without current pathological fracture: Secondary | ICD-10-CM

## 2023-05-06 DIAGNOSIS — E039 Hypothyroidism, unspecified: Secondary | ICD-10-CM

## 2023-05-06 DIAGNOSIS — G8929 Other chronic pain: Secondary | ICD-10-CM

## 2023-05-06 DIAGNOSIS — R591 Generalized enlarged lymph nodes: Secondary | ICD-10-CM

## 2023-05-07 ENCOUNTER — Other Ambulatory Visit (HOSPITAL_COMMUNITY): Payer: Self-pay

## 2023-05-11 ENCOUNTER — Other Ambulatory Visit (HOSPITAL_COMMUNITY): Payer: Self-pay

## 2023-05-13 ENCOUNTER — Other Ambulatory Visit (HOSPITAL_COMMUNITY): Payer: Self-pay

## 2023-05-20 ENCOUNTER — Other Ambulatory Visit: Payer: Self-pay

## 2023-05-25 ENCOUNTER — Other Ambulatory Visit (HOSPITAL_COMMUNITY): Payer: Self-pay

## 2023-05-25 ENCOUNTER — Other Ambulatory Visit: Payer: Self-pay

## 2023-05-27 ENCOUNTER — Other Ambulatory Visit (HOSPITAL_COMMUNITY): Payer: Self-pay

## 2023-05-27 ENCOUNTER — Other Ambulatory Visit: Payer: Self-pay

## 2023-05-28 ENCOUNTER — Ambulatory Visit: Payer: 59 | Admitting: Rheumatology

## 2023-05-29 ENCOUNTER — Other Ambulatory Visit: Payer: Self-pay

## 2023-06-01 ENCOUNTER — Other Ambulatory Visit (HOSPITAL_COMMUNITY): Payer: Self-pay

## 2023-06-01 ENCOUNTER — Other Ambulatory Visit: Payer: Self-pay

## 2023-06-01 NOTE — Progress Notes (Signed)
Specialty Pharmacy Refill Coordination Note  Laura Jimenez is a 72 y.o. female assessed today regarding refills of clinic administered specialty medication(s) Lenacapavir Sodium   Clinic requested Courier to Provider Office   Delivery date: 06/03/23   Verified address: [facility] [address] [place] [address]   Medication will be filled on 06/02/23.

## 2023-06-02 ENCOUNTER — Other Ambulatory Visit: Payer: Self-pay

## 2023-06-03 ENCOUNTER — Telehealth: Payer: Self-pay

## 2023-06-03 ENCOUNTER — Other Ambulatory Visit (HOSPITAL_COMMUNITY): Payer: Self-pay

## 2023-06-03 NOTE — Telephone Encounter (Signed)
[facility] Patient Advocate Encounter  Patient's medications [facility]) have been couriered to [facility] from [facility] and will be administered at the patients appointment on 09/03/23.  Kae Heller , CPhT Specialty Pharmacy Patient [facility] for Infectious Disease Phone: [phone] Fax:  [phone]

## 2023-06-08 ENCOUNTER — Other Ambulatory Visit: Payer: Self-pay

## 2023-06-09 ENCOUNTER — Telehealth: Payer: Self-pay

## 2023-06-09 NOTE — Telephone Encounter (Signed)
[facility] Patient Advocate Encounter  Patient's medications (BIKTARVY)  have been couriered to [facility] from [facility] and will be picked up at the clinic.  Kae Heller, CPhT Specialty Pharmacy Patient [facility] for Infectious Disease Phone: [phone] Fax:  [phone]

## 2023-06-11 ENCOUNTER — Other Ambulatory Visit: Payer: Self-pay

## 2023-06-11 NOTE — Progress Notes (Signed)
Medication was picked up at [facility] on  06/08/23 @ 3:19pm

## 2023-06-16 ENCOUNTER — Other Ambulatory Visit (HOSPITAL_BASED_OUTPATIENT_CLINIC_OR_DEPARTMENT_OTHER): Payer: Self-pay

## 2023-06-25 ENCOUNTER — Telehealth: Payer: Self-pay | Admitting: Pharmacist

## 2023-06-25 ENCOUNTER — Other Ambulatory Visit: Payer: Self-pay

## 2023-06-25 NOTE — Telephone Encounter (Signed)
Laura Jimenez spoke with [facility] team today as she is due for a Biktarvy refill soon. She told the technician that she is NOT taking Biktarvy anymore because her "thyroid doctor" told her not to. There are NO drug interaction concerns between Biktarvy and Synthroid, and she is have finally achieved undetectable status while taking Biktarvy daily and receiving Sunlenca every six months.   Called patient and reviewed the above points. She states she still wants to speak with Dr. Reece Agar (her rheumatologist) about Synthroid/Biktarvy concerns. She states she has been experiencing depressive episodes, hair loss, and weight changes. Reviewed there are multiple causes for these symptoms and one main concern would be changes in her thyroid levels. Confirmed with her these are NOT side effects of Biktarvy as she has not experienced these symptoms in the past while taking Biktarvy. She still refused to take Biktarvy daily until she spoke with Dr. Reece Agar. Extensively discussed these points and asked her to please call me after she talks with Dr. Reece Agar today. Happy to move her appointment up from January.  Margarite Gouge, PharmD, CPP, BCIDP, AAHIVP Clinical Pharmacist Practitioner Infectious Diseases Clinical Pharmacist [facility] for Infectious Disease

## 2023-06-29 ENCOUNTER — Other Ambulatory Visit: Payer: Self-pay

## 2023-06-30 ENCOUNTER — Other Ambulatory Visit: Payer: Self-pay

## 2023-06-30 ENCOUNTER — Other Ambulatory Visit: Payer: Self-pay | Admitting: Pharmacist

## 2023-06-30 NOTE — Progress Notes (Signed)
Specialty Pharmacy Refill Coordination Note  ELISABET GUTZMER is a 72 y.o. female contacted today regarding refills of specialty medication(s) Bictegravir-Emtricitab-Tenofov   Patient requested Courier to Provider Office   Delivery date: 07/03/23   Verified address: [facility] [address] [place] [address]   Medication will be filled on 07/01/2023.

## 2023-06-30 NOTE — Telephone Encounter (Signed)
Spoke with Marilu today, and she denied missing any Biktarvy doses which is in direct contradiction to her statements last week. She was willing to receive her Biktarvy refill, and I reviewed she is due to see you on 11/18 for follow-up when you can discuss these issues further. States she is also taking her levothyroxine every day and wants to talk to Dr. Reece Agar before "possibly" stopping it. Dr. Reece Agar is supposed to reach out to her this week. Keeping you in the loop - definitely will see what her viral load looks like with these conflicting statements. Marchelle Folks

## 2023-07-02 ENCOUNTER — Telehealth: Payer: Self-pay

## 2023-07-02 ENCOUNTER — Other Ambulatory Visit: Payer: Self-pay

## 2023-07-02 NOTE — Telephone Encounter (Signed)
[facility] Patient Advocate Encounter  Patient's medications BIKTARVY have been couriered to [facility] from [facility].  Kae Heller, CPhT Specialty Pharmacy Patient [facility] for Infectious Disease Phone: [phone] Fax:  [phone]

## 2023-07-09 ENCOUNTER — Telehealth: Payer: Self-pay

## 2023-07-09 DIAGNOSIS — E063 Autoimmune thyroiditis: Secondary | ICD-10-CM | POA: Diagnosis not present

## 2023-07-09 NOTE — Telephone Encounter (Signed)
Medication was picked up at [facility] on  07/09/23 @ 1:09PM

## 2023-07-09 NOTE — Telephone Encounter (Signed)
Yay!

## 2023-07-10 ENCOUNTER — Other Ambulatory Visit: Payer: Self-pay

## 2023-07-10 ENCOUNTER — Other Ambulatory Visit (HOSPITAL_COMMUNITY): Payer: Self-pay

## 2023-07-13 ENCOUNTER — Ambulatory Visit (INDEPENDENT_AMBULATORY_CARE_PROVIDER_SITE_OTHER): Payer: 59 | Admitting: Infectious Disease

## 2023-07-13 ENCOUNTER — Other Ambulatory Visit: Payer: Self-pay

## 2023-07-13 ENCOUNTER — Other Ambulatory Visit (HOSPITAL_COMMUNITY)
Admission: RE | Admit: 2023-07-13 | Discharge: 2023-07-13 | Disposition: A | Discharge status: Home / Self Care | Payer: 59 | Source: Ambulatory Visit | Attending: Infectious Disease | Admitting: Infectious Disease

## 2023-07-13 ENCOUNTER — Encounter: Payer: Self-pay | Admitting: Infectious Disease

## 2023-07-13 VITALS — BP 155/82 | HR 60 | Temp 98.4°F | Ht 64.0 in | Wt 254.0 lb

## 2023-07-13 DIAGNOSIS — Z23 Encounter for immunization: Secondary | ICD-10-CM

## 2023-07-13 DIAGNOSIS — I10 Essential (primary) hypertension: Secondary | ICD-10-CM | POA: Diagnosis not present

## 2023-07-13 DIAGNOSIS — B20 Human immunodeficiency virus [HIV] disease: Secondary | ICD-10-CM | POA: Insufficient documentation

## 2023-07-13 DIAGNOSIS — E782 Mixed hyperlipidemia: Secondary | ICD-10-CM

## 2023-07-13 DIAGNOSIS — M19012 Primary osteoarthritis, left shoulder: Secondary | ICD-10-CM | POA: Diagnosis not present

## 2023-07-13 DIAGNOSIS — E039 Hypothyroidism, unspecified: Secondary | ICD-10-CM | POA: Insufficient documentation

## 2023-07-13 DIAGNOSIS — Z7185 Encounter for immunization safety counseling: Secondary | ICD-10-CM | POA: Diagnosis not present

## 2023-07-13 NOTE — Progress Notes (Signed)
Subjective:  Chief complaint: followup for HIV disease on medications   Patient ID: Laura Jimenez, female    DOB: March 15, 1951, 72 y.o.   MRN: [id number]  HPI  Discussed the use of AI scribe software for clinical note transcription with the patient, who gave verbal consent to proceed.  History of Present Illness   The patient, with a history of HIV and shoulder issues, presents for routine follow-up. . They have been taking Biktarvy for HIV management and have also received an injection of a long-acting medicine. They briefly discontinued Biktarvy due to concerns about weight gain, depression, and hair loss, which they initially attributed to the medication. However, after consultation with their endocrinologist and a pharmacist, they learned that these symptoms were likely related to their thyroid condition and possibly vitamin D was what her endocrinologist was referring to in terms of medicines that could interfere with thyroid medicine not the Biktarvy. They have since resumed taking Biktarvy. She is also on [place] SUNLENCA q 6 months  In addition to their HIV management, the patient is scheduled for a shoulder replacement surgery with Doctor Sharma Covert, an orthopedic surgeon. They have also been dealing with gout, for which they take medication as needed. They have a history of cataracts, for which they underwent surgery in July. They also report taking medication for thyroid issues, blood pressure, and cholesterol.       Past Medical History:  Diagnosis Date   Anxiety    Arthritis    Bell's palsy    left eye   Cancer (HCC)    CVA (cerebral infarction)    Eye swelling 07/31/2021   GERD (gastroesophageal reflux disease)    Gout    HIV infection (HCC)    HTN (hypertension)    HTN (hypertension) 02/10/2023   Hyperlipidemia    Morbid obesity with BMI of 40.0-44.9, adult [facility])    Postmenopausal bleeding     Past Surgical History:  Procedure Laterality Date   CATARACT EXTRACTION W/PHACO  Left 03/06/2023   Procedure: CATARACT EXTRACTION PHACO AND INTRAOCULAR LENS PLACEMENT (IOC);  Surgeon: Pecolia Ades, MD;  Location: [facility];  Service: Ophthalmology;  Laterality: Left;  CDE 4.01   CATARACT EXTRACTION W/PHACO Right 03/20/2023   Procedure: CATARACT EXTRACTION PHACO AND INTRAOCULAR LENS PLACEMENT (IOC);  Surgeon: Pecolia Ades, MD;  Location: [facility];  Service: Ophthalmology;  Laterality: Right;  CDE 2.61   COLONOSCOPY  2000   HYSTEROSCOPY WITH D & C N/A 04/19/2013   Procedure: DILATATION AND CURETTAGE /HYSTEROSCOPY;  Surgeon: Allie Bossier, MD;  Location: [facility];  Service: Gynecology;  Laterality: N/A;   LYMPH NODE BIOPSY Right 09/17/2020   Procedure: LYMPH NODE BIOPSY; INGUINAL;  Surgeon: Lucretia Roers, MD;  Location: [facility];  Service: General;  Laterality: Right;    Family History  Problem Relation Age of Onset   Hypertension Mother    Hypertension Sister    Arthritis Sister    Cancer Other    Diabetes Other    Healthy Daughter       Social History   Socioeconomic History   Marital status: Widowed    Spouse name: Not on file   Number of children: Not on file   Years of education: Not on file   Highest education level: 10th grade  Occupational History   Not on file  Tobacco Use   Smoking status: Former    Current packs/day: 0.00    Average packs/day: 1 pack/day for 5.0 years (5.0 ttl pk-yrs)  Types: Cigarettes    Quit date: 73    Years since quitting: 34.9    Passive exposure: Never   Smokeless tobacco: Never  Vaping Use   Vaping status: Never Used  Substance and Sexual Activity   Alcohol use: No   Drug use: No   Sexual activity: Not Currently    Birth control/protection: None, Post-menopausal  Other Topics Concern   Not on file  Social History Narrative   Grew up in [place], [place].   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   2 children, boy and girl. Son is 40s, and special needs.   Daughter in her 70s, is a Engineer, civil (consulting).   Eats  all food groups.   Wears seatbelt.   Attends church.   Enjoys watching tv, listening to [employer], and spending time with friends.   Enjoys going out to eat.    Social Determinants of Health   Financial Resource Strain: Low Risk  (06/27/2020)   Overall Financial Resource Strain (CARDIA)    Difficulty of Paying Living Expenses: Not hard at all  Food Insecurity: No Food Insecurity (06/27/2020)   Hunger Vital Sign    Worried About Running Out of Food in the Last Year: Never true    Ran Out of Food in the Last Year: Never true  Transportation Needs: No Transportation Needs (06/27/2020)   PRAPARE - Administrator, Civil Service (Medical): No    Lack of Transportation (Non-Medical): No  Physical Activity: Sufficiently Active (06/27/2020)   Exercise Vital Sign    Days of Exercise per Week: 7 days    Minutes of Exercise per Session: 60 min  Stress: Stress Concern Present (07/03/2020)   [employer] of Occupational Health - Occupational Stress Questionnaire    Feeling of Stress : To some extent  Social Connections: Moderately Isolated (06/27/2020)   Social Connection and Isolation Panel [NHANES]    Frequency of Communication with Friends and Family: More than three times a week    Frequency of Social Gatherings with Friends and Family: Once a week    Attends Religious Services: More than 4 times per year    Active Member of [employer] or Organizations: No    Attends Banker Meetings: Never    Marital Status: Widowed    Allergies  Allergen Reactions   Gadolinium Derivatives Nausea And Vomiting    Vomited with multihance 12/05/19 Vomited with multihance 12/05/19   Amlodipine Other (See Comments)   Ivp Dye [Iodinated Contrast Media] Other (See Comments)    Caused kidney damage   Penicillins Swelling   Tape Dermatitis   Cephalexin Rash   Latex Rash     Current Outpatient Medications:    acetaminophen (TYLENOL) 325 MG tablet, as needed., Disp: , Rfl:     ascorbic acid (VITAMIN C) 500 MG tablet, Take 1 tablet by mouth daily., Disp: , Rfl:    aspirin (PX ENTERIC ASPIRIN) 81 MG EC tablet, Take 1 tablet (81 mg total) by mouth daily. Swallow whole., Disp: 30 tablet, Rfl: 12   Aspirin-Salicylamide-Caffeine (BC HEADACHE POWDER PO), Take by mouth., Disp: , Rfl:    bictegravir-emtricitabine-tenofovir AF (BIKTARVY) 50-200-25 MG TABS tablet, Take 1 tablet by mouth daily., Disp: 30 tablet, Rfl: 2   clobetasol cream (TEMOVATE) 0.05 %, Apply 1 Application topically 2 (two) times daily. Apply to affected areas twice daily for up to 2 weeks for flares, Disp: 45 g, Rfl: 0   levothyroxine (SYNTHROID) 25 MCG tablet, Take 25  mcg by mouth every morning., Disp: , Rfl:    olmesartan (BENICAR) 20 MG tablet, Take 40 mg by mouth daily., Disp: , Rfl:    rosuvastatin (CRESTOR) 20 MG tablet, Take 20 mg by mouth daily., Disp: , Rfl:    SQ injection lenacapavir (SUNLENCA) 463.5 MG/1.5ML SQ injection, Inject 3 mLs (927 mg total) into the skin every 6 (six) months. Administer each injection subcutaneously at separate sites in the abdomen (more or equal to 2 inches from the navel)., Disp: 3 mL, Rfl: 2   triamterene-hydrochlorothiazide (MAXZIDE-25) 37.5-25 MG tablet, Take 1 tablet by mouth daily., Disp: , Rfl:    Vitamin D, Ergocalciferol, (DRISDOL) 1.25 MG (50000 UNIT) CAPS capsule, Take 50,000 Units by mouth every 7 (seven) days., Disp: , Rfl:    allopurinol (ZYLOPRIM) 100 MG tablet, Prescription ran out (Patient not taking: Reported on 12/09/2022), Disp: , Rfl:    brompheniramine-pseudoephedrine-DM 30-2-10 MG/5ML syrup, Take 5 mLs by mouth 4 (four) times daily as needed. (Patient not taking: Reported on 07/13/2023), Disp: 140 mL, Rfl: 0   metoprolol succinate (TOPROL-XL) 25 MG 24 hr tablet, Take 25 mg by mouth at bedtime. (Patient not taking: Reported on 12/09/2022), Disp: , Rfl:    pantoprazole (PROTONIX) 40 MG tablet, Take 40 mg by mouth daily. (Patient not taking: Reported on  12/09/2022), Disp: , Rfl:    tetrahydrozoline 0.05 % ophthalmic solution, Place 1 drop into both eyes daily as needed (red eyes). (Patient not taking: Reported on 12/22/2022), Disp: , Rfl:     Review of Systems  Constitutional:  Negative for activity change, appetite change, chills, diaphoresis, fatigue, fever and unexpected weight change.  HENT:  Negative for congestion, rhinorrhea, sinus pressure, sneezing, sore throat and trouble swallowing.   Eyes:  Negative for photophobia and visual disturbance.  Respiratory:  Negative for cough, chest tightness, shortness of breath, wheezing and stridor.   Cardiovascular:  Negative for chest pain, palpitations and leg swelling.  Gastrointestinal:  Negative for abdominal distention, abdominal pain, anal bleeding, blood in stool, constipation, diarrhea, nausea and vomiting.  Genitourinary:  Negative for difficulty urinating, dysuria, flank pain and hematuria.  Musculoskeletal:  Negative for arthralgias, back pain, gait problem, joint swelling and myalgias.  Skin:  Negative for color change, pallor, rash and wound.  Neurological:  Negative for dizziness, tremors, weakness and light-headedness.  Hematological:  Negative for adenopathy. Does not bruise/bleed easily.  Psychiatric/Behavioral:  Negative for agitation, behavioral problems, confusion, decreased concentration, dysphoric mood and sleep disturbance.        Objective:   Physical Exam Constitutional:      General: She is not in acute distress.    Appearance: Normal appearance. She is well-developed. She is not ill-appearing or diaphoretic.  HENT:     Head: Normocephalic and atraumatic.     Right Ear: Hearing and external ear normal.     Left Ear: Hearing and external ear normal.     Nose: No nasal deformity or rhinorrhea.  Eyes:     General: No scleral icterus.    Conjunctiva/sclera: Conjunctivae normal.     Right eye: Right conjunctiva is not injected.     Left eye: Left conjunctiva is not  injected.     Pupils: Pupils are equal, round, and reactive to light.  Neck:     Vascular: No JVD.  Cardiovascular:     Rate and Rhythm: Normal rate and regular rhythm.     Heart sounds: Normal heart sounds, S1 normal and S2 normal. No murmur heard.  No friction rub.  Abdominal:     General: Bowel sounds are normal. There is no distension.     Palpations: Abdomen is soft.     Tenderness: There is no abdominal tenderness.  Musculoskeletal:        General: Normal range of motion.     Right shoulder: Normal.     Left shoulder: Normal.     Cervical back: Normal range of motion and neck supple.     Right hip: Normal.     Left hip: Normal.     Right knee: Normal.     Left knee: Normal.  Lymphadenopathy:     Head:     Right side of head: No submandibular, preauricular or posterior auricular adenopathy.     Left side of head: No submandibular, preauricular or posterior auricular adenopathy.     Cervical: No cervical adenopathy.     Right cervical: No superficial or deep cervical adenopathy.    Left cervical: No superficial or deep cervical adenopathy.  Skin:    General: Skin is warm and dry.     Coloration: Skin is not pale.     Findings: No abrasion, bruising, ecchymosis, erythema, lesion or rash.     Nails: There is no clubbing.  Neurological:     Mental Status: She is alert and oriented to person, place, and time.     Sensory: No sensory deficit.     Coordination: Coordination normal.     Gait: Gait normal.  Psychiatric:        Attention and Perception: She is attentive.        Mood and Affect: Mood normal.        Speech: Speech normal.        Behavior: Behavior normal. Behavior is cooperative.        Thought Content: Thought content normal.        Judgment: Judgment normal.           Assessment & Plan:   Assessment and Plan    HIV On Biktarvy and Lenacapavir. Brief interruption in [place] use due to misunderstanding about side effects. Viral load was less than  50 and CD4 count was almost 300 at last check. -Continue Biktarvy daily. -Check viral load and CD4 count today. -Schedule Lenacapavir injection for January.  Shoulder Replacement Scheduled with Dr. Sharma Covert at [facility]. -No specific plan discussed in this conversation.  Gout On allopurinol and colchicine as needed. -No specific plan discussed in this conversation.  Hypertension and Hyperlipidemia On olmesartan and Crestor. -No specific plan discussed in this conversation.  General Health Maintenance -Administer influenza and COVID-19 vaccines today. -Recommend RSV vaccine at a pharmacy. -Schedule follow-up appointment in January for Lenacapavir injection.      Hypothyroidism: seeing Dr. Benjamine Sprague

## 2023-07-14 LAB — URINE CYTOLOGY ANCILLARY ONLY
Chlamydia: NEGATIVE
Comment: NEGATIVE
Comment: NORMAL
Neisseria Gonorrhea: NEGATIVE

## 2023-07-15 LAB — T-HELPER CELLS (CD4) COUNT (NOT AT ARMC)
CD4 % Helper T Cell: 11 % — ABNORMAL LOW (ref 33–65)
CD4 T Cell Abs: 263 /uL — ABNORMAL LOW (ref 400–1790)

## 2023-07-16 ENCOUNTER — Other Ambulatory Visit: Payer: Self-pay

## 2023-07-16 LAB — LIPID PANEL
Cholesterol: 208 mg/dL — ABNORMAL HIGH (ref <200)
HDL: 48 mg/dL — ABNORMAL LOW (ref >=50)
LDL Cholesterol (Calc): 137 mg/dL — ABNORMAL HIGH
Non-HDL Cholesterol (Calc): 160 mg/dL — ABNORMAL HIGH (ref <130)
Total CHOL/HDL Ratio: 4.3 (calc) (ref <5.0)
Triglycerides: 117 mg/dL (ref <150)

## 2023-07-16 LAB — CBC WITH DIFFERENTIAL/PLATELET
Absolute Lymphocytes: 2398 {cells}/uL (ref 850–3900)
Absolute Monocytes: 361 {cells}/uL (ref 200–950)
Basophils Absolute: 42 {cells}/uL (ref 0–200)
Basophils Relative: 1 %
Eosinophils Absolute: 29 {cells}/uL (ref 15–500)
Eosinophils Relative: 0.7 %
HCT: 38.8 % (ref 35.0–45.0)
Hemoglobin: 12.6 g/dL (ref 11.7–15.5)
MCH: 31 pg (ref 27.0–33.0)
MCHC: 32.5 g/dL (ref 32.0–36.0)
MCV: 95.3 fL (ref 80.0–100.0)
MPV: 10.9 fL (ref 7.5–12.5)
Monocytes Relative: 8.6 %
Neutro Abs: 1369 {cells}/uL — ABNORMAL LOW (ref 1500–7800)
Neutrophils Relative %: 32.6 %
Platelets: 184 10*3/uL (ref 140–400)
RBC: 4.07 10*6/uL (ref 3.80–5.10)
RDW: 12.4 % (ref 11.0–15.0)
Total Lymphocyte: 57.1 %
WBC: 4.2 10*3/uL (ref 3.8–10.8)

## 2023-07-16 LAB — COMPLETE METABOLIC PANEL WITH GFR
AG Ratio: 1 (calc) (ref 1.0–2.5)
ALT: 10 U/L (ref 6–29)
AST: 17 U/L (ref 10–35)
Albumin: 3.8 g/dL (ref 3.6–5.1)
Alkaline phosphatase (APISO): 69 U/L (ref 37–153)
BUN/Creatinine Ratio: 13 (calc) (ref 6–22)
BUN: 15 mg/dL (ref 7–25)
CO2: 25 mmol/L (ref 20–32)
Calcium: 9.5 mg/dL (ref 8.6–10.4)
Chloride: 107 mmol/L (ref 98–110)
Creat: 1.13 mg/dL — ABNORMAL HIGH (ref 0.60–1.00)
Globulin: 3.8 g/dL — ABNORMAL HIGH (ref 1.9–3.7)
Glucose, Bld: 96 mg/dL (ref 65–99)
Potassium: 4.4 mmol/L (ref 3.5–5.3)
Sodium: 138 mmol/L (ref 135–146)
Total Bilirubin: 0.7 mg/dL (ref 0.2–1.2)
Total Protein: 7.6 g/dL (ref 6.1–8.1)
eGFR: 52 mL/min/{1.73_m2} — ABNORMAL LOW (ref >=60)

## 2023-07-16 LAB — HIV RNA, RTPCR W/R GT (RTI, PI,INT)
HIV 1 RNA Quant: <20 {copies}/mL — AB
HIV-1 RNA Quant, Log: <1.3 {Log} — AB

## 2023-07-16 LAB — RPR: RPR Ser Ql: NONREACTIVE

## 2023-07-16 NOTE — Progress Notes (Signed)
Specialty Pharmacy Refill Coordination Note  Laura Jimenez is a 72 y.o. female contacted today regarding refills of specialty medication(s) Bictegravir-Emtricitab-Tenofov   Patient requested Courier to Provider Office   Delivery date: 07/29/23   Verified address: [facility] [address] [place] [address]   Medication will be filled on 07/27/23.

## 2023-07-21 ENCOUNTER — Ambulatory Visit (INDEPENDENT_AMBULATORY_CARE_PROVIDER_SITE_OTHER): Payer: 59 | Admitting: Dermatology

## 2023-07-21 DIAGNOSIS — L0293 Carbuncle, unspecified: Secondary | ICD-10-CM

## 2023-07-21 MED ORDER — MUPIROCIN 2 % EX OINT: 1.0000 | TOPICAL_OINTMENT | Freq: Two times a day (BID) | CUTANEOUS | 0 refills | Status: AC

## 2023-07-21 MED ORDER — DOXYCYCLINE HYCLATE 100 MG PO TABS: 100.0000 mg | ORAL_TABLET | Freq: Two times a day (BID) | ORAL | 1 refills | Status: DC

## 2023-07-21 NOTE — Progress Notes (Signed)
   Follow-Up Visit   Subjective  Laura Jimenez is a 72 y.o. female who presents for the following: evaluation for possible bx of new flare of rash  Ms. Tineke Haseley, presents with a chief complaint of a rash that has developed into what appears to be boils or cysts under the skin. She reports that these lesions began to appear on a Saturday and initially had a food-like appearance with heads. As they grew larger, they became more like a spark without drainage. Eventually, the patient experienced one of the lesions rupturing overnight, releasing a smelly, white discharge.  Ms. Kleve has a history of allergies, including amlodipine, penicillin, cephalexin, latex, tape, and gadolinium dye. She has been using clobetasol for itch relief but reports that it has not been effective in clearing the lesions.     The following portions of the chart were reviewed this encounter and updated as appropriate: medications, allergies, medical history  Review of Systems:  No other skin or systemic complaints except as noted in HPI or Assessment and Plan.  Objective  Well appearing patient in no apparent distress; mood and affect are within normal limits.   A focused examination was performed of the following areas: stomach   Relevant exam findings are noted in the Assessment and Plan.        Assessment & Plan   Carbuncle - Assessment: Patient presents with multiple boil-like lesions that developed recently, described as cysts under the skin. These lesions started with "heads" and progressed to larger, draining lesions with white, malodorous discharge. Clinical presentation is consistent with a carbuncle, which is a group of infected cysts or abscesses. - Plan:   - Prescribe oral antibiotic Doxycycline, to be taken twice daily for 10 days with food.   - Prescribe Mupirocin ointment for topical application to affected areas.   - Clobetasol (previously prescribed) may be used if lesions are  itchy.   - Follow-up as needed.   - Refill provided for antibiotics in case of recurrence.    No follow-ups on file.    Documentation: I have reviewed the above documentation for accuracy and completeness, and I agree with the above.   I, Shirron Marcha Solders, CMA, am acting as scribe for [employer], DO.   Langston Reusing, DO

## 2023-07-21 NOTE — Patient Instructions (Addendum)
Hello Laura Jimenez,  Thank you for visiting our clinic today. We appreciate your dedication to addressing your skin concerns. Here is a summary of the key instructions from today's consultation:  - Medications Prescribed:   - Doxycycline: Take orally twice a day for 10 days with food (big breakfast and big dinner) to avoid an upset stomach.   - Mupirocin Ointment: Apply topically as prescribed to help kill surface bacteria.   - Clobetasol: Use this steroid ointment if the areas become itchy; otherwise, it can be set aside.  - Expected Outcomes:   - You should notice a significant improvement within three days. The affected areas should shrink, dry up, and flatten, though they may leave a small dark spot.  - Additional Notes:   - No biopsy is needed as the diagnosis of carbuncles (inflamed cysts) was confirmed based on appearance and symptoms.   - A refill on the antibiotics has been arranged in case of recurrence.  Please follow the treatment plan as outlined and monitor your symptoms. If you have any questions or if the condition does not improve as expected, please do not hesitate to contact our office.  Wishing you a swift recovery and a wonderful Thanksgiving.  Best regards,  Dr. Langston Reusing Dermatology Important Information  Due to recent changes in healthcare laws, you may see results of your pathology and/or laboratory studies on MyChart before the doctors have had a chance to review them. We understand that in some cases there may be results that are confusing or concerning to you. Please understand that not all results are received at the same time and often the doctors may need to interpret multiple results in order to provide you with the best plan of care or course of treatment. Therefore, we ask that you please give [name] 2 business days to thoroughly review all your results before contacting the office for clarification. Should we see a critical lab result, you will be contacted  sooner.   If You Need Anything After Your Visit  If you have any questions or concerns for your doctor, please call our main line at [phone] If no one answers, please leave a voicemail as directed and we will return your call as soon as possible. Messages left after 4 pm will be answered the following business day.   You may also send [name] a message via MyChart. We typically respond to MyChart messages within 1-2 business days.  For prescription refills, please ask your pharmacy to contact our office. Our fax number is [phone].  If you have an urgent issue when the clinic is closed that cannot wait until the next business day, you can page your doctor at the number below.    Please note that while we do our best to be available for urgent issues outside of office hours, we are not available 24/7.   If you have an urgent issue and are unable to reach [name], you may choose to seek medical care at your doctor's office, retail clinic, urgent care center, or emergency room.  If you have a medical emergency, please immediately call 911 or go to the emergency department. In the event of inclement weather, please call our main line at [phone] for an update on the status of any delays or closures.  Dermatology Medication Tips: Please keep the boxes that topical medications come in in order to help keep track of the instructions about where and how to use these. Pharmacies typically print the medication instructions  only on the boxes and not directly on the medication tubes.   If your medication is too expensive, please contact our office at [phone] or send [name] a message through MyChart.   We are unable to tell what your co-pay for medications will be in advance as this is different depending on your insurance coverage. However, we may be able to find a substitute medication at lower cost or fill out paperwork to get insurance to cover a needed medication.   If a prior authorization is  required to get your medication covered by your insurance company, please allow [name] 1-2 business days to complete this process.  Drug prices often vary depending on where the prescription is filled and some pharmacies may offer cheaper prices.  The website www.goodrx.com contains coupons for medications through different pharmacies. The prices here do not account for what the cost may be with help from insurance (it may be cheaper with your insurance), but the website can give you the price if you did not use any insurance.  - You can print the associated coupon and take it with your prescription to the pharmacy.  - You may also stop by our office during regular business hours and pick up a GoodRx coupon card.  - If you need your prescription sent electronically to a different pharmacy, notify our office through [facility] or by phone at [phone]

## 2023-07-22 ENCOUNTER — Other Ambulatory Visit: Payer: Self-pay

## 2023-07-22 DIAGNOSIS — L0293 Carbuncle, unspecified: Secondary | ICD-10-CM

## 2023-07-22 MED ORDER — DOXYCYCLINE HYCLATE 100 MG PO TABS: ORAL_TABLET | ORAL | 1 refills | Status: DC

## 2023-07-22 NOTE — Progress Notes (Signed)
Med not received

## 2023-07-27 ENCOUNTER — Other Ambulatory Visit: Payer: Self-pay

## 2023-07-28 ENCOUNTER — Other Ambulatory Visit: Payer: Self-pay

## 2023-07-28 ENCOUNTER — Telehealth: Payer: Self-pay

## 2023-07-28 NOTE — Telephone Encounter (Signed)
[facility] Patient Advocate Encounter  Patient's medications BIKTARVY have been couriered to [facility] from [facility] and will be picked up at [facility].  Kae Heller, CPhT Specialty Pharmacy Patient [facility] Phone: [phone] Fax:  [phone]

## 2023-07-29 ENCOUNTER — Other Ambulatory Visit: Payer: Self-pay

## 2023-08-07 ENCOUNTER — Other Ambulatory Visit (HOSPITAL_COMMUNITY): Payer: Self-pay

## 2023-08-07 ENCOUNTER — Other Ambulatory Visit: Payer: Self-pay

## 2023-08-07 NOTE — Progress Notes (Signed)
Specialty Pharmacy Refill Coordination Note  Laura Jimenez is a 72 y.o. female assessed today regarding refills of clinic administered specialty medication(s) Lenacapavir Sodium [facility])   Clinic requested Courier to Provider Office   Delivery date: 08/27/23   Verified address: [address] [place] [address]   Medication will be filled on 08/25/23.

## 2023-08-10 ENCOUNTER — Ambulatory Visit (HOSPITAL_COMMUNITY)
Admission: EM | Admit: 2023-08-10 | Discharge: 2023-08-10 | Disposition: A | Discharge status: Home / Self Care | Payer: 59 | Attending: Physician Assistant | Admitting: Physician Assistant

## 2023-08-10 ENCOUNTER — Encounter (HOSPITAL_COMMUNITY): Payer: Self-pay

## 2023-08-10 ENCOUNTER — Other Ambulatory Visit: Payer: Self-pay

## 2023-08-10 DIAGNOSIS — K5900 Constipation, unspecified: Secondary | ICD-10-CM | POA: Diagnosis not present

## 2023-08-10 DIAGNOSIS — R1011 Right upper quadrant pain: Secondary | ICD-10-CM | POA: Diagnosis not present

## 2023-08-10 LAB — POCT URINALYSIS DIP (MANUAL ENTRY)
Bilirubin, UA: NEGATIVE
Glucose, UA: NEGATIVE mg/dL
Ketones, POC UA: NEGATIVE mg/dL
Leukocytes, UA: NEGATIVE
Nitrite, UA: NEGATIVE
Protein Ur, POC: NEGATIVE mg/dL
Spec Grav, UA: 1.02 (ref 1.010–1.025)
Urobilinogen, UA: 0.2 U/dL
pH, UA: 7 (ref 5.0–8.0)

## 2023-08-10 NOTE — ED Triage Notes (Signed)
Pt c/o constipation all last week. Took stool softener with little relief. C/o RLQ/back pain since Thursday. Pain on movement. Last NBM was Sunday.

## 2023-08-10 NOTE — ED Provider Notes (Signed)
[facility]    CSN: [id number] Arrival date & time: 08/10/23  1629      History   Chief Complaint Chief Complaint  Patient presents with   Abdominal Pain    HPI Laura Jimenez is a 71 y.o. female.   Patient complains of right upper quadrant pain and some mild right-sided back pain that started several days ago.  She reports pain is worse with movement.  She does not feel like the pain gets worse with eating.  She has had some intermittent constipation over the last week or 2.  She reports last bowel movement was yesterday.  She has been taking a stool softener.  She denies nausea, vomiting, fever, chills.    Past Medical History:  Diagnosis Date   Anxiety    Arthritis    Bell's palsy    left eye   Cancer (HCC)    CVA (cerebral infarction)    Eye swelling 07/31/2021   GERD (gastroesophageal reflux disease)    Gout    HIV infection (HCC)    HTN (hypertension)    HTN (hypertension) 02/10/2023   Hyperlipidemia    Morbid obesity with BMI of 40.0-44.9, adult (HCC)    Postmenopausal bleeding     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   History of CVA (cerebrovascular accident) 11/13/2011   Post-menopausal bleeding 11/13/2011   HIV INFECTION 09/08/2006   CANDIDIASIS, VAGINAL 09/08/2006   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   Hereditary and idiopathic peripheral neuropathy 09/08/2006   Essential  hypertension 09/08/2006   REFLUX ESOPHAGITIS 09/08/2006   OVARIAN CYST 09/08/2006   Dysplasia of cervix 09/08/2006   FOLLICULITIS 09/08/2006   Enlarged lymph nodes 09/08/2006   PAP SMEAR, ABNORMAL 10/09/2000    Past Surgical History:  Procedure Laterality Date   CATARACT EXTRACTION W/PHACO Left 03/06/2023   Procedure: CATARACT EXTRACTION PHACO AND INTRAOCULAR LENS PLACEMENT (IOC);  Surgeon: Pecolia Ades, MD;  Location: [facility];  Service: Ophthalmology;  Laterality: Left;  CDE 4.01   CATARACT EXTRACTION W/PHACO Right 03/20/2023   Procedure: CATARACT EXTRACTION PHACO AND INTRAOCULAR LENS PLACEMENT (IOC);  Surgeon: Pecolia Ades, MD;  Location: [facility];  Service: Ophthalmology;  Laterality: Right;  CDE 2.61   COLONOSCOPY  2000   HYSTEROSCOPY WITH D & C N/A 04/19/2013   Procedure: DILATATION AND CURETTAGE /HYSTEROSCOPY;  Surgeon: Allie Bossier, MD;  Location: [facility];  Service: Gynecology;  Laterality: N/A;   LYMPH NODE BIOPSY Right 09/17/2020   Procedure: LYMPH NODE BIOPSY; INGUINAL;  Surgeon: Lucretia Roers, MD;  Location: [facility];  Service: General;  Laterality: Right;    OB History     Gravida  3   Para  2   Term  2   Preterm      AB  1   Living         SAB  1   IAB      Ectopic      Multiple  Live Births               Home Medications    Prior to Admission medications   Medication Sig Start Date End Date Taking? Authorizing Provider  acetaminophen (TYLENOL) 325 MG tablet as needed.    [provider]  allopurinol (ZYLOPRIM) 100 MG tablet Prescription ran out Patient not taking: Reported on 12/09/2022 05/22/21   [provider]  ascorbic acid (VITAMIN C) 500 MG tablet Take 1 tablet by mouth daily.    [provider]  aspirin (PX ENTERIC ASPIRIN) 81 MG EC tablet Take 1 tablet (81 mg total) by mouth daily. Swallow whole. 09/25/17   Aliene Beams, MD  Aspirin-Salicylamide-Caffeine (BC HEADACHE POWDER PO) Take by mouth.     [provider]  bictegravir-emtricitabine-tenofovir AF (BIKTARVY) 50-200-25 MG TABS tablet Take 1 tablet by mouth daily. 04/17/23   Randall Hiss, MD  brompheniramine-pseudoephedrine-DM 30-2-10 MG/5ML syrup Take 5 mLs by mouth 4 (four) times daily as needed. Patient not taking: Reported on 07/13/2023 01/22/23   Leath-Warren, Sadie Haber, NP  clobetasol cream (TEMOVATE) 0.05 % Apply 1 Application topically 2 (two) times daily. Apply to affected areas twice daily for up to 2 weeks for flares 04/09/23   Terri Piedra, DO  levothyroxine (SYNTHROID) 25 MCG tablet Take 25 mcg by mouth every morning. 02/12/21   [provider]  metoprolol succinate (TOPROL-XL) 25 MG 24 hr tablet Take 25 mg by mouth at bedtime. Patient not taking: Reported on 12/09/2022 10/24/20   [provider]  mupirocin ointment (BACTROBAN) 2 % Apply 1 Application topically 2 (two) times daily. 07/21/23   Terri Piedra, DO  olmesartan (BENICAR) 20 MG tablet Take 40 mg by mouth daily.    [provider]  pantoprazole (PROTONIX) 40 MG tablet Take 40 mg by mouth daily. Patient not taking: Reported on 12/09/2022 01/19/20   [provider]  rosuvastatin (CRESTOR) 20 MG tablet Take 20 mg by mouth daily. 01/09/23   [provider]  SQ injection lenacapavir (SUNLENCA) 463.5 MG/1.5ML SQ injection Inject 3 mLs (927 mg total) into the skin every 6 (six) months. Administer each injection subcutaneously at separate sites in the abdomen (more or equal to 2 inches from the navel). 12/08/22   Kuppelweiser, Cassie L, RPH-CPP  tetrahydrozoline 0.05 % ophthalmic solution Place 1 drop into both eyes daily as needed (red eyes). Patient not taking: Reported on 12/22/2022    [provider]  triamterene-hydrochlorothiazide (MAXZIDE-25) 37.5-25 MG tablet Take 1 tablet by mouth daily.    [provider]  Vitamin D, Ergocalciferol, (DRISDOL) 1.25 MG (50000 UNIT) CAPS capsule Take 50,000  Units by mouth every 7 (seven) days.    [provider]    Family History Family History  Problem Relation Age of Onset   Hypertension Mother    Hypertension Sister    Arthritis Sister    Cancer Other    Diabetes Other    Healthy Daughter     Social History Social History   Tobacco Use   Smoking status: Former    Current packs/day: 0.00    Average packs/day: 1 pack/day for 5.0 years (5.0 ttl pk-yrs)    Types: Cigarettes    Quit date: 1990    Years since quitting: 34.9    Passive exposure: Never   Smokeless tobacco: Never  Vaping Use   Vaping status: Never Used  Substance Use Topics   Alcohol use: No   Drug use: No  Allergies   Gadolinium derivatives, Amlodipine, Ivp dye [iodinated contrast media], Penicillins, Tape, Cephalexin, and Latex   Review of Systems Review of Systems  Constitutional:  Negative for chills and fever.  HENT:  Negative for ear pain and sore throat.   Eyes:  Negative for pain and visual disturbance.  Respiratory:  Negative for cough and shortness of breath.   Cardiovascular:  Negative for chest pain and palpitations.  Gastrointestinal:  Positive for abdominal pain and constipation. Negative for nausea and vomiting.  Genitourinary:  Negative for dysuria and hematuria.  Musculoskeletal:  Positive for back pain. Negative for arthralgias.  Skin:  Negative for color change and rash.  Neurological:  Negative for seizures and syncope.  All other systems reviewed and are negative.    Physical Exam Triage Vital Signs ED Triage Vitals  Encounter Vitals Group     BP 08/10/23 1713 (!) 195/94     Systolic BP Percentile --      Diastolic BP Percentile --      Pulse Rate 08/10/23 1713 66     Resp 08/10/23 1713 18     Temp 08/10/23 1713 98.1 F (36.7 C)     Temp Source 08/10/23 1713 Oral     SpO2 08/10/23 1713 98 %     Weight --      Height --      Head Circumference --      Peak Flow --      Pain Score 08/10/23 1714 0     Pain  Loc --      Pain Education --      Exclude from Growth Chart --    No data found.  Updated Vital Signs BP (!) 195/94 (BP Location: Right Arm)   Pulse 66   Temp 98.1 F (36.7 C) (Oral)   Resp 18   SpO2 98%   Visual Acuity Right Eye Distance:   Left Eye Distance:   Bilateral Distance:    Right Eye Near:   Left Eye Near:    Bilateral Near:     Physical Exam Vitals and nursing note reviewed.  Constitutional:      General: She is not in acute distress.    Appearance: She is well-developed.  HENT:     Head: Normocephalic and atraumatic.  Eyes:     Conjunctiva/sclera: Conjunctivae normal.  Cardiovascular:     Rate and Rhythm: Normal rate and regular rhythm.     Heart sounds: No murmur heard. Pulmonary:     Effort: Pulmonary effort is normal. No respiratory distress.     Breath sounds: Normal breath sounds.  Abdominal:     Palpations: Abdomen is soft.     Tenderness: There is abdominal tenderness in the right upper quadrant. There is no right CVA tenderness or left CVA tenderness.  Musculoskeletal:        General: No swelling.     Cervical back: Neck supple.  Skin:    General: Skin is warm and dry.     Capillary Refill: Capillary refill takes less than 2 seconds.  Neurological:     Mental Status: She is alert.  Psychiatric:        Mood and Affect: Mood normal.      UC Treatments / Results  Labs (all labs ordered are listed, but only abnormal results are displayed) Labs Reviewed  POCT URINALYSIS DIP (MANUAL ENTRY) - Abnormal; Notable for the following components:      Result Value   Blood, UA small (*)  All other components within normal limits    EKG   Radiology No results found.  Procedures Procedures (including critical care time)  Medications Ordered in UC Medications - No data to display  Initial Impression / Assessment and Plan / UC Course  I have reviewed the triage vital signs and the nursing notes.  Pertinent labs & imaging results  that were available during my care of the patient were reviewed by me and considered in my medical decision making (see chart for details).     Patient with right upper quadrant pain and some back pain.  Possible muscular pain versus gallbladder.  Is worse with movement.  Denies worsening symptoms after eating.  Vitals within normal limits, no nausea nausea vomiting.  Did not recommend further evaluation in the emergency department today.  ED precautions given.   Final Clinical Impressions(s) / UC Diagnoses   Final diagnoses:  Right upper quadrant abdominal pain  Constipation, unspecified constipation type     Discharge Instructions      Recommend ibuprofen and Tylenol as needed. Continue with stool softener.  If no improvement with constipation can try magnesium citrate. If pain becomes worse or develop fever, chills, nausea, vomiting recommend evaluation in the emergency department or can call primary care physician tomorrow.    ED Prescriptions   None    PDMP not reviewed this encounter.   Ward, Tylene Fantasia, PA-C 08/10/23 [phone]

## 2023-08-10 NOTE — Discharge Instructions (Addendum)
Recommend ibuprofen and Tylenol as needed. Continue with stool softener.  If no improvement with constipation can try magnesium citrate. If pain becomes worse or develop fever, chills, nausea, vomiting recommend evaluation in the emergency department or can call primary care physician tomorrow.

## 2023-08-12 DIAGNOSIS — T148XXA Other injury of unspecified body region, initial encounter: Secondary | ICD-10-CM | POA: Diagnosis not present

## 2023-08-12 DIAGNOSIS — S29011A Strain of muscle and tendon of front wall of thorax, initial encounter: Secondary | ICD-10-CM | POA: Diagnosis not present

## 2023-08-17 ENCOUNTER — Other Ambulatory Visit: Payer: Self-pay

## 2023-08-20 ENCOUNTER — Other Ambulatory Visit (HOSPITAL_COMMUNITY): Payer: Self-pay

## 2023-08-20 ENCOUNTER — Other Ambulatory Visit: Payer: Self-pay

## 2023-08-24 ENCOUNTER — Other Ambulatory Visit: Payer: Self-pay

## 2023-08-25 ENCOUNTER — Other Ambulatory Visit: Payer: Self-pay

## 2023-08-25 ENCOUNTER — Other Ambulatory Visit: Payer: Self-pay | Admitting: Pharmacist

## 2023-08-25 NOTE — Progress Notes (Signed)
 Specialty Pharmacy Ongoing Clinical Assessment Note  Laura Jimenez is a 72 y.o. female who is being followed by the specialty pharmacy service for RxSp HIV   Patient's specialty medication(s) reviewed today: Bictegravir-Emtricitab-Tenofov (Biktarvy ); Lenacapavir Sodium  (SUNLENCA )   Missed doses in the last 4 weeks: 0   Patient/Caregiver did not have any additional questions or concerns.   Therapeutic benefit summary: Patient is achieving benefit   Adverse events/side effects summary: No adverse events/side effects   Patient's therapy is appropriate to: Continue    Goals Addressed             This Visit's Progress    Achieve Undetectable HIV Viral Load < 20       Patient is on track. Patient will work on increased adherence      Comply with lab assessments       Patient is on track. Patient will adhere to provider and/or lab appointments      Increase CD4 count until steady state       Patient is on track. Patient will work on increased adherence      Maintain optimal adherence to therapy       Patient is not on track and improving. Patient will work on increased adherence         Follow up:  6 months  Alan JINNY Geralds Specialty Pharmacist

## 2023-08-28 ENCOUNTER — Other Ambulatory Visit: Payer: Self-pay

## 2023-08-28 ENCOUNTER — Other Ambulatory Visit: Payer: Self-pay | Admitting: Infectious Disease

## 2023-08-28 ENCOUNTER — Other Ambulatory Visit (HOSPITAL_COMMUNITY): Payer: Self-pay

## 2023-08-28 DIAGNOSIS — B20 Human immunodeficiency virus [HIV] disease: Secondary | ICD-10-CM

## 2023-08-28 MED ORDER — BIKTARVY 50-200-25 MG PO TABS
1.0000 | ORAL_TABLET | Freq: Every day | ORAL | 2 refills | Status: DC
  Filled 2023-08-28: qty 30, 30d supply, fill #0
  Filled 2023-09-28: qty 30, 30d supply, fill #1

## 2023-08-28 NOTE — Progress Notes (Signed)
 Specialty Pharmacy Refill Coordination Note  Laura Jimenez is a 73 y.o. female contacted today regarding refills of specialty medication(s) Bictegravir-Emtricitab-Tenofov (Biktarvy )   Patient requested Courier to Provider Office   Delivery date: 09/01/23   Verified address: [facility] [address] [place], [place] [address]   Medication will be filled on 08/31/23.

## 2023-08-31 ENCOUNTER — Telehealth: Payer: Self-pay

## 2023-08-31 NOTE — Telephone Encounter (Signed)
 [facility] Patient Advocate Encounter  Patient's medications BIKTARVY have been couriered to [facility] from [facility] and will be picked up at [facility].  Kae Heller, CPhT Specialty Pharmacy Patient [facility] Phone: [phone] Fax:  [phone]

## 2023-09-03 ENCOUNTER — Telehealth: Payer: Self-pay

## 2023-09-03 ENCOUNTER — Ambulatory Visit: Payer: 59 | Admitting: Pharmacist

## 2023-09-03 NOTE — Progress Notes (Unsigned)
 HPI: Laura Jimenez is a 73 y.o. female who presents to the [facility] pharmacy clinic for HIV follow-up and Sunlenca  administration.  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   History of CVA (cerebrovascular accident) 11/13/2011   Post-menopausal bleeding 11/13/2011   HIV INFECTION 09/08/2006   CANDIDIASIS, VAGINAL 09/08/2006   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   ALLOPURINOL (ZYLOPRIM) 100 MG TABLET    Prescription ran out   ASCORBIC ACID (VITAMIN C) 500 MG TABLET    Take 1 tablet by mouth daily.   ASPIRIN  (PX ENTERIC ASPIRIN ) 81 MG EC TABLET    Take 1 tablet (81 mg total) by mouth daily. Swallow whole.   ASPIRIN -SALICYLAMIDE-CAFFEINE (BC HEADACHE POWDER PO)    Take by mouth.   BICTEGRAVIR-EMTRICITABINE -TENOFOVIR  AF (BIKTARVY ) 50-200-25 MG TABS TABLET    Take 1 tablet by mouth daily.   BROMPHENIRAMINE-PSEUDOEPHEDRINE-DM 30-2-10 MG/5ML SYRUP    Take 5 mLs by mouth 4 (four) times daily as needed.   CLOBETASOL   CREAM (TEMOVATE ) 0.05 %    Apply 1 Application topically 2 (two) times daily. Apply to affected areas twice daily for up to 2 weeks for flares   LEVOTHYROXINE  (SYNTHROID ) 25 MCG TABLET    Take 25 mcg by mouth every morning.   METOPROLOL  SUCCINATE (TOPROL -XL) 25 MG 24 HR TABLET    Take 25 mg by mouth at bedtime.   MUPIROCIN  OINTMENT (BACTROBAN ) 2 %    Apply 1 Application topically 2 (two) times daily.   OLMESARTAN  (BENICAR ) 20 MG TABLET    Take 40 mg by mouth daily.   PANTOPRAZOLE  (PROTONIX ) 40 MG TABLET    Take 40 mg by mouth daily.   ROSUVASTATIN  (CRESTOR ) 20 MG TABLET    Take 20 mg by mouth daily.   SQ INJECTION LENACAPAVIR (SUNLENCA ) 463.5 MG/1.5ML SQ INJECTION    Inject 3 mLs (927 mg total) into the skin every 6 (six) months. Administer each injection subcutaneously at separate sites in the abdomen (more or equal to 2 inches from the navel).   TETRAHYDROZOLINE 0.05 % OPHTHALMIC SOLUTION    Place 1 drop into both eyes daily as needed (red eyes).   TRIAMTERENE -HYDROCHLOROTHIAZIDE  (MAXZIDE -25) 37.5-25 MG TABLET    Take 1 tablet by mouth daily.   VITAMIN D , ERGOCALCIFEROL , (DRISDOL ) 1.25 MG (50000 UNIT) CAPS CAPSULE    Take 50,000 Units by mouth every 7 (seven) days.  Modified Medications   No medications on file  Discontinued Medications   No medications on file    Labs: Lab Results  Component Value Date  HIV1RNAQUANT <20 DETECTED (A) 07/13/2023   HIV1RNAQUANT 36 (H) 03/12/2023   HIV1RNAQUANT 530 (H) 02/10/2023   CD4TABS 263 (L) 07/13/2023   CD4TABS 283 (L) 02/10/2023   CD4TABS 224 (L) 11/24/2022    RPR and STI Lab Results  Component Value Date   LABRPR NON-REACTIVE 07/13/2023   LABRPR NON-REACTIVE 02/10/2023   LABRPR NON-REACTIVE 11/24/2022   LABRPR NON-REACTIVE 10/07/2021   LABRPR NON-REACTIVE 07/31/2021    STI Results GC CT  07/13/2023  1:48 PM Negative  Negative   02/10/2023  2:33 PM Negative  Negative   01/04/2021  1:52 PM Negative  Negative   06/04/2020 10:53 AM  Negative  Negative     Hepatitis B Lab Results  Component Value Date   HEPBSAB NON-REACTIVE 06/04/2020   HEPBSAG NON-REACTIVE 06/04/2020   HEPBCAB NON-REACTIVE 06/04/2020   Hepatitis C Lab Results  Component Value Date   HEPCAB NON-REACTIVE 06/04/2020   Hepatitis A Lab Results  Component Value Date   HAV REACTIVE (A) 06/04/2020   Lipids: Lab Results  Component Value Date   CHOL 208 (H) 07/13/2023   TRIG 117 07/13/2023   HDL 48 (L) 07/13/2023   CHOLHDL 4.3 07/13/2023   LDLCALC 137 (H) 07/13/2023    Current HIV Regimen: Biktarvy   Assessment: Initiated on Sunlenca  on 03/12/2023. First Sunlenca  administered on left side (upper and lower) of abdomen. Was last seen by Dr. Fleeta Rothman on 11/18. On last visit, patient did not report injection issues but reported brief interruption with Biktarvy  use due to her believing there is a significant interaction between Biktarvy  and her Synthroid .   cBMP 11/24 Scr ^1.13, eGFR 52 Lipid profile 11/24: LDL137  CD4 11/24: 263 HIV RNA 11/24: <20  Plan: - Continue Biktarvy  once daily  - RNA. CD4?  - RSV dose? HepB 1/2? - Two Sunlenca  300 mg tablets (600 mg total) given in clinic  Cassie L. Kuppelweiser, PharmD, BCIDP, AAHIVP, CPP Clinical Pharmacist Practitioner Infectious Diseases Clinical Pharmacist [facility] 09/03/2023, 8:07 AM

## 2023-09-03 NOTE — Telephone Encounter (Signed)
 Medication was picked up at [facility] on  09/03/23 @ 12:51PM

## 2023-09-10 ENCOUNTER — Other Ambulatory Visit (HOSPITAL_COMMUNITY): Payer: Self-pay

## 2023-09-15 ENCOUNTER — Other Ambulatory Visit (HOSPITAL_COMMUNITY): Payer: Self-pay

## 2023-09-21 ENCOUNTER — Other Ambulatory Visit (HOSPITAL_COMMUNITY): Payer: Self-pay

## 2023-09-23 ENCOUNTER — Ambulatory Visit: Payer: 59 | Admitting: Pharmacist

## 2023-09-25 ENCOUNTER — Other Ambulatory Visit (HOSPITAL_COMMUNITY): Payer: Self-pay

## 2023-09-25 NOTE — Progress Notes (Deleted)
 HPI: Laura Jimenez is a 73 y.o. female who presents to the [facility] pharmacy clinic for Sunlenca  administration.  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   History of CVA (cerebrovascular accident) 11/13/2011   Post-menopausal bleeding 11/13/2011   HIV INFECTION 09/08/2006   CANDIDIASIS, VAGINAL 09/08/2006   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   ALLOPURINOL (ZYLOPRIM) 100 MG TABLET    Prescription ran out   ASCORBIC ACID (VITAMIN C) 500 MG TABLET    Take 1 tablet by mouth daily.   ASPIRIN  (PX ENTERIC ASPIRIN ) 81 MG EC TABLET    Take 1 tablet (81 mg total) by mouth daily. Swallow whole.   ASPIRIN -SALICYLAMIDE-CAFFEINE (BC HEADACHE POWDER PO)    Take by mouth.   BICTEGRAVIR-EMTRICITABINE -TENOFOVIR  AF (BIKTARVY ) 50-200-25 MG TABS TABLET    Take 1 tablet by mouth daily.   BROMPHENIRAMINE-PSEUDOEPHEDRINE-DM 30-2-10 MG/5ML SYRUP    Take 5 mLs by mouth 4 (four) times daily as needed.   CLOBETASOL  CREAM (TEMOVATE )  0.05 %    Apply 1 Application topically 2 (two) times daily. Apply to affected areas twice daily for up to 2 weeks for flares   LEVOTHYROXINE  (SYNTHROID ) 25 MCG TABLET    Take 25 mcg by mouth every morning.   METOPROLOL  SUCCINATE (TOPROL -XL) 25 MG 24 HR TABLET    Take 25 mg by mouth at bedtime.   MUPIROCIN  OINTMENT (BACTROBAN ) 2 %    Apply 1 Application topically 2 (two) times daily.   OLMESARTAN  (BENICAR ) 20 MG TABLET    Take 40 mg by mouth daily.   PANTOPRAZOLE  (PROTONIX ) 40 MG TABLET    Take 40 mg by mouth daily.   ROSUVASTATIN  (CRESTOR ) 20 MG TABLET    Take 20 mg by mouth daily.   SQ INJECTION LENACAPAVIR (SUNLENCA ) 463.5 MG/1.5ML SQ INJECTION    Inject 3 mLs (927 mg total) into the skin every 6 (six) months. Administer each injection subcutaneously at separate sites in the abdomen (more or equal to 2 inches from the navel).   TETRAHYDROZOLINE 0.05 % OPHTHALMIC SOLUTION    Place 1 drop into both eyes daily as needed (red eyes).   TRIAMTERENE -HYDROCHLOROTHIAZIDE  (MAXZIDE -25) 37.5-25 MG TABLET    Take 1 tablet by mouth daily.   VITAMIN D , ERGOCALCIFEROL , (DRISDOL ) 1.25 MG (50000 UNIT) CAPS CAPSULE    Take 50,000 Units by mouth every 7 (seven) days.  Modified Medications   No medications on file  Discontinued Medications   No medications on file    Allergies: Allergies  Allergen Reactions   Gadolinium Derivatives Nausea And  Vomiting    Vomited with multihance  12/05/19 Vomited with multihance  12/05/19   Amlodipine  Other (See Comments)   Ivp Dye [Iodinated Contrast Media] Other (See Comments)    Caused kidney damage   Penicillins Swelling   Tape Dermatitis   Cephalexin Rash   Latex Rash    Past Medical History: Past Medical History:  Diagnosis Date   Anxiety    Arthritis    Bell's palsy    left eye   Cancer (HCC)    CVA (cerebral infarction)    Eye swelling 07/31/2021   GERD (gastroesophageal reflux disease)    Gout    HIV infection (HCC)    HTN (hypertension)    HTN  (hypertension) 02/10/2023   Hyperlipidemia    Morbid obesity with BMI of 40.0-44.9, adult (HCC)    Postmenopausal bleeding     Social History: Social History   Socioeconomic History   Marital status: Widowed    Spouse name: Not on file   Number of children: Not on file   Years of education: Not on file   Highest education level: 10th grade  Occupational History   Not on file  Tobacco Use   Smoking status: Former    Current packs/day: 0.00    Average packs/day: 1 pack/day for 5.0 years (5.0 ttl pk-yrs)    Types: Cigarettes    Quit date: 26    Years since quitting: 35.1    Passive exposure: Never   Smokeless tobacco: Never  Vaping Use   Vaping status: Never Used  Substance and Sexual Activity   Alcohol use: No   Drug use: No   Sexual activity: Not Currently    Birth control/protection: None, Post-menopausal  Other Topics Concern   Not on file  Social History Narrative   Grew up in [place], [place].   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   2 children, boy and girl. Son is 40s, and special needs.   Daughter in her 49s, is a engineer, civil (consulting).   Eats all food groups.   Wears seatbelt.   Attends church.   Enjoys watching tv, listening to sungard, and spending time with friends.   Enjoys going out to eat.    Social Drivers of Corporate Investment Banker Strain: Low Risk  (06/27/2020)   Overall Financial Resource Strain (CARDIA)    Difficulty of Paying Living Expenses: Not hard at all  Food Insecurity: No Food Insecurity (06/27/2020)   Hunger Vital Sign    Worried About Running Out of Food in the Last Year: Never true    Ran Out of Food in the Last Year: Never true  Transportation Needs: No Transportation Needs (06/27/2020)   PRAPARE - Administrator, Civil Service (Medical): No    Lack of Transportation (Non-Medical): No  Physical Activity: Sufficiently Active (06/27/2020)   Exercise Vital Sign    Days of Exercise per Week: 7 days    Minutes of  Exercise per Session: 60 min  Stress: Stress Concern Present (07/03/2020)   [employer] of Occupational Health - Occupational Stress Questionnaire    Feeling of Stress : To some extent  Social Connections: Moderately Isolated (06/27/2020)   Social Connection and Isolation Panel [NHANES]    Frequency of Communication with Friends and Family: More than three times a week    Frequency of Social Gatherings with Friends and Family: Once a week    Attends Religious Services: More than 4 times per year  Active Member of Clubs or Organizations: No    Attends Banker Meetings: Never    Marital Status: Widowed    Labs: Lab Results  Component Value Date   HIV1RNAQUANT <20 DETECTED (A) 07/13/2023   HIV1RNAQUANT 36 (H) 03/12/2023   HIV1RNAQUANT 530 (H) 02/10/2023   CD4TABS 263 (L) 07/13/2023   CD4TABS 283 (L) 02/10/2023   CD4TABS 224 (L) 11/24/2022    RPR and STI Lab Results  Component Value Date   LABRPR NON-REACTIVE 07/13/2023   LABRPR NON-REACTIVE 02/10/2023   LABRPR NON-REACTIVE 11/24/2022   LABRPR NON-REACTIVE 10/07/2021   LABRPR NON-REACTIVE 07/31/2021    STI Results GC CT  07/13/2023  1:48 PM Negative  Negative   02/10/2023  2:33 PM Negative  Negative   01/04/2021  1:52 PM Negative  Negative   06/04/2020 10:53 AM Negative  Negative     Hepatitis B Lab Results  Component Value Date   HEPBSAB NON-REACTIVE 06/04/2020   HEPBSAG NON-REACTIVE 06/04/2020   HEPBCAB NON-REACTIVE 06/04/2020   Hepatitis C Lab Results  Component Value Date   HEPCAB NON-REACTIVE 06/04/2020   Hepatitis A Lab Results  Component Value Date   HAV REACTIVE (A) 06/04/2020   Lipids: Lab Results  Component Value Date   CHOL 208 (H) 07/13/2023   TRIG 117 07/13/2023   HDL 48 (L) 07/13/2023   CHOLHDL 4.3 07/13/2023   LDLCALC 137 (H) 07/13/2023    Current HIV Regimen: Biktarvy  + Sunlenca   TARGET DATE: The 18th of the month  Assessment: Tida presents today for their  maintenance Sunlenca  injections. Administered lenacapavir 463.5/1.57mL subcutaneously in upper *** abdomen. Administered lenacapavir 463.5/1.72mL subcutaneously in lower *** abdomen more than two inches away from first injection site. Monitored patient for 10 minutes after injection. Injections were tolerated well without issue. Will need a follow up appointment for injections in 6 months.   Has been taking Biktarvy  daily since clarification with possible side effects in November. Picked up her last refill in clinic on 1/9 and also received additional refill today. Will check viral load today and follow up with Dr. Fleeta Rothman in 3-4 months.   Due for hepatitis B vaccination which she declines today.    Plan: - Sunlenca  maintenance injections administered - Continue Biktarvy   - Follow up with Dr. Fleeta Rothman on *** - Schedule Sunlenca  injections in 6 months - Contact for any issues or questions  Alan Geralds, PharmD, CPP, BCIDP, AAHIVP Clinical Pharmacist Practitioner Infectious Diseases Clinical Pharmacist [facility]

## 2023-09-28 ENCOUNTER — Other Ambulatory Visit: Payer: Self-pay

## 2023-09-28 NOTE — Progress Notes (Signed)
Specialty Pharmacy Refill Coordination Note  Laura Jimenez is a 73 y.o. female contacted today regarding refills of specialty medication(s) Bictegravir-Emtricitab-Tenofov Musician)   Patient requested Courier to Provider Office   Delivery date: 09/30/23   Verified address: [facility] [address]   [place] [address]   Medication will be filled on 09/28/23.

## 2023-09-29 ENCOUNTER — Telehealth: Payer: Self-pay

## 2023-09-29 NOTE — Telephone Encounter (Signed)
 [facility] Patient Advocate Encounter  Patient's medications BIKTARVY have been couriered to [facility] from [facility] and will be picked up at [facility].  Kae Heller, CPhT Specialty Pharmacy Patient [facility] Phone: [phone] Fax:  [phone]

## 2023-09-30 ENCOUNTER — Ambulatory Visit: Payer: 59 | Admitting: Pharmacist

## 2023-09-30 DIAGNOSIS — B20 Human immunodeficiency virus [HIV] disease: Secondary | ICD-10-CM

## 2023-10-05 ENCOUNTER — Other Ambulatory Visit: Payer: Self-pay | Admitting: Pharmacist

## 2023-10-05 ENCOUNTER — Other Ambulatory Visit (HOSPITAL_COMMUNITY): Payer: Self-pay

## 2023-10-05 ENCOUNTER — Other Ambulatory Visit: Payer: Self-pay

## 2023-10-05 ENCOUNTER — Ambulatory Visit: Payer: 59 | Admitting: Pharmacist

## 2023-10-05 DIAGNOSIS — B20 Human immunodeficiency virus [HIV] disease: Secondary | ICD-10-CM

## 2023-10-05 MED ORDER — LENACAPAVIR SODIUM 4 X 300 MG PO TBPK
2.0000 | ORAL_TABLET | ORAL | 0 refills | Status: DC
  Filled 2023-10-05: qty 4, 15d supply, fill #0

## 2023-10-06 ENCOUNTER — Other Ambulatory Visit: Payer: Self-pay

## 2023-10-06 ENCOUNTER — Other Ambulatory Visit (HOSPITAL_COMMUNITY): Payer: Self-pay

## 2023-10-07 ENCOUNTER — Telehealth: Payer: Self-pay

## 2023-10-07 NOTE — Telephone Encounter (Signed)
[facility] Patient Advocate Encounter  Patient's medications Sunlenca Tablets have been couriered to [facility] from [facility] and will be administered at the patients appointment on 10/08/23.  Clearance Coots, CPhT Specialty Pharmacy Patient [facility] Phone: [phone] Fax:  [phone]

## 2023-10-08 ENCOUNTER — Ambulatory Visit: Payer: 59 | Admitting: Pharmacist

## 2023-10-08 NOTE — Progress Notes (Incomplete)
HPI: Laura Jimenez is a 73 y.o. female who presents to the [facility] pharmacy clinic for [facility] administration.  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   History of CVA (cerebrovascular accident) 11/13/2011   Post-menopausal bleeding 11/13/2011   HIV INFECTION 09/08/2006   CANDIDIASIS, VAGINAL 09/08/2006   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   ALLOPURINOL (ZYLOPRIM) 100 MG TABLET    Prescription ran out   ASCORBIC ACID (VITAMIN C) 500 MG TABLET    Take 1 tablet by mouth daily.   ASPIRIN (PX ENTERIC ASPIRIN) 81 MG EC TABLET    Take 1 tablet (81 mg total) by mouth daily. Swallow whole.   ASPIRIN-SALICYLAMIDE-CAFFEINE (BC HEADACHE POWDER PO)    Take by mouth.   BICTEGRAVIR-EMTRICITABINE-TENOFOVIR AF (BIKTARVY) 50-200-25 MG TABS TABLET    Take 1 tablet by mouth daily.   BROMPHENIRAMINE-PSEUDOEPHEDRINE-DM 30-2-10 MG/5ML SYRUP    Take 5 mLs by mouth 4 (four) times daily as needed.   CLOBETASOL CREAM (TEMOVATE)  0.05 %    Apply 1 Application topically 2 (two) times daily. Apply to affected areas twice daily for up to 2 weeks for flares   LENACAPAVIR (SUNLENCA) 300 MG TABLET    Take 2 tablets by mouth See admin instructions. This is the 2-day initiation kit: Take two 300-mg tablets on Day 1 & Day 2 with or without food.   LEVOTHYROXINE (SYNTHROID) 25 MCG TABLET    Take 25 mcg by mouth every morning.   METOPROLOL SUCCINATE (TOPROL-XL) 25 MG 24 HR TABLET    Take 25 mg by mouth at bedtime.   MUPIROCIN OINTMENT (BACTROBAN) 2 %    Apply 1 Application topically 2 (two) times daily.   OLMESARTAN (BENICAR) 20 MG TABLET    Take 40 mg by mouth daily.   PANTOPRAZOLE (PROTONIX) 40 MG TABLET    Take 40 mg by mouth daily.   ROSUVASTATIN (CRESTOR) 20 MG TABLET    Take 20 mg by mouth daily.   SQ INJECTION LENACAPAVIR (SUNLENCA) 463.5 MG/1.5ML SQ INJECTION    Inject 3 mLs (927 mg total) into the skin every 6 (six) months. Administer each injection subcutaneously at separate sites in the abdomen (more or equal to 2 inches from the navel).   TETRAHYDROZOLINE 0.05 % OPHTHALMIC SOLUTION    Place 1 drop into both eyes daily as needed (red eyes).   TRIAMTERENE-HYDROCHLOROTHIAZIDE (MAXZIDE-25) 37.5-25 MG TABLET    Take 1 tablet by mouth daily.   VITAMIN D, ERGOCALCIFEROL, (DRISDOL) 1.25 MG (50000 UNIT) CAPS CAPSULE    Take 50,000 Units  by mouth every 7 (seven) days.  Modified Medications   No medications on file  Discontinued Medications   No medications on file    Allergies: Allergies  Allergen Reactions   Gadolinium Derivatives Nausea And Vomiting    Vomited with multihance 12/05/19 Vomited with multihance 12/05/19   Amlodipine Other (See Comments)   Ivp Dye [Iodinated Contrast Media] Other (See Comments)    Caused kidney damage   Penicillins Swelling   Tape Dermatitis   Cephalexin Rash   Latex Rash    Past Medical History: Past Medical History:  Diagnosis Date   Anxiety    Arthritis    Bell's palsy    left  eye   Cancer (HCC)    CVA (cerebral infarction)    Eye swelling 07/31/2021   GERD (gastroesophageal reflux disease)    Gout    HIV infection (HCC)    HTN (hypertension)    HTN (hypertension) 02/10/2023   Hyperlipidemia    Morbid obesity with BMI of 40.0-44.9, adult (HCC)    Postmenopausal bleeding     Social History: Social History   Socioeconomic History   Marital status: Widowed    Spouse name: Not on file   Number of children: Not on file   Years of education: Not on file   Highest education level: 10th grade  Occupational History   Not on file  Tobacco Use   Smoking status: Former    Current packs/day: 0.00    Average packs/day: 1 pack/day for 5.0 years (5.0 ttl pk-yrs)    Types: Cigarettes    Quit date: 61    Years since quitting: 35.1    Passive exposure: Never   Smokeless tobacco: Never  Vaping Use   Vaping status: Never Used  Substance and Sexual Activity   Alcohol use: No   Drug use: No   Sexual activity: Not Currently    Birth control/protection: None, Post-menopausal  Other Topics Concern   Not on file  Social History Narrative   Grew up in [place], [place].   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   2 children, boy and girl. Son is 40s, and special needs.   Daughter in her 59s, is a Engineer, civil (consulting).   Eats all food groups.   Wears seatbelt.   Attends church.   Enjoys watching tv, listening to [employer], and spending time with friends.   Enjoys going out to eat.    Social Drivers of Corporate investment banker Strain: Low Risk  (06/27/2020)   Overall Financial Resource Strain (CARDIA)    Difficulty of Paying Living Expenses: Not hard at all  Food Insecurity: No Food Insecurity (06/27/2020)   Hunger Vital Sign    Worried About Running Out of Food in the Last Year: Never true    Ran Out of Food in the Last Year: Never true  Transportation Needs: No Transportation Needs (06/27/2020)   PRAPARE - Administrator, Civil Service  (Medical): No    Lack of Transportation (Non-Medical): No  Physical Activity: Sufficiently Active (06/27/2020)   Exercise Vital Sign    Days of Exercise per Week: 7 days    Minutes of Exercise per Session: 60 min  Stress: Stress Concern Present (07/03/2020)   [employer] of Occupational Health - Occupational Stress Questionnaire    Feeling of Stress : To some extent  Social Connections: Moderately Isolated (06/27/2020)   Social Connection and Isolation Panel [NHANES]    Frequency of Communication with  Friends and Family: More than three times a week    Frequency of Social Gatherings with Friends and Family: Once a week    Attends Religious Services: More than 4 times per year    Active Member of [employer] or Organizations: No    Attends Banker Meetings: Never    Marital Status: Widowed    Labs: Lab Results  Component Value Date   HIV1RNAQUANT <20 DETECTED (A) 07/13/2023   HIV1RNAQUANT 36 (H) 03/12/2023   HIV1RNAQUANT 530 (H) 02/10/2023   CD4TABS 263 (L) 07/13/2023   CD4TABS 283 (L) 02/10/2023   CD4TABS 224 (L) 11/24/2022    RPR and STI Lab Results  Component Value Date   LABRPR NON-REACTIVE 07/13/2023   LABRPR NON-REACTIVE 02/10/2023   LABRPR NON-REACTIVE 11/24/2022   LABRPR NON-REACTIVE 10/07/2021   LABRPR NON-REACTIVE 07/31/2021    STI Results GC CT  07/13/2023  1:48 PM Negative  Negative   02/10/2023  2:33 PM Negative  Negative   01/04/2021  1:52 PM Negative  Negative   06/04/2020 10:53 AM Negative  Negative     Hepatitis B Lab Results  Component Value Date   HEPBSAB NON-REACTIVE 06/04/2020   HEPBSAG NON-REACTIVE 06/04/2020   HEPBCAB NON-REACTIVE 06/04/2020   Hepatitis C Lab Results  Component Value Date   HEPCAB NON-REACTIVE 06/04/2020   Hepatitis A Lab Results  Component Value Date   HAV REACTIVE (A) 06/04/2020   Lipids: Lab Results  Component Value Date   CHOL 208 (H) 07/13/2023   TRIG 117 07/13/2023   HDL 48 (L) 07/13/2023    CHOLHDL 4.3 07/13/2023   LDLCALC 137 (H) 07/13/2023    Current HIV Regimen: Biktarvy + Sunlenca  TARGET DATE: 18th  Assessment: Laura Jimenez presents today for their maintenance Sunlenca injections. Administered lenacapavir 463.5/1.72mL subcutaneously in upper *** abdomen. Administered lenacapavir 463.5/1.66mL subcutaneously in lower *** abdomen more than two inches away from first injection site. Monitored patient for 10 minutes after injection. Injections were tolerated well without issue. Will need a follow up appointment for injections in 6 months.   Has been taking Biktarvy daily since clarification with possible side effects in November. Picked up her last refill in clinic on 1/9 and also received additional refill today. Will check viral load today and follow up with Dr. Daiva Eves in 3-4 months.   Due for hepatitis B vaccination which she declines today.    Plan: - Sunlenca maintenance injections administered - Continue Biktarvy  - Follow up with Dr. Daiva Eves on *** - Schedule Sunlenca injections in 6 months - Contact for any issues or questions  Stephenie Acres, PharmD PGY1 Pharmacy Resident 10/08/2023 7:44 AM

## 2023-10-12 ENCOUNTER — Ambulatory Visit: Payer: 59 | Admitting: Pharmacist

## 2023-10-19 ENCOUNTER — Other Ambulatory Visit: Payer: Self-pay

## 2023-10-20 ENCOUNTER — Other Ambulatory Visit: Payer: Self-pay

## 2023-10-25 ENCOUNTER — Encounter: Payer: Self-pay | Admitting: Infectious Disease

## 2023-10-25 DIAGNOSIS — Z91199 Patient's noncompliance with other medical treatment and regimen due to unspecified reason: Secondary | ICD-10-CM | POA: Insufficient documentation

## 2023-10-25 HISTORY — DX: Patient's noncompliance with other medical treatment and regimen due to unspecified reason: Z91.199

## 2023-10-26 ENCOUNTER — Other Ambulatory Visit: Payer: Self-pay

## 2023-10-26 ENCOUNTER — Ambulatory Visit (INDEPENDENT_AMBULATORY_CARE_PROVIDER_SITE_OTHER): Payer: 59 | Admitting: Infectious Disease

## 2023-10-26 ENCOUNTER — Other Ambulatory Visit (HOSPITAL_COMMUNITY)
Admission: RE | Admit: 2023-10-26 | Discharge: 2023-10-26 | Disposition: A | Discharge status: Home / Self Care | Source: Ambulatory Visit | Attending: Infectious Disease | Admitting: Infectious Disease

## 2023-10-26 ENCOUNTER — Encounter: Payer: Self-pay | Admitting: Infectious Disease

## 2023-10-26 VITALS — BP 190/107 | HR 67 | Temp 97.6°F | Ht 70.0 in | Wt 251.0 lb

## 2023-10-26 DIAGNOSIS — B20 Human immunodeficiency virus [HIV] disease: Secondary | ICD-10-CM | POA: Insufficient documentation

## 2023-10-26 DIAGNOSIS — R635 Abnormal weight gain: Secondary | ICD-10-CM

## 2023-10-26 DIAGNOSIS — I10 Essential (primary) hypertension: Secondary | ICD-10-CM | POA: Diagnosis not present

## 2023-10-26 DIAGNOSIS — Z91199 Patient's noncompliance with other medical treatment and regimen due to unspecified reason: Secondary | ICD-10-CM | POA: Diagnosis not present

## 2023-10-26 MED ORDER — LENACAPAVIR SODIUM 4 X 300 MG PO TBPK
2.0000 | ORAL_TABLET | ORAL | 0 refills | Status: DC
  Filled 2023-10-26: qty 4, 15d supply, fill #0

## 2023-10-26 MED ORDER — LENACAPAVIR SODIUM 463.5 MG/1.5ML ~~LOC~~ SOLN
927.0000 mg | Freq: Once | SUBCUTANEOUS | Status: AC
  Administered 2023-10-26: 927 mg via SUBCUTANEOUS

## 2023-10-26 NOTE — Progress Notes (Signed)
 Subjective:  Chief complaint: follow-up for HIV disease on Biktarvy and needing Sunlenca injection   Patient ID: Laura Jimenez, female    DOB: 10/08/1950, 73 y.o.   MRN: [id number]  HPI  Discussed the use of AI scribe software for clinical note transcription with the patient, who gave verbal consent to proceed.  History of Present Illness   The patient, with a history of HIV, presents after missing her last dose of Sunlenca, an injectable antiretroviral medication. She initially expressed a desire to discontinue injections, but has since reconsidered and is willing to resume this treatment. She also takes Radio producer, a daily oral antiretroviral medication, which she plans to continue.  The patient reports significant weight gain, which she attributes to an increased appetite. She expresses concern about the impact of this weight gain on her blood pressure. She denies a diagnosis of diabetes, which could have provided access to weight loss medications such as Ozempic.  The patient also mentions a recent bereavement, which may have contributed to her missed medication and disrupted schedule.     Past Medical History:  Diagnosis Date   Anxiety    Arthritis    Bell's palsy    left eye   Cancer (HCC)    CVA (cerebral infarction)    Eye swelling 07/31/2021   GERD (gastroesophageal reflux disease)    Gout    HIV infection (HCC)    HTN (hypertension)    HTN (hypertension) 02/10/2023   Hyperlipidemia    Morbid obesity with BMI of 40.0-44.9, adult (HCC)    Nonadherence to medical treatment 10/25/2023   Postmenopausal bleeding     Past Surgical History:  Procedure Laterality Date   CATARACT EXTRACTION W/PHACO Left 03/06/2023   Procedure: CATARACT EXTRACTION PHACO AND INTRAOCULAR LENS PLACEMENT (IOC);  Surgeon: Pecolia Ades, MD;  Location: [facility];  Service: Ophthalmology;  Laterality: Left;  CDE 4.01   CATARACT EXTRACTION W/PHACO Right 03/20/2023   Procedure: CATARACT EXTRACTION  PHACO AND INTRAOCULAR LENS PLACEMENT (IOC);  Surgeon: Pecolia Ades, MD;  Location: [facility];  Service: Ophthalmology;  Laterality: Right;  CDE 2.61   COLONOSCOPY  2000   HYSTEROSCOPY WITH D & C N/A 04/19/2013   Procedure: DILATATION AND CURETTAGE /HYSTEROSCOPY;  Surgeon: Allie Bossier, MD;  Location: [facility];  Service: Gynecology;  Laterality: N/A;   LYMPH NODE BIOPSY Right 09/17/2020   Procedure: LYMPH NODE BIOPSY; INGUINAL;  Surgeon: Lucretia Roers, MD;  Location: [facility];  Service: General;  Laterality: Right;    Family History  Problem Relation Age of Onset   Hypertension Mother    Hypertension Sister    Arthritis Sister    Cancer Other    Diabetes Other    Healthy Daughter       Social History   Socioeconomic History   Marital status: Widowed    Spouse name: Not on file   Number of children: Not on file   Years of education: Not on file   Highest education level: 10th grade  Occupational History   Not on file  Tobacco Use   Smoking status: Former    Current packs/day: 0.00    Average packs/day: 1 pack/day for 5.0 years (5.0 ttl pk-yrs)    Types: Cigarettes    Quit date: 10    Years since quitting: 35.1    Passive exposure: Never   Smokeless tobacco: Never  Vaping Use   Vaping status: Never Used  Substance and Sexual Activity   Alcohol use: No  Drug use: No   Sexual activity: Not Currently    Birth control/protection: None, Post-menopausal    Comment: declined condoms  Other Topics Concern   Not on file  Social History Narrative   Grew up in [place], [place].   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   2 children, boy and girl. Son is 40s, and special needs.   Daughter in her 52s, is a Engineer, civil (consulting).   Eats all food groups.   Wears seatbelt.   Attends church.   Enjoys watching tv, listening to [employer], and spending time with friends.   Enjoys going out to eat.    Social Drivers of Corporate investment banker Strain: Low Risk  (06/27/2020)    Overall Financial Resource Strain (CARDIA)    Difficulty of Paying Living Expenses: Not hard at all  Food Insecurity: No Food Insecurity (06/27/2020)   Hunger Vital Sign    Worried About Running Out of Food in the Last Year: Never true    Ran Out of Food in the Last Year: Never true  Transportation Needs: No Transportation Needs (06/27/2020)   PRAPARE - Administrator, Civil Service (Medical): No    Lack of Transportation (Non-Medical): No  Physical Activity: Sufficiently Active (06/27/2020)   Exercise Vital Sign    Days of Exercise per Week: 7 days    Minutes of Exercise per Session: 60 min  Stress: Stress Concern Present (07/03/2020)   [employer] of Occupational Health - Occupational Stress Questionnaire    Feeling of Stress : To some extent  Social Connections: Moderately Isolated (06/27/2020)   Social Connection and Isolation Panel [NHANES]    Frequency of Communication with Friends and Family: More than three times a week    Frequency of Social Gatherings with Friends and Family: Once a week    Attends Religious Services: More than 4 times per year    Active Member of [employer] or Organizations: No    Attends Banker Meetings: Never    Marital Status: Widowed    Allergies  Allergen Reactions   Gadolinium Derivatives Nausea And Vomiting    Vomited with multihance 12/05/19 Vomited with multihance 12/05/19   Amlodipine Other (See Comments)   Ivp Dye [Iodinated Contrast Media] Other (See Comments)    Caused kidney damage   Penicillins Swelling and Other (See Comments)   Tape Dermatitis   Cephalexin Rash   Latex Rash     Current Outpatient Medications:    acetaminophen (TYLENOL) 325 MG tablet, as needed., Disp: , Rfl:    allopurinol (ZYLOPRIM) 100 MG tablet, Prescription ran out, Disp: , Rfl:    ascorbic acid (VITAMIN C) 500 MG tablet, Take 1 tablet by mouth daily., Disp: , Rfl:    aspirin (PX ENTERIC ASPIRIN) 81 MG EC tablet, Take 1 tablet (81 mg  total) by mouth daily. Swallow whole., Disp: 30 tablet, Rfl: 12   Aspirin-Salicylamide-Caffeine (BC HEADACHE POWDER PO), Take by mouth., Disp: , Rfl:    bictegravir-emtricitabine-tenofovir AF (BIKTARVY) 50-200-25 MG TABS tablet, Take 1 tablet by mouth daily., Disp: 30 tablet, Rfl: 2   brompheniramine-pseudoephedrine-DM 30-2-10 MG/5ML syrup, Take 5 mLs by mouth 4 (four) times daily as needed., Disp: 140 mL, Rfl: 0   clobetasol cream (TEMOVATE) 0.05 %, Apply 1 Application topically 2 (two) times daily. Apply to affected areas twice daily for up to 2 weeks for flares, Disp: 45 g, Rfl: 0   cloNIDine (CATAPRES) 0.1 MG tablet, Take 0.1 mg  by mouth 2 (two) times daily., Disp: , Rfl:    levothyroxine (SYNTHROID) 25 MCG tablet, Take 25 mcg by mouth every morning., Disp: , Rfl:    metoprolol succinate (TOPROL-XL) 25 MG 24 hr tablet, Take 25 mg by mouth at bedtime., Disp: , Rfl:    mupirocin ointment (BACTROBAN) 2 %, Apply 1 Application topically 2 (two) times daily., Disp: 22 g, Rfl: 0   olmesartan (BENICAR) 20 MG tablet, Take 40 mg by mouth daily., Disp: , Rfl:    pantoprazole (PROTONIX) 40 MG tablet, Take 40 mg by mouth daily., Disp: , Rfl:    rosuvastatin (CRESTOR) 20 MG tablet, Take 20 mg by mouth daily., Disp: , Rfl:    SQ injection lenacapavir (SUNLENCA) 463.5 MG/1.5ML SQ injection, Inject 3 mLs (927 mg total) into the skin every 6 (six) months. Administer each injection subcutaneously at separate sites in the abdomen (more or equal to 2 inches from the navel)., Disp: 3 mL, Rfl: 2   tetrahydrozoline 0.05 % ophthalmic solution, Place 1 drop into both eyes daily as needed (red eyes)., Disp: , Rfl:    triamterene-hydrochlorothiazide (MAXZIDE-25) 37.5-25 MG tablet, Take 1 tablet by mouth daily., Disp: , Rfl:    Vitamin D, Ergocalciferol, (DRISDOL) 1.25 MG (50000 UNIT) CAPS capsule, Take 50,000 Units by mouth every 7 (seven) days., Disp: , Rfl:     Review of Systems  Constitutional:  Positive for  unexpected weight change. Negative for activity change, appetite change, chills, diaphoresis, fatigue and fever.  HENT:  Negative for congestion, rhinorrhea, sinus pressure, sneezing, sore throat and trouble swallowing.   Eyes:  Negative for photophobia and visual disturbance.  Respiratory:  Negative for cough, chest tightness, shortness of breath, wheezing and stridor.   Cardiovascular:  Negative for chest pain, palpitations and leg swelling.  Gastrointestinal:  Negative for abdominal distention, abdominal pain, anal bleeding, blood in stool, constipation, diarrhea, nausea and vomiting.  Genitourinary:  Negative for difficulty urinating, dysuria, flank pain and hematuria.  Musculoskeletal:  Negative for arthralgias, back pain, gait problem, joint swelling and myalgias.  Skin:  Negative for color change, pallor, rash and wound.  Neurological:  Negative for dizziness, tremors, weakness and light-headedness.  Hematological:  Negative for adenopathy. Does not bruise/bleed easily.  Psychiatric/Behavioral:  Negative for agitation, behavioral problems, confusion, decreased concentration, dysphoric mood and sleep disturbance.        Objective:   Physical Exam Constitutional:      General: She is not in acute distress.    Appearance: Normal appearance. She is well-developed. She is not ill-appearing or diaphoretic.  HENT:     Head: Normocephalic and atraumatic.     Right Ear: Hearing and external ear normal.     Left Ear: Hearing and external ear normal.     Nose: No nasal deformity or rhinorrhea.  Eyes:     General: No scleral icterus.    Conjunctiva/sclera: Conjunctivae normal.     Right eye: Right conjunctiva is not injected.     Left eye: Left conjunctiva is not injected.     Pupils: Pupils are equal, round, and reactive to light.  Neck:     Vascular: No JVD.  Cardiovascular:     Rate and Rhythm: Normal rate and regular rhythm.     Heart sounds: Normal heart sounds, S1 normal and S2  normal. No murmur heard.    No friction rub.  Abdominal:     General: Bowel sounds are normal. There is no distension.     Palpations:  Abdomen is soft.     Tenderness: There is no abdominal tenderness.  Musculoskeletal:        General: Normal range of motion.     Right shoulder: Normal.     Left shoulder: Normal.     Cervical back: Normal range of motion and neck supple.     Right hip: Normal.     Left hip: Normal.     Right knee: Normal.     Left knee: Normal.  Lymphadenopathy:     Head:     Right side of head: No submandibular, preauricular or posterior auricular adenopathy.     Left side of head: No submandibular, preauricular or posterior auricular adenopathy.     Cervical: No cervical adenopathy.     Right cervical: No superficial or deep cervical adenopathy.    Left cervical: No superficial or deep cervical adenopathy.  Skin:    General: Skin is warm and dry.     Coloration: Skin is not pale.     Findings: No abrasion, bruising, ecchymosis, erythema, lesion or rash.     Nails: There is no clubbing.  Neurological:     Mental Status: She is alert and oriented to person, place, and time.     Sensory: No sensory deficit.     Coordination: Coordination normal.     Gait: Gait normal.  Psychiatric:        Attention and Perception: She is attentive.        Mood and Affect: Mood normal.        Speech: Speech normal.        Behavior: Behavior normal. Behavior is cooperative.        Thought Content: Thought content normal.        Judgment: Judgment normal.           Assessment & Plan:   Assessment and Plan    HIV  Discussed the importance of consistent medication adherence for viral suppression. -Administer Sunlenca injection today. -she is to take loading dose of Sunlenca orally today and tomorrow -Continue Biktarvy daily. -Check HIV viral load today w reflex to gentoype -Follow-up in 1 month.  Weight Gain Patient reports increased appetite and weight gain,  which may be affecting blood pressure. No known diabetes. -Consider evaluation for weight loss medications if patient has diabetes. -Continue to monitor blood pressure.  Immunizations Up to date on influenza and COVID-19 vaccines. -No further action needed at this time.

## 2023-10-27 ENCOUNTER — Telehealth: Payer: Self-pay | Admitting: Pharmacist

## 2023-10-27 LAB — T-HELPER CELLS (CD4) COUNT (NOT AT ARMC)
CD4 % Helper T Cell: 11 % — ABNORMAL LOW (ref 33–65)
CD4 T Cell Abs: 285 /uL — ABNORMAL LOW (ref 400–1790)

## 2023-10-27 LAB — URINE CYTOLOGY ANCILLARY ONLY
Chlamydia: NEGATIVE
Comment: NEGATIVE
Comment: NORMAL
Neisseria Gonorrhea: NEGATIVE

## 2023-10-27 NOTE — Telephone Encounter (Signed)
 Spoke with Laura Jimenez this morning who confirmed she took her second two tablets of Sunlenca already. Knows to continue taking Biktarvy daily. States her only concern is ongoing weight gain which she would like to talk with Dr. Daiva Eves more about in April.  Margarite Gouge, PharmD, CPP, BCIDP, AAHIVP Clinical Pharmacist Practitioner Infectious Diseases Clinical Pharmacist [facility]

## 2023-10-29 LAB — CBC WITH DIFFERENTIAL/PLATELET
Absolute Lymphocytes: 2963 {cells}/uL (ref 850–3900)
Absolute Monocytes: 479 {cells}/uL (ref 200–950)
Basophils Absolute: 51 {cells}/uL (ref 0–200)
Basophils Relative: 1 %
Eosinophils Absolute: 51 {cells}/uL (ref 15–500)
Eosinophils Relative: 1 %
HCT: 39.7 % (ref 35.0–45.0)
Hemoglobin: 12.8 g/dL (ref 11.7–15.5)
MCH: 31 pg (ref 27.0–33.0)
MCHC: 32.2 g/dL (ref 32.0–36.0)
MCV: 96.1 fL (ref 80.0–100.0)
MPV: 11 fL (ref 7.5–12.5)
Monocytes Relative: 9.4 %
Neutro Abs: 1556 {cells}/uL (ref 1500–7800)
Neutrophils Relative %: 30.5 %
Platelets: 211 10*3/uL (ref 140–400)
RBC: 4.13 10*6/uL (ref 3.80–5.10)
RDW: 12.8 % (ref 11.0–15.0)
Total Lymphocyte: 58.1 %
WBC: 5.1 10*3/uL (ref 3.8–10.8)

## 2023-10-29 LAB — RPR: RPR Ser Ql: NONREACTIVE

## 2023-10-29 LAB — COMPLETE METABOLIC PANEL WITH GFR
AG Ratio: 1.1 (calc) (ref 1.0–2.5)
ALT: 10 U/L (ref 6–29)
AST: 18 U/L (ref 10–35)
Albumin: 4.1 g/dL (ref 3.6–5.1)
Alkaline phosphatase (APISO): 61 U/L (ref 37–153)
BUN: 12 mg/dL (ref 7–25)
CO2: 24 mmol/L (ref 20–32)
Calcium: 9.6 mg/dL (ref 8.6–10.4)
Chloride: 107 mmol/L (ref 98–110)
Creat: 0.86 mg/dL (ref 0.60–1.00)
Globulin: 3.7 g/dL (ref 1.9–3.7)
Glucose, Bld: 97 mg/dL (ref 65–99)
Potassium: 3.8 mmol/L (ref 3.5–5.3)
Sodium: 140 mmol/L (ref 135–146)
Total Bilirubin: 0.6 mg/dL (ref 0.2–1.2)
Total Protein: 7.8 g/dL (ref 6.1–8.1)
eGFR: 73 mL/min/{1.73_m2} (ref >=60)

## 2023-10-29 LAB — HIV RNA, RTPCR W/R GT (RTI, PI,INT)
HIV 1 RNA Quant: 118 {copies}/mL — ABNORMAL HIGH
HIV-1 RNA Quant, Log: 2.07 {Log_copies}/mL — ABNORMAL HIGH

## 2023-11-24 ENCOUNTER — Ambulatory Visit: Payer: Self-pay | Admitting: Infectious Disease

## 2023-12-24 DIAGNOSIS — Z1231 Encounter for screening mammogram for malignant neoplasm of breast: Secondary | ICD-10-CM | POA: Diagnosis not present

## 2024-01-03 NOTE — Progress Notes (Unsigned)
 Subjective:  Chief complaint: follow-up for HIV disease on medications   Patient ID: Laura Jimenez, female    DOB: 05/20/1951, 73 y.o.   MRN: [id number]  HPI  Discussed the use of AI scribe software for clinical note transcription with the patient, who gave verbal consent to proceed.  History of Present Illness   Laura Jimenez is a 73 year old female with HIV who presents with concerns about weight gain and medication side effects.  She attributes her recent weight gain to the Sunlenca   injection received on March 3rd and wishes to discontinue it due symptoms such as difficulty she attributes to this medicine including sleeping, breathing issues at night, and cravings. She is currently on Biktarvy  along with the Sunlenca  that is in her system  for HIV treatment and confirms adherence to this medication,. Her CD4 count is 285. She is concerned about weight gain affecting her back pain and blood pressure.  She also states that  long-standing issue with" boils on her stomach, which have been present since she was 73 years old. Past antibiotic treatments provided relief. She does not currently have a gastroenterologist but is open to a referral.  She experiences persistent nausea, vomiting, and abdominal pain, which have been present even before her current treatment regimen. She has had episodes of vomiting her medications.       Past Medical History:  Diagnosis Date   Anxiety    Arthritis    Bell's palsy    left eye   Cancer (HCC)    CVA (cerebral infarction)    Eye swelling 07/31/2021   GERD (gastroesophageal reflux disease)    Gout    HIV infection (HCC)    HTN (hypertension)    HTN (hypertension) 02/10/2023   Hyperlipidemia    Morbid obesity with BMI of 40.0-44.9, adult (HCC)    Nonadherence to medical treatment 10/25/2023   Postmenopausal bleeding     Past Surgical History:  Procedure Laterality Date   CATARACT EXTRACTION W/PHACO Left 03/06/2023   Procedure:  CATARACT EXTRACTION PHACO AND INTRAOCULAR LENS PLACEMENT (IOC);  Surgeon: Ardeth Krabbe, MD;  Location: [facility];  Service: Ophthalmology;  Laterality: Left;  CDE 4.01   CATARACT EXTRACTION W/PHACO Right 03/20/2023   Procedure: CATARACT EXTRACTION PHACO AND INTRAOCULAR LENS PLACEMENT (IOC);  Surgeon: Ardeth Krabbe, MD;  Location: [facility];  Service: Ophthalmology;  Laterality: Right;  CDE 2.61   COLONOSCOPY  2000   HYSTEROSCOPY WITH D & C N/A 04/19/2013   Procedure: DILATATION AND CURETTAGE /HYSTEROSCOPY;  Surgeon: Ana Balling, MD;  Location: [facility];  Service: Gynecology;  Laterality: N/A;   LYMPH NODE BIOPSY Right 09/17/2020   Procedure: LYMPH NODE BIOPSY; INGUINAL;  Surgeon: Awilda Bogus, MD;  Location: [facility];  Service: General;  Laterality: Right;    Family History  Problem Relation Age of Onset   Hypertension Mother    Hypertension Sister    Arthritis Sister    Cancer Other    Diabetes Other    Healthy Daughter       Social History   Socioeconomic History   Marital status: Widowed    Spouse name: Not on file   Number of children: Not on file   Years of education: Not on file   Highest education level: 10th grade  Occupational History   Not on file  Tobacco Use   Smoking status: Former    Current packs/day: 0.00    Average packs/day: 1 pack/day for 5.0 years (  5.0 ttl pk-yrs)    Types: Cigarettes    Quit date: 1990    Years since quitting: 35.3    Passive exposure: Never   Smokeless tobacco: Never  Vaping Use   Vaping status: Never Used  Substance and Sexual Activity   Alcohol use: No   Drug use: No   Sexual activity: Not Currently    Birth control/protection: None, Post-menopausal    Comment: declined condoms  Other Topics Concern   Not on file  Social History Narrative   Grew up in [place], [place].   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   2 children, boy and girl. Son is 40s, and special needs.   Daughter in her 55s, is a Engineer, civil (consulting).   Eats  all food groups.   Wears seatbelt.   Attends church.   Enjoys watching tv, listening to [employer], and spending time with friends.   Enjoys going out to eat.    Social Drivers of Corporate investment banker Strain: Low Risk  (06/27/2020)   Overall Financial Resource Strain (CARDIA)    Difficulty of Paying Living Expenses: Not hard at all  Food Insecurity: No Food Insecurity (06/27/2020)   Hunger Vital Sign    Worried About Running Out of Food in the Last Year: Never true    Ran Out of Food in the Last Year: Never true  Transportation Needs: No Transportation Needs (06/27/2020)   PRAPARE - Administrator, Civil Service (Medical): No    Lack of Transportation (Non-Medical): No  Physical Activity: Sufficiently Active (06/27/2020)   Exercise Vital Sign    Days of Exercise per Week: 7 days    Minutes of Exercise per Session: 60 min  Stress: Stress Concern Present (07/03/2020)   [employer] of Occupational Health - Occupational Stress Questionnaire    Feeling of Stress : To some extent  Social Connections: Moderately Isolated (06/27/2020)   Social Connection and Isolation Panel [NHANES]    Frequency of Communication with Friends and Family: More than three times a week    Frequency of Social Gatherings with Friends and Family: Once a week    Attends Religious Services: More than 4 times per year    Active Member of [employer] or Organizations: No    Attends Banker Meetings: Never    Marital Status: Widowed    Allergies  Allergen Reactions   Gadolinium Derivatives Nausea And Vomiting    Vomited with multihance  12/05/19 Vomited with multihance  12/05/19   Amlodipine  Other (See Comments)   Ivp Dye [Iodinated Contrast Media] Other (See Comments)    Caused kidney damage   Penicillins Swelling and Other (See Comments)   Tape Dermatitis   Cephalexin Rash   Latex Rash     Current Outpatient Medications:    acetaminophen  (TYLENOL ) 325 MG tablet, as needed.,  Disp: , Rfl:    allopurinol (ZYLOPRIM) 100 MG tablet, Prescription ran out, Disp: , Rfl:    ascorbic acid (VITAMIN C) 500 MG tablet, Take 1 tablet by mouth daily., Disp: , Rfl:    aspirin  (PX ENTERIC ASPIRIN ) 81 MG EC tablet, Take 1 tablet (81 mg total) by mouth daily. Swallow whole., Disp: 30 tablet, Rfl: 12   Aspirin -Salicylamide-Caffeine (BC HEADACHE POWDER PO), Take by mouth., Disp: , Rfl:    bictegravir-emtricitabine -tenofovir  AF (BIKTARVY ) 50-200-25 MG TABS tablet, Take 1 tablet by mouth daily., Disp: 30 tablet, Rfl: 2   brompheniramine-pseudoephedrine-DM 30-2-10 MG/5ML syrup, Take 5 mLs by mouth 4 (  four) times daily as needed., Disp: 140 mL, Rfl: 0   clobetasol  cream (TEMOVATE ) 0.05 %, Apply 1 Application topically 2 (two) times daily. Apply to affected areas twice daily for up to 2 weeks for flares, Disp: 45 g, Rfl: 0   cloNIDine  (CATAPRES ) 0.1 MG tablet, Take 0.1 mg by mouth 2 (two) times daily., Disp: , Rfl:    levothyroxine  (SYNTHROID ) 25 MCG tablet, Take 25 mcg by mouth every morning., Disp: , Rfl:    metoprolol  succinate (TOPROL -XL) 25 MG 24 hr tablet, Take 25 mg by mouth at bedtime., Disp: , Rfl:    mupirocin  ointment (BACTROBAN ) 2 %, Apply 1 Application topically 2 (two) times daily., Disp: 22 g, Rfl: 0   olmesartan  (BENICAR ) 20 MG tablet, Take 40 mg by mouth daily., Disp: , Rfl:    pantoprazole  (PROTONIX ) 40 MG tablet, Take 40 mg by mouth daily., Disp: , Rfl:    rosuvastatin  (CRESTOR ) 20 MG tablet, Take 20 mg by mouth daily., Disp: , Rfl:    SQ injection lenacapavir (SUNLENCA ) 463.5 MG/1.5ML SQ injection, Inject 3 mLs (927 mg total) into the skin every 6 (six) months. Administer each injection subcutaneously at separate sites in the abdomen (more or equal to 2 inches from the navel)., Disp: 3 mL, Rfl: 2   tetrahydrozoline 0.05 % ophthalmic solution, Place 1 drop into both eyes daily as needed (red eyes)., Disp: , Rfl:    triamterene -hydrochlorothiazide  (MAXZIDE -25) 37.5-25 MG tablet,  Take 1 tablet by mouth daily., Disp: , Rfl:    Vitamin D , Ergocalciferol , (DRISDOL ) 1.25 MG (50000 UNIT) CAPS capsule, Take 50,000 Units by mouth every 7 (seven) days., Disp: , Rfl:    Review of Systems  Constitutional:  Positive for unexpected weight change. Negative for activity change, appetite change, chills, diaphoresis, fatigue and fever.  HENT:  Negative for congestion, rhinorrhea, sinus pressure, sneezing, sore throat and trouble swallowing.   Eyes:  Negative for photophobia and visual disturbance.  Respiratory:  Negative for cough, chest tightness, shortness of breath, wheezing and stridor.   Cardiovascular:  Negative for chest pain, palpitations and leg swelling.  Gastrointestinal:  Positive for abdominal distention, abdominal pain and nausea. Negative for anal bleeding, blood in stool, constipation, diarrhea and vomiting.  Genitourinary:  Negative for difficulty urinating, dysuria, flank pain and hematuria.  Musculoskeletal:  Negative for arthralgias, back pain, gait problem, joint swelling and myalgias.  Skin:  Negative for color change, pallor, rash and wound.  Neurological:  Negative for dizziness, tremors, weakness and light-headedness.  Hematological:  Negative for adenopathy. Does not bruise/bleed easily.  Psychiatric/Behavioral:  Negative for agitation, behavioral problems, confusion, decreased concentration, dysphoric mood and sleep disturbance.        Objective:   Physical Exam Constitutional:      General: She is not in acute distress.    Appearance: Normal appearance. She is well-developed. She is not ill-appearing or diaphoretic.  HENT:     Head: Normocephalic and atraumatic.     Right Ear: Hearing and external ear normal.     Left Ear: Hearing and external ear normal.     Nose: No nasal deformity or rhinorrhea.  Eyes:     General: No scleral icterus.    Conjunctiva/sclera: Conjunctivae normal.     Right eye: Right conjunctiva is not injected.     Left eye:  Left conjunctiva is not injected.     Pupils: Pupils are equal, round, and reactive to light.  Neck:     Vascular: No JVD.  Cardiovascular:  Rate and Rhythm: Normal rate and regular rhythm.     Heart sounds: S1 normal and S2 normal.  Pulmonary:     Effort: Pulmonary effort is normal. No respiratory distress.     Breath sounds: No wheezing.  Abdominal:     Palpations: Abdomen is soft.  Musculoskeletal:        General: Normal range of motion.     Right shoulder: Normal.     Left shoulder: Normal.     Cervical back: Normal range of motion and neck supple.     Right hip: Normal.     Left hip: Normal.     Right knee: Normal.     Left knee: Normal.  Lymphadenopathy:     Head:     Right side of head: No submandibular, preauricular or posterior auricular adenopathy.     Left side of head: No submandibular, preauricular or posterior auricular adenopathy.     Cervical: No cervical adenopathy.     Right cervical: No superficial or deep cervical adenopathy.    Left cervical: No superficial or deep cervical adenopathy.  Skin:    General: Skin is warm and dry.     Coloration: Skin is not pale.     Findings: No abrasion, bruising, ecchymosis, erythema, lesion or rash.     Nails: There is no clubbing.  Neurological:     Mental Status: She is alert and oriented to person, place, and time.     Sensory: No sensory deficit.     Coordination: Coordination normal.     Gait: Gait normal.  Psychiatric:        Attention and Perception: She is attentive.        Mood and Affect: Mood is depressed.        Speech: Speech normal.        Behavior: Behavior normal. Behavior is cooperative.        Thought Content: Thought content normal.        Cognition and Memory: Cognition and memory normal.        Judgment: Judgment normal.           Assessment & Plan:   Assessment and Plan    HIV infection HIV managed with Biktarvy  and Sunlenca . CD4 count at 285, MDR virus Limited treatment options  due to resistance. Emphasized importance of current antiretroviral therapy. - Check labs including CD4 count. - Continue Biktarvy  and Sunlenca  will be in her September - Discuss Sunlenca  continuation in August 2 weeks before her next target date and hopefully she will agree to the medication then    Weight gain Weight gain concern due to back pain and hypertension. Potential thyroid  function issue. - Screen for diabetes. - Check thyroid  function.   Hypothyroidism:  Potential thyroid  function issue contributing to weight gain. - Check thyroid  function.   Nausea and vomiting, abdominal pain Chronic nausea and vomiting, history of medication intolerance. No recent gastroenterologist consultation. - Refer to gastroenterologist for further evaluation. \

## 2024-01-04 ENCOUNTER — Other Ambulatory Visit (HOSPITAL_COMMUNITY)
Admission: RE | Admit: 2024-01-04 | Discharge: 2024-01-04 | Disposition: A | Discharge status: Home / Self Care | Source: Ambulatory Visit | Attending: Infectious Disease | Admitting: Infectious Disease

## 2024-01-04 ENCOUNTER — Encounter: Payer: Self-pay | Admitting: Infectious Disease

## 2024-01-04 ENCOUNTER — Ambulatory Visit (INDEPENDENT_AMBULATORY_CARE_PROVIDER_SITE_OTHER): Admitting: Infectious Disease

## 2024-01-04 ENCOUNTER — Other Ambulatory Visit: Payer: Self-pay

## 2024-01-04 VITALS — BP 173/91 | HR 71 | Temp 97.9°F

## 2024-01-04 DIAGNOSIS — R109 Unspecified abdominal pain: Secondary | ICD-10-CM | POA: Diagnosis not present

## 2024-01-04 DIAGNOSIS — B20 Human immunodeficiency virus [HIV] disease: Secondary | ICD-10-CM | POA: Diagnosis present

## 2024-01-04 DIAGNOSIS — E039 Hypothyroidism, unspecified: Secondary | ICD-10-CM | POA: Diagnosis not present

## 2024-01-04 DIAGNOSIS — Z113 Encounter for screening for infections with a predominantly sexual mode of transmission: Secondary | ICD-10-CM

## 2024-01-04 DIAGNOSIS — E1065 Type 1 diabetes mellitus with hyperglycemia: Secondary | ICD-10-CM

## 2024-01-04 DIAGNOSIS — R739 Hyperglycemia, unspecified: Secondary | ICD-10-CM | POA: Diagnosis not present

## 2024-01-04 DIAGNOSIS — R635 Abnormal weight gain: Secondary | ICD-10-CM | POA: Diagnosis not present

## 2024-01-04 DIAGNOSIS — E782 Mixed hyperlipidemia: Secondary | ICD-10-CM | POA: Diagnosis not present

## 2024-01-04 DIAGNOSIS — R112 Nausea with vomiting, unspecified: Secondary | ICD-10-CM

## 2024-01-04 DIAGNOSIS — I10 Essential (primary) hypertension: Secondary | ICD-10-CM

## 2024-01-05 LAB — URINE CYTOLOGY ANCILLARY ONLY
Chlamydia: NEGATIVE
Comment: NEGATIVE
Comment: NORMAL
Neisseria Gonorrhea: NEGATIVE

## 2024-01-05 LAB — T-HELPER CELLS (CD4) COUNT (NOT AT ARMC)
CD4 % Helper T Cell: 12 % — ABNORMAL LOW (ref 33–65)
CD4 T Cell Abs: 286 /uL — ABNORMAL LOW (ref 400–1790)

## 2024-01-07 LAB — CBC WITH DIFFERENTIAL/PLATELET
Absolute Lymphocytes: 2558 {cells}/uL (ref 850–3900)
Absolute Monocytes: 480 {cells}/uL (ref 200–950)
Basophils Absolute: 59 {cells}/uL (ref 0–200)
Basophils Relative: 1.2 %
Eosinophils Absolute: 49 {cells}/uL (ref 15–500)
Eosinophils Relative: 1 %
HCT: 37.7 % (ref 35.0–45.0)
Hemoglobin: 12.4 g/dL (ref 11.7–15.5)
MCH: 30.8 pg (ref 27.0–33.0)
MCHC: 32.9 g/dL (ref 32.0–36.0)
MCV: 93.5 fL (ref 80.0–100.0)
MPV: 10.8 fL (ref 7.5–12.5)
Monocytes Relative: 9.8 %
Neutro Abs: 1754 {cells}/uL (ref 1500–7800)
Neutrophils Relative %: 35.8 %
Platelets: 214 10*3/uL (ref 140–400)
RBC: 4.03 10*6/uL (ref 3.80–5.10)
RDW: 12.4 % (ref 11.0–15.0)
Total Lymphocyte: 52.2 %
WBC: 4.9 10*3/uL (ref 3.8–10.8)

## 2024-01-07 LAB — COMPLETE METABOLIC PANEL WITHOUT GFR
AG Ratio: 1 (calc) (ref 1.0–2.5)
ALT: 10 U/L (ref 6–29)
AST: 17 U/L (ref 10–35)
Albumin: 4 g/dL (ref 3.6–5.1)
Alkaline phosphatase (APISO): 75 U/L (ref 37–153)
BUN: 12 mg/dL (ref 7–25)
CO2: 25 mmol/L (ref 20–32)
Calcium: 9.4 mg/dL (ref 8.6–10.4)
Chloride: 108 mmol/L (ref 98–110)
Creat: 0.87 mg/dL (ref 0.60–1.00)
Globulin: 4 g/dL — ABNORMAL HIGH (ref 1.9–3.7)
Glucose, Bld: 88 mg/dL (ref 65–99)
Potassium: 3.7 mmol/L (ref 3.5–5.3)
Sodium: 140 mmol/L (ref 135–146)
Total Bilirubin: 0.8 mg/dL (ref 0.2–1.2)
Total Protein: 8 g/dL (ref 6.1–8.1)

## 2024-01-07 LAB — RPR: RPR Ser Ql: NONREACTIVE

## 2024-01-07 LAB — HIV RNA, RTPCR W/R GT (RTI, PI,INT)
HIV 1 RNA Quant: <20 {copies}/mL — AB
HIV-1 RNA Quant, Log: <1.3 {Log_copies}/mL — AB

## 2024-01-07 LAB — LIPID PANEL
Cholesterol: 216 mg/dL — ABNORMAL HIGH (ref <200)
HDL: 49 mg/dL — ABNORMAL LOW (ref >=50)
LDL Cholesterol (Calc): 140 mg/dL — ABNORMAL HIGH
Non-HDL Cholesterol (Calc): 167 mg/dL — ABNORMAL HIGH (ref <130)
Total CHOL/HDL Ratio: 4.4 (calc) (ref <5.0)
Triglycerides: 143 mg/dL (ref <150)

## 2024-01-07 LAB — HEMOGLOBIN A1C
Hgb A1c MFr Bld: 5.5 % (ref <5.7)
Mean Plasma Glucose: 111 mg/dL
eAG (mmol/L): 6.2 mmol/L

## 2024-01-13 DIAGNOSIS — M10071 Idiopathic gout, right ankle and foot: Secondary | ICD-10-CM | POA: Diagnosis not present

## 2024-01-13 DIAGNOSIS — E782 Mixed hyperlipidemia: Secondary | ICD-10-CM | POA: Diagnosis not present

## 2024-01-13 DIAGNOSIS — I10 Essential (primary) hypertension: Secondary | ICD-10-CM | POA: Diagnosis not present

## 2024-01-21 DIAGNOSIS — E063 Autoimmune thyroiditis: Secondary | ICD-10-CM | POA: Diagnosis not present

## 2024-01-22 NOTE — Progress Notes (Signed)
 The ASCVD Risk score (Arnett DK, et al., 2019) failed to calculate for the following reasons:   Risk score cannot be calculated because patient has a medical history suggesting prior/existing ASCVD  Laura Jimenez, BSN, RN

## 2024-01-26 ENCOUNTER — Other Ambulatory Visit: Payer: Self-pay

## 2024-02-09 ENCOUNTER — Ambulatory Visit: Admitting: Gastroenterology

## 2024-02-09 ENCOUNTER — Encounter: Payer: Self-pay | Admitting: Gastroenterology

## 2024-02-09 VITALS — BP 146/96 | HR 66 | Ht 70.0 in | Wt 255.4 lb

## 2024-02-09 DIAGNOSIS — R1310 Dysphagia, unspecified: Secondary | ICD-10-CM

## 2024-02-09 DIAGNOSIS — Z791 Long term (current) use of non-steroidal anti-inflammatories (NSAID): Secondary | ICD-10-CM | POA: Diagnosis not present

## 2024-02-09 DIAGNOSIS — R933 Abnormal findings on diagnostic imaging of other parts of digestive tract: Secondary | ICD-10-CM | POA: Diagnosis not present

## 2024-02-09 DIAGNOSIS — K5900 Constipation, unspecified: Secondary | ICD-10-CM

## 2024-02-09 DIAGNOSIS — R1013 Epigastric pain: Secondary | ICD-10-CM

## 2024-02-09 DIAGNOSIS — R1030 Lower abdominal pain, unspecified: Secondary | ICD-10-CM | POA: Diagnosis not present

## 2024-02-09 MED ORDER — POLYETHYLENE GLYCOL 3350 17 G PO PACK: 17.0000 g | PACK | Freq: Every day | ORAL | Status: DC

## 2024-02-09 MED ORDER — PANTOPRAZOLE SODIUM 40 MG PO TBEC: 40.0000 mg | DELAYED_RELEASE_TABLET | Freq: Every day | ORAL | 1 refills | Status: AC

## 2024-02-09 NOTE — Progress Notes (Signed)
 HPI :  73 year old female with HIV on chronic therapy with undetectable viral load, diabetes, hypertension, esophageal stricture, here to establish her care with us  for abdominal pain, dysphagia.  PCP is Dorena Gander MD.  She presents today with symptoms of lower abdominal pain, epigastric pain, occasional nausea vomiting, dysphagia.  She has been having intermittent abdominal pains for years.  She has lower/periumbilical pain that can bother her intermittently.  This can happen for a few days at a time, perhaps weekly.  Pain lasts for just a few minutes and then resolves.  She feels that constipation can trigger her symptoms.  She has a bowel movement often every day but sometimes has hard stools and strains, has a hard time moving her bowels.  She is taking a stool softener which does not always resolve this.  She endorses a rash she has had intermittently on her abdominal wall for which she sees dermatology and was applied some ointment, I do not see any records of that on file. Last cross-sectional imaging of her abdomen was in January 2016, she had changes concerning for enteritis with inflammation in the mesentery as well as lymphadenopathy in that area.  She has not had any follow-up imaging.    She also has complaints of epigastric pain.  This can occasionally be postprandial.  This can also come and go.  She has nausea and vomiting at times, vomiting can make her stomach feel better.  She takes BC powder twice daily for shoulder pain.  She states she is awaiting shoulder surgery and has been using this frequently.  She also takes a baby aspirin  daily.  On her medication history Protonix  is listed to take daily but she states she is unaware of this and has not been taking that.  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  She does have dysphagia to solids that bothers her periodically.  She last had an EGD with [facility]  in 2019 with balloon dilation of the stricture and she states that provided relief at the time.  Her symptoms have recurred over time.  She also had a colonoscopy in 2019 which was normal   She denies any cardiopulmonary symptoms.  She denies any tobacco use currently.   Prior workup on file:  Colonoscopy 02/03/2018: [facility] - good prep, normal, no polyps  EGD 02/03/2018 [facility] - 10mm distal esophageal stricture, balloon dilation to 12mm  Last imaging in chart CT abdomen / pelvis 09/20/2014 -  1. Stranding of the small bowel mesentery in the central and lower abdomen. This finding is nonspecific and may reflect enteritis.  2. There are multiple mesenteric nodes, similar to prior exam. These may be related to the patient's underlying HIV or other inflammatory/infectious process.  3. Moderate volume of abdominopelvic ascites.  4. Evaluation of the vasculature is markedly limited due to lack of intravenous contrast.  5. Additional ancillary findings as described above.   Past Medical History:  Diagnosis Date   Anxiety    Arthritis    Bell's palsy    left eye   Cancer (HCC)    CVA (cerebral infarction)    Eye swelling 07/31/2021   GERD (gastroesophageal reflux disease)    Gout    HIV infection (HCC)    HTN (hypertension) 02/10/2023   Hyperlipidemia    Hypothyroidism due to Hashimoto's thyroiditis    Morbid obesity with BMI of 40.0-44.9,  adult [facility])    Nonadherence to medical treatment 10/25/2023   Postmenopausal bleeding    Type 1 diabetes mellitus with hyperglycemia [facility])      Past Surgical History:  Procedure Laterality Date   CATARACT EXTRACTION W/PHACO Left 03/06/2023   Procedure: CATARACT EXTRACTION PHACO AND INTRAOCULAR LENS PLACEMENT (IOC);  Surgeon: Ardeth Krabbe, MD;  Location: [facility];  Service: Ophthalmology;  Laterality: Left;  CDE 4.01   CATARACT EXTRACTION W/PHACO Right 03/20/2023   Procedure: CATARACT EXTRACTION PHACO AND INTRAOCULAR LENS PLACEMENT  (IOC);  Surgeon: Ardeth Krabbe, MD;  Location: [facility];  Service: Ophthalmology;  Laterality: Right;  CDE 2.61   COLONOSCOPY  2000   HYSTEROSCOPY WITH D & C N/A 04/19/2013   Procedure: DILATATION AND CURETTAGE /HYSTEROSCOPY;  Surgeon: Ana Balling, MD;  Location: [facility];  Service: Gynecology;  Laterality: N/A;   LYMPH NODE BIOPSY Right 09/17/2020   Procedure: LYMPH NODE BIOPSY; INGUINAL;  Surgeon: Awilda Bogus, MD;  Location: [facility];  Service: General;  Laterality: Right;   Family History  Problem Relation Age of Onset   Hypertension Mother    Hypertension Sister    Arthritis Sister    Cancer Other    Diabetes Other    Healthy Daughter    Social History   Tobacco Use   Smoking status: Former    Current packs/day: 0.00    Average packs/day: 1 pack/day for 5.0 years (5.0 ttl pk-yrs)    Types: Cigarettes    Quit date: 1990    Years since quitting: 35.4    Passive exposure: Never   Smokeless tobacco: Never  Vaping Use   Vaping status: Never Used  Substance Use Topics   Alcohol use: No   Drug use: No   Current Outpatient Medications  Medication Sig Dispense Refill   acetaminophen  (TYLENOL ) 325 MG tablet as needed.     ascorbic acid (VITAMIN C) 500 MG tablet Take 1 tablet by mouth daily.     aspirin  (PX ENTERIC ASPIRIN ) 81 MG EC tablet Take 1 tablet (81 mg total) by mouth daily. Swallow whole. 30 tablet 12   Aspirin -Salicylamide-Caffeine (BC HEADACHE POWDER PO) Take by mouth.     bictegravir-emtricitabine -tenofovir  AF (BIKTARVY ) 50-200-25 MG TABS tablet Take 1 tablet by mouth daily. 30 tablet 2   clobetasol  cream (TEMOVATE ) 0.05 % Apply 1 Application topically 2 (two) times daily. Apply to affected areas twice daily for up to 2 weeks for flares 45 g 0   cloNIDine  (CATAPRES ) 0.1 MG tablet Take 0.1 mg by mouth 2 (two) times daily.     levothyroxine  (SYNTHROID ) 25 MCG tablet Take 25 mcg by mouth every morning.     metoprolol  succinate (TOPROL -XL) 25 MG 24 hr tablet Take 25 mg  by mouth at bedtime.     mupirocin  ointment (BACTROBAN ) 2 % Apply 1 Application topically 2 (two) times daily. 22 g 0   olmesartan  (BENICAR ) 20 MG tablet Take 40 mg by mouth daily.     pantoprazole  (PROTONIX ) 40 MG tablet Take 40 mg by mouth daily.     rosuvastatin  (CRESTOR ) 20 MG tablet Take 20 mg by mouth daily.     triamterene -hydrochlorothiazide  (MAXZIDE -25) 37.5-25 MG tablet Take 1 tablet by mouth daily.     allopurinol (ZYLOPRIM) 100 MG tablet Prescription ran out (Patient not taking: Reported on 02/09/2024)     brompheniramine-pseudoephedrine-DM 30-2-10 MG/5ML syrup Take 5 mLs by mouth 4 (four) times daily as needed. (Patient not taking: Reported on 02/09/2024) 140 mL 0  SQ injection lenacapavir (SUNLENCA ) 463.5 MG/1.5ML SQ injection Inject 3 mLs (927 mg total) into the skin every 6 (six) months. Administer each injection subcutaneously at separate sites in the abdomen (more or equal to 2 inches from the navel). (Patient not taking: Reported on 02/09/2024) 3 mL 2   tetrahydrozoline 0.05 % ophthalmic solution Place 1 drop into both eyes daily as needed (red eyes). (Patient not taking: Reported on 02/09/2024)     Vitamin D , Ergocalciferol , (DRISDOL ) 1.25 MG (50000 UNIT) CAPS capsule Take 50,000 Units by mouth every 7 (seven) days.     No current facility-administered medications for this visit.   Allergies  Allergen Reactions   Gadolinium Derivatives Nausea And Vomiting    Vomited with multihance  12/05/19 Vomited with multihance  12/05/19   Amlodipine  Other (See Comments)   Ivp Dye [Iodinated Contrast Media] Other (See Comments)    Caused kidney damage   Penicillins Swelling and Other (See Comments)   Tape Dermatitis   Cephalexin Rash   Latex Rash     Review of Systems: All systems reviewed and negative except where noted in HPI.   Lab Results  Component Value Date   WBC 4.9 01/04/2024   HGB 12.4 01/04/2024   HCT 37.7 01/04/2024   MCV 93.5 01/04/2024   PLT 214 01/04/2024     Lab Results  Component Value Date   NA 140 01/04/2024   CL 108 01/04/2024   K 3.7 01/04/2024   CO2 25 01/04/2024   BUN 12 01/04/2024   CREATININE 0.87 01/04/2024   EGFR 73 10/26/2023   CALCIUM  9.4 01/04/2024   ALBUMIN 4.1 09/11/2021   GLUCOSE 88 01/04/2024    Lab Results  Component Value Date   ALT 10 01/04/2024   AST 17 01/04/2024   ALKPHOS 74 09/11/2021   BILITOT 0.8 01/04/2024     Physical Exam: BP (!) 146/96   Pulse 66   Ht 5' 10 (1.778 m)   Wt 255 lb 6 oz (115.8 kg)   SpO2 100%   BMI 36.64 kg/m  Constitutional: Pleasant,female in no acute distress. HEENT: Normocephalic and atraumatic. Conjunctivae are normal. No scleral icterus. Neck supple.  Cardiovascular: Normal rate, regular rhythm.  Pulmonary/chest: Effort normal and breath sounds normal.  Abdominal: Soft, protuberant, nondistended, nontender. There are no masses palpable. No hepatomegaly. Extremities: no edema Lymphadenopathy: No cervical adenopathy noted. Neurological: Alert and oriented to person place and time. Skin: Skin is warm and dry. Psychiatric: Normal mood and affect. Behavior is normal.   ASSESSMENT: 73 y.o. female here for assessment of the following  1. Lower abdominal pain   2. Abnormal finding on GI tract imaging   3. Constipation, unspecified constipation type   4. Abdominal pain, epigastric   5. NSAID long-term use   6. Dysphagia, unspecified type    Patient with a few complaints, 2 areas of abdominal pain she complains of.  Intermittent short-lived lower abdominal pain ongoing for years.  Possible this could be related to constipation although unclear.  Colonoscopy up-to-date and looked okay in 2019.  She has had an abnormal CT scan in the past with nonspecific inflammatory changes which could have been related to enteritis at the time.  We discussed options to evaluate this.  We discussed repeating a CT scan given the changes on her last exam and persistent symptoms over time,  she wanted to proceed with that.  Of note she has a reported allergy to IV contrast although it sounds like maybe it made renal function worse  at the time?  Her kidney function is currently normal.  In the interim discussed treating her constipation, hopefully that will help reduce her symptoms, will start MiraLAX once daily and titrate that up as needed.  We reviewed her epigastric pain and dysphagia.  She takes BC powder twice daily every day for joint pains.  High risk for PUD.  We discussed this and I recommend she avoid all NSAIDs.  She can use Tylenol  as an alternative.  She is not taking Protonix , unclear why that was on her medication list, although I do recommend she start it at 40 mg daily given her symptoms.  Ultimately given the symptoms and her dysphagia, recommend EGD to dilate stricture as appropriate and rule out PUD etc. in regards to her symptoms.  Discussed risk benefits of the procedure and anesthesia and she wants to proceed.  Further recommendations pending the results   PLAN: - noncon CT scan abdomen / pelvis (she reports an allergy to contrast in the past) - start Miralax once daily and titrate as needed - stop BC powder and routine NSAID use - start protonix  40mg  / day  - schedule EGD with dilation at the [facility]  Christi Coward, MD [facility]  CC: Dorena Gander, MD

## 2024-02-09 NOTE — Patient Instructions (Signed)
 You have been scheduled for an endoscopy. Please follow written instructions given to you at your visit today.  If you use inhalers (even only as needed), please bring them with you on the day of your procedure.  If you take any of the following medications, they will need to be adjusted prior to your procedure:   DO NOT TAKE 7 DAYS PRIOR TO TEST- Trulicity (dulaglutide) Ozempic, Wegovy (semaglutide) Mounjaro (tirzepatide) Bydureon Bcise (exanatide extended release)  DO NOT TAKE 1 DAY PRIOR TO YOUR TEST Rybelsus (semaglutide) Adlyxin (lixisenatide) Victoza (liraglutide) Byetta (exanatide) ___________________________________________________________________________   Please purchase the following medications over the counter and take as directed: Miralax - take once daily  Stop BC powder  We have sent the following medications to your pharmacy for you to pick up at your convenience: Protonix  40 mg: Take once daily   You have been scheduled for a CT scan of the abdomen and pelvis at [facility], [place], 1st floor Radiology. The address can be found later in this AVS under upcoming appointment. You are scheduled on Monday, 6-30 at 3:00 pm. You should arrive at 2:30 pm for registration.   Plan on being there 30 to 60 minutes, depending on the type of exam you are having performed.   If you have any questions regarding your exam or if you need to reschedule, you may call [facility] Radiology Scheduling at [phone] between the hours of 8:00 am and 5:00 pm, Monday-Friday.     Thank you for entrusting me with your care and for choosing [facility], Dr. Alvester Johnson    If your blood pressure at your visit was 140/90 or greater, please contact your primary care physician to follow up on this. ______________________________________________________  If you are age 71 or older, your body mass index should be between 23-30. Your Body mass index is 36.64 kg/m. If this is out of the  aforementioned range listed, please consider follow up with your Primary Care Provider.  If you are age 71 or younger, your body mass index should be between 19-25. Your Body mass index is 36.64 kg/m. If this is out of the aformentioned range listed, please consider follow up with your Primary Care Provider.  ________________________________________________________  The [facility] providers would like to encourage you to use MYCHART to communicate with providers for non-urgent requests or questions.  Due to long hold times on the telephone, sending your provider a message by [facility] may be a faster and more efficient way to get a response.  Please allow 48 business hours for a response.  Please remember that this is for non-urgent requests.  _______________________________________________________  Due to recent changes in healthcare laws, you may see the results of your imaging and laboratory studies on MyChart before your provider has had a chance to review them.  We understand that in some cases there may be results that are confusing or concerning to you. Not all laboratory results come back in the same time frame and the provider may be waiting for multiple results in order to interpret others.  Please give us  48 hours in order for your provider to thoroughly review all the results before contacting the office for clarification of your results.

## 2024-02-11 ENCOUNTER — Other Ambulatory Visit: Payer: Self-pay

## 2024-02-15 ENCOUNTER — Other Ambulatory Visit: Payer: Self-pay

## 2024-02-22 ENCOUNTER — Ambulatory Visit (HOSPITAL_COMMUNITY)
Admission: RE | Admit: 2024-02-22 | Discharge: 2024-02-22 | Disposition: A | Discharge status: Home / Self Care | Source: Ambulatory Visit | Attending: Gastroenterology | Admitting: Gastroenterology

## 2024-02-22 ENCOUNTER — Ambulatory Visit: Payer: Self-pay | Admitting: Gastroenterology

## 2024-02-22 DIAGNOSIS — R1013 Epigastric pain: Secondary | ICD-10-CM | POA: Insufficient documentation

## 2024-02-22 DIAGNOSIS — R933 Abnormal findings on diagnostic imaging of other parts of digestive tract: Secondary | ICD-10-CM | POA: Insufficient documentation

## 2024-02-22 DIAGNOSIS — K5730 Diverticulosis of large intestine without perforation or abscess without bleeding: Secondary | ICD-10-CM | POA: Diagnosis not present

## 2024-02-22 DIAGNOSIS — N281 Cyst of kidney, acquired: Secondary | ICD-10-CM | POA: Diagnosis not present

## 2024-02-22 DIAGNOSIS — R1030 Lower abdominal pain, unspecified: Secondary | ICD-10-CM | POA: Insufficient documentation

## 2024-02-22 DIAGNOSIS — K449 Diaphragmatic hernia without obstruction or gangrene: Secondary | ICD-10-CM | POA: Diagnosis not present

## 2024-02-26 ENCOUNTER — Other Ambulatory Visit: Payer: Self-pay | Admitting: Pharmacist

## 2024-02-26 NOTE — Progress Notes (Signed)
 Specialty Pharmacy Ongoing Clinical Assessment Note  Laura Jimenez is a 73 y.o. female who is being followed by the specialty pharmacy service for RxSp HIV   Patient's specialty medication(s) reviewed today: Bictegravir-Emtricitab-Tenofov (Biktarvy )   Missed doses in the last 4 weeks: All   Patient/Caregiver did not have any additional questions or concerns.   Therapeutic benefit summary: Unable to assess   Adverse events/side effects summary: Unable to assess   Patient's therapy is appropriate to: Continue    Goals Addressed             This Visit's Progress    Achieve Undetectable HIV Viral Load < 20   On track    Patient is on track. Patient will work on increased adherence      Comply with lab assessments   On track    Patient is on track. Patient will adhere to provider and/or lab appointments      Increase CD4 count until steady state   On track    Patient is on track. Patient will work on increased adherence      Maintain optimal adherence to therapy   Worsening    Patient is not on track and improving. Patient will work on increased adherence         Follow up: 6 months  Alan JINNY Geralds Specialty Pharmacist

## 2024-03-14 ENCOUNTER — Other Ambulatory Visit (HOSPITAL_COMMUNITY): Payer: Self-pay

## 2024-03-18 ENCOUNTER — Other Ambulatory Visit: Payer: Self-pay

## 2024-03-22 ENCOUNTER — Ambulatory Visit: Admitting: Gastroenterology

## 2024-03-22 ENCOUNTER — Emergency Department (HOSPITAL_COMMUNITY)
Admission: EM | Admit: 2024-03-22 | Discharge: 2024-03-22 | Disposition: A | Discharge status: Home / Self Care | Source: Ambulatory Visit | Attending: Emergency Medicine | Admitting: Emergency Medicine

## 2024-03-22 ENCOUNTER — Encounter: Payer: Self-pay | Admitting: Gastroenterology

## 2024-03-22 ENCOUNTER — Encounter (HOSPITAL_COMMUNITY): Payer: Self-pay | Admitting: Emergency Medicine

## 2024-03-22 ENCOUNTER — Emergency Department (HOSPITAL_COMMUNITY)

## 2024-03-22 DIAGNOSIS — E039 Hypothyroidism, unspecified: Secondary | ICD-10-CM | POA: Insufficient documentation

## 2024-03-22 DIAGNOSIS — Z21 Asymptomatic human immunodeficiency virus [HIV] infection status: Secondary | ICD-10-CM | POA: Insufficient documentation

## 2024-03-22 DIAGNOSIS — I10 Essential (primary) hypertension: Secondary | ICD-10-CM | POA: Insufficient documentation

## 2024-03-22 DIAGNOSIS — E109 Type 1 diabetes mellitus without complications: Secondary | ICD-10-CM | POA: Diagnosis not present

## 2024-03-22 DIAGNOSIS — Z7982 Long term (current) use of aspirin: Secondary | ICD-10-CM | POA: Diagnosis not present

## 2024-03-22 DIAGNOSIS — Z8544 Personal history of malignant neoplasm of other female genital organs: Secondary | ICD-10-CM | POA: Insufficient documentation

## 2024-03-22 DIAGNOSIS — Z87891 Personal history of nicotine dependence: Secondary | ICD-10-CM | POA: Diagnosis not present

## 2024-03-22 DIAGNOSIS — E1065 Type 1 diabetes mellitus with hyperglycemia: Secondary | ICD-10-CM | POA: Diagnosis not present

## 2024-03-22 DIAGNOSIS — Z91040 Latex allergy status: Secondary | ICD-10-CM | POA: Insufficient documentation

## 2024-03-22 DIAGNOSIS — Z79899 Other long term (current) drug therapy: Secondary | ICD-10-CM | POA: Diagnosis not present

## 2024-03-22 DIAGNOSIS — R079 Chest pain, unspecified: Secondary | ICD-10-CM | POA: Diagnosis not present

## 2024-03-22 LAB — CBC WITH DIFFERENTIAL/PLATELET
Abs Immature Granulocytes: 0.03 K/uL (ref 0.00–0.07)
Basophils Absolute: 0.1 K/uL (ref 0.0–0.1)
Basophils Relative: 1 %
Eosinophils Absolute: 0 K/uL (ref 0.0–0.5)
Eosinophils Relative: 0 %
HCT: 39.1 % (ref 36.0–46.0)
Hemoglobin: 12.5 g/dL (ref 12.0–15.0)
Immature Granulocytes: 0 %
Lymphocytes Relative: 33 %
Lymphs Abs: 2.9 K/uL (ref 0.7–4.0)
MCH: 30.7 pg (ref 26.0–34.0)
MCHC: 32 g/dL (ref 30.0–36.0)
MCV: 96.1 fL (ref 80.0–100.0)
Monocytes Absolute: 0.7 K/uL (ref 0.1–1.0)
Monocytes Relative: 8 %
Neutro Abs: 5.3 K/uL (ref 1.7–7.7)
Neutrophils Relative %: 58 %
Platelets: 312 K/uL (ref 150–400)
RBC: 4.07 MIL/uL (ref 3.87–5.11)
RDW: 13 % (ref 11.5–15.5)
WBC: 9 K/uL (ref 4.0–10.5)
nRBC: 0 % (ref 0.0–0.2)

## 2024-03-22 LAB — BASIC METABOLIC PANEL WITH GFR
Anion gap: 8 (ref 5–15)
BUN: 14 mg/dL (ref 8–23)
CO2: 25 mmol/L (ref 22–32)
Calcium: 9.5 mg/dL (ref 8.9–10.3)
Chloride: 105 mmol/L (ref 98–111)
Creatinine, Ser: 0.89 mg/dL (ref 0.44–1.00)
GFR, Estimated: >60 mL/min (ref >60)
Glucose, Bld: 103 mg/dL — ABNORMAL HIGH (ref 70–99)
Potassium: 3.8 mmol/L (ref 3.5–5.1)
Sodium: 138 mmol/L (ref 135–145)

## 2024-03-22 LAB — TROPONIN I (HIGH SENSITIVITY)
Troponin I (High Sensitivity): 33 ng/L — ABNORMAL HIGH (ref <18)
Troponin I (High Sensitivity): 31 ng/L — ABNORMAL HIGH (ref <18)

## 2024-03-22 MED ORDER — NITROGLYCERIN 2 % TD OINT
1.0000 [in_us] | TOPICAL_OINTMENT | Freq: Once | TRANSDERMAL | Status: AC
  Administered 2024-03-22: 1 [in_us] via TOPICAL
  Filled 2024-03-22: qty 1

## 2024-03-22 MED ORDER — IRBESARTAN 150 MG PO TABS
150.0000 mg | ORAL_TABLET | Freq: Once | ORAL | Status: AC
  Administered 2024-03-22: 150 mg via ORAL
  Filled 2024-03-22: qty 1

## 2024-03-22 MED ORDER — HYDRALAZINE HCL 20 MG/ML IJ SOLN
10.0000 mg | Freq: Once | INTRAMUSCULAR | Status: AC
  Administered 2024-03-22: 10 mg via INTRAVENOUS
  Filled 2024-03-22: qty 1

## 2024-03-22 MED ORDER — CLONIDINE HCL 0.1 MG PO TABS
0.1000 mg | ORAL_TABLET | Freq: Once | ORAL | Status: AC
  Administered 2024-03-22: 0.1 mg via ORAL
  Filled 2024-03-22: qty 1

## 2024-03-22 MED ORDER — SODIUM CHLORIDE 0.9 % IV SOLN: 500.0000 mL | Freq: Once | INTRAVENOUS | Status: AC

## 2024-03-22 MED ORDER — OLMESARTAN MEDOXOMIL 20 MG PO TABS: 20.0000 mg | ORAL_TABLET | Freq: Every day | ORAL | 2 refills | Status: AC

## 2024-03-22 NOTE — ED Provider Notes (Signed)
 [place] EMERGENCY DEPARTMENT AT [facility] Provider Note  CSN: [id number] Arrival date & time: 03/22/24 1012  Chief Complaint(s) Hypertension  HPI Laura Jimenez is a 73 y.o. female with past medical history as below, significant for GERD, hyperlipidemia, hypertension, T1DM, HIV who presents to the ED with complaint of evaded blood pressure reading  Patient presented to GI office today Dr. Leigh for EGD, blood pressure was elevated in the office.  Procedure was canceled and she was sent to the ER for evaluation  Patient reports she is compliant with her typical medications.  No use of stimulants.  No vomiting or nausea.  Typical p.o. intake.  She reports this morning she did have some chest pain that was transient for a few seconds and is since resolved and not reoccurred.  No dyspnea or lightheadedness, no nausea or diaphoresis.  She is currently asymptomatic.  Reports she like to take her blood pressure medicine at nighttime because it makes her sleepy  Past Medical History Past Medical History:  Diagnosis Date   Anxiety    Arthritis    Bell's palsy    left eye   Cancer (HCC)    CVA (cerebral infarction)    Eye swelling 07/31/2021   GERD (gastroesophageal reflux disease)    Gout    HIV infection (HCC)    HTN (hypertension) 02/10/2023   Hyperlipidemia    Hypothyroidism due to Hashimoto's thyroiditis    Morbid obesity with BMI of 40.0-44.9, adult (HCC)    Nonadherence to medical treatment 10/25/2023   Postmenopausal bleeding    Type 1 diabetes mellitus with hyperglycemia [facility])    Patient Active Problem List   Diagnosis Date Noted   Nonadherence to medical treatment 10/25/2023   HTN (hypertension) 02/10/2023   Primary osteoarthritis, right shoulder 08/05/2022   Arthritis of left shoulder region 02/26/2022   Eye swelling 07/31/2021   HIV disease (HCC) 11/21/2020   Postoperative seroma of skin after non-dermatologic procedure 10/09/2020   Right leg  swelling 10/09/2020   Acute pain of left shoulder 10/06/2018   Hypothyroidism 10/23/2017   Hyperlipidemia, mixed 08/11/2017   CVA (cerebral vascular accident) (HCC) 08/11/2017   Morbid obesity (HCC) 08/11/2017   Vulvar cancer (HCC) 11/13/2011   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 Authorizing Provider  acetaminophen  (TYLENOL ) 325 MG tablet as needed.    [provider]  allopurinol (ZYLOPRIM) 100 MG tablet Prescription ran out Patient not taking: Reported on 02/09/2024 05/22/21   [provider]  ascorbic acid (VITAMIN C) 500 MG tablet Take 1 tablet by mouth daily.    [provider]  aspirin  (PX ENTERIC ASPIRIN ) 81 MG EC tablet Take 1 tablet (81 mg total) by mouth daily. Swallow whole. 09/25/17   Rolinda Millman, MD  Aspirin -Salicylamide-Caffeine (BC HEADACHE POWDER PO) Take by mouth.    [provider]  bictegravir-emtricitabine -tenofovir  AF (BIKTARVY ) 50-200-25 MG TABS tablet Take 1 tablet by mouth daily. 08/28/23   Fleeta Kathie Jomarie LOISE, MD  brompheniramine-pseudoephedrine-DM 30-2-10  MG/5ML syrup Take 5 mLs by mouth 4 (four) times daily as needed. Patient not taking: Reported on 02/09/2024 01/22/23   Leath-Warren, Etta PARAS, NP  clobetasol  cream (TEMOVATE ) 0.05 % Apply 1 Application topically 2 (two) times daily. Apply to affected areas twice daily for up to 2 weeks for flares 04/09/23   Alm Delon [name],  DO  cloNIDine  (CATAPRES ) 0.1 MG tablet Take 0.1 mg by mouth 2 (two) times daily. 10/07/23   [provider]  levothyroxine  (SYNTHROID ) 25 MCG tablet Take 25 mcg by mouth every morning. 02/12/21   [provider]  metoprolol  succinate (TOPROL -XL) 25 MG 24 hr tablet Take 25 mg by mouth at bedtime. 10/24/20   [provider]  mupirocin  ointment (BACTROBAN ) 2 % Apply 1 Application topically 2 (two) times daily. 07/21/23   Alm Delon [name], DO  olmesartan  (BENICAR ) 20 MG tablet Take 40 mg by mouth daily.    [provider]  pantoprazole  (PROTONIX ) 40 MG tablet Take 1 tablet (40 mg total) by mouth daily. 02/09/24   Armbruster, Elspeth SQUIBB, MD  polyethylene glycol (MIRALAX ) 17 g packet Take 17 g by mouth daily. 02/09/24   Armbruster, Elspeth SQUIBB, MD  rosuvastatin  (CRESTOR ) 20 MG tablet Take 20 mg by mouth daily. 01/09/23   [provider]  SQ injection lenacapavir (SUNLENCA ) 463.5 MG/1.5ML SQ injection Inject 3 mLs (927 mg total) into the skin every 6 (six) months. Administer each injection subcutaneously at separate sites in the abdomen (more or equal to 2 inches from the navel). Patient not taking: Reported on 02/09/2024 12/08/22   Kuppelweiser, Cassie L, RPH-CPP  tetrahydrozoline 0.05 % ophthalmic solution Place 1 drop into both eyes daily as needed (red eyes). Patient not taking: Reported on 02/09/2024    [provider]  triamterene -hydrochlorothiazide  (MAXZIDE -25) 37.5-25 MG tablet Take 1 tablet by mouth daily.    [provider]  Vitamin D , Ergocalciferol , (DRISDOL ) 1.25 MG (50000 UNIT) CAPS capsule Take 50,000 Units by mouth every 7 (seven) days.    [provider]                                                                                                                                    Past Surgical History Past Surgical History:  Procedure Laterality Date   CATARACT EXTRACTION W/PHACO Left 03/06/2023   Procedure: CATARACT  EXTRACTION PHACO AND INTRAOCULAR LENS PLACEMENT (IOC);  Surgeon: Juli Blunt, MD;  Location: [facility];  Service: Ophthalmology;  Laterality: Left;  CDE 4.01   CATARACT EXTRACTION W/PHACO Right 03/20/2023   Procedure: CATARACT EXTRACTION PHACO AND INTRAOCULAR LENS PLACEMENT (IOC);  Surgeon: Juli Blunt, MD;  Location: [facility];  Service: Ophthalmology;  Laterality: Right;  CDE 2.61   COLONOSCOPY  2000   HYSTEROSCOPY WITH D & C N/A 04/19/2013   Procedure: DILATATION AND CURETTAGE /HYSTEROSCOPY;  Surgeon: Harland JAYSON Birkenhead, MD;  Location: [facility];  Service: Gynecology;  Laterality: N/A;  LYMPH NODE BIOPSY Right 09/17/2020   Procedure: LYMPH NODE BIOPSY; INGUINAL;  Surgeon: Kallie Manuelita BROCKS, MD;  Location: [facility];  Service: General;  Laterality: Right;   Family History Family History  Problem Relation Age of Onset   Hypertension Mother    Hypertension Sister    Arthritis Sister    Cancer Other    Diabetes Other    Healthy Daughter     Social History Social History   Tobacco Use   Smoking status: Former    Current packs/day: 0.00    Average packs/day: 1 pack/day for 5.0 years (5.0 ttl pk-yrs)    Types: Cigarettes    Quit date: 1990    Years since quitting: 35.5    Passive exposure: Never   Smokeless tobacco: Never  Vaping Use   Vaping status: Never Used  Substance Use Topics   Alcohol use: No   Drug use: No   Allergies Gadolinium derivatives, Amlodipine , Ivp dye [iodinated contrast media], Penicillins, Tape, Cephalexin, and Latex  Review of Systems A thorough review of systems was obtained and all systems are negative except as noted in the HPI and PMH.   Physical Exam Vital Signs  I have reviewed the triage vital signs BP (!) 194/84   Pulse 64   Temp (!) 97.4 F (36.3 C) (Oral)   Resp 11   Ht 5' 10 (1.778 m)   Wt 113.4 kg   SpO2 100%   BMI 35.87 kg/m  Physical Exam Vitals and nursing note reviewed.  Constitutional:      General: She is not in acute  distress.    Appearance: Normal appearance. She is obese.  HENT:     Head: Normocephalic and atraumatic.     Right Ear: External ear normal.     Left Ear: External ear normal.     Nose: Nose normal.     Mouth/Throat:     Mouth: Mucous membranes are moist.  Eyes:     General: No scleral icterus.       Right eye: No discharge.        Left eye: No discharge.  Cardiovascular:     Rate and Rhythm: Normal rate and regular rhythm.     Pulses: Normal pulses.     Heart sounds: Normal heart sounds.  Pulmonary:     Effort: Pulmonary effort is normal. No respiratory distress.     Breath sounds: Normal breath sounds. No stridor.  Abdominal:     General: Abdomen is flat. There is no distension.     Palpations: Abdomen is soft.     Tenderness: There is no abdominal tenderness.  Musculoskeletal:     Cervical back: No rigidity.     Right lower leg: No edema.     Left lower leg: No edema.  Skin:    General: Skin is warm and dry.     Capillary Refill: Capillary refill takes less than 2 seconds.  Neurological:     Mental Status: She is alert.  Psychiatric:        Mood and Affect: Mood normal.        Behavior: Behavior normal. Behavior is cooperative.     ED Results and Treatments Labs (all labs ordered are listed, but only abnormal results are displayed) Labs Reviewed  BASIC METABOLIC PANEL WITH GFR - Abnormal; Notable for the following components:      Result Value   Glucose, Bld 103 (*)    All other components within normal limits  TROPONIN  I (HIGH SENSITIVITY) - Abnormal; Notable for the following components:   Troponin I (High Sensitivity) 33 (*)    All other components within normal limits  CBC WITH DIFFERENTIAL/PLATELET  TROPONIN I (HIGH SENSITIVITY)                                                                                                                          Radiology DG Chest 1 View Result Date: 03/22/2024 CLINICAL DATA:  cp EXAM: CHEST  1 VIEW COMPARISON:   01/22/2023 FINDINGS: Lungs are clear.  No pneumothorax. Heart size and mediastinal contours are within normal limits. No effusion. Visualized bones unremarkable. IMPRESSION: No acute cardiopulmonary disease. Electronically Signed   By: JONETTA Faes M.D.   On: 03/22/2024 12:27    Pertinent labs & imaging results that were available during my care of the patient were reviewed by me and considered in my medical decision making (see MDM for details).  Medications Ordered in ED Medications  nitroGLYCERIN  (NITROGLYN) 2 % ointment 1 inch (has no administration in time range)  hydrALAZINE  (APRESOLINE ) injection 10 mg (10 mg Intravenous Given 03/22/24 1125)  cloNIDine  (CATAPRES ) tablet 0.1 mg (0.1 mg Oral Given 03/22/24 1530)  irbesartan  (AVAPRO ) tablet 150 mg (150 mg Oral Given 03/22/24 1530)                                                                                                                                     Procedures Procedures  (including critical care time)  Medical Decision Making / ED Course    Medical Decision Making:    DESTANEY SARKIS is a 73 y.o. female with past medical history as below, significant for GERD, hyperlipidemia, hypertension, T1DM, HIV who presents to the ED with complaint of evaded blood pressure reading. The complaint involves an extensive differential diagnosis and also carries with it a high risk of complications and morbidity.  Serious etiology was considered. Ddx includes but is not limited to: Hypertensive emergency, poorly controlled hypertension, renal dysfunction, ACS, PTX, PNA, etc.  Complete initial physical exam performed, notably the patient was in no acute distress, resting comfortably.    Reviewed and confirmed nursing documentation for past medical history, family history, social history.  Vital signs reviewed.    Chest pain> - Transient episode of chest pain this morning that lasted for few seconds when she woke up and has since resolved and  not recurred - no cp in ER, no EKG changes - trop 33, delta pending; likely demand ischemia in setting of poorly controlled hypertension  Elevated blood pressure reading > - Poorly controlled hypertension, doubt hypertensive emergency at this time - given home bp meds, bp improved   Clinical Course as of 03/22/24 1542  Tue Mar 22, 2024  1108 Blood pressure from previous office visits reviewed, poorly controlled hypertension, systolic frequently elevated, 859d to 190s [SG]  1520 Troponin I (High Sensitivity)(!): 33 - [SG]    Clinical Course User Index [SG] Elnor Jayson LABOR, DO    Handoff dr melvenia pending delta trop and recheck                Additional history obtained: -Additional history obtained from family -External records from outside source obtained and reviewed including: Chart review including previous notes, labs, imaging, consultation notes including  Recent GI documentation, home medications, prior labs   Lab Tests: -I ordered, reviewed, and interpreted labs.   The pertinent results include:   Labs Reviewed  BASIC METABOLIC PANEL WITH GFR - Abnormal; Notable for the following components:      Result Value   Glucose, Bld 103 (*)    All other components within normal limits  TROPONIN I (HIGH SENSITIVITY) - Abnormal; Notable for the following components:   Troponin I (High Sensitivity) 33 (*)    All other components within normal limits  CBC WITH DIFFERENTIAL/PLATELET  TROPONIN I (HIGH SENSITIVITY)    Notable for trop mild elev  EKG   EKG Interpretation Date/Time:  Tuesday March 22 2024 12:14:54 EDT Ventricular Rate:  70 PR Interval:  245 QRS Duration:  102 QT Interval:  403 QTC Calculation: 435 R Axis:   41  Text Interpretation: Sinus rhythm Prolonged PR interval Borderline repolarization abnormality Minimal ST elevation, anterior leads Interpretation limited secondary to artifact new twi in v6 o/w  similar to prior Confirmed by Elnor Jayson  ([id number]) on 03/22/2024 12:34:53 PM         Imaging Studies ordered: I ordered imaging studies including cxr I independently visualized the following imaging with scope of interpretation limited to determining acute life threatening conditions related to emergency care; findings noted above I agree with the radiologist interpretation If any imaging was obtained with contrast I closely monitored patient for any possible adverse reaction a/w contrast administration in the emergency department   Medicines ordered and prescription drug management: Meds ordered this encounter  Medications   hydrALAZINE  (APRESOLINE ) injection 10 mg   cloNIDine  (CATAPRES ) tablet 0.1 mg   irbesartan  (AVAPRO ) tablet 150 mg   nitroGLYCERIN  (NITROGLYN) 2 % ointment 1 inch    -I have reviewed the patients home medicines and have made adjustments as needed   Consultations Obtained: na   Cardiac Monitoring: The patient was maintained on a cardiac monitor.  I personally viewed and interpreted the cardiac monitored which showed an underlying rhythm of: nsr Continuous pulse oximetry interpreted by myself, 100% on ra.    Social Determinants of Health:  Diagnosis or treatment significantly limited by social determinants of health: obesity   Reevaluation: After the interventions noted above, I reevaluated the patient and found that they have improved  Co morbidities that complicate the patient evaluation  Past Medical History:  Diagnosis Date   Anxiety    Arthritis    Bell's palsy    left eye   Cancer (HCC)    CVA (cerebral infarction)    Eye swelling 07/31/2021   GERD (  gastroesophageal reflux disease)    Gout    HIV infection (HCC)    HTN (hypertension) 02/10/2023   Hyperlipidemia    Hypothyroidism due to Hashimoto's thyroiditis    Morbid obesity with BMI of 40.0-44.9, adult (HCC)    Nonadherence to medical treatment 10/25/2023   Postmenopausal bleeding    Type 1 diabetes mellitus with  hyperglycemia (HCC)       Dispostion: Disposition decision including need for hospitalization was considered, and patient disposition pending at time of sign out.    Final Clinical Impression(s) / ED Diagnoses Final diagnoses:  Poorly-controlled hypertension        Elnor Jayson LABOR, DO 03/22/24 1542

## 2024-03-22 NOTE — Discharge Instructions (Addendum)
 Change your clonidine  dosing to every 12 hours. Take olmesartan  daily. Monitor your blood pressure at home and discuss further medication adjustments with your PCP. Return to the ED for any new symptoms of concern.

## 2024-03-22 NOTE — Progress Notes (Unsigned)
 Patient here for EGD today. States she normally takes her BP meds at night, took her medications last night. BP repeated over multiples times at multiple sites, repeatedly 220-240s over high 90s low 100s. Last 241/97. She told staff she had chest pains when checking in but denied that with me when asked. She denies any symptoms currently and states she feels well, although staff tell me she did complain of chest pain. I am concerned that despite she despite being compliant with her BP meds but extremely hypertensive on evaluation today. I do not think it safe to do her exam today and get anesthesia, needs better BP control. Procedure will be cancelled and rescheduled for another time to allow her to get her BP controlled. Otherwise given severity of her HTN today recommend she go to the ED for further evaluation, systolics are extremely high and given reports of chest pains previously. Her daughter will take her there now. Patient agrees.

## 2024-03-22 NOTE — Progress Notes (Signed)
 Patient to be rescheduled due to high blood pressure. Three month recall. Dr Leigh

## 2024-03-22 NOTE — ED Provider Notes (Signed)
 Care of patient assumed from Dr. Elnor.  This patient presented from GI office for elevated blood pressure, reportedly in the 240s SBP.  She has been asymptomatic other than a very brief episode of chest tightness that she was getting out of bed this morning.  Currently awaiting delta troponin. Physical Exam  BP (!) 194/84   Pulse 64   Temp (!) 97.4 F (36.3 C) (Oral)   Resp 11   Ht 5' 10 (1.778 m)   Wt 113.4 kg   SpO2 100%   BMI 35.87 kg/m   Physical Exam Vitals and nursing note reviewed.  Constitutional:      General: She is not in acute distress.    Appearance: Normal appearance. She is well-developed. She is not ill-appearing, toxic-appearing or diaphoretic.  HENT:     Head: Normocephalic and atraumatic.     Right Ear: External ear normal.     Left Ear: External ear normal.     Nose: Nose normal.     Mouth/Throat:     Mouth: Mucous membranes are moist.  Eyes:     Extraocular Movements: Extraocular movements intact.     Conjunctiva/sclera: Conjunctivae normal.  Cardiovascular:     Rate and Rhythm: Normal rate and regular rhythm.  Pulmonary:     Effort: Pulmonary effort is normal. No respiratory distress.  Abdominal:     General: There is no distension.     Palpations: Abdomen is soft.  Musculoskeletal:        General: No swelling. Normal range of motion.     Cervical back: Normal range of motion and neck supple.  Skin:    General: Skin is warm and dry.     Coloration: Skin is not jaundiced or pale.  Neurological:     General: No focal deficit present.     Mental Status: She is alert and oriented to person, place, and time.  Psychiatric:        Mood and Affect: Mood normal.        Behavior: Behavior normal.     Procedures  Procedures  ED Course / MDM   Clinical Course as of 03/22/24 1537  Tue Mar 22, 2024  1108 Blood pressure from previous office visits reviewed, poorly controlled hypertension, systolic frequently elevated, 859d to 190s [SG]  1520 Troponin  I (High Sensitivity)(!): 33 - [SG]    Clinical Course User Index [SG] Elnor Jayson LABOR, DO   Medical Decision Making Amount and/or Complexity of Data Reviewed Labs: ordered. Decision-making details documented in ED Course. Radiology: ordered.  Risk Prescription drug management.   On assessment, patient resting comfortably.  I reviewed her current blood pressure medications.  Patient reportedly takes 2 tablets of 0.2 mg clonidine  nightly.  I suspect she is experiencing rebound hypertension daily given patient's kidney function and typical half-life of clonidine .  Her only other blood pressure medication currently is HCTZ.  In the past, she has been prescribed Toprol  and olmesartan .  Patient second troponin showed no increase.  She has been asymptomatic throughout her stay in the ED.  Patient is agreeable to twice daily dosing of her clonidine .  Will also prescribe a losartan that she can get restarted on.  Patient to monitor blood pressure at home and follow-up with PCP.  She was discharged in stable condition.       Melvenia Motto, MD 03/22/24 2018

## 2024-03-22 NOTE — ED Triage Notes (Signed)
 Pt comes in daughter for high blood pressure being sent over from [place] unable to do procedure do to HTN. She takes her bp meds at night. Her clonidine  she only takes at night not in the morning states it makes her sleepy.

## 2024-04-07 ENCOUNTER — Other Ambulatory Visit (HOSPITAL_COMMUNITY): Payer: Self-pay

## 2024-04-07 ENCOUNTER — Other Ambulatory Visit: Payer: Self-pay | Admitting: Pharmacist

## 2024-04-07 DIAGNOSIS — B20 Human immunodeficiency virus [HIV] disease: Secondary | ICD-10-CM

## 2024-04-07 MED ORDER — LENACAPAVIR SODIUM 463.5 MG/1.5ML ~~LOC~~ SOLN
927.0000 mg | SUBCUTANEOUS | 2 refills | Status: DC
Start: 2024-04-07 — End: 2024-06-29
  Filled 2024-04-08: qty 3, 180d supply, fill #0

## 2024-04-08 ENCOUNTER — Other Ambulatory Visit: Payer: Self-pay

## 2024-04-08 ENCOUNTER — Other Ambulatory Visit (HOSPITAL_COMMUNITY): Payer: Self-pay

## 2024-04-08 NOTE — Progress Notes (Signed)
 Specialty Pharmacy Refill Coordination Note  Laura Jimenez is a 73 y.o. female assessed today regarding refills of clinic administered specialty medication(s) Lenacapavir Sodium  (SUNLENCA )   Clinic requested Courier to Provider Office   Delivery date: 04/12/24   Verified address: [address] [place] [address]   Medication will be filled on 04/11/24.

## 2024-04-11 ENCOUNTER — Other Ambulatory Visit: Payer: Self-pay

## 2024-04-12 ENCOUNTER — Telehealth: Payer: Self-pay

## 2024-04-12 NOTE — Telephone Encounter (Signed)
 [facility] Patient Advocate Encounter  Patient's medications SUNLENCA  have been couriered to [facility] from [facility] and will be administered at the patients appointment on 04/13/24.  Charmaine Sharps, CPhT Specialty Pharmacy Patient [facility] for Infectious Disease Phone: [phone] Fax:  [phone]

## 2024-04-12 NOTE — Progress Notes (Unsigned)
 Subjective:  Chief complaint: follow-up for HIV disease on medications   Patient ID: Laura Jimenez, female    DOB: 1950-10-31, 73 y.o.   MRN: [id number]  HPI  Past Medical History:  Diagnosis Date   Anxiety    Arthritis    Bell's palsy    left eye   Cancer (HCC)    CVA (cerebral infarction)    Eye swelling 07/31/2021   GERD (gastroesophageal reflux disease)    Gout    HIV infection (HCC)    HTN (hypertension) 02/10/2023   Hyperlipidemia    Hypothyroidism due to Hashimoto's thyroiditis    Morbid obesity with BMI of 40.0-44.9, adult (HCC)    Nonadherence to medical treatment 10/25/2023   Postmenopausal bleeding    Type 1 diabetes mellitus with hyperglycemia [facility])     Past Surgical History:  Procedure Laterality Date   CATARACT EXTRACTION W/PHACO Left 03/06/2023   Procedure: CATARACT EXTRACTION PHACO AND INTRAOCULAR LENS PLACEMENT (IOC);  Surgeon: Juli Blunt, MD;  Location: [facility];  Service: Ophthalmology;  Laterality: Left;  CDE 4.01   CATARACT EXTRACTION W/PHACO Right 03/20/2023   Procedure: CATARACT EXTRACTION PHACO AND INTRAOCULAR LENS PLACEMENT (IOC);  Surgeon: Juli Blunt, MD;  Location: [facility];  Service: Ophthalmology;  Laterality: Right;  CDE 2.61   COLONOSCOPY  2000   HYSTEROSCOPY WITH D & C N/A 04/19/2013   Procedure: DILATATION AND CURETTAGE /HYSTEROSCOPY;  Surgeon: Harland JAYSON Birkenhead, MD;  Location: [facility];  Service: Gynecology;  Laterality: N/A;   LYMPH NODE BIOPSY Right 09/17/2020   Procedure: LYMPH NODE BIOPSY; INGUINAL;  Surgeon: Kallie Manuelita JAYSON, MD;  Location: [facility];  Service: General;  Laterality: Right;    Family History  Problem Relation Age of Onset   Hypertension Mother    Hypertension Sister    Arthritis Sister    Cancer Other    Diabetes Other    Healthy Daughter       Social History   Socioeconomic History   Marital status: Widowed    Spouse name: Not on file   Number of children: Not on file   Years of education: Not on file    Highest education level: 10th grade  Occupational History   Not on file  Tobacco Use   Smoking status: Former    Current packs/day: 0.00    Average packs/day: 1 pack/day for 5.0 years (5.0 ttl pk-yrs)    Types: Cigarettes    Quit date: 13    Years since quitting: 35.6    Passive exposure: Never   Smokeless tobacco: Never  Vaping Use   Vaping status: Never Used  Substance and Sexual Activity   Alcohol use: No   Drug use: No   Sexual activity: Not Currently    Birth control/protection: None, Post-menopausal  Other Topics Concern   Not on file  Social History Narrative   Grew up in [place], [place].   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   2 children, boy and girl. Son is 40s, and special needs.   Daughter in her 26s, is a Engineer, civil (consulting).   Eats all food groups.   Wears seatbelt.   Attends church.   Enjoys watching tv, listening to [employer], and spending time with friends.   Enjoys going out to eat.    Social Drivers of Corporate investment banker Strain: Low Risk  (06/27/2020)   Overall Financial Resource Strain (CARDIA)    Difficulty of Paying Living Expenses: Not hard at  all  Food Insecurity: No Food Insecurity (06/27/2020)   Hunger Vital Sign    Worried About Running Out of Food in the Last Year: Never true    Ran Out of Food in the Last Year: Never true  Transportation Needs: No Transportation Needs (06/27/2020)   PRAPARE - Administrator, Civil Service (Medical): No    Lack of Transportation (Non-Medical): No  Physical Activity: Sufficiently Active (06/27/2020)   Exercise Vital Sign    Days of Exercise per Week: 7 days    Minutes of Exercise per Session: 60 min  Stress: Stress Concern Present (07/03/2020)   [employer] of Occupational Health - Occupational Stress Questionnaire    Feeling of Stress : To some extent  Social Connections: Moderately Isolated (06/27/2020)   Social Connection and Isolation Panel    Frequency of Communication  with Friends and Family: More than three times a week    Frequency of Social Gatherings with Friends and Family: Once a week    Attends Religious Services: More than 4 times per year    Active Member of [employer] or Organizations: No    Attends Banker Meetings: Never    Marital Status: Widowed    Allergies  Allergen Reactions   Gadolinium Derivatives Nausea And Vomiting    Vomited with multihance  12/05/19 Vomited with multihance  12/05/19   Amlodipine  Other (See Comments)   Ivp Dye [Iodinated Contrast Media] Other (See Comments)    Caused kidney damage   Penicillins Swelling and Other (See Comments)   Tape Dermatitis   Cephalexin Rash   Latex Rash     Current Outpatient Medications:    acetaminophen  (TYLENOL ) 325 MG tablet, as needed., Disp: , Rfl:    allopurinol (ZYLOPRIM) 100 MG tablet, Prescription ran out (Patient not taking: Reported on 02/09/2024), Disp: , Rfl:    ascorbic acid (VITAMIN C) 500 MG tablet, Take 1 tablet by mouth daily., Disp: , Rfl:    aspirin  (PX ENTERIC ASPIRIN ) 81 MG EC tablet, Take 1 tablet (81 mg total) by mouth daily. Swallow whole., Disp: 30 tablet, Rfl: 12   Aspirin -Salicylamide-Caffeine (BC HEADACHE POWDER PO), Take by mouth., Disp: , Rfl:    bictegravir-emtricitabine -tenofovir  AF (BIKTARVY ) 50-200-25 MG TABS tablet, Take 1 tablet by mouth daily., Disp: 30 tablet, Rfl: 2   brompheniramine-pseudoephedrine-DM 30-2-10 MG/5ML syrup, Take 5 mLs by mouth 4 (four) times daily as needed. (Patient not taking: Reported on 02/09/2024), Disp: 140 mL, Rfl: 0   clobetasol  cream (TEMOVATE ) 0.05 %, Apply 1 Application topically 2 (two) times daily. Apply to affected areas twice daily for up to 2 weeks for flares, Disp: 45 g, Rfl: 0   cloNIDine  (CATAPRES ) 0.1 MG tablet, Take 0.1 mg by mouth 2 (two) times daily., Disp: , Rfl:    levothyroxine  (SYNTHROID ) 25 MCG tablet, Take 25 mcg by mouth every morning., Disp: , Rfl:    metoprolol  succinate (TOPROL -XL) 25 MG 24 hr  tablet, Take 25 mg by mouth at bedtime., Disp: , Rfl:    mupirocin  ointment (BACTROBAN ) 2 %, Apply 1 Application topically 2 (two) times daily., Disp: 22 g, Rfl: 0   olmesartan  (BENICAR ) 20 MG tablet, Take 1 tablet (20 mg total) by mouth daily., Disp: 30 tablet, Rfl: 2   pantoprazole  (PROTONIX ) 40 MG tablet, Take 1 tablet (40 mg total) by mouth daily., Disp: 90 tablet, Rfl: 1   polyethylene glycol (MIRALAX ) 17 g packet, Take 17 g by mouth daily., Disp: , Rfl:    rosuvastatin  (CRESTOR )  20 MG tablet, Take 20 mg by mouth daily., Disp: , Rfl:    SQ injection lenacapavir (SUNLENCA ) 463.5 MG/1.5ML SQ injection, Inject 3 mLs (927 mg total) into the skin every 6 (six) months. Administer each injection subcutaneously at separate sites in the abdomen (more or equal to 2 inches from the navel)., Disp: 3 mL, Rfl: 2   tetrahydrozoline 0.05 % ophthalmic solution, Place 1 drop into both eyes daily as needed (red eyes). (Patient not taking: Reported on 02/09/2024), Disp: , Rfl:    triamterene -hydrochlorothiazide  (MAXZIDE -25) 37.5-25 MG tablet, Take 1 tablet by mouth daily., Disp: , Rfl:    Vitamin D , Ergocalciferol , (DRISDOL ) 1.25 MG (50000 UNIT) CAPS capsule, Take 50,000 Units by mouth every 7 (seven) days., Disp: , Rfl:   Current Facility-Administered Medications:    0.9 %  sodium chloride  infusion, 500 mL, Intravenous, Once, Armbruster, Elspeth SQUIBB, MD    Review of Systems     Objective:   Physical Exam        Assessment & Plan:

## 2024-04-13 ENCOUNTER — Other Ambulatory Visit: Payer: Self-pay

## 2024-04-13 ENCOUNTER — Other Ambulatory Visit (HOSPITAL_COMMUNITY): Payer: Self-pay

## 2024-04-13 ENCOUNTER — Other Ambulatory Visit (HOSPITAL_COMMUNITY)
Admission: RE | Admit: 2024-04-13 | Discharge: 2024-04-13 | Disposition: A | Discharge status: Home / Self Care | Source: Ambulatory Visit | Attending: Infectious Disease | Admitting: Infectious Disease

## 2024-04-13 ENCOUNTER — Encounter: Payer: Self-pay | Admitting: Infectious Disease

## 2024-04-13 ENCOUNTER — Ambulatory Visit (INDEPENDENT_AMBULATORY_CARE_PROVIDER_SITE_OTHER): Payer: Self-pay | Admitting: Infectious Disease

## 2024-04-13 VITALS — Ht 70.0 in | Wt 245.0 lb

## 2024-04-13 DIAGNOSIS — E039 Hypothyroidism, unspecified: Secondary | ICD-10-CM

## 2024-04-13 DIAGNOSIS — I10 Essential (primary) hypertension: Secondary | ICD-10-CM | POA: Diagnosis not present

## 2024-04-13 DIAGNOSIS — B20 Human immunodeficiency virus [HIV] disease: Secondary | ICD-10-CM | POA: Diagnosis present

## 2024-04-13 DIAGNOSIS — E079 Disorder of thyroid, unspecified: Secondary | ICD-10-CM | POA: Diagnosis not present

## 2024-04-13 DIAGNOSIS — R109 Unspecified abdominal pain: Secondary | ICD-10-CM | POA: Diagnosis not present

## 2024-04-13 DIAGNOSIS — E782 Mixed hyperlipidemia: Secondary | ICD-10-CM | POA: Diagnosis not present

## 2024-04-13 DIAGNOSIS — Z91199 Patient's noncompliance with other medical treatment and regimen due to unspecified reason: Secondary | ICD-10-CM

## 2024-04-13 MED ORDER — BIKTARVY 50-200-25 MG PO TABS
1.0000 | ORAL_TABLET | Freq: Every day | ORAL | 11 refills | Status: DC
  Filled 2024-04-13: qty 30, 30d supply, fill #0
  Filled 2024-05-06: qty 30, 30d supply, fill #1

## 2024-04-13 NOTE — Progress Notes (Signed)
 Specialty Pharmacy Refill Coordination Note  Laura Jimenez is a 74 y.o. female contacted today regarding refills of specialty medication(s) Bictegravir-Emtricitab-Tenofov (Biktarvy )   Patient requested Courier to Provider Office   Delivery date: 04/15/24   Verified address: [facility] [address] [place] [address]   Medication will be filled on 04/14/24.

## 2024-04-14 ENCOUNTER — Other Ambulatory Visit: Payer: Self-pay

## 2024-04-14 LAB — URINE CYTOLOGY ANCILLARY ONLY
Chlamydia: NEGATIVE
Comment: NEGATIVE
Comment: NORMAL
Neisseria Gonorrhea: NEGATIVE

## 2024-04-14 LAB — T-HELPER CELLS (CD4) COUNT (NOT AT ARMC)
CD4 % Helper T Cell: 13 % — ABNORMAL LOW (ref 33–65)
CD4 T Cell Abs: 297 /uL — ABNORMAL LOW (ref 400–1790)

## 2024-04-15 ENCOUNTER — Telehealth: Payer: Self-pay

## 2024-04-15 DIAGNOSIS — E782 Mixed hyperlipidemia: Secondary | ICD-10-CM | POA: Diagnosis not present

## 2024-04-15 DIAGNOSIS — Z23 Encounter for immunization: Secondary | ICD-10-CM | POA: Diagnosis not present

## 2024-04-15 DIAGNOSIS — Z79899 Other long term (current) drug therapy: Secondary | ICD-10-CM | POA: Diagnosis not present

## 2024-04-15 DIAGNOSIS — I10 Essential (primary) hypertension: Secondary | ICD-10-CM | POA: Diagnosis not present

## 2024-04-15 DIAGNOSIS — Z13220 Encounter for screening for lipoid disorders: Secondary | ICD-10-CM | POA: Diagnosis not present

## 2024-04-15 DIAGNOSIS — Z0000 Encounter for general adult medical examination without abnormal findings: Secondary | ICD-10-CM | POA: Diagnosis not present

## 2024-04-15 DIAGNOSIS — Z136 Encounter for screening for cardiovascular disorders: Secondary | ICD-10-CM | POA: Diagnosis not present

## 2024-04-15 DIAGNOSIS — I700 Atherosclerosis of aorta: Secondary | ICD-10-CM | POA: Diagnosis not present

## 2024-04-15 DIAGNOSIS — M8580 Other specified disorders of bone density and structure, unspecified site: Secondary | ICD-10-CM | POA: Diagnosis not present

## 2024-04-15 NOTE — Telephone Encounter (Signed)
 [facility] Patient Advocate Encounter  Patient's medications Biktarvy  have been couriered to [facility] from [facility] and will be administered picked up.  Arland Hutchinson, CPhT Specialty Pharmacy Patient [facility] for Infectious Disease Phone: [phone] Fax:  [phone]

## 2024-04-17 LAB — HIV RNA, RTPCR W/R GT (RTI, PI,INT)
HIV 1 RNA Quant: <20 {copies}/mL — AB
HIV-1 RNA Quant, Log: <1.3 {Log_copies}/mL — AB

## 2024-04-17 LAB — RPR: RPR Ser Ql: NONREACTIVE

## 2024-04-18 ENCOUNTER — Other Ambulatory Visit (HOSPITAL_COMMUNITY): Payer: Self-pay | Admitting: Family Medicine

## 2024-04-18 DIAGNOSIS — M8580 Other specified disorders of bone density and structure, unspecified site: Secondary | ICD-10-CM

## 2024-04-19 ENCOUNTER — Telehealth: Payer: Self-pay | Admitting: Gastroenterology

## 2024-04-19 NOTE — Telephone Encounter (Signed)
 Thanks for following this up Dottie / Rock agree she really needs to have her blood pressure controlled before she schedules this with us ..  She should see her PCP for further management of blood pressure and they can comment on clearance for her procedures at that time.  After she has seen the PCP she should contact us  for scheduling if they advised her to.  We can do a chart check in 6 weeks or so to see where she is in this process, if he can put a reminder in the system for that.  Thanks

## 2024-04-19 NOTE — Telephone Encounter (Signed)
 Dr Leigh-  I see that patient has a 3 month recall for retry at endoscopic procedures from 03/22/24. She was unable to move forward with procedures on 7/29 due to chest pain and hypertensive emergency.  There is note from the ER that they have added rx for hypertension and she should follow with her PCP. According to Dr Fleeta Dams note 04/13/24, patient's hypertension is still uncontrolled and she still needs follow up with her PCP.   Does she need PCP clearance to move forward with elective outpatient procedure?

## 2024-04-19 NOTE — Telephone Encounter (Signed)
 Inbound call from patient and patient's daughter requesting to know if patient will be rescheduled for endscopy. States patient was not able to go through with procedure due blood pressure being high and having to [facility]. Requesting a call back to patient. Please advise, thank you

## 2024-04-19 NOTE — Telephone Encounter (Signed)
 Dr Leigh please advise if ok to reschedule procedure when you have a chance.

## 2024-04-19 NOTE — Telephone Encounter (Signed)
 I have spoken to patient to advise that she should have follow up with her PCP to get her blood pressure under control. Advised that once PCP feels blood pressure is maintained at a reasonable level, she should provide a clearance note at which time we can move forward with endoscopy scheduling.  She verbalizes understanding and states that she has been following with her PCP and they are working on her blood pressure.

## 2024-04-20 ENCOUNTER — Ambulatory Visit (HOSPITAL_COMMUNITY)
Admission: RE | Admit: 2024-04-20 | Discharge: 2024-04-20 | Disposition: A | Discharge status: Home / Self Care | Source: Ambulatory Visit | Attending: Family Medicine | Admitting: Family Medicine

## 2024-04-20 DIAGNOSIS — Z13820 Encounter for screening for osteoporosis: Secondary | ICD-10-CM | POA: Insufficient documentation

## 2024-04-20 DIAGNOSIS — M8580 Other specified disorders of bone density and structure, unspecified site: Secondary | ICD-10-CM | POA: Insufficient documentation

## 2024-04-20 DIAGNOSIS — M8589 Other specified disorders of bone density and structure, multiple sites: Secondary | ICD-10-CM | POA: Insufficient documentation

## 2024-04-20 DIAGNOSIS — M8588 Other specified disorders of bone density and structure, other site: Secondary | ICD-10-CM | POA: Diagnosis not present

## 2024-05-06 ENCOUNTER — Other Ambulatory Visit: Payer: Self-pay

## 2024-06-08 NOTE — Progress Notes (Signed)
 Office Visit Note  Patient: Laura Jimenez             Date of Birth: [birth year]           MRN: [id number]             PCP: Rolinda Millman, MD Referring: Rolinda Millman, MD Visit Date: 06/22/2024 Occupation: Data Unavailable  Subjective:  Right heel pain  History of Present Illness: Laura Jimenez is a 73 y.o. female with gouty arthropathy, osteoarthritis and osteoporosis.  She returns today after her last visit in May 2024.  She takes colchicine  0.3 mg as needed for gout.  She has been having gout flares up every 4 to 5 months.  It is mostly in the big toe.  Since September she has been having discomfort in her right heel.  She states she takes colchicine  only when she has a flare.  It takes longer for her to recover from the flare.  He has been seeing an orthopedic surgeon for the left shoulder joint pain.  She has been advised to total shoulder replacement.  She denies any lower back pain.  She states that she has had prednisone  taper few times from her PCP.    Activities of Daily Living:  Patient reports morning stiffness for 0 minute.   Patient Reports nocturnal pain.  Difficulty dressing/grooming: Reports Difficulty climbing stairs: Denies Difficulty getting out of chair: Denies Difficulty using hands for taps, buttons, cutlery, and/or writing: Reports  Review of Systems  Constitutional:  Positive for fatigue.  HENT:  Positive for mouth dryness. Negative for mouth sores.   Eyes:  Positive for dryness.  Respiratory:  Negative for shortness of breath.   Cardiovascular:  Positive for chest pain. Negative for palpitations.  Gastrointestinal:  Negative for blood in stool, constipation and diarrhea.  Endocrine: Negative for increased urination.  Genitourinary:  Negative for involuntary urination.  Musculoskeletal:  Positive for joint pain, joint pain and joint swelling. Negative for gait problem, myalgias, muscle weakness, morning stiffness, muscle tenderness and myalgias.   Skin:  Positive for hair loss. Negative for color change, rash and sensitivity to sunlight.  Allergic/Immunologic: Negative for susceptible to infections.  Neurological:  Negative for dizziness and headaches.  Hematological:  Negative for swollen glands.  Psychiatric/Behavioral:  Positive for sleep disturbance. Negative for depressed mood. The patient is not nervous/anxious.     PMFS History:  Patient Active Problem List   Diagnosis Date Noted   Nonadherence to medical treatment 10/25/2023   HTN (hypertension) 02/10/2023   Primary osteoarthritis, right shoulder 08/05/2022   Arthritis of left shoulder region 02/26/2022   Eye swelling 07/31/2021   HIV disease (HCC) 11/21/2020   Postoperative seroma of skin after non-dermatologic procedure 10/09/2020   Right leg swelling 10/09/2020   Acute pain of left shoulder 10/06/2018   Hypothyroidism 10/23/2017   Hyperlipidemia, mixed 08/11/2017   CVA (cerebral vascular accident) (HCC) 08/11/2017   Morbid obesity (HCC) 08/11/2017   Vulvar cancer (HCC) 11/13/2011   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   History of CVA (cerebrovascular accident) 11/13/2011   Post-menopausal bleeding 11/13/2011   HIV INFECTION 09/08/2006   CANDIDIASIS, VAGINAL 09/08/2006   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   Hereditary and idiopathic peripheral neuropathy 09/08/2006   Essential hypertension 09/08/2006   REFLUX ESOPHAGITIS 09/08/2006   OVARIAN CYST 09/08/2006   Dysplasia of cervix 09/08/2006   FOLLICULITIS 09/08/2006   Enlarged lymph nodes 09/08/2006   PAP SMEAR, ABNORMAL 10/09/2000    Past Medical History:  Diagnosis Date  Anxiety    Arthritis    Bell's palsy    left eye   Cancer (HCC)    CVA (cerebral infarction)    Eye swelling 07/31/2021   GERD (gastroesophageal reflux disease)    Gout    HIV infection (HCC)    HTN (hypertension) 02/10/2023   Hyperlipidemia    Hypothyroidism due to Hashimoto's thyroiditis    Morbid obesity with BMI of 40.0-44.9, adult  (HCC)    Nonadherence to medical treatment 10/25/2023   Postmenopausal bleeding    Type 1 diabetes mellitus with hyperglycemia (HCC)     Family History  Problem Relation Age of Onset   Hypertension Mother    Hypertension Sister    Arthritis Sister    Healthy Daughter    Cancer Other    Diabetes Other    Past Surgical History:  Procedure Laterality Date   CATARACT EXTRACTION W/PHACO Left 03/06/2023   Procedure: CATARACT EXTRACTION PHACO AND INTRAOCULAR LENS PLACEMENT (IOC);  Surgeon: Juli Blunt, MD;  Location: [facility];  Service: Ophthalmology;  Laterality: Left;  CDE 4.01   CATARACT EXTRACTION W/PHACO Right 03/20/2023   Procedure: CATARACT EXTRACTION PHACO AND INTRAOCULAR LENS PLACEMENT (IOC);  Surgeon: Juli Blunt, MD;  Location: [facility];  Service: Ophthalmology;  Laterality: Right;  CDE 2.61   COLONOSCOPY  2000   HYSTEROSCOPY WITH D & C N/A 04/19/2013   Procedure: DILATATION AND CURETTAGE /HYSTEROSCOPY;  Surgeon: Harland JAYSON Birkenhead, MD;  Location: [facility];  Service: Gynecology;  Laterality: N/A;   LYMPH NODE BIOPSY Right 09/17/2020   Procedure: LYMPH NODE BIOPSY; INGUINAL;  Surgeon: Kallie Manuelita JAYSON, MD;  Location: [facility];  Service: General;  Laterality: Right;   Social History   Tobacco Use   Smoking status: Former    Current packs/day: 0.00    Average packs/day: 1 pack/day for 5.0 years (5.0 ttl pk-yrs)    Types: Cigarettes    Quit date: 1990    Years since quitting: 35.8    Passive exposure: Never   Smokeless tobacco: Never  Vaping Use   Vaping status: Never Used  Substance Use Topics   Alcohol use: No   Drug use: No   Social History   Social History Narrative   Grew up in [place], [place].   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   2 children, boy and girl. Son is 40s, and special needs.   Daughter in her 80s, is a engineer, civil (consulting).   Eats all food groups.   Wears seatbelt.   Attends church.   Enjoys watching tv, listening to sungard, and spending time  with friends.   Enjoys going out to eat.      Immunization History  Administered Date(s) Administered   Fluad Trivalent(High Dose 65+) 07/13/2023   INFLUENZA, HIGH DOSE SEASONAL PF 05/12/2018, 05/28/2020   Influenza Split 06/08/2010, 05/26/2012, 05/31/2013, 05/29/2014, 05/23/2015   Influenza Whole 05/26/2005   Influenza, Quadrivalent, Recombinant, Inj, Pf 09/03/2016, 05/21/2017   Influenza,inj,Quad PF,6+ Mos 09/03/2016   Influenza-Unspecified 06/08/2010, 05/26/2012, 05/31/2013, 05/29/2014, 05/23/2015, 09/03/2016, 05/21/2017, 05/30/2020, 05/22/2021   Moderna Covid-19 Vaccine Bivalent Booster 48yrs & up 06/19/2021   PFIZER(Purple Top)SARS-COV-2 Vaccination 10/08/2019, 11/01/2019, 07/26/2020   PNEUMOCOCCAL CONJUGATE-20 02/14/2022   PPD Test 06/08/2009   Pfizer(Comirnaty)Fall Seasonal Vaccine 12 years and older 07/13/2023   Pneumococcal Conjugate-13 03/20/2014   Pneumococcal Polysaccharide-23 05/31/2013, 04/27/2019   Pneumococcal-Unspecified 05/31/2013, 03/20/2014   Tdap 03/20/2014, 09/25/2017   Zoster, Live 11/21/2020     Objective: Vital Signs: BP 122/75  Pulse (!) 55   Temp (!) 97.2 F (36.2 C)   Resp 14   Ht 5' 4 (1.626 m)   Wt 249 lb 6.4 oz (113.1 kg)   BMI 42.81 kg/m    Physical Exam Vitals and nursing note reviewed.  Constitutional:      Appearance: She is well-developed.  HENT:     Head: Normocephalic and atraumatic.  Eyes:     Conjunctiva/sclera: Conjunctivae normal.  Cardiovascular:     Rate and Rhythm: Normal rate and regular rhythm.     Heart sounds: Normal heart sounds.  Pulmonary:     Effort: Pulmonary effort is normal.     Breath sounds: Normal breath sounds.  Abdominal:     General: Bowel sounds are normal.     Palpations: Abdomen is soft.  Musculoskeletal:     Cervical back: Normal range of motion.  Lymphadenopathy:     Cervical: No cervical adenopathy.  Skin:    General: Skin is warm and dry.     Capillary Refill: Capillary refill takes less  than 2 seconds.  Neurological:     Mental Status: She is alert and oriented to person, place, and time.  Psychiatric:        Behavior: Behavior normal.      Musculoskeletal Exam: She had good range of motion of the cervical spine.  There was no tenderness over thoracic or lumbar spine.  Left shoulder joint abduction was limited to about 40 degrees.  She had limited internal rotation.  Right shoulder was in full range of motion.  Wrists, MCPs, PIPs and DIPs were in good range of motion.  No synovitis was noted.  Hip joints and knee joints with good range of motion.  She had tenderness over the right posterior calcaneal region without any swelling.  No plantar fasciitis or Achilles tendinitis was noted.  She had no tenderness over MTPs.  CDAI Exam: CDAI Score: -- Patient Global: --; Provider Global: -- Swollen: --; Tender: -- Joint Exam 06/22/2024   No joint exam has been documented for this visit   There is currently no information documented on the homunculus. Go to the Rheumatology activity and complete the homunculus joint exam.  Investigation: No additional findings.  Imaging: No results found.  Recent Labs: Lab Results  Component Value Date   WBC 9.0 03/22/2024   HGB 12.5 03/22/2024   PLT 312 03/22/2024   NA 138 03/22/2024   K 3.8 03/22/2024   CL 105 03/22/2024   CO2 25 03/22/2024   GLUCOSE 103 (H) 03/22/2024   BUN 14 03/22/2024   CREATININE 0.89 03/22/2024   BILITOT 0.8 01/04/2024   ALKPHOS 74 09/11/2021   AST 17 01/04/2024   ALT 10 01/04/2024   PROT 8.0 01/04/2024   ALBUMIN 4.1 09/11/2021   CALCIUM  9.5 03/22/2024   GFRAA 75 01/04/2021   QFTBGOLDPLUS NEGATIVE 06/04/2020    Speciality Comments: No specialty comments available.  Procedures:  No procedures performed Allergies: Gadolinium derivatives, Amlodipine , Ivp dye [iodinated contrast media], Penicillins, Tape, Cephalexin, and Latex   Assessment / Plan:     Visit Diagnoses: Idiopathic chronic gout of  multiple sites without tophus - Symptoms started September 2023.  Patient declined allopurinol at the last visit in April 2024.  Patient is currently not on allopurinol.  She has been taking colchicine  only on as needed basis.  At the last visit we discussed taking colchicine  0.6 mg half tablet daily as she was on rosuvastatin .  I am uncertain if she  is on rosuvastatin .  I advised her to take colchicine  0.6 mg half tablet daily if she is on rosuvastatin  and colchicine  0.6 mg 1 tablet daily if she is not on rosuvastatin .  She may discuss it further with her PCP.  I am uncertain if she is having gout flares or she is having discomfort due to underlying osteoarthritis.  She is not on allopurinol.  We discussed allopurinol in the past and again today which she declined.  She does not want to take allopurinol on a remote basis.  I will check uric acid level today.- Plan: Uric acid  Hyperuricemia-uric acid was 8.6 on December 09, 2022.  Pain in right foot - Patient declined x-rays at the last visit and today.  In the past RF negative, anti-CCP negative, sed rate elevated, uric acid-elevated.  She has tenderness over the right posterior calcaneal region.  I will refer her to podiatry for evaluation.  Chronic left shoulder pain - She is followed by Dr. Harlow who advised total shoulder replacement per patient.  Spondylosis of lumbar spine - MRI 2022.  She complains of chronic discomfort.  Age-related osteoporosis without current pathological fracture - March 06, 2021 DEXA scan :[facility] Spine L1-L2 03/06/2021 68.1 Osteopenia -2.1 0.907 g/cm2 -11.9%.  She gets DEXA scan through her PCP.  Vitamin D  deficiency  Other medical problems are listed as follows:  History of CVA (cerebrovascular accident)  Hyperlipidemia, mixed  MGUS (monoclonal gammopathy of unknown significance) - Followed by Dr. Federico.  Essential hypertension  Vulvar cancer (HCC)  HIV INFECTION - Followed by Dr. Fleeta Rothman  Lymphadenopathy  Acquired hypothyroidism  History of esophageal reflux  Anxiety and depression  Morbid obesity (HCC)  Orders: Orders Placed This Encounter  Procedures   Uric acid   Ambulatory referral to Podiatry   No orders of the defined types were placed in this encounter.    Follow-Up Instructions: Return in about 6 months (around 12/21/2024), or if symptoms worsen or fail to improve, for Osteoarthritis, Osteoporosis, Gout.   Maya Nash, MD  Note - This record has been created using Animal nutritionist.  Chart creation errors have been sought, but may not always  have been located. Such creation errors do not reflect on  the standard of medical care.

## 2024-06-22 ENCOUNTER — Encounter: Payer: Self-pay | Admitting: Rheumatology

## 2024-06-22 ENCOUNTER — Ambulatory Visit: Attending: Rheumatology | Admitting: Rheumatology

## 2024-06-22 VITALS — BP 122/75 | HR 55 | Temp 97.2°F | Resp 14 | Ht 64.0 in | Wt 249.4 lb

## 2024-06-22 DIAGNOSIS — F32A Depression, unspecified: Secondary | ICD-10-CM

## 2024-06-22 DIAGNOSIS — I10 Essential (primary) hypertension: Secondary | ICD-10-CM

## 2024-06-22 DIAGNOSIS — C519 Malignant neoplasm of vulva, unspecified: Secondary | ICD-10-CM

## 2024-06-22 DIAGNOSIS — E559 Vitamin D deficiency, unspecified: Secondary | ICD-10-CM | POA: Diagnosis not present

## 2024-06-22 DIAGNOSIS — M810 Age-related osteoporosis without current pathological fracture: Secondary | ICD-10-CM | POA: Diagnosis not present

## 2024-06-22 DIAGNOSIS — D472 Monoclonal gammopathy: Secondary | ICD-10-CM

## 2024-06-22 DIAGNOSIS — E782 Mixed hyperlipidemia: Secondary | ICD-10-CM | POA: Diagnosis not present

## 2024-06-22 DIAGNOSIS — R591 Generalized enlarged lymph nodes: Secondary | ICD-10-CM

## 2024-06-22 DIAGNOSIS — M1A09X0 Idiopathic chronic gout, multiple sites, without tophus (tophi): Secondary | ICD-10-CM

## 2024-06-22 DIAGNOSIS — E039 Hypothyroidism, unspecified: Secondary | ICD-10-CM

## 2024-06-22 DIAGNOSIS — G8929 Other chronic pain: Secondary | ICD-10-CM

## 2024-06-22 DIAGNOSIS — Z8673 Personal history of transient ischemic attack (TIA), and cerebral infarction without residual deficits: Secondary | ICD-10-CM | POA: Diagnosis not present

## 2024-06-22 DIAGNOSIS — E790 Hyperuricemia without signs of inflammatory arthritis and tophaceous disease: Secondary | ICD-10-CM

## 2024-06-22 DIAGNOSIS — F419 Anxiety disorder, unspecified: Secondary | ICD-10-CM

## 2024-06-22 DIAGNOSIS — R21 Rash and other nonspecific skin eruption: Secondary | ICD-10-CM

## 2024-06-22 DIAGNOSIS — M47816 Spondylosis without myelopathy or radiculopathy, lumbar region: Secondary | ICD-10-CM | POA: Diagnosis not present

## 2024-06-22 DIAGNOSIS — M25512 Pain in left shoulder: Secondary | ICD-10-CM | POA: Diagnosis not present

## 2024-06-22 DIAGNOSIS — B20 Human immunodeficiency virus [HIV] disease: Secondary | ICD-10-CM

## 2024-06-22 DIAGNOSIS — Z8719 Personal history of other diseases of the digestive system: Secondary | ICD-10-CM

## 2024-06-22 DIAGNOSIS — M79671 Pain in right foot: Secondary | ICD-10-CM | POA: Diagnosis not present

## 2024-06-22 NOTE — Patient Instructions (Signed)
 If you are on rosuvastatin  20 mg daily then you should take colchicine  0.6 mg tablet half tablet daily to prevent gout flares. If you are not taking rosuvastatin  then you may take colchicine  0.6 mg daily to prevent gout flares.

## 2024-06-23 ENCOUNTER — Ambulatory Visit: Payer: Self-pay | Admitting: Rheumatology

## 2024-06-23 LAB — URIC ACID: Uric Acid, Serum: 9.3 mg/dL — ABNORMAL HIGH (ref 2.5–7.0)

## 2024-06-23 NOTE — Progress Notes (Signed)
 Uric acid is high at 9.3.  Patient should take colchicine  on a daily basis as discussed during the office visit.  She may also consider going back on allopurinol as prescribed by her PCP in the past.  Please forward results to her PCP.

## 2024-06-28 ENCOUNTER — Encounter: Payer: Self-pay | Admitting: Infectious Disease

## 2024-06-28 ENCOUNTER — Telehealth: Payer: Self-pay

## 2024-06-28 DIAGNOSIS — E785 Hyperlipidemia, unspecified: Secondary | ICD-10-CM

## 2024-06-28 HISTORY — DX: Hyperlipidemia, unspecified: E78.5

## 2024-06-28 NOTE — Telephone Encounter (Signed)
 Per lab note on 06/23/2024: Uric acid is high at 9.3. Patient should take colchicine  on a daily basis as discussed during the office visit. She may also consider going back on allopurinol as prescribed by her PCP in the past.   Patient left a voicemail yesterday asking about both gout medications.   I called the patient back today and patient declines wanting to take both medications daily. She will further discuss with her PCP and GI doctor. She will call us  back if she decides to take the medications.

## 2024-06-28 NOTE — Progress Notes (Unsigned)
 Subjective:  Chief complaint: follow-up for HIV disease on medications   Patient ID: Laura Jimenez, female    DOB: [birth year], 73 y.o.   MRN: [id number]  HPI  Past Medical History:  Diagnosis Date   Anxiety    Arthritis    Bell's palsy    left eye   Cancer (HCC)    CVA (cerebral infarction)    Eye swelling 07/31/2021   GERD (gastroesophageal reflux disease)    Gout    HIV infection (HCC)    HTN (hypertension) 02/10/2023   Hyperlipidemia    Hyperlipidemia 06/28/2024   Hypothyroidism due to Hashimoto's thyroiditis    Morbid obesity with BMI of 40.0-44.9, adult (HCC)    Nonadherence to medical treatment 10/25/2023   Postmenopausal bleeding    Type 1 diabetes mellitus with hyperglycemia [facility])     Past Surgical History:  Procedure Laterality Date   CATARACT EXTRACTION W/PHACO Left 03/06/2023   Procedure: CATARACT EXTRACTION PHACO AND INTRAOCULAR LENS PLACEMENT (IOC);  Surgeon: Juli Blunt, MD;  Location: [facility];  Service: Ophthalmology;  Laterality: Left;  CDE 4.01   CATARACT EXTRACTION W/PHACO Right 03/20/2023   Procedure: CATARACT EXTRACTION PHACO AND INTRAOCULAR LENS PLACEMENT (IOC);  Surgeon: Juli Blunt, MD;  Location: [facility];  Service: Ophthalmology;  Laterality: Right;  CDE 2.61   COLONOSCOPY  2000   HYSTEROSCOPY WITH D & C N/A 04/19/2013   Procedure: DILATATION AND CURETTAGE /HYSTEROSCOPY;  Surgeon: Harland JAYSON Birkenhead, MD;  Location: [facility];  Service: Gynecology;  Laterality: N/A;   LYMPH NODE BIOPSY Right 09/17/2020   Procedure: LYMPH NODE BIOPSY; INGUINAL;  Surgeon: Kallie Manuelita JAYSON, MD;  Location: [facility];  Service: General;  Laterality: Right;    Family History  Problem Relation Age of Onset   Hypertension Mother    Hypertension Sister    Arthritis Sister    Healthy Daughter    Cancer Other    Diabetes Other       Social History   Socioeconomic History   Marital status: Widowed    Spouse name: Not on file   Number of children: Not on file    Years of education: Not on file   Highest education level: 10th grade  Occupational History   Not on file  Tobacco Use   Smoking status: Former    Current packs/day: 0.00    Average packs/day: 1 pack/day for 5.0 years (5.0 ttl pk-yrs)    Types: Cigarettes    Quit date: 28    Years since quitting: 35.8    Passive exposure: Never   Smokeless tobacco: Never  Vaping Use   Vaping status: Never Used  Substance and Sexual Activity   Alcohol use: No   Drug use: No   Sexual activity: Not Currently    Birth control/protection: None, Post-menopausal  Other Topics Concern   Not on file  Social History Narrative   Grew up in [place], [place].   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   2 children, boy and girl. Son is 40s, and special needs.   Daughter in her 63s, is a engineer, civil (consulting).   Eats all food groups.   Wears seatbelt.   Attends church.   Enjoys watching tv, listening to sungard, and spending time with friends.   Enjoys going out to eat.    Social Drivers of Corporate Investment Banker Strain: Low Risk  (06/27/2020)   Overall Financial Resource Strain (CARDIA)    Difficulty of Paying Living  Expenses: Not hard at all  Food Insecurity: No Food Insecurity (06/27/2020)   Hunger Vital Sign    Worried About Running Out of Food in the Last Year: Never true    Ran Out of Food in the Last Year: Never true  Transportation Needs: No Transportation Needs (06/27/2020)   PRAPARE - Administrator, Civil Service (Medical): No    Lack of Transportation (Non-Medical): No  Physical Activity: Sufficiently Active (06/27/2020)   Exercise Vital Sign    Days of Exercise per Week: 7 days    Minutes of Exercise per Session: 60 min  Stress: Stress Concern Present (07/03/2020)   [employer] of Occupational Health - Occupational Stress Questionnaire    Feeling of Stress : To some extent  Social Connections: Moderately Isolated (06/27/2020)   Social Connection and Isolation Panel     Frequency of Communication with Friends and Family: More than three times a week    Frequency of Social Gatherings with Friends and Family: Once a week    Attends Religious Services: More than 4 times per year    Active Member of [employer] or Organizations: No    Attends Banker Meetings: Never    Marital Status: Widowed    Allergies  Allergen Reactions   Gadolinium Derivatives Nausea And Vomiting    Vomited with multihance  12/05/19 Vomited with multihance  12/05/19   Amlodipine  Other (See Comments)    Other Reaction(s): edema, Not available   Ivp Dye [Iodinated Contrast Media] Other (See Comments)    Caused kidney damage   Penicillins Swelling and Other (See Comments)   Tape Dermatitis    Other Reaction(s): skin irritation   Cephalexin Rash   Latex Rash     Current Outpatient Medications:    acetaminophen  (TYLENOL ) 325 MG tablet, as needed., Disp: , Rfl:    ascorbic acid (VITAMIN C) 500 MG tablet, Take 1 tablet by mouth daily., Disp: , Rfl:    aspirin  (PX ENTERIC ASPIRIN ) 81 MG EC tablet, Take 1 tablet (81 mg total) by mouth daily. Swallow whole., Disp: 30 tablet, Rfl: 12   Aspirin -Salicylamide-Caffeine (BC HEADACHE POWDER PO), Take by mouth. (Patient not taking: Reported on 06/22/2024), Disp: , Rfl:    bictegravir-emtricitabine -tenofovir  AF (BIKTARVY ) 50-200-25 MG TABS tablet, Take 1 tablet by mouth daily. (Patient not taking: Reported on 06/22/2024), Disp: 30 tablet, Rfl: 11   brompheniramine-pseudoephedrine-DM 30-2-10 MG/5ML syrup, Take 5 mLs by mouth 4 (four) times daily as needed. (Patient not taking: Reported on 06/22/2024), Disp: 140 mL, Rfl: 0   clobetasol  cream (TEMOVATE ) 0.05 %, Apply 1 Application topically 2 (two) times daily. Apply to affected areas twice daily for up to 2 weeks for flares, Disp: 45 g, Rfl: 0   cloNIDine  (CATAPRES  - DOSED IN MG/24 HR) 0.1 mg/24hr patch, , Disp: , Rfl:    cloNIDine  (CATAPRES ) 0.1 MG tablet, Take 0.1 mg by mouth 2 (two) times  daily., Disp: , Rfl:    colchicine  0.6 MG tablet, Take 0.6 mg by mouth daily as needed., Disp: , Rfl:    Cyanocobalamin  (VITAMIN B12) 1000 MCG TBCR, 1 tablet Orally Once a day; Duration: 30 day(s), Disp: , Rfl:    diclofenac Sodium (VOLTAREN) 1 % GEL, , Disp: , Rfl:    levothyroxine  (SYNTHROID ) 25 MCG tablet, Take 25 mcg by mouth every morning. (Patient not taking: Reported on 06/22/2024), Disp: , Rfl:    metoprolol  succinate (TOPROL -XL) 25 MG 24 hr tablet, Take 25 mg by mouth at bedtime., Disp: ,  Rfl:    mupirocin  ointment (BACTROBAN ) 2 %, Apply 1 Application topically 2 (two) times daily., Disp: 22 g, Rfl: 0   olmesartan  (BENICAR ) 20 MG tablet, Take 1 tablet (20 mg total) by mouth daily., Disp: 30 tablet, Rfl: 2   pantoprazole  (PROTONIX ) 40 MG tablet, Take 1 tablet (40 mg total) by mouth daily., Disp: 90 tablet, Rfl: 1   polyethylene glycol (MIRALAX ) 17 g packet, Take 17 g by mouth daily. (Patient not taking: Reported on 06/22/2024), Disp: , Rfl:    rosuvastatin  (CRESTOR ) 20 MG tablet, Take 20 mg by mouth daily., Disp: , Rfl:    SQ injection lenacapavir (SUNLENCA ) 463.5 MG/1.5ML SQ injection, Inject 3 mLs (927 mg total) into the skin every 6 (six) months. Administer each injection subcutaneously at separate sites in the abdomen (more or equal to 2 inches from the navel). (Patient not taking: Reported on 06/22/2024), Disp: 3 mL, Rfl: 2   tetrahydrozoline 0.05 % ophthalmic solution, Place 1 drop into both eyes daily as needed (red eyes). (Patient not taking: Reported on 06/22/2024), Disp: , Rfl:    TIROSINT  88 MCG CAPS, Take 88 mcg by mouth daily., Disp: , Rfl:    triamterene -hydrochlorothiazide  (MAXZIDE -25) 37.5-25 MG tablet, Take 1 tablet by mouth daily., Disp: , Rfl:    Vitamin D , Ergocalciferol , (DRISDOL ) 1.25 MG (50000 UNIT) CAPS capsule, Take 50,000 Units by mouth every 7 (seven) days., Disp: , Rfl:   Current Facility-Administered Medications:    0.9 %  sodium chloride  infusion, 500 mL,  Intravenous, Once, Armbruster, Elspeth SQUIBB, MD  Review of Systems     Objective:   Physical Exam        Assessment & Plan:

## 2024-06-29 ENCOUNTER — Encounter: Payer: Self-pay | Admitting: Infectious Disease

## 2024-06-29 ENCOUNTER — Ambulatory Visit (INDEPENDENT_AMBULATORY_CARE_PROVIDER_SITE_OTHER): Payer: Self-pay | Admitting: Infectious Disease

## 2024-06-29 ENCOUNTER — Other Ambulatory Visit: Payer: Self-pay

## 2024-06-29 VITALS — BP 148/84 | HR 65 | Temp 97.2°F | Ht 64.0 in | Wt 250.0 lb

## 2024-06-29 DIAGNOSIS — E039 Hypothyroidism, unspecified: Secondary | ICD-10-CM

## 2024-06-29 DIAGNOSIS — I10 Essential (primary) hypertension: Secondary | ICD-10-CM

## 2024-06-29 DIAGNOSIS — E785 Hyperlipidemia, unspecified: Secondary | ICD-10-CM | POA: Diagnosis not present

## 2024-06-29 DIAGNOSIS — B20 Human immunodeficiency virus [HIV] disease: Secondary | ICD-10-CM | POA: Diagnosis not present

## 2024-06-29 DIAGNOSIS — Z23 Encounter for immunization: Secondary | ICD-10-CM

## 2024-06-29 DIAGNOSIS — Z91199 Patient's noncompliance with other medical treatment and regimen due to unspecified reason: Secondary | ICD-10-CM

## 2024-06-29 MED ORDER — BIKTARVY 50-200-25 MG PO TABS: 1.0000 | ORAL_TABLET | Freq: Every day | ORAL | 11 refills | Status: AC

## 2024-06-30 LAB — T-HELPER CELLS (CD4) COUNT (NOT AT ARMC)
CD4 % Helper T Cell: 16 % — ABNORMAL LOW (ref 33–65)
CD4 T Cell Abs: 373 /uL — ABNORMAL LOW (ref 400–1790)

## 2024-07-03 LAB — HIV RNA, RTPCR W/R GT (RTI, PI,INT)
HIV 1 RNA Quant: NOT DETECTED {copies}/mL
HIV-1 RNA Quant, Log: NOT DETECTED {Log_copies}/mL

## 2024-07-05 ENCOUNTER — Ambulatory Visit (INDEPENDENT_AMBULATORY_CARE_PROVIDER_SITE_OTHER): Admitting: Podiatry

## 2024-07-05 ENCOUNTER — Ambulatory Visit (INDEPENDENT_AMBULATORY_CARE_PROVIDER_SITE_OTHER)

## 2024-07-05 VITALS — Ht 64.0 in | Wt 250.0 lb

## 2024-07-05 DIAGNOSIS — M7661 Achilles tendinitis, right leg: Secondary | ICD-10-CM

## 2024-07-05 DIAGNOSIS — M79671 Pain in right foot: Secondary | ICD-10-CM | POA: Diagnosis not present

## 2024-07-05 MED ORDER — PREDNISONE 10 MG PO TABS: ORAL_TABLET | ORAL | 0 refills | Status: AC

## 2024-07-05 NOTE — Patient Instructions (Signed)
 VISIT SUMMARY: Today, you were seen for pain in the back of your heel. We discussed your history of gout and recent use of prednisone . An X-ray was performed, and we reviewed the results together.  YOUR PLAN: -RIGHT ACHILLES TENDINITIS WITH ASSOCIATED CALCANEAL SPUR: You have inflammation at the attachment of your Achilles tendon, which is causing your heel pain. This condition is often due to overuse or injury. The X-ray showed a heel spur, but it is not the main cause of your pain. To help reduce the inflammation, you will start a prednisone  taper beginning at 60 mg and gradually decreasing. You should also follow the home therapy exercises provided and wear a walking boot for one month. Recovery is expected to take about three months.  INSTRUCTIONS: Please follow the prednisone  taper as prescribed: start at 60 mg, then decrease to 50 mg, 40 mg, 30 mg, and finally 20 mg. Perform the home therapy exercises for Achilles tendinitis as instructed. Wear the walking boot for one month. Schedule a follow-up appointment in six weeks.                      Contains text generated by [employer].           Achilles Tendinitis  with Rehab Achilles tendinitis is a disorder of the Achilles tendon. The Achilles tendon connects the large calf muscles (Gastrocnemius and Soleus) to the heel bone (calcaneus). This tendon is sometimes called the heel cord. It is important for pushing-off and standing on your toes and is important for walking, running, or jumping. Tendinitis is often caused by overuse and repetitive microtrauma. SYMPTOMS Pain, tenderness, swelling, warmth, and redness may occur over the Achilles tendon even at rest. Pain with pushing off, or flexing or extending the ankle. Pain that is worsened after or during activity. CAUSES  Overuse sometimes seen with rapid increase in exercise programs or in sports requiring running and jumping. Poor physical conditioning (strength  and flexibility or endurance). Running sports, especially training running down hills. Inadequate warm-up before practice or play or failure to stretch before participation. Injury to the tendon. PREVENTION  Warm up and stretch before practice or competition. Allow time for adequate rest and recovery between practices and competition. Keep up conditioning. Keep up ankle and leg flexibility. Improve or keep muscle strength and endurance. Improve cardiovascular fitness. Use proper technique. Use proper equipment (shoes, skates). To help prevent recurrence, taping, protective strapping, or an adhesive bandage may be recommended for several weeks after healing is complete. PROGNOSIS  Recovery may take weeks to several months to heal. Longer recovery is expected if symptoms have been prolonged. Recovery is usually quicker if the inflammation is due to a direct blow as compared with overuse or sudden strain. RELATED COMPLICATIONS  Healing time will be prolonged if the condition is not correctly treated. The injury must be given plenty of time to heal. Symptoms can reoccur if activity is resumed too soon. Untreated, tendinitis may increase the risk of tendon rupture requiring additional time for recovery and possibly surgery. TREATMENT  The first treatment consists of rest anti-inflammatory medication, and ice to relieve the pain. Stretching and strengthening exercises after resolution of pain will likely help reduce the risk of recurrence. Referral to a physical therapist or athletic trainer for further evaluation and treatment may be helpful. A walking boot or cast may be recommended to rest the Achilles tendon. This can help break the cycle of inflammation and microtrauma. Arch supports (orthotics) may be  prescribed or recommended by your caregiver as an adjunct to therapy and rest. Surgery to remove the inflamed tendon lining or degenerated tendon tissue is rarely necessary and has shown less  than predictable results. MEDICATION  Nonsteroidal anti-inflammatory medications, such as aspirin  and ibuprofen , may be used for pain and inflammation relief. Do not take within 7 days before surgery. Take these as directed by your caregiver. Contact your caregiver immediately if any bleeding, stomach upset, or signs of allergic reaction occur. Other minor pain relievers, such as acetaminophen , may also be used. Pain relievers may be prescribed as necessary by your caregiver. Do not take prescription pain medication for longer than 4 to 7 days. Use only as directed and only as much as you need. Cortisone injections are rarely indicated. Cortisone injections may weaken tendons and predispose to rupture. It is better to give the condition more time to heal than to use them. HEAT AND COLD Cold is used to relieve pain and reduce inflammation for acute and chronic Achilles tendinitis. Cold should be applied for 10 to 15 minutes every 2 to 3 hours for inflammation and pain and immediately after any activity that aggravates your symptoms. Use ice packs or an ice massage. Heat may be used before performing stretching and strengthening activities prescribed by your caregiver. Use a heat pack or a warm soak. SEEK MEDICAL CARE IF: Symptoms get worse or do not improve in 2 weeks despite treatment. New, unexplained symptoms develop. Drugs used in treatment may produce side effects.  EXERCISES:  RANGE OF MOTION (ROM) AND STRETCHING EXERCISES - Achilles Tendinitis  These exercises may help you when beginning to rehabilitate your injury. Your symptoms may resolve with or without further involvement from your physician, physical therapist or athletic trainer. While completing these exercises, remember:  Restoring tissue flexibility helps normal motion to return to the joints. This allows healthier, less painful movement and activity. An effective stretch should be held for at least 30 seconds. A stretch should  never be painful. You should only feel a gentle lengthening or release in the stretched tissue.  STRETCH  Gastroc, Standing  Place hands on wall. Extend right / left leg, keeping the front knee somewhat bent. Slightly point your toes inward on your back foot. Keeping your right / left heel on the floor and your knee straight, shift your weight toward the wall, not allowing your back to arch. You should feel a gentle stretch in the right / left calf. Hold this position for 10 seconds. Repeat 3 times. Complete this stretch 2 times per day.  STRETCH  Soleus, Standing  Place hands on wall. Extend right / left leg, keeping the other knee somewhat bent. Slightly point your toes inward on your back foot. Keep your right / left heel on the floor, bend your back knee, and slightly shift your weight over the back leg so that you feel a gentle stretch deep in your back calf. Hold this position for 10 seconds. Repeat 3 times. Complete this stretch 2 times per day.  STRETCH  Gastrocsoleus, Standing  Note: This exercise can place a lot of stress on your foot and ankle. Please complete this exercise only if specifically instructed by your caregiver.  Place the ball of your right / left foot on a step, keeping your other foot firmly on the same step. Hold on to the wall or a rail for balance. Slowly lift your other foot, allowing your body weight to press your heel down over the edge  of the step. You should feel a stretch in your right / left calf. Hold this position for 10 seconds. Repeat this exercise with a slight bend in your knee. Repeat 3 times. Complete this stretch 2 times per day.   STRENGTHENING EXERCISES - Achilles Tendinitis These exercises may help you when beginning to rehabilitate your injury. They may resolve your symptoms with or without further involvement from your physician, physical therapist or athletic trainer. While completing these exercises, remember:  Muscles can gain both  the endurance and the strength needed for everyday activities through controlled exercises. Complete these exercises as instructed by your physician, physical therapist or athletic trainer. Progress the resistance and repetitions only as guided. You may experience muscle soreness or fatigue, but the pain or discomfort you are trying to eliminate should never worsen during these exercises. If this pain does worsen, stop and make certain you are following the directions exactly. If the pain is still present after adjustments, discontinue the exercise until you can discuss the trouble with your clinician.  STRENGTH - Plantar-flexors  Sit with your right / left leg extended. Holding onto both ends of a rubber exercise band/tubing, loop it around the ball of your foot. Keep a slight tension in the band. Slowly push your toes away from you, pointing them downward. Hold this position for 10 seconds. Return slowly, controlling the tension in the band/tubing. Repeat 3 times. Complete this exercise 2 times per day.   STRENGTH - Plantar-flexors  Stand with your feet shoulder width apart. Steady yourself with a wall or table using as little support as needed. Keeping your weight evenly spread over the width of your feet, rise up on your toes.* Hold this position for 10 seconds. Repeat 3 times. Complete this exercise 2 times per day.  *If this is too easy, shift your weight toward your right / left leg until you feel challenged. Ultimately, you may be asked to do this exercise with your right / left foot only.  STRENGTH  Plantar-flexors, Eccentric  Note: This exercise can place a lot of stress on your foot and ankle. Please complete this exercise only if specifically instructed by your caregiver.  Place the balls of your feet on a step. With your hands, use only enough support from a wall or rail to keep your balance. Keep your knees straight and rise up on your toes. Slowly shift your weight entirely to  your right / left toes and pick up your opposite foot. Gently and with controlled movement, lower your weight through your right / left foot so that your heel drops below the level of the step. You will feel a slight stretch in the back of your calf at the end position. Use the healthy leg to help rise up onto the balls of both feet, then lower weight only on the right / left leg again. Build up to 15 repetitions. Then progress to 3 consecutive sets of 15 repetitions.* After completing the above exercise, complete the same exercise with a slight knee bend (about 30 degrees). Again, build up to 15 repetitions. Then progress to 3 consecutive sets of 15 repetitions.* Perform this exercise 2 times per day.  *When you easily complete 3 sets of 15, your physician, physical therapist or athletic trainer may advise you to add resistance by wearing a backpack filled with additional weight.  STRENGTH - Plantar Flexors, Seated  Sit on a chair that allows your feet to rest flat on the ground. If necessary, sit  at the edge of the chair. Keeping your toes firmly on the ground, lift your right / left heel as far as you can without increasing any discomfort in your ankle. Repeat 3 times. Complete this exercise 2 times a day.

## 2024-07-08 ENCOUNTER — Encounter: Payer: Self-pay | Admitting: Podiatry

## 2024-07-08 NOTE — Progress Notes (Signed)
  Subjective:  Patient ID: Laura Jimenez, female    DOB: [birth year],  MRN: [id number]  Chief Complaint  Patient presents with   Foot Pain     Rm 4 new patient-right heel/foot pain x 2 weeks. Pt states pain is a throbbing pain in the heel. Pt states pain is a 10 on a scale of 1/10. Pt states taking prednisone  and colchicine  for gout with no relief.    Discussed the use of AI scribe software for clinical note transcription with the patient, who gave verbal consent to proceed.  History of Present Illness Laura Jimenez is a 73 year old female with a history of gout who presents with heel pain.  She experiences pain in the back of her heel, initially perceived as a gouge. The pain was significant but has improved today. It is located at the back of the heel, not on the bottom.  She has a history of gout and was taking prednisone  for three days, although she did not start it immediately due to limited supply. She recalls taking either 10 or 20 mg doses. The prednisone  eventually helped alleviate the pain enough for her to drive.  No diabetes. She mentions that her feet tend to swell, especially when wearing stockings.      Objective:    Physical Exam VASCULAR: DP and PT pulse palpable. Foot is warm and well-perfused. Capillary fill time is brisk. DERMATOLOGIC: Normal skin turgor, texture, and temperature. No open lesions, rashes, or ulcerations. NEUROLOGIC: Normal sensation to light touch and pressure. No paresthesias. ORTHOPEDIC: Pain at posterior heel at Achilles tendon insertion. No pain on right plantar heel. Smooth pain-free range of motion of all examined joints. No ecchymosis or bruising. No gross deformity. No pain to palpation.   No images are attached to the encounter.    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 No fracture or stress fracture. (07/05/2024)   Assessment:   1. Achilles tendinitis, right leg       Plan:  Patient was evaluated and treated and all questions answered.  Assessment and Plan Assessment & Plan Right Achilles tendinitis with associated calcaneal spur Right Achilles tendinitis with associated calcaneal spur, presenting with pain at the insertion of the Achilles tendon on the posterior heel. X-ray reveals a posterior heel spur and plantar heel spur, with no fracture or stress fracture. The spur is not the primary cause of pain, which is attributed to inflammation at the tendon attachment. The condition is likely due to overuse or injury. Surgery is not indicated as the spur has been present longer than the pain. Recovery is expected to take approximately three months. - Prescribed prednisone  taper starting at 60 mg, then 50 mg, 40 mg, 30 mg, and 20 mg to control inflammation. - Provided a home therapy plan with exercises for Achilles tendinitis. - Recommended wearing a Cam walker boot for 1 month.  This was dispensed today to provide soft tissue mobilization and facilitate reduction of inflammation and healing. - Scheduled follow-up appointment in six weeks.      No follow-ups on file.

## 2024-07-13 ENCOUNTER — Ambulatory Visit (INDEPENDENT_AMBULATORY_CARE_PROVIDER_SITE_OTHER): Admitting: Cardiovascular Disease

## 2024-07-13 ENCOUNTER — Encounter (HOSPITAL_BASED_OUTPATIENT_CLINIC_OR_DEPARTMENT_OTHER): Payer: Self-pay | Admitting: Cardiovascular Disease

## 2024-07-13 VITALS — BP 154/72 | HR 51 | Ht 64.0 in | Wt 254.7 lb

## 2024-07-13 DIAGNOSIS — E782 Mixed hyperlipidemia: Secondary | ICD-10-CM

## 2024-07-13 DIAGNOSIS — E039 Hypothyroidism, unspecified: Secondary | ICD-10-CM

## 2024-07-13 DIAGNOSIS — I639 Cerebral infarction, unspecified: Secondary | ICD-10-CM | POA: Diagnosis not present

## 2024-07-13 DIAGNOSIS — I10 Essential (primary) hypertension: Secondary | ICD-10-CM | POA: Diagnosis not present

## 2024-07-13 DIAGNOSIS — Z5181 Encounter for therapeutic drug level monitoring: Secondary | ICD-10-CM

## 2024-07-13 MED ORDER — SPIRONOLACTONE 25 MG PO TABS: 25.0000 mg | ORAL_TABLET | Freq: Every day | ORAL | 3 refills | Status: AC

## 2024-07-13 NOTE — Progress Notes (Signed)
 Advanced Hypertension Clinic Initial Assessment:    Date:  07/13/2024   ID:  Laura Jimenez, DOB [birth year], MRN [id number]  PCP:  Rolinda Millman, MD  Cardiologist:  None  Nephrologist:  Referring MD: Rolinda Millman, MD   CC: Hypertension  History of Present Illness:    Laura Jimenez is a 73 y.o. female with a hx of prior stroke, aortic atherosclerosis, hypertension, hyperlipidemia, HIV, and morbid obesity here to establish care in the Advanced Hypertension Clinic.  She has a longstanding history of hypertension and has had intolerances to multiple medications.  She is also struggled with nonadherence to medications.  At her visit with her primary care doctor she brought a log of her blood pressures ranging from 105-170 systolic.  At that visit her blood pressure was uncontrolled on HCTZ/triamterene  and clonidine .  Her clonidine  was increased and he was referred to the advanced hypertension clinic.  She has declined cholesterol medications.  Discussed the use of AI scribe software for clinical note transcription with the patient, who gave verbal consent to proceed.  History of Present Illness Ms. Randol has a history of hypertension for over 30 years, which has become more difficult to control recently. Her blood pressure has been fluctuating, with recent home readings around 121 mmHg, but it was elevated to 240 mmHg during a recent gastroenterology visit, leading to the cancellation of her procedure. She takes her blood pressure medications at night and in the morning, but notes that her blood pressure is often high upon waking, ranging from 160 to 190 mmHg.  Her current medication regimen includes hydrochlorothiazide  and triamterene , olmesartan , clonidine , and metoprolol . She recently started olmesartan  and takes clonidine  twice daily, but discontinued the clonidine  patch due to skin irritation. She is not currently taking rosuvastatin  for cholesterol management.  She engages in  physical activity by walking around her neighborhood and working in her yard, typically walking three times a week for about ten minutes each time. She feels good during these walks but avoids walking too far due to past experiences of fatigue. Her diet primarily consists of home-cooked meals with minimal salt intake, and she drinks decaffeinated beverages. She rarely consumes alcohol and quit smoking in her twenties.  She has a history of gout, particularly affecting her feet, and experiences heel pain, which is not attributed to gout but possibly a muscle issue. She avoids using her shoulder excessively due to pain from a condition requiring replacement.  No snoring and she feels rested upon waking. She experiences swelling in her legs, particularly where she had gout.   Previous antihypertensives: Amlodipine     Past Medical History:  Diagnosis Date   Anxiety    Arthritis    Bell's palsy    left eye   Cancer (HCC)    CVA (cerebral infarction)    Eye swelling 07/31/2021   GERD (gastroesophageal reflux disease)    Gout    HIV infection (HCC)    HTN (hypertension) 02/10/2023   Hyperlipidemia    Hyperlipidemia 06/28/2024   Hypothyroidism due to Hashimoto's thyroiditis    Morbid obesity with BMI of 40.0-44.9, adult (HCC)    Nonadherence to medical treatment 10/25/2023   Postmenopausal bleeding    Type 1 diabetes mellitus with hyperglycemia [facility])     Past Surgical History:  Procedure Laterality Date   CATARACT EXTRACTION W/PHACO Left 03/06/2023   Procedure: CATARACT EXTRACTION PHACO AND INTRAOCULAR LENS PLACEMENT (IOC);  Surgeon: Juli Blunt, MD;  Location: [facility];  Service: Ophthalmology;  Laterality:  Left;  CDE 4.01   CATARACT EXTRACTION W/PHACO Right 03/20/2023   Procedure: CATARACT EXTRACTION PHACO AND INTRAOCULAR LENS PLACEMENT (IOC);  Surgeon: Juli Blunt, MD;  Location: [facility];  Service: Ophthalmology;  Laterality: Right;  CDE 2.61   COLONOSCOPY  2000    HYSTEROSCOPY WITH D & C N/A 04/19/2013   Procedure: DILATATION AND CURETTAGE /HYSTEROSCOPY;  Surgeon: Harland JAYSON Birkenhead, MD;  Location: [facility];  Service: Gynecology;  Laterality: N/A;   LYMPH NODE BIOPSY Right 09/17/2020   Procedure: LYMPH NODE BIOPSY; INGUINAL;  Surgeon: Kallie Manuelita JAYSON, MD;  Location: [facility];  Service: General;  Laterality: Right;    Current Medications: Current Meds  Medication Sig   AREXVY 120 MCG/0.5ML injection    spironolactone (ALDACTONE) 25 MG tablet Take 1 tablet (25 mg total) by mouth daily.   Current Facility-Administered Medications for the 07/13/24 encounter (Office Visit) with Raford Riggs, MD  Medication   0.9 %  sodium chloride  infusion     Allergies:   Gadolinium derivatives, Amlodipine , Emtricitabine , Emtricitabine -tenofovir  af, Ivp dye [iodinated contrast media], Levothyroxine , Penicillins, Tape, Cephalexin, and Latex   Social History   Socioeconomic History   Marital status: Widowed    Spouse name: Not on file   Number of children: Not on file   Years of education: Not on file   Highest education level: 10th grade  Occupational History   Not on file  Tobacco Use   Smoking status: Former    Current packs/day: 0.00    Average packs/day: 1 pack/day for 5.0 years (5.0 ttl pk-yrs)    Types: Cigarettes    Quit date: 81    Years since quitting: 35.9    Passive exposure: Never   Smokeless tobacco: Never  Vaping Use   Vaping status: Never Used  Substance and Sexual Activity   Alcohol use: No   Drug use: No   Sexual activity: Not Currently    Birth control/protection: None, Post-menopausal  Other Topics Concern   Not on file  Social History Narrative   Grew up in [place], [place].   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   2 children, boy and girl. Son is 40s, and special needs.   Daughter in her 15s, is a engineer, civil (consulting).   Eats all food groups.   Wears seatbelt.   Attends church.   Enjoys watching tv, listening to sungard,  and spending time with friends.   Enjoys going out to eat.    Social Drivers of Corporate Investment Banker Strain: Low Risk  (07/13/2024)   Overall Financial Resource Strain (CARDIA)    Difficulty of Paying Living Expenses: Not hard at all  Food Insecurity: Food Insecurity Present (07/13/2024)   Hunger Vital Sign    Worried About Running Out of Food in the Last Year: Never true    Ran Out of Food in the Last Year: Sometimes true  Transportation Needs: No Transportation Needs (07/13/2024)   PRAPARE - Administrator, Civil Service (Medical): No    Lack of Transportation (Non-Medical): No  Physical Activity: Insufficiently Active (07/13/2024)   Exercise Vital Sign    Days of Exercise per Week: 7 days    Minutes of Exercise per Session: 20 min  Stress: No Stress Concern Present (07/13/2024)   [employer] of Occupational Health - Occupational Stress Questionnaire    Feeling of Stress: Not at all  Social Connections: Moderately Isolated (07/13/2024)   Social Connection and Isolation Panel  Frequency of Communication with Friends and Family: More than three times a week    Frequency of Social Gatherings with Friends and Family: Once a week    Attends Religious Services: More than 4 times per year    Active Member of [employer] or Organizations: No    Attends Engineer, Structural: Never    Marital Status: Never married     Family History: The patient's family history includes Arthritis in her sister; Cancer in an other family member; Diabetes in an other family member; Healthy in her daughter; Hypertension in her mother and sister.  ROS:   Please see the history of present illness.     All other systems reviewed and are negative.  EKGs/Labs/Other Studies Reviewed:    EKG:  EKG is  ordered today.    EKG Interpretation Date/Time:  Wednesday July 13 2024 10:23:33 EST Ventricular Rate:  51 PR Interval:  222 QRS Duration:  92 QT Interval:  420 QTC  Calculation: 387 R Axis:   -11  Text Interpretation: Sinus bradycardia with 1st degree A-V block Minimal voltage criteria for LVH, may be normal variant ( R in aVL ) Nonspecific T wave abnormality Since last tracing Rate slower Confirmed by Raford Riggs ([id number]) on 07/13/2024 11:03:33 AM         Recent Labs: 01/04/2024: ALT 10 03/22/2024: BUN 14; Creatinine, Ser 0.89; Hemoglobin 12.5; Platelets 312; Potassium 3.8; Sodium 138   Recent Lipid Panel    Component Value Date/Time   CHOL 216 (H) 01/04/2024 1552   TRIG 143 01/04/2024 1552   HDL 49 (L) 01/04/2024 1552   CHOLHDL 4.4 01/04/2024 1552   LDLCALC 140 (H) 01/04/2024 1552   04/16/2024: Total cholesterol 235, triglycerides 125, HDL 46, LDL 166  Physical Exam:   VS:  BP (!) 154/72   Pulse (!) 51   Ht 5' 4 (1.626 m)   Wt 254 lb 11.2 oz (115.5 kg)   SpO2 95%   BMI 43.72 kg/m  , BMI Body mass index is 43.72 kg/m. GENERAL:  Well appearing HEENT: Pupils equal round and reactive, fundi not visualized, oral mucosa unremarkable NECK:  No jugular venous distention, waveform within normal limits, carotid upstroke brisk and symmetric, no bruits, no thyromegaly LUNGS:  Clear to auscultation bilaterally HEART:  RRR.  PMI not displaced or sustained,S1 and S2 within normal limits, no S3, no S4, no clicks, no rubs, no murmurs ABD:  Obese. Positive bowel sounds normal in frequency in pitch, no bruits, no rebound, no guarding, no midline pulsatile mass, no hepatomegaly, no splenomegaly EXT:  2 plus pulses throughout, no edema, no cyanosis no clubbing SKIN:  No rashes no nodules NEURO:  Cranial nerves II through XII grossly intact, motor grossly intact throughout PSYCH:  Cognitively intact, oriented to person place and time   ASSESSMENT/PLAN:    Assessment & Plan # Primary hypertension: Hypertension control has been challenging with current regimen. Clonidine  caused drowsiness. Home BP readings improved but morning spikes persist.  Spironolactone considered to replace clonidine . - Discontinued clonidine . - Initiated spironolactone 25 mg once daily in the morning. - Instructed to monitor blood pressure twice daily and record readings. - Will order BMP in one week to assess stability. - Provided educational materials on hypertension management. - Encouraged increasing exercise to 150 minutes per week. - Provided list of resources for healthy food options.  # Hypothyroidism: Thyroid  levels are not optimized.  She is seeking referral to endocrine.  - Referred to endocrinology for thyroid  management.  #  Hyperlipidemia;  Uncontrolled.  She is not taking rosuvastatin  as prescribed.  Encouraged her to take and repeat labs at follow up.  Increase exercise as above.    Screening for Secondary Hypertension:     07/13/2024   11:11 AM  Causes  Drugs/Herbals Screened     - Comments rare EtOH.  Limits salt.  Cooks at home.  Rare caffeine.  Renovascular HTN Screened     - Comments check renal Dopplers  Sleep Apnea Screened     - Comments no symtopoms  Thyroid  Disease Screened     - Comments unregulated.  Follows with endocrine.  TSH 11 12/2023  Hyperaldosteronism Screened     - Comments check renin/aldo  Pheochromocytoma Screened  Cushing's Syndrome Screened     - Comments check cortisol  Hyperparathyroidism Screened  Coarctation of the Aorta Screened     - Comments BP symmetric  Compliance Screened    Relevant Labs/Studies:    Latest Ref Rng & Units 03/22/2024   11:10 AM 01/04/2024    3:52 PM 10/26/2023   11:29 AM  Basic Labs  Sodium 135 - 145 mmol/L 138  140  140   Potassium 3.5 - 5.1 mmol/L 3.8  3.7  3.8   Creatinine 0.44 - 1.00 mg/dL 9.10  9.12  9.13        Latest Ref Rng & Units 06/10/2021    2:19 PM 04/09/2018   11:49 AM  Thyroid    TSH 0.308 - 3.960 uIU/mL 69.289  3.51       Disposition:    FU with MD/PharmD in 2 month    Medication Adjustments/Labs and Tests Ordered: Current medicines are  reviewed at length with the patient today.  Concerns regarding medicines are outlined above.  Orders Placed This Encounter  Procedures   Basic metabolic panel with GFR   Ambulatory referral to Endocrinology   EKG 12-Lead   Meds ordered this encounter  Medications   spironolactone  (ALDACTONE ) 25 MG tablet    Sig: Take 1 tablet (25 mg total) by mouth daily.    Dispense:  90 tablet    Refill:  3    D/C CLONIDINE      Signed, Annabella Scarce, MD  07/13/2024 12:39 PM    [facility]

## 2024-07-13 NOTE — Patient Instructions (Addendum)
 Medication Instructions:  STOP CLONIDINE    START SPIRONOLACTONE  25 MG DAILY   Labwork: BMET IN 1 WEEK   Testing/Procedures: NONE  Follow-Up: IN JANUARY OR FEBRUARY WITH CAITLIN NP OR DR Cairo   You have been referred to ENDOCRINOLOGY    If you need a refill on your cardiac medications before your next appointment, please call your pharmacy.   Gout  Gout is painful swelling of your joints. Gout is a type of arthritis. It is caused by having too much uric acid in your body. Uric acid is a chemical that is made when your body breaks down substances called purines. If your body has too much uric acid, sharp crystals can form and build up in your joints. This causes pain and swelling. Gout attacks can happen quickly and be very painful (acute gout). Over time, the attacks can affect more joints and happen more often (chronic gout). What are the causes? Gout is caused by too much uric acid in your blood. This can happen because: Your kidneys do not remove enough uric acid from your blood. Your body makes too much uric acid. You eat too many foods that are high in purines. These foods include organ meats, some seafood, and beer. Trauma or stress can bring on an attack. What increases the risk? Having a family history of gout. Being female and middle-aged. Being female and having gone through menopause. Having an organ transplant. Taking certain medicines. Having certain conditions, such as: Being very overweight (obese). Lead poisoning. Kidney disease. A skin condition called psoriasis. Other risks include: Losing weight too quickly. Not having enough water  in the body (being dehydrated). Drinking alcohol, especially beer. Drinking beverages that are sweetened with a type of sugar called fructose. What are the signs or symptoms? An attack of acute gout often starts at night and usually happens in just one joint. The most common place is the big toe. Other joints that may be  affected include joints of the feet, ankle, knee, fingers, wrist, or elbow. Symptoms may include: Very bad pain. Warmth. Swelling. Stiffness. Tenderness. The affected joint may be very painful to touch. Shiny, red, or purple skin. Chills and fever. Chronic gout may cause symptoms more often. More joints may be involved. You may also have white or yellow lumps (tophi) on your hands or feet or in other areas near your joints. How is this treated? Treatment for an acute attack may include medicines for pain and swelling, such as: NSAIDs, such as ibuprofen . Steroids taken by mouth or injected into a joint. Colchicine . This can be given by mouth or through an IV tube. Treatment to prevent future attacks may include: Taking small doses of NSAIDs or colchicine  daily. Using a medicine that reduces uric acid levels in your blood, such as allopurinol. Making changes to your diet. You may need to see a food expert (dietitian) about what to eat and drink to prevent gout. Follow these instructions at home: During a gout attack  If told, put ice on the painful area. To do this: Put ice in a plastic bag. Place a towel between your skin and the bag. Leave the ice on for 20 minutes, 2-3 times a day. Take off the ice if your skin turns bright red. This is very important. If you cannot feel pain, heat, or cold, you have a greater risk of damage to the area. Raise the painful joint above the level of your heart as often as you can. Rest the joint as much  as possible. If the joint is in your leg, you may be given crutches. Follow instructions from your doctor about what you cannot eat or drink. Avoiding future gout attacks Eat a low-purine diet. Avoid foods and drinks such as: Liver. Kidney. Anchovies. Asparagus. Herring. Mushrooms. Mussels. Beer. Stay at a healthy weight. If you want to lose weight, talk with your doctor. Do not lose weight too fast. Start or continue an exercise plan as told by  your doctor. Eating and drinking Avoid drinks sweetened by fructose. Drink enough fluids to keep your pee (urine) pale yellow. If you drink alcohol: Limit how much you have to: 0-1 drink a day for women who are not pregnant. 0-2 drinks a day for men. Know how much alcohol is in a drink. In the [place] one drink equals one 12 oz bottle of beer (355 mL), one 5 oz glass of wine (148 mL), or one 1 oz glass of hard liquor (44 mL). General instructions Take over-the-counter and prescription medicines only as told by your doctor. Ask your doctor if you should avoid driving or using machines while you are taking your medicine. Return to your normal activities when your doctor says that it is safe. Keep all follow-up visits. Where to find more information [employer] of Health: www.niams.http://www.myers.net/ Contact a doctor if: You have another gout attack. You still have symptoms of a gout attack after 10 days of treatment. You have problems (side effects) because of your medicines. You have chills or a fever. You have burning pain when you pee (urinate). You have pain in your lower back or belly. Get help right away if: You have very bad pain. Your pain cannot be controlled. You cannot pee. Summary Gout is painful swelling of the joints. The most common site of pain is the big toe, but it can affect other joints. Medicines and avoiding some foods can help to prevent and treat gout attacks. This information is not intended to replace advice given to you by your health care provider. Make sure you discuss any questions you have with your health care provider. Document Revised: 05/15/2021 Document Reviewed: 05/15/2021 Elsevier Patient Education  2024 [employer].

## 2024-07-29 ENCOUNTER — Encounter: Payer: Self-pay | Admitting: Gastroenterology

## 2024-08-09 ENCOUNTER — Other Ambulatory Visit: Payer: Self-pay

## 2024-08-10 ENCOUNTER — Ambulatory Visit: Payer: Self-pay | Admitting: Infectious Disease

## 2024-08-12 ENCOUNTER — Other Ambulatory Visit: Payer: Self-pay

## 2024-08-16 ENCOUNTER — Ambulatory Visit: Admitting: Podiatry

## 2024-08-16 ENCOUNTER — Other Ambulatory Visit: Payer: Self-pay

## 2024-08-23 ENCOUNTER — Other Ambulatory Visit: Payer: Self-pay

## 2024-08-23 NOTE — Progress Notes (Signed)
 Sunlenca  was discontinued per patient request (see notes from OV on 8/20), and Biktarvy  now being filled at [facility]. Dis-enrolled.

## 2024-10-05 ENCOUNTER — Ambulatory Visit: Admitting: Infectious Disease

## 2024-10-14 ENCOUNTER — Encounter (HOSPITAL_BASED_OUTPATIENT_CLINIC_OR_DEPARTMENT_OTHER): Admitting: Cardiovascular Disease

## 2024-12-14 ENCOUNTER — Ambulatory Visit: Admitting: Rheumatology

## 2025-01-24 ENCOUNTER — Ambulatory Visit: Admitting: "Endocrinology

## 2025-01-26 ENCOUNTER — Ambulatory Visit: Admitting: "Endocrinology
